# Patient Record
Sex: Female | Born: 1955
Health system: Southern US, Community
[De-identification: ages and names within clinical notes are randomized; demographics above are authoritative.]

## PROBLEM LIST (undated history)

## (undated) DIAGNOSIS — I10 Essential (primary) hypertension: Secondary | ICD-10-CM

## (undated) DIAGNOSIS — J45909 Unspecified asthma, uncomplicated: Secondary | ICD-10-CM

## (undated) DIAGNOSIS — G40909 Epilepsy, unspecified, not intractable, without status epilepticus: Secondary | ICD-10-CM

## (undated) DIAGNOSIS — E119 Type 2 diabetes mellitus without complications: Secondary | ICD-10-CM

## (undated) DIAGNOSIS — G4730 Sleep apnea, unspecified: Secondary | ICD-10-CM

## (undated) HISTORY — DX: Essential (primary) hypertension: I10

## (undated) HISTORY — DX: Type 2 diabetes mellitus without complications: E11.9

## (undated) HISTORY — DX: Sleep apnea, unspecified: G47.30

## (undated) HISTORY — DX: Epilepsy, unspecified, not intractable, without status epilepticus: G40.909

## (undated) HISTORY — DX: Unspecified asthma, uncomplicated: J45.909

## (undated) HISTORY — PX: CHOLECYSTECTOMY: SHX55

## (undated) HISTORY — PX: TONSILLECTOMY: SUR1361

## (undated) HISTORY — PX: APPENDECTOMY: SHX54

## (undated) MED FILL — Iron Sucrose Inj 20 MG/ML (Fe Equiv): INTRAVENOUS | Qty: 10 | Status: AC

---

## 2008-08-22 ENCOUNTER — Ambulatory Visit: Payer: Self-pay | Admitting: Vascular Surgery

## 2013-01-05 DIAGNOSIS — G40309 Generalized idiopathic epilepsy and epileptic syndromes, not intractable, without status epilepticus: Secondary | ICD-10-CM | POA: Insufficient documentation

## 2013-01-05 DIAGNOSIS — G40219 Localization-related (focal) (partial) symptomatic epilepsy and epileptic syndromes with complex partial seizures, intractable, without status epilepticus: Secondary | ICD-10-CM | POA: Insufficient documentation

## 2016-01-06 DIAGNOSIS — E119 Type 2 diabetes mellitus without complications: Secondary | ICD-10-CM | POA: Insufficient documentation

## 2016-01-06 DIAGNOSIS — E1165 Type 2 diabetes mellitus with hyperglycemia: Secondary | ICD-10-CM | POA: Insufficient documentation

## 2016-02-13 DIAGNOSIS — R001 Bradycardia, unspecified: Secondary | ICD-10-CM | POA: Insufficient documentation

## 2016-07-08 ENCOUNTER — Ambulatory Visit: Payer: Self-pay | Admitting: Allergy and Immunology

## 2016-07-10 ENCOUNTER — Encounter: Payer: Self-pay | Admitting: Allergy and Immunology

## 2016-07-10 ENCOUNTER — Ambulatory Visit (INDEPENDENT_AMBULATORY_CARE_PROVIDER_SITE_OTHER): Payer: Medicare HMO | Admitting: Allergy and Immunology

## 2016-07-10 VITALS — BP 150/98 | HR 76 | Temp 98.5°F | Resp 16 | Ht 65.7 in | Wt 244.0 lb

## 2016-07-10 DIAGNOSIS — K219 Gastro-esophageal reflux disease without esophagitis: Secondary | ICD-10-CM

## 2016-07-10 DIAGNOSIS — G4733 Obstructive sleep apnea (adult) (pediatric): Secondary | ICD-10-CM | POA: Diagnosis not present

## 2016-07-10 DIAGNOSIS — D721 Eosinophilia, unspecified: Secondary | ICD-10-CM

## 2016-07-10 DIAGNOSIS — J324 Chronic pansinusitis: Secondary | ICD-10-CM | POA: Diagnosis not present

## 2016-07-10 DIAGNOSIS — J3089 Other allergic rhinitis: Secondary | ICD-10-CM | POA: Diagnosis not present

## 2016-07-10 MED ORDER — MONTELUKAST SODIUM 10 MG PO TABS
ORAL_TABLET | ORAL | 5 refills | Status: DC
Start: 2016-07-10 — End: 2017-01-05

## 2016-07-10 MED ORDER — RANITIDINE HCL 300 MG PO TABS: ORAL_TABLET | ORAL | 5 refills | Status: DC

## 2016-07-10 NOTE — Progress Notes (Signed)
Dear Dr. Theda Sers,  Thank you for referring Alicia Kelly to the [facility] on 07/10/2016.   Below is a summation of this patient's evaluation and recommendations.  Thank you for your referral. I will keep you informed about this patient's response to treatment.   If you have any questions please do not hesitate to contact me.   Sincerely,  Jiles Prows, MD Allergy / Immunology [place]   ______________________________________________________________________    NEW PATIENT NOTE  Referring Provider: Janie Morning, DO Primary Provider: Janie Morning, DO Date of office visit: 07/10/2016    Subjective:   Chief Complaint:  Alicia Kelly (DOB: [birth year]) is a 61 y.o. female who presents to the clinic on 07/10/2016 with a chief complaint of Allergies .     HPI: Alicia Kelly presents to this clinic in evaluation of several different issues.  First, she has a history of "recurrent bronchitis" with unrelenting coughing spells occurring about 6 times per year requiring her to go to the urgent care center and receive antibiotics. There does not appear to be any wheezing or shortness of breath but rather these are just events associated with unrelenting coughing.  Second, she also has chronic postnasal drip and throat clearing and choking. Food appears to get hung up when she swallows. It is mostly a problem with transitioning food from her mouth into her esophagus as best as she can describe. She's been stuck in this pattern for several years. She did see Dr. Melina Copa who performed an upper endoscopy about 2-3 years ago and found that she had gastric polyps. She's been taking a proton pump inhibitor twice a day since that point in time but she still continues to have obvious regurgitation. She must run to the toilet twice a day and spit up. She drinks 2 caffeinated sodas per day and has chocolate  almost daily. She does not consume any alcohol.  Third, she recently had a rather significant episode of laryngitis over the course of the past several weeks with lots of throat clearing and phlegm and postnasal drip and strangling and choking. She went to the emergency room and was evaluated with a CT scan of her neck which identified sphenoid sinusitis. She has apparently been treated with antibiotics and is somewhat better regarding this issue at this point.  Fourth, she's been having a chronic headache in her forehead in her frontal region on almost a daily basis and this is been a long-standing issue. Maybe last year she only had a headache once or twice a week but now she has a daily headache. She complains about having pressure on her face and being stuffy with some nasal congestion. She can smell and taste with no problem and she does not have any ugly nasal discharge or ugly postnasal drip. She is scheduled to see an ENT doctor in a few weeks.  She has untreated sleep apnea. She did lose about 100 pounds of weight over the course of the past 4 years which has helped her somewhat. She scheduled to go to a sleep clinic sometime in the next few weeks.  Past Medical History:  Diagnosis Date  . Asthma   . Diabetes ([place])   . Epilepsy ([place])   . Hypertension   . Sleep apnea     Past Surgical History:  Procedure Laterality Date  . APPENDECTOMY    . TONSILLECTOMY      Allergies  as of 07/10/2016      Reactions   Levofloxacin Other (See Comments)   Throat swelling   Penicillins Anaphylaxis      Medication List      doxycycline 100 MG capsule Commonly known as:  MONODOX   fluticasone 50 MCG/ACT nasal spray Commonly known as:  FLONASE   levETIRAcetam 500 MG tablet Commonly known as:  KEPPRA   losartan-hydrochlorothiazide 100-25 MG tablet Commonly known as:  HYZAAR   metFORMIN 500 MG tablet Commonly known as:  GLUCOPHAGE   montelukast 10 MG tablet Commonly known as:   SINGULAIR Take one tablet once daily as directed   nystatin cream Commonly known as:  MYCOSTATIN   omeprazole 40 MG capsule Commonly known as:  PRILOSEC   potassium chloride 10 MEQ tablet Commonly known as:  K-DUR,KLOR-CON   ranitidine 300 MG tablet Commonly known as:  ZANTAC Take one tablet daily at bedtime   simvastatin 20 MG tablet Commonly known as:  ZOCOR       Review of systems negative except as noted in HPI / PMHx or noted below:  Review of Systems  Constitutional: Negative.   HENT: Negative.   Eyes: Negative.   Respiratory: Negative.   Cardiovascular: Negative.   Gastrointestinal: Negative.   Genitourinary: Negative.   Musculoskeletal: Negative.   Skin: Negative.        Rosacea treated with topical metronidazole.  Neurological: Negative.   Endo/Heme/Allergies: Negative.   Psychiatric/Behavioral: Negative.     Family History  Problem Relation Age of Onset  . Cancer Mother   . Hypertension Father   . Diabetes Father   . Allergic rhinitis Sister   . Hypertension Sister   . Cancer Maternal Uncle   . Cancer Maternal Grandmother   . Cancer Paternal Grandmother   . Diabetes Paternal Grandmother     Social History   Social History  . Marital status: Married    Spouse name: N/A  . Number of children: N/A  . Years of education: N/A   Occupational History  . Not on file.   Social History Main Topics  . Smoking status: Never Smoker  . Smokeless tobacco: Never Used  . Alcohol use Not on file  . Drug use: Unknown  . Sexual activity: Not on file   Other Topics Concern  . Not on file   Social History Narrative  . No narrative on file    Environmental and Social history  Lives in a apartment with a dry environment, a dog located inside the household, carpeting in the bedroom, no plastic on the bed or pillow, and no smoking ongoing with inside the household.  Objective:   Vitals:   07/10/16 0926  BP: (!) 150/98  Pulse: 76  Resp: 16    Temp: 98.5 F (36.9 C)   Height: 5' 5.7" (166.9 cm) Weight: 244 lb (110.7 kg)  Physical Exam  Constitutional: She is well-developed, well-nourished, and in no distress.  Throat clearing, slight cough, raspy voice  HENT:  Head: Normocephalic. Head is without right periorbital erythema and without left periorbital erythema.  Right Ear: Tympanic membrane, external ear and ear canal normal.  Left Ear: Tympanic membrane, external ear and ear canal normal.  Nose: Nose normal. No mucosal edema or rhinorrhea.  Mouth/Throat: Oropharynx is clear and moist and mucous membranes are normal. No oropharyngeal exudate.  Eyes: Conjunctivae and lids are normal. Pupils are equal, round, and reactive to light.  Neck: Trachea normal. No tracheal deviation present. No thyromegaly present.  Cardiovascular: Normal rate, regular rhythm, S1 normal, S2 normal and normal heart sounds.   No murmur heard. Pulmonary/Chest: Effort normal. No stridor. No tachypnea. No respiratory distress. She has no wheezes. She has no rales. She exhibits no tenderness.  Abdominal: Soft. She exhibits no distension and no mass. There is no hepatosplenomegaly. There is no tenderness. There is no rebound and no guarding.  Musculoskeletal: She exhibits no edema or tenderness.  Lymphadenopathy:       Head (right side): No tonsillar adenopathy present.       Head (left side): No tonsillar adenopathy present.    She has no cervical adenopathy.    She has no axillary adenopathy.  Neurological: She is alert. Gait normal.  Skin: Rash (facial erythema and telangiectasia) noted. She is not diaphoretic. No erythema. No pallor. Nails show no clubbing.  Psychiatric: Mood and affect normal.    Diagnostics: Allergy skin tests were performed. She did not demonstrate any significant hypersensitivity against a screening panel of aeroallergens or foods.  Spirometry was performed and demonstrated an FEV1 of 2.41 @ 86 % of predicted.  Results of a  CT scan of her neck obtained to February 2018 referred to sphenoid sinus air fluid levels.  Results of a chest x-ray dated to fibroid 2018 identified no significant abnormality.  Results of blood tests obtained to fibroid 2018 identified a white blood cell count of 11.9 which includes 500 eosinophils, hemoglobin 12.3, platelet 13.4, normal hepatic and renal function.   Assessment and Plan:    1. Other allergic rhinitis   2. Chronic pansinusitis   3. LPRD (laryngopharyngeal reflux disease)   4. Obstructive sleep apnea syndrome   5. Eosinophilia     1. Allergen avoidance measures  2. Treat and prevent inflammation:   A. Flonase one spray each nostril twice a day  B. montelukast 10 mg tablet 1 time per day  3. Treat and prevent reflux:   A. slowly taper off all caffeine and chocolate  B. continue omeprazole 40 mg twice a day  C. start ranitidine 300 mg in evening  4. If needed:   A. nasal saline  B. cetirizine 10 mg tablet 1 time per day  5. Obtain a modified barium swallow  6. Blood - IgA/G,M, IgE, anti-pneumococcal antibody, antitetanus antibody  7. Follow up with ENT evaluation in 2 weeks  8. Follow up with sleep study  9. Return to clinic in 2 weeks or earlier if problem  Alicia Kelly has several different issues that may or may not be interacting with each other. Certainly she appears to have chronic sinusitis and it is quite possible that some of her reflux disease is giving rise to this issue as she has uncontrolled reflux at this point even in the face of using a proton pump inhibitor twice a day. I've asked her to slowly taper off her caffeine and chocolate and start ranitidine in addition to her other medications. Of course, her chronic sinusitis and her history of recurrent "bronchitis" could be a manifestation of an inadequate immune system and will evaluate her for a B-cell defect as noted above. She certainly has a fair amount of either pharyngeal dyskinesia or  esophageal dysmotility giving rise to swallowing problems and will evaluate that further with a modified barium swallow to look at her swallowing mechanics. She also needs evaluation with an ENT doctor to look at the anatomy of her upper airway and she certainly needs to address the issue of untreated sleep apnea which will  hopefully be completed at some point in the near future. I'll see her back in this clinic in 2 weeks or earlier if there is a problem.  Jiles Prows, MD [facility]

## 2016-07-10 NOTE — Patient Instructions (Addendum)
  1. Allergen avoidance measures  2. Treat and prevent inflammation:   A. Flonase one spray each nostril twice a day  B. montelukast 10 mg tablet 1 time per day  3. Treat and prevent reflux:   A. slowly taper off all caffeine and chocolate  B. continue omeprazole 40 mg twice a day  C. start ranitidine 300 mg in evening  4. If needed:   A. nasal saline  B. cetirizine 10 mg tablet 1 time per day  5. Obtain a modified barium swallow  6. Blood - IgA/G,M, IgE, anti-pneumococcal antibody, antitetanus antibody  7. Follow up with ENT evaluation in 2 weeks  8. Follow up with sleep study  9. Return to clinic in 2 weeks or earlier if problem

## 2016-07-13 ENCOUNTER — Telehealth: Payer: Self-pay | Admitting: *Deleted

## 2016-07-13 NOTE — Telephone Encounter (Signed)
Request/Order for Barium Swallow was faxed to [facility] on 07/10/2016 and instructed them to contact patient to schedule.

## 2016-07-13 NOTE — Telephone Encounter (Signed)
-----   Message from Jiles Prows, MD sent at 07/13/2016  7:17 AM EST ----- Please contact patient today and ask if she is doing better. If so, continue plan. If not, start clindamycin 150 one tablet three times per day for the next 14 days.

## 2016-07-13 NOTE — Telephone Encounter (Signed)
Patient advised she was doing some better I advised per Dr Neldon Mc to continue with the plan he gave her

## 2016-07-13 NOTE — Telephone Encounter (Signed)
Tried to call patient again voicemail full unable to leave message

## 2016-07-13 NOTE — Telephone Encounter (Signed)
Tried to contact patient per Dr Neldon Mc but voicemail full unable to leave message

## 2016-07-15 ENCOUNTER — Encounter: Payer: Self-pay | Admitting: Allergy and Immunology

## 2016-07-27 ENCOUNTER — Telehealth: Payer: Self-pay | Admitting: *Deleted

## 2016-07-27 NOTE — Telephone Encounter (Signed)
Per Dr Neldon Mc patient advised [facility] labs and modified barium swallow okay. Will discuss at followup appt

## 2016-07-30 ENCOUNTER — Encounter: Payer: Self-pay | Admitting: Allergy and Immunology

## 2016-07-31 ENCOUNTER — Encounter: Payer: Self-pay | Admitting: Allergy and Immunology

## 2016-07-31 ENCOUNTER — Ambulatory Visit (INDEPENDENT_AMBULATORY_CARE_PROVIDER_SITE_OTHER): Payer: Medicare HMO | Admitting: Allergy and Immunology

## 2016-07-31 VITALS — BP 122/80 | HR 80 | Resp 18

## 2016-07-31 DIAGNOSIS — K219 Gastro-esophageal reflux disease without esophagitis: Secondary | ICD-10-CM | POA: Diagnosis not present

## 2016-07-31 DIAGNOSIS — J324 Chronic pansinusitis: Secondary | ICD-10-CM | POA: Diagnosis not present

## 2016-07-31 DIAGNOSIS — J3089 Other allergic rhinitis: Secondary | ICD-10-CM | POA: Diagnosis not present

## 2016-07-31 NOTE — Patient Instructions (Addendum)
  1. Allergen avoidance measures  2. Continue to Treat and prevent inflammation:   A. Flonase one spray each nostril twice a day  B. montelukast 10 mg tablet 1 time per day  3. Continue to Treat and prevent reflux:   A. remain off all caffeine and chocolate  B. omeprazole 40 mg twice a day  C. ranitidine 300 mg in evening  4. If needed:   A. nasal saline  B. cetirizine 10 mg tablet 1 time per day  5. Return in 9 weeks or earlier if problem

## 2016-07-31 NOTE — Progress Notes (Signed)
Follow-up Note  Referring Provider: Janie Morning, DO Primary Provider: Janie Morning, DO Date of Office Visit: 07/31/2016  Subjective:   Alicia Kelly (DOB: 01-09-1956) is a 61 y.o. female who [facility] 07[facility]2016 in re-evaluation of the following:  HPI: Alicia Kelly returns to this clinic in reevaluation of her respiratory tract symptoms believed secondary to a combination of reflux-induced respiratory disease and upper airway inflammation. I last saw her in this clinic during her initial evaluation of 10 July 2016.  She is much better. She's had a dramatic decrease in her cough and her throat clearing and her choking. She has resolved her recurrent laryngitis. She's been very good about consolidating all of her caffeine and chocolate consumption and she has continued to use a proton pump inhibitor and H2 receptor blocker as well as continuing on anti-inflammatory medications for her upper airways.  She did visit with an ENT doctor for evaluation of her upper airway complaints and she is scheduled to have a sinus CT scan performed at some point in the near future.  There was an issue with her developing a seizure apparently while using an antibiotic for a urinary tract infection. She does have a history of epilepsy but this has been very inactive for a prolonged period in time until she utilize that antibiotic.  Her headaches are a lot better at this point although she still continues to have some headaches. They may be somewhat better regarding both intensity and frequency.  Apparently she is holding off on having any form of evaluation for sleep apnea until she gets everything else completed regarding her respiratory tract issue. It should be noted that she did have documented untreated sleep apnea but did lose 100 pounds of weight over the course of the past 4 years which has made a significant improvement regarding her sleep cycle.  Allergies as of  07/31/2016      Reactions   Levofloxacin Other (See Comments)   Throat swelling   Penicillins Anaphylaxis      Medication List      doxycycline 100 MG capsule Commonly known as:  MONODOX   fluticasone 50 MCG/ACT nasal spray Commonly known as:  FLONASE   levETIRAcetam 500 MG tablet Commonly known as:  KEPPRA   losartan-hydrochlorothiazide 100-25 MG tablet Commonly known as:  HYZAAR   metFORMIN 500 MG tablet Commonly known as:  GLUCOPHAGE   montelukast 10 MG tablet Commonly known as:  SINGULAIR Take one tablet once daily as directed   nystatin cream Commonly known as:  MYCOSTATIN   omeprazole 40 MG capsule Commonly known as:  PRILOSEC   potassium chloride 10 MEQ tablet Commonly known as:  K-DUR,KLOR-CON   ranitidine 300 MG tablet Commonly known as:  ZANTAC Take one tablet daily at bedtime   simvastatin 20 MG tablet Commonly known as:  ZOCOR       Past Medical History:  Diagnosis Date  . Asthma   . Diabetes ([place])   . Epilepsy ([place])   . Hypertension   . Sleep apnea     Past Surgical History:  Procedure Laterality Date  . APPENDECTOMY    . TONSILLECTOMY      Review of systems negative except as noted in HPI / PMHx or noted below:  Review of Systems  Constitutional: Negative.   HENT: Negative.   Eyes: Negative.   Respiratory: Negative.   Cardiovascular: Negative.   Gastrointestinal: Negative.   Genitourinary: Negative.   Musculoskeletal: Negative.  Skin: Negative.   Neurological: Negative.   Endo/Heme/Allergies: Negative.   Psychiatric/Behavioral: Negative.      Objective:   Vitals:   07/31/16 1104  BP: 122/80  Pulse: 80  Resp: 18          Physical Exam  Constitutional: She is well-developed, well-nourished, and in no distress.  HENT:  Head: Normocephalic.  Right Ear: Tympanic membrane, external ear and ear canal normal.  Left Ear: Tympanic membrane, external ear and ear canal normal.  Nose: Nose normal. No mucosal edema or  rhinorrhea.  Mouth/Throat: Uvula is midline, oropharynx is clear and moist and mucous membranes are normal. No oropharyngeal exudate.  Eyes: Conjunctivae are normal.  Neck: Trachea normal. No tracheal tenderness present. No tracheal deviation present. No thyromegaly present.  Cardiovascular: Normal rate, regular rhythm, S1 normal, S2 normal and normal heart sounds.   No murmur heard. Pulmonary/Chest: Breath sounds normal. No stridor. No respiratory distress. She has no wheezes. She has no rales.  Musculoskeletal: She exhibits no edema.  Lymphadenopathy:       Head (right side): No tonsillar adenopathy present.       Head (left side): No tonsillar adenopathy present.    She has no cervical adenopathy.  Neurological: She is alert. Gait normal.  Skin: No rash noted. She is not diaphoretic. No erythema. Nails show no clubbing.  Psychiatric: Mood and affect normal.    Diagnostics: Results of blood tests obtained 10 July 2016 identified a IgG 882, IgA 131, IgM 103 MG/DL, adequate antibodies directed against tetanus, and several serotypes of pneumococcus with adequate antibodies levels.  A modified barium swallow obtained 07/22/2016 identified no significant abnormality.   Assessment and Plan:   1. Other allergic rhinitis   2. Chronic pansinusitis   3. LPRD (laryngopharyngeal reflux disease)     1. Allergen avoidance measures  2. Continue to Treat and prevent inflammation:   A. Flonase one spray each nostril twice a day  B. montelukast 10 mg tablet 1 time per day  3. Continue to Treat and prevent reflux:   A. Remain off all caffeine and chocolate  B. omeprazole 40 mg twice a day  C. ranitidine 300 mg in evening  4. If needed:   A. nasal saline  B. cetirizine 10 mg tablet 1 time per day  5. Return in 9 weeks or earlier if problem  Overall Alicia Kelly is better and she will continue to use anti-inflammatory medications for her respiratory tract and aggressive therapy directed  against reflux and LPR. She will follow up with ENT regarding further management of her history of chronic sinusitis. I will see her back in this clinic at the end of 12 weeks of therapy and thus she will return in 9 weeks. Further evaluation and treatment will be based upon her response.  Allena Katz, MD Allergy / Immunology [place]

## 2016-10-06 ENCOUNTER — Telehealth: Payer: Self-pay | Admitting: Allergy and Immunology

## 2016-10-06 NOTE — Telephone Encounter (Signed)
Called patient and informed her to relax and we will see her after her Post-op period.  She will call once her doctor releases her so she can make a follow up appoint with Dr. Neldon Mc.

## 2016-10-06 NOTE — Telephone Encounter (Signed)
[name] called in and wanted to inform Dr. Neldon Mc  That she had surgery last Thursday.  Alicia Kelly is still having blood clots coming from her nose and was due for an appointment with Dr. Neldon Mc.  [name] is wondering if Dr. Neldon Mc would still like to see her while this is happening or does he want to see her once the nosebleeds clear up?

## 2016-10-06 NOTE — Telephone Encounter (Signed)
Please inform patient that she can wait for an appointment until she clears up her post - op period.

## 2016-11-29 DIAGNOSIS — G4730 Sleep apnea, unspecified: Secondary | ICD-10-CM | POA: Insufficient documentation

## 2016-11-29 DIAGNOSIS — K219 Gastro-esophageal reflux disease without esophagitis: Secondary | ICD-10-CM | POA: Insufficient documentation

## 2017-01-05 ENCOUNTER — Other Ambulatory Visit: Payer: Self-pay | Admitting: Allergy and Immunology

## 2017-01-07 DIAGNOSIS — M5417 Radiculopathy, lumbosacral region: Secondary | ICD-10-CM | POA: Insufficient documentation

## 2017-01-16 DIAGNOSIS — R569 Unspecified convulsions: Secondary | ICD-10-CM | POA: Diagnosis not present

## 2017-01-16 DIAGNOSIS — E119 Type 2 diabetes mellitus without complications: Secondary | ICD-10-CM | POA: Diagnosis not present

## 2017-01-16 DIAGNOSIS — I10 Essential (primary) hypertension: Secondary | ICD-10-CM

## 2017-01-17 DIAGNOSIS — I10 Essential (primary) hypertension: Secondary | ICD-10-CM | POA: Diagnosis not present

## 2017-01-17 DIAGNOSIS — R569 Unspecified convulsions: Secondary | ICD-10-CM | POA: Diagnosis not present

## 2017-01-17 DIAGNOSIS — E119 Type 2 diabetes mellitus without complications: Secondary | ICD-10-CM | POA: Diagnosis not present

## 2017-01-18 DIAGNOSIS — E119 Type 2 diabetes mellitus without complications: Secondary | ICD-10-CM | POA: Diagnosis not present

## 2017-01-18 DIAGNOSIS — I10 Essential (primary) hypertension: Secondary | ICD-10-CM | POA: Diagnosis not present

## 2017-01-18 DIAGNOSIS — R569 Unspecified convulsions: Secondary | ICD-10-CM | POA: Diagnosis not present

## 2017-01-19 DIAGNOSIS — E119 Type 2 diabetes mellitus without complications: Secondary | ICD-10-CM | POA: Diagnosis not present

## 2017-01-19 DIAGNOSIS — R569 Unspecified convulsions: Secondary | ICD-10-CM | POA: Diagnosis not present

## 2017-01-19 DIAGNOSIS — I10 Essential (primary) hypertension: Secondary | ICD-10-CM | POA: Diagnosis not present

## 2017-01-28 ENCOUNTER — Ambulatory Visit: Payer: Medicare HMO | Admitting: Allergy and Immunology

## 2017-02-04 ENCOUNTER — Encounter: Payer: Self-pay | Admitting: Allergy and Immunology

## 2017-02-04 ENCOUNTER — Ambulatory Visit (INDEPENDENT_AMBULATORY_CARE_PROVIDER_SITE_OTHER): Payer: Medicare Other | Admitting: Allergy and Immunology

## 2017-02-04 VITALS — BP 114/74 | HR 76 | Resp 20

## 2017-02-04 DIAGNOSIS — J3089 Other allergic rhinitis: Secondary | ICD-10-CM | POA: Diagnosis not present

## 2017-02-04 DIAGNOSIS — J324 Chronic pansinusitis: Secondary | ICD-10-CM

## 2017-02-04 DIAGNOSIS — K219 Gastro-esophageal reflux disease without esophagitis: Secondary | ICD-10-CM

## 2017-02-04 NOTE — Patient Instructions (Addendum)
  1. Follow up with Dr. Gaylyn Cheers. May need repeat sinus CT scan if still with problems  2. Continue to Treat and prevent inflammation:   A. Flonase one spray each nostril twice a day  B. montelukast 10 mg tablet 1 time per day  3. Continue to Treat and prevent reflux:   A. remain off all caffeine and chocolate  B. omeprazole 40 mg twice a day  C. ranitidine 300 mg in evening  4. If needed:   A. nasal saline  B. cetirizine 10 mg tablet 1 time per day  5. Return in 6 month or earlier if problem  6. Obtain fall flu vaccine

## 2017-02-04 NOTE — Progress Notes (Signed)
Follow-up Note  Referring Provider: Janie Morning, DO Primary Provider: Algis Greenhouse, MD Date of Office Visit: 02/04/2017  Subjective:   Alicia Kelly (DOB: 01/31/1956) is a 61 y.o. female who returns to the [facility] on 02/04/2017 in re-evaluation of the following:  HPI: Alicia Kelly returns to this clinic in reevaluation of her reflux-induced respiratory disease and upper airway disease. Her last visit to this clinic was March 2018.  During the interval she had sinus surgery with Dr. Gaylyn Cheers. She still continues to have this sensation that there is crusty material up in her nose since that surgery. Recently she was given clarithromycin and apparently this triggered off her pre-existing seizure disorder.  Since starting medical therapy she has completely resolved her cough and the issue with her throat. She does not have any reflux symptoms.  Allergies as of 02/04/2017      Reactions   Levofloxacin Other (See Comments)   Throat swelling   Penicillins Anaphylaxis   Clarithromycin Other (See Comments)   seizures   Sulfamethoxazole-trimethoprim    Causes seizures      Medication List      amLODipine 10 MG tablet Commonly known as:  NORVASC   clotrimazole-betamethasone cream Commonly known as:  LOTRISONE   doxycycline 100 MG capsule Commonly known as:  MONODOX   fluticasone 50 MCG/ACT nasal spray Commonly known as:  FLONASE   irbesartan-hydrochlorothiazide 150-12.5 MG tablet Commonly known as:  AVALIDE   levETIRAcetam 500 MG tablet Commonly known as:  KEPPRA   metFORMIN 500 MG tablet Commonly known as:  GLUCOPHAGE   montelukast 10 MG tablet Commonly known as:  SINGULAIR TAKE 1 TABLET BY MOUTH ONCE DAILY AS DIRECTED   nystatin cream Commonly known as:  MYCOSTATIN   omeprazole 40 MG capsule Commonly known as:  PRILOSEC Take 40 mg by mouth 2 (two) times daily.   potassium chloride 10 MEQ tablet Commonly known as:  K-DUR,KLOR-CON   ranitidine  300 MG tablet Commonly known as:  ZANTAC TAKE 1 TABLET BY MOUTH NIGHTLY AT BEDTIME   simvastatin 20 MG tablet Commonly known as:  ZOCOR   VIMPAT 200 MG Tabs tablet Generic drug:  lacosamide       Past Medical History:  Diagnosis Date  . Asthma   . Diabetes ([place])   . Epilepsy ([place])   . Hypertension   . Sleep apnea     Past Surgical History:  Procedure Laterality Date  . APPENDECTOMY    . TONSILLECTOMY      Review of systems negative except as noted in HPI / PMHx or noted below:  Review of Systems  Constitutional: Negative.   HENT: Negative.   Eyes: Negative.   Respiratory: Negative.   Cardiovascular: Negative.   Gastrointestinal: Negative.   Genitourinary: Negative.   Musculoskeletal: Negative.   Skin: Negative.   Neurological: Negative.   Endo/Heme/Allergies: Negative.   Psychiatric/Behavioral: Negative.      Objective:   Vitals:   02/04/17 1135  BP: 114/74  Pulse: 76  Resp: 20          Physical Exam  Constitutional: She is well-developed, well-nourished, and in no distress.  HENT:  Head: Normocephalic.  Right Ear: Tympanic membrane, external ear and ear canal normal.  Left Ear: Tympanic membrane, external ear and ear canal normal.  Nose: Nose normal. No mucosal edema or rhinorrhea.  Mouth/Throat: Uvula is midline, oropharynx is clear and moist and mucous membranes are normal. No oropharyngeal exudate.  Eyes: Conjunctivae are  normal.  Neck: Trachea normal. No tracheal tenderness present. No tracheal deviation present. No thyromegaly present.  Cardiovascular: Normal rate, regular rhythm, S1 normal, S2 normal and normal heart sounds.   No murmur heard. Pulmonary/Chest: Breath sounds normal. No stridor. No respiratory distress. She has no wheezes. She has no rales.  Musculoskeletal: She exhibits no edema.  Lymphadenopathy:       Head (right side): No tonsillar adenopathy present.       Head (left side): No tonsillar adenopathy present.    She has  no cervical adenopathy.  Neurological: She is alert. Gait normal.  Skin: No rash noted. She is not diaphoretic. No erythema. Nails show no clubbing.  Psychiatric: Mood and affect normal.    Diagnostics: none  Assessment and Plan:   1. Other allergic rhinitis   2. Chronic pansinusitis   3. LPRD (laryngopharyngeal reflux disease)     1. Follow up with Dr. Gaylyn Cheers. May need repeat sinus CT scan if still with problems  2. Continue to Treat and prevent inflammation:   A. Flonase one spray each nostril twice a day  B. montelukast 10 mg tablet 1 time per day  3. Continue to Treat and prevent reflux:   A. remain off all caffeine and chocolate  B. omeprazole 40 mg twice a day  C. ranitidine 300 mg in evening  4. If needed:   A. nasal saline  B. cetirizine 10 mg tablet 1 time per day  5. Return in 6 month or earlier if problem  6. Obtain fall flu vaccine  Alicia Kelly is doing quite well regarding her cough which appeared to be predominantly an issue with reflux-induced respiratory disease and we will continue to have her use the treatment mentioned above and see her in this clinic in 6 months or earlier if there is a problem. She will follow-up with Dr. Gaylyn Cheers concerning further management of her upper airway disease.  Allena Katz, MD Allergy / Immunology [place]

## 2017-03-08 DIAGNOSIS — R1010 Upper abdominal pain, unspecified: Secondary | ICD-10-CM | POA: Insufficient documentation

## 2017-03-22 DIAGNOSIS — K8010 Calculus of gallbladder with chronic cholecystitis without obstruction: Secondary | ICD-10-CM

## 2017-03-22 HISTORY — DX: Calculus of gallbladder with chronic cholecystitis without obstruction: K80.10

## 2017-04-30 DIAGNOSIS — Z780 Asymptomatic menopausal state: Secondary | ICD-10-CM | POA: Insufficient documentation

## 2017-05-31 DIAGNOSIS — J309 Allergic rhinitis, unspecified: Secondary | ICD-10-CM | POA: Insufficient documentation

## 2017-06-09 ENCOUNTER — Other Ambulatory Visit: Payer: Self-pay | Admitting: Allergy and Immunology

## 2017-07-05 ENCOUNTER — Other Ambulatory Visit: Payer: Self-pay | Admitting: Allergy and Immunology

## 2017-07-05 NOTE — Telephone Encounter (Signed)
Patient due for office visit in March.

## 2017-08-26 DIAGNOSIS — E7800 Pure hypercholesterolemia, unspecified: Secondary | ICD-10-CM | POA: Diagnosis not present

## 2017-08-26 DIAGNOSIS — R569 Unspecified convulsions: Secondary | ICD-10-CM | POA: Diagnosis not present

## 2017-08-26 DIAGNOSIS — I10 Essential (primary) hypertension: Secondary | ICD-10-CM | POA: Diagnosis not present

## 2017-08-26 DIAGNOSIS — E119 Type 2 diabetes mellitus without complications: Secondary | ICD-10-CM | POA: Diagnosis not present

## 2017-09-08 DIAGNOSIS — R21 Rash and other nonspecific skin eruption: Secondary | ICD-10-CM | POA: Diagnosis not present

## 2017-09-16 DIAGNOSIS — R21 Rash and other nonspecific skin eruption: Secondary | ICD-10-CM | POA: Diagnosis not present

## 2017-09-16 DIAGNOSIS — B372 Candidiasis of skin and nail: Secondary | ICD-10-CM | POA: Diagnosis not present

## 2017-09-16 DIAGNOSIS — J0100 Acute maxillary sinusitis, unspecified: Secondary | ICD-10-CM | POA: Diagnosis not present

## 2017-09-22 DIAGNOSIS — E119 Type 2 diabetes mellitus without complications: Secondary | ICD-10-CM | POA: Diagnosis not present

## 2017-09-22 DIAGNOSIS — J349 Unspecified disorder of nose and nasal sinuses: Secondary | ICD-10-CM | POA: Insufficient documentation

## 2017-09-22 DIAGNOSIS — J341 Cyst and mucocele of nose and nasal sinus: Secondary | ICD-10-CM | POA: Diagnosis not present

## 2017-09-27 DIAGNOSIS — J019 Acute sinusitis, unspecified: Secondary | ICD-10-CM | POA: Diagnosis not present

## 2017-10-22 DIAGNOSIS — L2089 Other atopic dermatitis: Secondary | ICD-10-CM | POA: Diagnosis not present

## 2017-11-11 DIAGNOSIS — R109 Unspecified abdominal pain: Secondary | ICD-10-CM | POA: Diagnosis not present

## 2017-11-11 DIAGNOSIS — I10 Essential (primary) hypertension: Secondary | ICD-10-CM | POA: Diagnosis not present

## 2017-11-11 DIAGNOSIS — M7989 Other specified soft tissue disorders: Secondary | ICD-10-CM | POA: Diagnosis not present

## 2017-11-11 DIAGNOSIS — R609 Edema, unspecified: Secondary | ICD-10-CM | POA: Diagnosis not present

## 2017-11-11 DIAGNOSIS — R52 Pain, unspecified: Secondary | ICD-10-CM | POA: Diagnosis not present

## 2017-11-11 DIAGNOSIS — K429 Umbilical hernia without obstruction or gangrene: Secondary | ICD-10-CM | POA: Diagnosis not present

## 2017-11-11 DIAGNOSIS — R1031 Right lower quadrant pain: Secondary | ICD-10-CM | POA: Diagnosis not present

## 2017-11-16 DIAGNOSIS — M549 Dorsalgia, unspecified: Secondary | ICD-10-CM | POA: Diagnosis not present

## 2017-11-16 DIAGNOSIS — R109 Unspecified abdominal pain: Secondary | ICD-10-CM | POA: Diagnosis not present

## 2017-11-29 DIAGNOSIS — K219 Gastro-esophageal reflux disease without esophagitis: Secondary | ICD-10-CM | POA: Diagnosis not present

## 2017-11-29 DIAGNOSIS — E119 Type 2 diabetes mellitus without complications: Secondary | ICD-10-CM | POA: Diagnosis not present

## 2017-12-01 ENCOUNTER — Ambulatory Visit: Payer: Medicare Other | Admitting: Allergy and Immunology

## 2017-12-13 ENCOUNTER — Encounter: Payer: Self-pay | Admitting: Allergy and Immunology

## 2017-12-13 ENCOUNTER — Ambulatory Visit (INDEPENDENT_AMBULATORY_CARE_PROVIDER_SITE_OTHER): Payer: Medicare Other | Admitting: Allergy and Immunology

## 2017-12-13 VITALS — BP 118/80 | HR 74 | Resp 18

## 2017-12-13 DIAGNOSIS — J3089 Other allergic rhinitis: Secondary | ICD-10-CM | POA: Diagnosis not present

## 2017-12-13 DIAGNOSIS — K219 Gastro-esophageal reflux disease without esophagitis: Secondary | ICD-10-CM

## 2017-12-13 NOTE — Progress Notes (Signed)
Follow-up Note  Referring Provider: Algis Greenhouse, MD Primary Provider: Nona Kelly, Alicia Cornea, MD Date of Office Visit: 12/13/2017  Subjective:   Alicia Kelly (DOB: 1955-06-29) is a 62 y.o. female who returns to the [facility] on 12/13/2017 in re-evaluation of the following:  HPI: Alicia Kelly returns to this clinic in reevaluation of her reflux induced respiratory disease and upper airway problem.  Her last visit to this clinic was 04 February 2017.  Overall she feels she is doing relatively well at this point in time with her reflux induced cough.  She rarely has any cough.  She still continues to use therapy directed against reflux but apparently her treatment has been changed since her last visit in this clinic.  She does not really know the details of the change but overall feels as though she is doing well.  She did visit with a [place] [facility] doctor as apparently she was not satisfied with the response that she received with Dr. Gaylyn Kelly.  Dr. Gaylyn Kelly had performed sinus surgery over a year ago.  The [facility] ENT doctor gave her a nasal antibiotic wash which she thinks did help her.  At this point she is just using nasal saline as well as montelukast and a nasal steroid.  Overall she is satisfied with the response that she has received with this form of treatment.  Allergies as of 12/13/2017      Reactions   Levofloxacin Other (See Comments)   Throat swelling   Penicillins Anaphylaxis   Clarithromycin Other (See Comments)   seizures   Sulfamethoxazole-trimethoprim    Causes seizures      Medication List      amLODipine 10 MG tablet Commonly known as:  NORVASC   cetirizine 10 MG tablet Commonly known as:  ZYRTEC Take 10 mg by mouth daily as needed.   clotrimazole-betamethasone cream Commonly known as:  LOTRISONE   doxycycline 100 MG capsule Commonly known as:  MONODOX   fluticasone 50 MCG/ACT nasal spray Commonly known as:  FLONASE     irbesartan-hydrochlorothiazide 150-12.5 MG tablet Commonly known as:  AVALIDE   levETIRAcetam 500 MG tablet Commonly known as:  KEPPRA   metFORMIN 500 MG tablet Commonly known as:  GLUCOPHAGE   montelukast 10 MG tablet Commonly known as:  SINGULAIR TAKE ONE TABLET BY MOUTH DAILY AS DIRECTED   nystatin cream Commonly known as:  MYCOSTATIN   omeprazole 40 MG capsule Commonly known as:  PRILOSEC Take 40 mg by mouth 2 (two) times daily.   potassium chloride 10 MEQ tablet Commonly known as:  K-DUR,KLOR-CON   simvastatin 20 MG tablet Commonly known as:  ZOCOR   VIMPAT 200 MG Tabs tablet Generic drug:  lacosamide       Past Medical History:  Diagnosis Date  . Asthma   . Diabetes ([place])   . Epilepsy ([place])   . Hypertension   . Sleep apnea     Past Surgical History:  Procedure Laterality Date  . APPENDECTOMY    . TONSILLECTOMY      Review of systems negative except as noted in HPI / PMHx or noted below:  Review of Systems  Constitutional: Negative.   HENT: Negative.   Eyes: Negative.   Respiratory: Negative.   Cardiovascular: Negative.   Gastrointestinal: Negative.   Genitourinary: Negative.   Musculoskeletal: Negative.   Skin: Negative.   Neurological: Negative.   Endo/Heme/Allergies: Negative.   Psychiatric/Behavioral: Negative.      Objective:  Vitals:   12/13/17 1048  BP: 118/80  Pulse: 74  Resp: 18          Physical Exam  HENT:  Head: Normocephalic.  Right Ear: Tympanic membrane, external ear and ear canal normal.  Left Ear: Tympanic membrane, external ear and ear canal normal.  Nose: Nose normal. No mucosal edema or rhinorrhea.  Mouth/Throat: Uvula is midline, oropharynx is clear and moist and mucous membranes are normal. No oropharyngeal exudate.  Eyes: Conjunctivae are normal.  Neck: Trachea normal. No tracheal tenderness present. No tracheal deviation present. No thyromegaly present.  Cardiovascular: Normal rate, regular rhythm, S1  normal, S2 normal and normal heart sounds.  No murmur heard. Pulmonary/Chest: Breath sounds normal. No stridor. No respiratory distress. She has no wheezes. She has no rales.  Musculoskeletal: She exhibits no edema.  Lymphadenopathy:       Head (right side): No tonsillar adenopathy present.       Head (left side): No tonsillar adenopathy present.    She has no cervical adenopathy.  Neurological: She is alert.  Skin: No rash noted. She is not diaphoretic. No erythema. Nails show no clubbing.    Diagnostics: none  Assessment and Plan:   1. Other allergic rhinitis   2. LPRD (laryngopharyngeal reflux disease)    1. Continue to Treat and prevent inflammation:   A. Flonase one spray each nostril twice a day  B. montelukast 10 mg tablet 1 time per day  2. Continue to Treat and prevent reflux with primary care doctor and Dr. Melina Kelly  3. If needed:   A. nasal saline  B. cetirizine 10 mg tablet 1 time per day  4. Return in 6 month or earlier if problem  5. Obtain fall flu vaccine  Alicia Kelly appears to be doing quite well on her current plan. Assuming she continues to do well I will see her back in this clinic in 6 months or earlier if there is a problem.  Alicia Katz, MD Allergy / Immunology [place]

## 2017-12-13 NOTE — Patient Instructions (Addendum)
  1. Continue to Treat and prevent inflammation:   A. Flonase one spray each nostril twice a day  B. montelukast 10 mg tablet 1 time per day  2. Continue to Treat and prevent reflux with primary care doctor and Dr. Melina Copa  3. If needed:   A. nasal saline  B. cetirizine 10 mg tablet 1 time per day  4. Return in 6 month or earlier if problem  5. Obtain fall flu vaccine

## 2017-12-14 ENCOUNTER — Encounter: Payer: Self-pay | Admitting: Allergy and Immunology

## 2018-01-14 DIAGNOSIS — J329 Chronic sinusitis, unspecified: Secondary | ICD-10-CM | POA: Diagnosis not present

## 2018-02-18 DIAGNOSIS — J329 Chronic sinusitis, unspecified: Secondary | ICD-10-CM | POA: Diagnosis not present

## 2018-03-07 DIAGNOSIS — G4733 Obstructive sleep apnea (adult) (pediatric): Secondary | ICD-10-CM | POA: Diagnosis not present

## 2018-03-07 DIAGNOSIS — E119 Type 2 diabetes mellitus without complications: Secondary | ICD-10-CM | POA: Diagnosis not present

## 2018-03-07 DIAGNOSIS — Z23 Encounter for immunization: Secondary | ICD-10-CM | POA: Diagnosis not present

## 2018-03-15 DIAGNOSIS — L0232 Furuncle of buttock: Secondary | ICD-10-CM | POA: Diagnosis not present

## 2018-03-23 DIAGNOSIS — L89313 Pressure ulcer of right buttock, stage 3: Secondary | ICD-10-CM | POA: Diagnosis not present

## 2018-03-23 DIAGNOSIS — E7800 Pure hypercholesterolemia, unspecified: Secondary | ICD-10-CM | POA: Diagnosis not present

## 2018-03-23 DIAGNOSIS — Z9989 Dependence on other enabling machines and devices: Secondary | ICD-10-CM | POA: Diagnosis not present

## 2018-03-23 DIAGNOSIS — G40909 Epilepsy, unspecified, not intractable, without status epilepticus: Secondary | ICD-10-CM | POA: Diagnosis not present

## 2018-03-23 DIAGNOSIS — M1990 Unspecified osteoarthritis, unspecified site: Secondary | ICD-10-CM | POA: Diagnosis not present

## 2018-03-23 DIAGNOSIS — I10 Essential (primary) hypertension: Secondary | ICD-10-CM | POA: Diagnosis not present

## 2018-03-23 DIAGNOSIS — E119 Type 2 diabetes mellitus without complications: Secondary | ICD-10-CM | POA: Diagnosis not present

## 2018-03-23 DIAGNOSIS — E6609 Other obesity due to excess calories: Secondary | ICD-10-CM | POA: Diagnosis not present

## 2018-03-23 DIAGNOSIS — G4730 Sleep apnea, unspecified: Secondary | ICD-10-CM | POA: Diagnosis not present

## 2018-03-25 DIAGNOSIS — K219 Gastro-esophageal reflux disease without esophagitis: Secondary | ICD-10-CM | POA: Diagnosis not present

## 2018-03-25 DIAGNOSIS — G4733 Obstructive sleep apnea (adult) (pediatric): Secondary | ICD-10-CM | POA: Diagnosis not present

## 2018-03-25 DIAGNOSIS — G40909 Epilepsy, unspecified, not intractable, without status epilepticus: Secondary | ICD-10-CM | POA: Diagnosis not present

## 2018-03-25 DIAGNOSIS — Z6835 Body mass index (BMI) 35.0-35.9, adult: Secondary | ICD-10-CM | POA: Diagnosis not present

## 2018-03-25 DIAGNOSIS — M1991 Primary osteoarthritis, unspecified site: Secondary | ICD-10-CM | POA: Diagnosis not present

## 2018-03-25 DIAGNOSIS — E119 Type 2 diabetes mellitus without complications: Secondary | ICD-10-CM | POA: Diagnosis not present

## 2018-03-25 DIAGNOSIS — E6609 Other obesity due to excess calories: Secondary | ICD-10-CM | POA: Diagnosis not present

## 2018-03-25 DIAGNOSIS — I10 Essential (primary) hypertension: Secondary | ICD-10-CM | POA: Diagnosis not present

## 2018-03-25 DIAGNOSIS — L89313 Pressure ulcer of right buttock, stage 3: Secondary | ICD-10-CM | POA: Diagnosis not present

## 2018-03-28 DIAGNOSIS — Z6835 Body mass index (BMI) 35.0-35.9, adult: Secondary | ICD-10-CM | POA: Diagnosis not present

## 2018-03-28 DIAGNOSIS — I10 Essential (primary) hypertension: Secondary | ICD-10-CM | POA: Diagnosis not present

## 2018-03-28 DIAGNOSIS — K219 Gastro-esophageal reflux disease without esophagitis: Secondary | ICD-10-CM | POA: Diagnosis not present

## 2018-03-28 DIAGNOSIS — L89313 Pressure ulcer of right buttock, stage 3: Secondary | ICD-10-CM | POA: Diagnosis not present

## 2018-03-28 DIAGNOSIS — M1991 Primary osteoarthritis, unspecified site: Secondary | ICD-10-CM | POA: Diagnosis not present

## 2018-03-28 DIAGNOSIS — E119 Type 2 diabetes mellitus without complications: Secondary | ICD-10-CM | POA: Diagnosis not present

## 2018-03-28 DIAGNOSIS — G4733 Obstructive sleep apnea (adult) (pediatric): Secondary | ICD-10-CM | POA: Diagnosis not present

## 2018-03-28 DIAGNOSIS — E6609 Other obesity due to excess calories: Secondary | ICD-10-CM | POA: Diagnosis not present

## 2018-03-28 DIAGNOSIS — G40909 Epilepsy, unspecified, not intractable, without status epilepticus: Secondary | ICD-10-CM | POA: Diagnosis not present

## 2018-03-31 DIAGNOSIS — L89313 Pressure ulcer of right buttock, stage 3: Secondary | ICD-10-CM | POA: Diagnosis not present

## 2018-03-31 DIAGNOSIS — Z6837 Body mass index (BMI) 37.0-37.9, adult: Secondary | ICD-10-CM | POA: Diagnosis not present

## 2018-03-31 DIAGNOSIS — I10 Essential (primary) hypertension: Secondary | ICD-10-CM | POA: Diagnosis not present

## 2018-03-31 DIAGNOSIS — E119 Type 2 diabetes mellitus without complications: Secondary | ICD-10-CM | POA: Diagnosis not present

## 2018-03-31 DIAGNOSIS — E6609 Other obesity due to excess calories: Secondary | ICD-10-CM | POA: Diagnosis not present

## 2018-04-04 DIAGNOSIS — E119 Type 2 diabetes mellitus without complications: Secondary | ICD-10-CM | POA: Diagnosis not present

## 2018-04-04 DIAGNOSIS — I10 Essential (primary) hypertension: Secondary | ICD-10-CM | POA: Diagnosis not present

## 2018-04-04 DIAGNOSIS — K219 Gastro-esophageal reflux disease without esophagitis: Secondary | ICD-10-CM | POA: Diagnosis not present

## 2018-04-04 DIAGNOSIS — G4733 Obstructive sleep apnea (adult) (pediatric): Secondary | ICD-10-CM | POA: Diagnosis not present

## 2018-04-04 DIAGNOSIS — M1991 Primary osteoarthritis, unspecified site: Secondary | ICD-10-CM | POA: Diagnosis not present

## 2018-04-04 DIAGNOSIS — Z6835 Body mass index (BMI) 35.0-35.9, adult: Secondary | ICD-10-CM | POA: Diagnosis not present

## 2018-04-04 DIAGNOSIS — L89313 Pressure ulcer of right buttock, stage 3: Secondary | ICD-10-CM | POA: Diagnosis not present

## 2018-04-04 DIAGNOSIS — E6609 Other obesity due to excess calories: Secondary | ICD-10-CM | POA: Diagnosis not present

## 2018-04-04 DIAGNOSIS — G40909 Epilepsy, unspecified, not intractable, without status epilepticus: Secondary | ICD-10-CM | POA: Diagnosis not present

## 2018-04-05 DIAGNOSIS — Z6837 Body mass index (BMI) 37.0-37.9, adult: Secondary | ICD-10-CM | POA: Diagnosis not present

## 2018-04-05 DIAGNOSIS — L89313 Pressure ulcer of right buttock, stage 3: Secondary | ICD-10-CM | POA: Diagnosis not present

## 2018-04-05 DIAGNOSIS — I10 Essential (primary) hypertension: Secondary | ICD-10-CM | POA: Diagnosis not present

## 2018-04-05 DIAGNOSIS — E119 Type 2 diabetes mellitus without complications: Secondary | ICD-10-CM | POA: Diagnosis not present

## 2018-04-05 DIAGNOSIS — E6609 Other obesity due to excess calories: Secondary | ICD-10-CM | POA: Diagnosis not present

## 2018-04-08 DIAGNOSIS — M1991 Primary osteoarthritis, unspecified site: Secondary | ICD-10-CM | POA: Diagnosis not present

## 2018-04-08 DIAGNOSIS — E119 Type 2 diabetes mellitus without complications: Secondary | ICD-10-CM | POA: Diagnosis not present

## 2018-04-08 DIAGNOSIS — E6609 Other obesity due to excess calories: Secondary | ICD-10-CM | POA: Diagnosis not present

## 2018-04-08 DIAGNOSIS — G4733 Obstructive sleep apnea (adult) (pediatric): Secondary | ICD-10-CM | POA: Diagnosis not present

## 2018-04-08 DIAGNOSIS — Z6835 Body mass index (BMI) 35.0-35.9, adult: Secondary | ICD-10-CM | POA: Diagnosis not present

## 2018-04-08 DIAGNOSIS — G40909 Epilepsy, unspecified, not intractable, without status epilepticus: Secondary | ICD-10-CM | POA: Diagnosis not present

## 2018-04-08 DIAGNOSIS — K219 Gastro-esophageal reflux disease without esophagitis: Secondary | ICD-10-CM | POA: Diagnosis not present

## 2018-04-08 DIAGNOSIS — L89313 Pressure ulcer of right buttock, stage 3: Secondary | ICD-10-CM | POA: Diagnosis not present

## 2018-04-08 DIAGNOSIS — I10 Essential (primary) hypertension: Secondary | ICD-10-CM | POA: Diagnosis not present

## 2018-04-11 DIAGNOSIS — K219 Gastro-esophageal reflux disease without esophagitis: Secondary | ICD-10-CM | POA: Diagnosis not present

## 2018-04-11 DIAGNOSIS — I10 Essential (primary) hypertension: Secondary | ICD-10-CM | POA: Diagnosis not present

## 2018-04-11 DIAGNOSIS — E119 Type 2 diabetes mellitus without complications: Secondary | ICD-10-CM | POA: Diagnosis not present

## 2018-04-11 DIAGNOSIS — L89313 Pressure ulcer of right buttock, stage 3: Secondary | ICD-10-CM | POA: Diagnosis not present

## 2018-04-11 DIAGNOSIS — G40909 Epilepsy, unspecified, not intractable, without status epilepticus: Secondary | ICD-10-CM | POA: Diagnosis not present

## 2018-04-11 DIAGNOSIS — G4733 Obstructive sleep apnea (adult) (pediatric): Secondary | ICD-10-CM | POA: Diagnosis not present

## 2018-04-11 DIAGNOSIS — Z6835 Body mass index (BMI) 35.0-35.9, adult: Secondary | ICD-10-CM | POA: Diagnosis not present

## 2018-04-11 DIAGNOSIS — E6609 Other obesity due to excess calories: Secondary | ICD-10-CM | POA: Diagnosis not present

## 2018-04-11 DIAGNOSIS — M1991 Primary osteoarthritis, unspecified site: Secondary | ICD-10-CM | POA: Diagnosis not present

## 2018-04-15 DIAGNOSIS — G4733 Obstructive sleep apnea (adult) (pediatric): Secondary | ICD-10-CM | POA: Diagnosis not present

## 2018-04-15 DIAGNOSIS — G40909 Epilepsy, unspecified, not intractable, without status epilepticus: Secondary | ICD-10-CM | POA: Diagnosis not present

## 2018-04-15 DIAGNOSIS — Z6835 Body mass index (BMI) 35.0-35.9, adult: Secondary | ICD-10-CM | POA: Diagnosis not present

## 2018-04-15 DIAGNOSIS — E6609 Other obesity due to excess calories: Secondary | ICD-10-CM | POA: Diagnosis not present

## 2018-04-15 DIAGNOSIS — I10 Essential (primary) hypertension: Secondary | ICD-10-CM | POA: Diagnosis not present

## 2018-04-15 DIAGNOSIS — K219 Gastro-esophageal reflux disease without esophagitis: Secondary | ICD-10-CM | POA: Diagnosis not present

## 2018-04-15 DIAGNOSIS — E119 Type 2 diabetes mellitus without complications: Secondary | ICD-10-CM | POA: Diagnosis not present

## 2018-04-15 DIAGNOSIS — M1991 Primary osteoarthritis, unspecified site: Secondary | ICD-10-CM | POA: Diagnosis not present

## 2018-04-15 DIAGNOSIS — L89313 Pressure ulcer of right buttock, stage 3: Secondary | ICD-10-CM | POA: Diagnosis not present

## 2018-04-18 DIAGNOSIS — G4733 Obstructive sleep apnea (adult) (pediatric): Secondary | ICD-10-CM | POA: Diagnosis not present

## 2018-04-18 DIAGNOSIS — I10 Essential (primary) hypertension: Secondary | ICD-10-CM | POA: Diagnosis not present

## 2018-04-18 DIAGNOSIS — G40909 Epilepsy, unspecified, not intractable, without status epilepticus: Secondary | ICD-10-CM | POA: Diagnosis not present

## 2018-04-18 DIAGNOSIS — Z6835 Body mass index (BMI) 35.0-35.9, adult: Secondary | ICD-10-CM | POA: Diagnosis not present

## 2018-04-18 DIAGNOSIS — L89313 Pressure ulcer of right buttock, stage 3: Secondary | ICD-10-CM | POA: Diagnosis not present

## 2018-04-18 DIAGNOSIS — M1991 Primary osteoarthritis, unspecified site: Secondary | ICD-10-CM | POA: Diagnosis not present

## 2018-04-18 DIAGNOSIS — E6609 Other obesity due to excess calories: Secondary | ICD-10-CM | POA: Diagnosis not present

## 2018-04-18 DIAGNOSIS — K219 Gastro-esophageal reflux disease without esophagitis: Secondary | ICD-10-CM | POA: Diagnosis not present

## 2018-04-18 DIAGNOSIS — E119 Type 2 diabetes mellitus without complications: Secondary | ICD-10-CM | POA: Diagnosis not present

## 2018-04-21 DIAGNOSIS — L89313 Pressure ulcer of right buttock, stage 3: Secondary | ICD-10-CM | POA: Diagnosis not present

## 2018-04-21 DIAGNOSIS — Z6837 Body mass index (BMI) 37.0-37.9, adult: Secondary | ICD-10-CM | POA: Diagnosis not present

## 2018-04-21 DIAGNOSIS — E119 Type 2 diabetes mellitus without complications: Secondary | ICD-10-CM | POA: Diagnosis not present

## 2018-04-21 DIAGNOSIS — I10 Essential (primary) hypertension: Secondary | ICD-10-CM | POA: Diagnosis not present

## 2018-04-22 DIAGNOSIS — K219 Gastro-esophageal reflux disease without esophagitis: Secondary | ICD-10-CM | POA: Diagnosis not present

## 2018-04-22 DIAGNOSIS — L89313 Pressure ulcer of right buttock, stage 3: Secondary | ICD-10-CM | POA: Diagnosis not present

## 2018-04-22 DIAGNOSIS — J0100 Acute maxillary sinusitis, unspecified: Secondary | ICD-10-CM | POA: Diagnosis not present

## 2018-04-22 DIAGNOSIS — M1991 Primary osteoarthritis, unspecified site: Secondary | ICD-10-CM | POA: Diagnosis not present

## 2018-04-22 DIAGNOSIS — E6609 Other obesity due to excess calories: Secondary | ICD-10-CM | POA: Diagnosis not present

## 2018-04-22 DIAGNOSIS — E119 Type 2 diabetes mellitus without complications: Secondary | ICD-10-CM | POA: Diagnosis not present

## 2018-04-22 DIAGNOSIS — G4733 Obstructive sleep apnea (adult) (pediatric): Secondary | ICD-10-CM | POA: Diagnosis not present

## 2018-04-22 DIAGNOSIS — I10 Essential (primary) hypertension: Secondary | ICD-10-CM | POA: Diagnosis not present

## 2018-04-22 DIAGNOSIS — Z6835 Body mass index (BMI) 35.0-35.9, adult: Secondary | ICD-10-CM | POA: Diagnosis not present

## 2018-04-22 DIAGNOSIS — G40909 Epilepsy, unspecified, not intractable, without status epilepticus: Secondary | ICD-10-CM | POA: Diagnosis not present

## 2018-04-25 DIAGNOSIS — Z6835 Body mass index (BMI) 35.0-35.9, adult: Secondary | ICD-10-CM | POA: Diagnosis not present

## 2018-04-25 DIAGNOSIS — I10 Essential (primary) hypertension: Secondary | ICD-10-CM | POA: Diagnosis not present

## 2018-04-25 DIAGNOSIS — L0232 Furuncle of buttock: Secondary | ICD-10-CM | POA: Diagnosis not present

## 2018-04-25 DIAGNOSIS — G4733 Obstructive sleep apnea (adult) (pediatric): Secondary | ICD-10-CM | POA: Diagnosis not present

## 2018-04-25 DIAGNOSIS — M1991 Primary osteoarthritis, unspecified site: Secondary | ICD-10-CM | POA: Diagnosis not present

## 2018-04-25 DIAGNOSIS — L89313 Pressure ulcer of right buttock, stage 3: Secondary | ICD-10-CM | POA: Diagnosis not present

## 2018-04-25 DIAGNOSIS — J329 Chronic sinusitis, unspecified: Secondary | ICD-10-CM | POA: Diagnosis not present

## 2018-04-25 DIAGNOSIS — G40909 Epilepsy, unspecified, not intractable, without status epilepticus: Secondary | ICD-10-CM | POA: Diagnosis not present

## 2018-04-25 DIAGNOSIS — E119 Type 2 diabetes mellitus without complications: Secondary | ICD-10-CM | POA: Diagnosis not present

## 2018-04-25 DIAGNOSIS — K219 Gastro-esophageal reflux disease without esophagitis: Secondary | ICD-10-CM | POA: Diagnosis not present

## 2018-04-25 DIAGNOSIS — E6609 Other obesity due to excess calories: Secondary | ICD-10-CM | POA: Diagnosis not present

## 2018-04-28 DIAGNOSIS — E119 Type 2 diabetes mellitus without complications: Secondary | ICD-10-CM | POA: Diagnosis not present

## 2018-04-28 DIAGNOSIS — L89313 Pressure ulcer of right buttock, stage 3: Secondary | ICD-10-CM | POA: Diagnosis not present

## 2018-04-28 DIAGNOSIS — Z6837 Body mass index (BMI) 37.0-37.9, adult: Secondary | ICD-10-CM | POA: Diagnosis not present

## 2018-04-28 DIAGNOSIS — I10 Essential (primary) hypertension: Secondary | ICD-10-CM | POA: Diagnosis not present

## 2018-04-29 DIAGNOSIS — L89313 Pressure ulcer of right buttock, stage 3: Secondary | ICD-10-CM | POA: Diagnosis not present

## 2018-04-29 DIAGNOSIS — M1991 Primary osteoarthritis, unspecified site: Secondary | ICD-10-CM | POA: Diagnosis not present

## 2018-04-29 DIAGNOSIS — K219 Gastro-esophageal reflux disease without esophagitis: Secondary | ICD-10-CM | POA: Diagnosis not present

## 2018-04-29 DIAGNOSIS — I10 Essential (primary) hypertension: Secondary | ICD-10-CM | POA: Diagnosis not present

## 2018-04-29 DIAGNOSIS — Z6835 Body mass index (BMI) 35.0-35.9, adult: Secondary | ICD-10-CM | POA: Diagnosis not present

## 2018-04-29 DIAGNOSIS — E119 Type 2 diabetes mellitus without complications: Secondary | ICD-10-CM | POA: Diagnosis not present

## 2018-04-29 DIAGNOSIS — E6609 Other obesity due to excess calories: Secondary | ICD-10-CM | POA: Diagnosis not present

## 2018-04-29 DIAGNOSIS — G40909 Epilepsy, unspecified, not intractable, without status epilepticus: Secondary | ICD-10-CM | POA: Diagnosis not present

## 2018-04-29 DIAGNOSIS — G4733 Obstructive sleep apnea (adult) (pediatric): Secondary | ICD-10-CM | POA: Diagnosis not present

## 2018-05-02 DIAGNOSIS — Z6835 Body mass index (BMI) 35.0-35.9, adult: Secondary | ICD-10-CM | POA: Diagnosis not present

## 2018-05-02 DIAGNOSIS — K219 Gastro-esophageal reflux disease without esophagitis: Secondary | ICD-10-CM | POA: Diagnosis not present

## 2018-05-02 DIAGNOSIS — E119 Type 2 diabetes mellitus without complications: Secondary | ICD-10-CM | POA: Diagnosis not present

## 2018-05-02 DIAGNOSIS — G4733 Obstructive sleep apnea (adult) (pediatric): Secondary | ICD-10-CM | POA: Diagnosis not present

## 2018-05-02 DIAGNOSIS — M1991 Primary osteoarthritis, unspecified site: Secondary | ICD-10-CM | POA: Diagnosis not present

## 2018-05-02 DIAGNOSIS — G40909 Epilepsy, unspecified, not intractable, without status epilepticus: Secondary | ICD-10-CM | POA: Diagnosis not present

## 2018-05-02 DIAGNOSIS — I10 Essential (primary) hypertension: Secondary | ICD-10-CM | POA: Diagnosis not present

## 2018-05-02 DIAGNOSIS — L89313 Pressure ulcer of right buttock, stage 3: Secondary | ICD-10-CM | POA: Diagnosis not present

## 2018-05-02 DIAGNOSIS — E6609 Other obesity due to excess calories: Secondary | ICD-10-CM | POA: Diagnosis not present

## 2018-05-06 DIAGNOSIS — Z6835 Body mass index (BMI) 35.0-35.9, adult: Secondary | ICD-10-CM | POA: Diagnosis not present

## 2018-05-06 DIAGNOSIS — G40909 Epilepsy, unspecified, not intractable, without status epilepticus: Secondary | ICD-10-CM | POA: Diagnosis not present

## 2018-05-06 DIAGNOSIS — G4733 Obstructive sleep apnea (adult) (pediatric): Secondary | ICD-10-CM | POA: Diagnosis not present

## 2018-05-06 DIAGNOSIS — I10 Essential (primary) hypertension: Secondary | ICD-10-CM | POA: Diagnosis not present

## 2018-05-06 DIAGNOSIS — K219 Gastro-esophageal reflux disease without esophagitis: Secondary | ICD-10-CM | POA: Diagnosis not present

## 2018-05-06 DIAGNOSIS — E6609 Other obesity due to excess calories: Secondary | ICD-10-CM | POA: Diagnosis not present

## 2018-05-06 DIAGNOSIS — L89313 Pressure ulcer of right buttock, stage 3: Secondary | ICD-10-CM | POA: Diagnosis not present

## 2018-05-06 DIAGNOSIS — M1991 Primary osteoarthritis, unspecified site: Secondary | ICD-10-CM | POA: Diagnosis not present

## 2018-05-06 DIAGNOSIS — E119 Type 2 diabetes mellitus without complications: Secondary | ICD-10-CM | POA: Diagnosis not present

## 2018-05-09 DIAGNOSIS — M1991 Primary osteoarthritis, unspecified site: Secondary | ICD-10-CM | POA: Diagnosis not present

## 2018-05-09 DIAGNOSIS — Z6835 Body mass index (BMI) 35.0-35.9, adult: Secondary | ICD-10-CM | POA: Diagnosis not present

## 2018-05-09 DIAGNOSIS — E6609 Other obesity due to excess calories: Secondary | ICD-10-CM | POA: Diagnosis not present

## 2018-05-09 DIAGNOSIS — E119 Type 2 diabetes mellitus without complications: Secondary | ICD-10-CM | POA: Diagnosis not present

## 2018-05-09 DIAGNOSIS — I10 Essential (primary) hypertension: Secondary | ICD-10-CM | POA: Diagnosis not present

## 2018-05-09 DIAGNOSIS — L89313 Pressure ulcer of right buttock, stage 3: Secondary | ICD-10-CM | POA: Diagnosis not present

## 2018-05-09 DIAGNOSIS — G4733 Obstructive sleep apnea (adult) (pediatric): Secondary | ICD-10-CM | POA: Diagnosis not present

## 2018-05-09 DIAGNOSIS — K219 Gastro-esophageal reflux disease without esophagitis: Secondary | ICD-10-CM | POA: Diagnosis not present

## 2018-05-09 DIAGNOSIS — G40909 Epilepsy, unspecified, not intractable, without status epilepticus: Secondary | ICD-10-CM | POA: Diagnosis not present

## 2018-05-13 DIAGNOSIS — G4733 Obstructive sleep apnea (adult) (pediatric): Secondary | ICD-10-CM | POA: Diagnosis not present

## 2018-05-13 DIAGNOSIS — I10 Essential (primary) hypertension: Secondary | ICD-10-CM | POA: Diagnosis not present

## 2018-05-13 DIAGNOSIS — G40909 Epilepsy, unspecified, not intractable, without status epilepticus: Secondary | ICD-10-CM | POA: Diagnosis not present

## 2018-05-13 DIAGNOSIS — M1991 Primary osteoarthritis, unspecified site: Secondary | ICD-10-CM | POA: Diagnosis not present

## 2018-05-13 DIAGNOSIS — E6609 Other obesity due to excess calories: Secondary | ICD-10-CM | POA: Diagnosis not present

## 2018-05-13 DIAGNOSIS — Z6835 Body mass index (BMI) 35.0-35.9, adult: Secondary | ICD-10-CM | POA: Diagnosis not present

## 2018-05-13 DIAGNOSIS — K219 Gastro-esophageal reflux disease without esophagitis: Secondary | ICD-10-CM | POA: Diagnosis not present

## 2018-05-13 DIAGNOSIS — L89313 Pressure ulcer of right buttock, stage 3: Secondary | ICD-10-CM | POA: Diagnosis not present

## 2018-05-13 DIAGNOSIS — E119 Type 2 diabetes mellitus without complications: Secondary | ICD-10-CM | POA: Diagnosis not present

## 2018-05-16 DIAGNOSIS — L89313 Pressure ulcer of right buttock, stage 3: Secondary | ICD-10-CM | POA: Diagnosis not present

## 2018-05-16 DIAGNOSIS — Z6835 Body mass index (BMI) 35.0-35.9, adult: Secondary | ICD-10-CM | POA: Diagnosis not present

## 2018-05-16 DIAGNOSIS — I10 Essential (primary) hypertension: Secondary | ICD-10-CM | POA: Diagnosis not present

## 2018-05-16 DIAGNOSIS — G40909 Epilepsy, unspecified, not intractable, without status epilepticus: Secondary | ICD-10-CM | POA: Diagnosis not present

## 2018-05-16 DIAGNOSIS — K219 Gastro-esophageal reflux disease without esophagitis: Secondary | ICD-10-CM | POA: Diagnosis not present

## 2018-05-16 DIAGNOSIS — G4733 Obstructive sleep apnea (adult) (pediatric): Secondary | ICD-10-CM | POA: Diagnosis not present

## 2018-05-16 DIAGNOSIS — M1991 Primary osteoarthritis, unspecified site: Secondary | ICD-10-CM | POA: Diagnosis not present

## 2018-05-16 DIAGNOSIS — E119 Type 2 diabetes mellitus without complications: Secondary | ICD-10-CM | POA: Diagnosis not present

## 2018-05-16 DIAGNOSIS — E6609 Other obesity due to excess calories: Secondary | ICD-10-CM | POA: Diagnosis not present

## 2018-05-19 DIAGNOSIS — L89319 Pressure ulcer of right buttock, unspecified stage: Secondary | ICD-10-CM | POA: Diagnosis not present

## 2018-05-19 DIAGNOSIS — Z09 Encounter for follow-up examination after completed treatment for conditions other than malignant neoplasm: Secondary | ICD-10-CM | POA: Diagnosis not present

## 2018-05-19 DIAGNOSIS — Z872 Personal history of diseases of the skin and subcutaneous tissue: Secondary | ICD-10-CM | POA: Diagnosis not present

## 2018-05-20 DIAGNOSIS — M1991 Primary osteoarthritis, unspecified site: Secondary | ICD-10-CM | POA: Diagnosis not present

## 2018-05-20 DIAGNOSIS — Z6835 Body mass index (BMI) 35.0-35.9, adult: Secondary | ICD-10-CM | POA: Diagnosis not present

## 2018-05-20 DIAGNOSIS — G4733 Obstructive sleep apnea (adult) (pediatric): Secondary | ICD-10-CM | POA: Diagnosis not present

## 2018-05-20 DIAGNOSIS — E6609 Other obesity due to excess calories: Secondary | ICD-10-CM | POA: Diagnosis not present

## 2018-05-20 DIAGNOSIS — K219 Gastro-esophageal reflux disease without esophagitis: Secondary | ICD-10-CM | POA: Diagnosis not present

## 2018-05-20 DIAGNOSIS — I10 Essential (primary) hypertension: Secondary | ICD-10-CM | POA: Diagnosis not present

## 2018-05-20 DIAGNOSIS — L89313 Pressure ulcer of right buttock, stage 3: Secondary | ICD-10-CM | POA: Diagnosis not present

## 2018-05-20 DIAGNOSIS — E119 Type 2 diabetes mellitus without complications: Secondary | ICD-10-CM | POA: Diagnosis not present

## 2018-05-20 DIAGNOSIS — G40909 Epilepsy, unspecified, not intractable, without status epilepticus: Secondary | ICD-10-CM | POA: Diagnosis not present

## 2018-05-25 ENCOUNTER — Ambulatory Visit (INDEPENDENT_AMBULATORY_CARE_PROVIDER_SITE_OTHER): Payer: Medicare HMO | Admitting: Allergy and Immunology

## 2018-05-25 ENCOUNTER — Encounter: Payer: Self-pay | Admitting: Allergy and Immunology

## 2018-05-25 VITALS — BP 124/80 | HR 60 | Resp 18 | Ht 65.0 in | Wt 240.6 lb

## 2018-05-25 DIAGNOSIS — J329 Chronic sinusitis, unspecified: Secondary | ICD-10-CM | POA: Diagnosis not present

## 2018-05-25 DIAGNOSIS — B999 Unspecified infectious disease: Secondary | ICD-10-CM

## 2018-05-25 NOTE — Patient Instructions (Addendum)
  1. Continue to Treat and prevent inflammation:   A. Flonase one spray each nostril twice a day  B. montelukast 10 mg tablet 1 time per day  2. Continue to Treat and prevent reflux:   A. Omeprazole 40mg  one time per day  3. If needed:   A. nasal saline  B. cetirizine 10 mg tablet 1 time per day  4. Obtain sinus CT scan for chronic sisnusitis  5. Obtain blood - CBC w/diff, IgA/G/M, Anti-pneumo ab, anti-tetanus ab  6. Further treatment?

## 2018-05-25 NOTE — Progress Notes (Signed)
Follow-up Note  Referring Provider: Townsend Roger, MD Primary Provider: Nona Dell, Corene Cornea, MD Date of Office Visit: 05/25/2018  Subjective:   Alicia Kelly (DOB: 10/22/1955) is a 63 y.o. female who returns to the [facility] on 05/25/2018 in re-evaluation of the following:  HPI: Selia returns to this clinic in reevaluation of her reflux induced respiratory disease and history of chronic upper airway symptoms.  I last saw her in this clinic on 13 February 2018.  She tells me that she has received four antibiotics since her last visit for "sinus problems".  Sinus problems include blowing out ugly nasal discharge and ugly postnasal drip.  As well, she informs me that she has pretty significant congestion of her upper airway even in the face of utilizing her nasal steroid and montelukast on a regular basis.  Her reflux appears to be under very good control using omeprazole 40 mg daily at this point.  Allergies as of 05/25/2018      Reactions   Levofloxacin Other (See Comments)   Throat swelling   Penicillins Anaphylaxis   Clarithromycin Other (See Comments)   seizures   Sulfamethoxazole-trimethoprim    Causes seizures      Medication List      amLODipine 10 MG tablet Commonly known as:  NORVASC   cetirizine 10 MG tablet Commonly known as:  ZYRTEC Take 10 mg by mouth daily as needed.   clotrimazole-betamethasone cream Commonly known as:  LOTRISONE   doxycycline 100 MG capsule Commonly known as:  MONODOX   fluticasone 50 MCG/ACT nasal spray Commonly known as:  FLONASE   irbesartan-hydrochlorothiazide 150-12.5 MG tablet Commonly known as:  AVALIDE   levETIRAcetam 500 MG tablet Commonly known as:  KEPPRA   montelukast 10 MG tablet Commonly known as:  SINGULAIR TAKE ONE TABLET BY MOUTH DAILY AS DIRECTED   nystatin cream Commonly known as:  MYCOSTATIN   omeprazole 40 MG capsule Commonly known as:  PRILOSEC Take 40 mg by mouth 2 (two) times  daily.   potassium chloride 10 MEQ tablet Commonly known as:  K-DUR,KLOR-CON   ranitidine 300 MG tablet Commonly known as:  ZANTAC TAKE ONE TABLET BY MOUTH AT BEDTIME   simvastatin 20 MG tablet Commonly known as:  ZOCOR   VIMPAT 200 MG Tabs tablet Generic drug:  lacosamide       Past Medical History:  Diagnosis Date  . Asthma   . Diabetes ([place])   . Epilepsy ([place])   . Hypertension   . Sleep apnea     Past Surgical History:  Procedure Laterality Date  . APPENDECTOMY    . TONSILLECTOMY      Review of systems negative except as noted in HPI / PMHx or noted below:  Review of Systems  Constitutional: Negative.   HENT: Negative.   Eyes: Negative.   Respiratory: Negative.   Cardiovascular: Negative.   Gastrointestinal: Negative.   Genitourinary: Negative.   Musculoskeletal: Negative.   Skin: Negative.   Neurological: Negative.   Endo/Heme/Allergies: Negative.   Psychiatric/Behavioral: Negative.      Objective:   Vitals:   05/25/18 1118  BP: 124/80  Pulse: 60  Resp: 18   Height: 5\' 5"  (165.1 cm)  Weight: 240 lb 9.6 oz (109.1 kg)   Physical Exam Constitutional:      Appearance: She is not diaphoretic.  HENT:     Head: Normocephalic.     Right Ear: Tympanic membrane, ear canal and external ear normal.  Left Ear: Tympanic membrane, ear canal and external ear normal.     Nose: Nose normal. No mucosal edema or rhinorrhea.     Mouth/Throat:     Pharynx: Uvula midline. No oropharyngeal exudate.  Eyes:     Conjunctiva/sclera: Conjunctivae normal.  Neck:     Thyroid: No thyromegaly.     Trachea: Trachea normal. No tracheal tenderness or tracheal deviation.  Cardiovascular:     Rate and Rhythm: Normal rate and regular rhythm.     Heart sounds: Normal heart sounds, S1 normal and S2 normal. No murmur.  Pulmonary:     Effort: No respiratory distress.     Breath sounds: Normal breath sounds. No stridor. No wheezing or rales.  Lymphadenopathy:     Head:      Right side of head: No tonsillar adenopathy.     Left side of head: No tonsillar adenopathy.     Cervical: No cervical adenopathy.  Skin:    Findings: No erythema or rash.     Nails: There is no clubbing.   Neurological:     Mental Status: She is alert.     Diagnostics: none  Assessment and Plan:   1. Chronic sinusitis, unspecified location   2. Recurrent infections     1. Continue to Treat and prevent inflammation:   A. Flonase one spray each nostril twice a day  B. montelukast 10 mg tablet 1 time per day  2. Continue to Treat and prevent reflux:   A. Omeprazole 40mg  one time per day  3. If needed:   A. nasal saline  B. cetirizine 10 mg tablet 1 time per day  4. Obtain sinus CT scan for chronic sisnusitis  5. Obtain blood - CBC w/diff, IgA/G/M, Anti-pneumo ab, anti-tetanus ab  6. Further treatment?  Obviously something is not correct with [place] given the fact that she requires antibiotics on almost a monthly basis to treat "sinus".  I think we need to image her upper airway and as well make sure that she can mount a antigen specific antibody response against pathogens.  I will contact her with the results of her test once they are available for review.  Further evaluation treatment will be based upon these results.  Allena Katz, MD Allergy / Immunology [place]

## 2018-05-26 ENCOUNTER — Encounter: Payer: Self-pay | Admitting: Allergy and Immunology

## 2018-05-27 DIAGNOSIS — J329 Chronic sinusitis, unspecified: Secondary | ICD-10-CM | POA: Diagnosis not present

## 2018-05-27 DIAGNOSIS — B999 Unspecified infectious disease: Secondary | ICD-10-CM | POA: Diagnosis not present

## 2018-05-31 ENCOUNTER — Telehealth: Payer: Self-pay | Admitting: *Deleted

## 2018-05-31 NOTE — Telephone Encounter (Signed)
Sinus CT scan scheduled at [facility] Thursday 06/02/2018 arriving at 1:00 for a 1:30 appt. Patient informed and order faxed.

## 2018-06-02 DIAGNOSIS — J329 Chronic sinusitis, unspecified: Secondary | ICD-10-CM | POA: Diagnosis not present

## 2018-06-02 DIAGNOSIS — J320 Chronic maxillary sinusitis: Secondary | ICD-10-CM | POA: Diagnosis not present

## 2018-06-02 LAB — CBC WITH DIFFERENTIAL/PLATELET
BASOS: 1 %
Basophils Absolute: 0.1 10*3/uL (ref 0.0–0.2)
EOS (ABSOLUTE): 0.5 10*3/uL — AB (ref 0.0–0.4)
EOS: 4 %
HEMATOCRIT: 36 % (ref 34.0–46.6)
HEMOGLOBIN: 11.9 g/dL (ref 11.1–15.9)
IMMATURE GRANS (ABS): 0 10*3/uL (ref 0.0–0.1)
IMMATURE GRANULOCYTES: 0 %
LYMPHS: 15 %
Lymphocytes Absolute: 1.6 10*3/uL (ref 0.7–3.1)
MCH: 28.5 pg (ref 26.6–33.0)
MCHC: 33.1 g/dL (ref 31.5–35.7)
MCV: 86 fL (ref 79–97)
MONOCYTES: 7 %
Monocytes Absolute: 0.8 10*3/uL (ref 0.1–0.9)
NEUTROS ABS: 8.2 10*3/uL — AB (ref 1.4–7.0)
NEUTROS PCT: 73 %
PLATELETS: 484 10*3/uL — AB (ref 150–450)
RBC: 4.18 x10E6/uL (ref 3.77–5.28)
RDW: 12.8 % (ref 11.7–15.4)
WBC: 11.2 10*3/uL — ABNORMAL HIGH (ref 3.4–10.8)

## 2018-06-02 LAB — STREP PNEUMONIAE 23 SEROTYPES IGG
PNEUMO AB TYPE 12 (12F): 1.1 ug/mL — AB (ref >1.3)
PNEUMO AB TYPE 17 (17F): 0.7 ug/mL — AB (ref >1.3)
PNEUMO AB TYPE 19 (19F): 3 ug/mL (ref >1.3)
PNEUMO AB TYPE 1: 0.5 ug/mL — AB (ref >1.3)
PNEUMO AB TYPE 22 (22F): 11 ug/mL (ref >1.3)
PNEUMO AB TYPE 23 (23F): 0.4 ug/mL — AB (ref >1.3)
PNEUMO AB TYPE 2: 0.7 ug/mL — AB (ref >1.3)
PNEUMO AB TYPE 3: 7.9 ug/mL (ref >1.3)
PNEUMO AB TYPE 43 (11A): 0.4 ug/mL — AB (ref >1.3)
PNEUMO AB TYPE 56 (18C): 0.3 ug/mL — AB (ref >1.3)
PNEUMO AB TYPE 68 (9V): 6.9 ug/mL (ref >1.3)
PNEUMO AB TYPE 70 (33F): 0.6 ug/mL — AB (ref >1.3)
PNEUMO AB TYPE 9 (9N): 2 ug/mL (ref >1.3)
Pneumo Ab Type 14*: 0.2 ug/mL — ABNORMAL LOW (ref >1.3)
Pneumo Ab Type 20*: 0.8 ug/mL — ABNORMAL LOW (ref >1.3)
Pneumo Ab Type 26 (6B)*: 1.2 ug/mL — ABNORMAL LOW (ref >1.3)
Pneumo Ab Type 34 (10A)*: 0.3 ug/mL — ABNORMAL LOW (ref >1.3)
Pneumo Ab Type 4*: 0.7 ug/mL — ABNORMAL LOW (ref >1.3)
Pneumo Ab Type 5*: 0.4 ug/mL — ABNORMAL LOW (ref >1.3)
Pneumo Ab Type 51 (7F)*: 0.4 ug/mL — ABNORMAL LOW (ref >1.3)
Pneumo Ab Type 54 (15B)*: 0.5 ug/mL — ABNORMAL LOW (ref >1.3)
Pneumo Ab Type 57 (19A)*: 6.2 ug/mL (ref >1.3)
Pneumo Ab Type 8*: 0.7 ug/mL — ABNORMAL LOW (ref >1.3)

## 2018-06-02 LAB — IGG, IGA, IGM
IgA/Immunoglobulin A, Serum: 151 mg/dL (ref 87–352)
IgG (Immunoglobin G), Serum: 927 mg/dL (ref 700–1600)
IgM (Immunoglobulin M), Srm: 115 mg/dL (ref 26–217)

## 2018-06-02 LAB — TETANUS ANTIBODY, IGG: Tetanus Ab, IgG: 1.49 IU/mL (ref <0.10)

## 2018-06-06 ENCOUNTER — Other Ambulatory Visit: Payer: Self-pay | Admitting: *Deleted

## 2018-06-06 MED ORDER — CEFUROXIME AXETIL 250 MG PO TABS: ORAL_TABLET | ORAL | 0 refills | Status: DC

## 2018-06-06 MED ORDER — PREDNISONE 10 MG PO TABS: ORAL_TABLET | ORAL | 0 refills | Status: DC

## 2018-06-08 DIAGNOSIS — N390 Urinary tract infection, site not specified: Secondary | ICD-10-CM | POA: Diagnosis not present

## 2018-06-08 DIAGNOSIS — E1165 Type 2 diabetes mellitus with hyperglycemia: Secondary | ICD-10-CM | POA: Diagnosis not present

## 2018-06-08 DIAGNOSIS — I10 Essential (primary) hypertension: Secondary | ICD-10-CM | POA: Diagnosis not present

## 2018-06-21 ENCOUNTER — Telehealth: Payer: Self-pay | Admitting: Allergy and Immunology

## 2018-06-21 NOTE — Telephone Encounter (Signed)
Yes, please come to clinic after finishing antibiotics

## 2018-06-21 NOTE — Telephone Encounter (Signed)
Patient is finishing antibiotic X 20 days.  When do want patient to follow back up in clinic

## 2018-06-21 NOTE — Telephone Encounter (Signed)
Patient is finishing the antibiotic Wants to know what to do - should she be seen again or take more meds??

## 2018-06-22 DIAGNOSIS — G4733 Obstructive sleep apnea (adult) (pediatric): Secondary | ICD-10-CM | POA: Diagnosis not present

## 2018-06-22 DIAGNOSIS — R5383 Other fatigue: Secondary | ICD-10-CM | POA: Diagnosis not present

## 2018-06-22 DIAGNOSIS — J4520 Mild intermittent asthma, uncomplicated: Secondary | ICD-10-CM | POA: Diagnosis not present

## 2018-06-22 NOTE — Telephone Encounter (Signed)
Called patient and advised. appt made

## 2018-06-29 ENCOUNTER — Ambulatory Visit: Payer: Medicaid Other | Admitting: Allergy and Immunology

## 2018-07-04 DIAGNOSIS — G4733 Obstructive sleep apnea (adult) (pediatric): Secondary | ICD-10-CM | POA: Diagnosis not present

## 2018-07-05 DIAGNOSIS — R39198 Other difficulties with micturition: Secondary | ICD-10-CM | POA: Diagnosis not present

## 2018-07-06 DIAGNOSIS — E119 Type 2 diabetes mellitus without complications: Secondary | ICD-10-CM | POA: Diagnosis not present

## 2018-07-06 DIAGNOSIS — E785 Hyperlipidemia, unspecified: Secondary | ICD-10-CM | POA: Diagnosis not present

## 2018-07-06 DIAGNOSIS — I10 Essential (primary) hypertension: Secondary | ICD-10-CM | POA: Diagnosis not present

## 2018-07-06 DIAGNOSIS — I2510 Atherosclerotic heart disease of native coronary artery without angina pectoris: Secondary | ICD-10-CM | POA: Diagnosis not present

## 2018-07-06 DIAGNOSIS — G40909 Epilepsy, unspecified, not intractable, without status epilepticus: Secondary | ICD-10-CM | POA: Diagnosis not present

## 2018-07-06 DIAGNOSIS — R7989 Other specified abnormal findings of blood chemistry: Secondary | ICD-10-CM | POA: Diagnosis not present

## 2018-07-06 DIAGNOSIS — J45909 Unspecified asthma, uncomplicated: Secondary | ICD-10-CM | POA: Diagnosis not present

## 2018-07-06 DIAGNOSIS — R072 Precordial pain: Secondary | ICD-10-CM | POA: Diagnosis not present

## 2018-07-06 DIAGNOSIS — E669 Obesity, unspecified: Secondary | ICD-10-CM | POA: Diagnosis not present

## 2018-07-06 DIAGNOSIS — R079 Chest pain, unspecified: Secondary | ICD-10-CM | POA: Diagnosis not present

## 2018-07-06 DIAGNOSIS — R0602 Shortness of breath: Secondary | ICD-10-CM | POA: Diagnosis not present

## 2018-07-07 ENCOUNTER — Ambulatory Visit: Payer: Medicaid Other | Admitting: Allergy and Immunology

## 2018-07-07 DIAGNOSIS — E669 Obesity, unspecified: Secondary | ICD-10-CM

## 2018-07-07 DIAGNOSIS — I10 Essential (primary) hypertension: Secondary | ICD-10-CM | POA: Diagnosis not present

## 2018-07-07 DIAGNOSIS — I2510 Atherosclerotic heart disease of native coronary artery without angina pectoris: Secondary | ICD-10-CM | POA: Diagnosis not present

## 2018-07-07 DIAGNOSIS — R079 Chest pain, unspecified: Secondary | ICD-10-CM | POA: Diagnosis not present

## 2018-07-07 DIAGNOSIS — E1169 Type 2 diabetes mellitus with other specified complication: Secondary | ICD-10-CM | POA: Diagnosis not present

## 2018-07-13 ENCOUNTER — Ambulatory Visit (INDEPENDENT_AMBULATORY_CARE_PROVIDER_SITE_OTHER): Payer: Medicare HMO | Admitting: Allergy and Immunology

## 2018-07-13 ENCOUNTER — Encounter: Payer: Self-pay | Admitting: Allergy and Immunology

## 2018-07-13 VITALS — BP 124/70 | HR 72 | Resp 20

## 2018-07-13 DIAGNOSIS — K219 Gastro-esophageal reflux disease without esophagitis: Secondary | ICD-10-CM | POA: Diagnosis not present

## 2018-07-13 DIAGNOSIS — J3089 Other allergic rhinitis: Secondary | ICD-10-CM

## 2018-07-13 DIAGNOSIS — J324 Chronic pansinusitis: Secondary | ICD-10-CM | POA: Diagnosis not present

## 2018-07-13 MED ORDER — MUPIROCIN 2 % EX OINT: TOPICAL_OINTMENT | CUTANEOUS | 0 refills | Status: DC

## 2018-07-13 MED ORDER — CLINDAMYCIN HCL 150 MG PO CAPS: ORAL_CAPSULE | ORAL | 0 refills | Status: DC

## 2018-07-13 MED ORDER — PANTOPRAZOLE SODIUM 40 MG PO TBEC: 40.0000 mg | DELAYED_RELEASE_TABLET | Freq: Two times a day (BID) | ORAL | 5 refills | Status: DC

## 2018-07-13 MED ORDER — FLUTICASONE PROPIONATE 50 MCG/ACT NA SUSP: NASAL | 1 refills | Status: DC

## 2018-07-13 NOTE — Progress Notes (Signed)
Follow-up Note  Referring Provider: Townsend Roger, MD Primary Provider: Nona Dell, Corene Cornea, MD Date of Office Visit: 07/13/2018  Subjective:   Alicia Kelly (DOB: 07/24/1955) is a 63 y.o. female who returns to [facility] on 07/13/2018 in re-evaluation of the following:  HPI: [name] returns to this clinic in evaluation of chronic sinusitis and a history of reflux induced respiratory disease.  She was last seen in this clinic on 25 May 2018 with a history of recurrent infections throughout the winter.  During her last visit we obtained a sinus CT scan which identified chronic sinusitis and she was placed on 20 days of cephalosporin as well as 10 days of prednisone.  She actually feels a little bit better regarding her upper airway issue but she still has ugly nasal discharge and ugly postnasal drip and a slight cough to clear out her throat.  She was admitted to [facility] about 8 days ago with issues of shortness of breath and epigastric pain for which she apparently had a normal cardiac stress test and was discharged with instructions to see cardiology, Dr. Bettina Gavia, for further follow-up.  She has seen Dr. Bettina Gavia in the past for an issue with bradycardia.  Most of her shortness of breath and her epigastric discomfort has resolved.  She does not have a history of wheezing or significant coughing other than to clear out her throat and does not really complain about recurrent chest pain, chest tightness, or shortness of breath.  She did have a prolonged cough in the past but that appeared to be secondary to reflux induced respiratory disease which is under very good control with attention directed at her reflux disease.  She also apparently had problems with her seizure activity around the time of her hospitalization.  She tells me that she was "shivering and shaking" for a few days prior to that hospitalization although she did not think that she had a fever.  She is  being evaluated for sleep apnea.  She has had sleep apnea in the past and used a CPAP machine but she has lost 120 pounds of weight and feels as though she does not need to use this device at this point.  Currently she had a recent home study and she is scheduled to follow-up with someone regarding further evaluation of that issue.  Allergies as of 07/13/2018      Reactions   Levofloxacin Other (See Comments)   Throat swelling   Penicillins Anaphylaxis   Clarithromycin Other (See Comments)   seizures   Sulfamethoxazole-trimethoprim    Causes seizures      Medication List      amLODipine 10 MG tablet Commonly known as:  NORVASC   cetirizine 10 MG tablet Commonly known as:  ZYRTEC Take 10 mg by mouth daily as needed.   clotrimazole-betamethasone cream Commonly known as:  LOTRISONE   fluticasone 50 MCG/ACT nasal spray Commonly known as:  FLONASE   irbesartan-hydrochlorothiazide 150-12.5 MG tablet Commonly known as:  AVALIDE   levETIRAcetam 500 MG tablet Commonly known as:  KEPPRA   montelukast 10 MG tablet Commonly known as:  SINGULAIR TAKE ONE TABLET BY MOUTH DAILY AS DIRECTED   nystatin cream Commonly known as:  MYCOSTATIN   omeprazole 40 MG capsule Commonly known as:  PRILOSEC Take 40 mg by mouth 2 (two) times daily.   potassium chloride 10 MEQ tablet Commonly known as:  K-DUR,KLOR-CON   simvastatin 20 MG tablet Commonly known as:  ZOCOR   VIMPAT 200 MG Tabs tablet Generic drug:  lacosamide       Past Medical History:  Diagnosis Date  . Asthma   . Diabetes ([place])   . Epilepsy ([place])   . Hypertension   . Sleep apnea     Past Surgical History:  Procedure Laterality Date  . APPENDECTOMY    . TONSILLECTOMY      Review of systems negative except as noted in HPI / PMHx or noted below:  Review of Systems  Constitutional: Negative.   HENT: Negative.   Eyes: Negative.   Respiratory: Negative.   Cardiovascular: Negative.   Gastrointestinal:  Negative.   Genitourinary: Negative.   Musculoskeletal: Negative.   Skin: Negative.   Neurological: Negative.   Endo/Heme/Allergies: Negative.   Psychiatric/Behavioral: Negative.      Objective:   Vitals:   07/13/18 0906  BP: 124/70  Pulse: 72  Resp: 20          Physical Exam Constitutional:      Appearance: She is not diaphoretic.  HENT:     Head: Normocephalic.     Right Ear: Tympanic membrane, ear canal and external ear normal.     Left Ear: Tympanic membrane, ear canal and external ear normal.     Nose: Rhinorrhea (Mucoid-purulent coating nasal mucosa and turbinates) present. No mucosal edema.     Mouth/Throat:     Pharynx: Uvula midline. No oropharyngeal exudate.  Eyes:     Conjunctiva/sclera: Conjunctivae normal.  Neck:     Thyroid: No thyromegaly.     Trachea: Trachea normal. No tracheal tenderness or tracheal deviation.  Cardiovascular:     Rate and Rhythm: Normal rate and regular rhythm.     Heart sounds: Normal heart sounds, S1 normal and S2 normal. No murmur.  Pulmonary:     Effort: No respiratory distress.     Breath sounds: Normal breath sounds. No stridor. No wheezing or rales.  Lymphadenopathy:     Head:     Right side of head: No tonsillar adenopathy.     Left side of head: No tonsillar adenopathy.     Cervical: No cervical adenopathy.  Skin:    Findings: No erythema or rash.     Nails: There is no clubbing.   Neurological:     Mental Status: She is alert.      Diagnostics:    Spirometry was performed and demonstrated an FEV1 of 2.34 at 85 % of predicted.  Results of blood tests obtained 27 May 2018 identified IgG 927 mg/DL, IgM 115 mg/DL, IgA 151 mg/DL, WBC 11.2, absolute eosinophil 500, absolute lymphocyte 1600, hemoglobin 11.9, platelet 484.  She had low levels of antibodies directed against multiple serotypes of pneumococcus, tetanus IgG anti-toxoid antibody 1.49 IU/mL,  Results of a sinus CT scan obtained 02 June 2018  identified extensive mucosal edema in the maxillary sinuses bilaterally with evidence of a medial antrostomy bilaterally, partial ethmoidectomy with mild mucosal edema bilaterally.  She was started on Ceftin 250 mg twice a day aiming for a total of 20 days and prednisone 10 mg a day aiming for a total of 10 days on 06 June 2018  Results of a chest x-ray obtained 06 July 2018 identified no pleural effusion or focal consolidation and normal cardiomediastinal silhouette.  Results of a chest CT scan angiography obtained 06 July 2018 identified no clot, normal mediastinum, normal lungs with a small calcified granuloma in the right lower lobe and a 2 mm lung nodule  in the left lower lobe.  Assessment and Plan:   1. Chronic pansinusitis   2. Other allergic rhinitis   3. LPRD (laryngopharyngeal reflux disease)      1. Continue to Treat and prevent inflammation:   A. Flonase one spray each nostril twice a day  B. montelukast 10 mg tablet 1 time per day  2. Continue to Treat and prevent reflux:   A. Increase Pantoprazole 40mg  TWO times per day  3. Treat infection with the following:   A.  Nasal saline wash three times per day  B.  Bactroban in both nostrils 3 times per day for 30 days  C.  Clindamycin 150-1 tablet 3 times per day for 30 days  3. If needed:   A. cetirizine 10 mg tablet 1 time per day  4.  Return to clinic in 30 days or earlier if problem   I will have Britlee use broad-spectrum antibiotics and topical antimicrobial agents for what appears to be chronic sinusitis and have her continue to use anti-inflammatory agents for the inflammatory component of this condition and considering the fact that we could not find her to be atopic in the past will consider that she has a respiratory insult secondary to her reflux disease and have her use her proton pump inhibitor twice a day.  I will see her back in this clinic in 30 days or earlier if there is a problem.  Allena Katz, MD Allergy / Immunology [place]

## 2018-07-13 NOTE — Patient Instructions (Addendum)
  1. Continue to Treat and prevent inflammation:   A. Flonase one spray each nostril twice a day  B. montelukast 10 mg tablet 1 time per day  2. Continue to Treat and prevent reflux:   A. Increase Pantoprazole 40mg  TWO times per day  3. Treat infection with the following:   A.  Nasal saline wash three times per day  B.  Bactroban in both nostrils 3 times per day for 30 days  C.  Clindamycin 150-1 tablet 3 times per day for 30 days  3. If needed:   A. cetirizine 10 mg tablet 1 time per day  4.  Return to clinic in 30 days or earlier if problem

## 2018-07-14 ENCOUNTER — Encounter: Payer: Self-pay | Admitting: Allergy and Immunology

## 2018-07-15 ENCOUNTER — Encounter: Payer: Self-pay | Admitting: *Deleted

## 2018-07-20 DIAGNOSIS — G4733 Obstructive sleep apnea (adult) (pediatric): Secondary | ICD-10-CM | POA: Diagnosis not present

## 2018-07-20 DIAGNOSIS — R5383 Other fatigue: Secondary | ICD-10-CM | POA: Diagnosis not present

## 2018-07-20 DIAGNOSIS — J4520 Mild intermittent asthma, uncomplicated: Secondary | ICD-10-CM | POA: Diagnosis not present

## 2018-07-25 DIAGNOSIS — G4730 Sleep apnea, unspecified: Secondary | ICD-10-CM | POA: Diagnosis not present

## 2018-07-25 DIAGNOSIS — I10 Essential (primary) hypertension: Secondary | ICD-10-CM | POA: Diagnosis not present

## 2018-07-25 DIAGNOSIS — M1990 Unspecified osteoarthritis, unspecified site: Secondary | ICD-10-CM | POA: Diagnosis not present

## 2018-07-25 DIAGNOSIS — E1152 Type 2 diabetes mellitus with diabetic peripheral angiopathy with gangrene: Secondary | ICD-10-CM | POA: Diagnosis not present

## 2018-07-25 DIAGNOSIS — I96 Gangrene, not elsewhere classified: Secondary | ICD-10-CM | POA: Diagnosis not present

## 2018-07-25 DIAGNOSIS — G40909 Epilepsy, unspecified, not intractable, without status epilepticus: Secondary | ICD-10-CM | POA: Diagnosis not present

## 2018-07-25 DIAGNOSIS — L89312 Pressure ulcer of right buttock, stage 2: Secondary | ICD-10-CM | POA: Diagnosis not present

## 2018-07-25 DIAGNOSIS — E7800 Pure hypercholesterolemia, unspecified: Secondary | ICD-10-CM | POA: Diagnosis not present

## 2018-07-25 DIAGNOSIS — K219 Gastro-esophageal reflux disease without esophagitis: Secondary | ICD-10-CM | POA: Diagnosis not present

## 2018-07-30 NOTE — Progress Notes (Signed)
Cardiology Office Note:    Date:  08/01/2018   ID:  Alicia Kelly, DOB 07-Oct-1955, MRN [id number]  PCP:  Townsend Roger, MD  Cardiologist:  Shirlee More, MD    Referring MD: Townsend Roger, MD    ASSESSMENT:    1. Chest pain in adult   2. Essential hypertension   3. Mixed hyperlipidemia   4. Type 2 diabetes mellitus without complication, without long-term current use of insulin ([place])   5. Coronary artery calcification seen on CT scan    PLAN:    In order of problems listed above:  1. Chest pain presentation was quite atypical for ischemia very typical for recurrent or chronic costochondritis I offered her reassurance Tylenol as needed if symptoms worsen can referral to physical therapy modalities.  Understanding we will report both quite hesitant to send her to the hospital on today's coronavirus environment. 2. Stable hypertension continue current treatment including both ACE calcium channel blocker diuretic 3. Stable hyperlipidemia continue high intensity statin with coronary artery calcification multiple risk factors total cholesterol 164 HDL 63 LDL 60 24 August 2017 4. Stable managed by her PCP per last A1c 06/08/2018 7.5 and additional treatment meters agents like Victoza or SGLT2 inhibitors would be appropriate 5. She had scant coronary artery calcification on CTA would equal a low to intermediate score on cardiac CT scoring and at this time and continue her current treatment for diabetes hypertension hyperlipidemia and the absence of typical angina would not perform further evaluation if her symptoms progress become more typical cardiac CTA would be appropriate but I would not repeat nuclear medicine evaluation   Next appointment: As needed   Medication Adjustments/Labs and Tests Ordered: Current medicines are reviewed at length with the patient today.  Concerns regarding medicines are outlined above.  No orders of the defined types were placed in this encounter.  No orders  of the defined types were placed in this encounter.   Chief Complaint  Patient presents with  . Follow-up    after [facility] admission for chest pain    History of Present Illness:    Alicia Kelly is a 63 y.o. female with a hx of type 2 diabetes mellitus hypertension dyslipidemia and previous chest pain evaluation last seen 03/24/16 at [facility]. Compliance with diet, lifestyle and medications: Yes  She was seen at [facility] emergency room 07/06/2018 with chest pain radiated to the left shoulder left neck was felt to be anginal in nature and there is a notation she was improved with nitroglycerin her EKG did not show ischemic changes her troponin was normal d-dimer mildly elevated at 620 and she had a cardiac CTA that showed no evidence of pulmonary embolism and scant coronary artery calcification.  She was admitted to the hospital for further evaluation and she underwent a myocardial perfusion study.  Her exercise tolerance was diminished at 4 minutes blood pressure response was felt to be normal she had no ischemic ST segment abnormality in a myocardial perfusion study allowing for breast attenuation was normal no ischemia and normal left ventricular function.  She is seen by me in the office today in follow-up to that hospitalization.  Other studies performed during that admission to the hospital included a CBC showed an elevated white count of 13,100.  There is no further evaluation for it her lungs showed no evidence of infiltrate.  Her renal function was normal creatinine 0.7 potassium 3.7 liver function normal troponin undetectable proBNP was low at 60.  Her  history with me is quite different than the emergency room she describes her chest pain as mild to moderate nagging nonexertional located at the inferior sternal costochondral junction on the left and associated with chest wall tenderness she said there was no radiation no shortness of breath in her opinion she was little  surprised she was admitted to the hospital.  She does heavy lifting at home but she also has a seizure disorder and she has had recent seizure.  She has no known history of chest trauma and no history of congenital or rheumatic heart disease.  Her cardiovascular risk factors include her diabetes hyperlipidemia hypertension and coronary artery calcification.  In summary she is not having anginal discomfort.  She is a vigorous active woman and has had no exertional chest pain shortness of breath palpitations syncope or TIA.  She had no preceding fever or chills no chest pain is not pleuritic in nature.  Her CTA showed no evidence of pericardial disease or pulmonary embolism.  Unfortunately she is intolerant of nonsteroidal anti-inflammatory drugs I asked her to use Tylenol and if the symptoms worsen we can refer her to physical therapy modalities for costo chondritis.  Past Medical History:  Diagnosis Date  . Asthma   . Diabetes ([place])   . Epilepsy ([place])   . Hypertension   . Sleep apnea     Past Surgical History:  Procedure Laterality Date  . APPENDECTOMY    . TONSILLECTOMY      Current Medications: Current Meds  Medication Sig  . atorvastatin (LIPITOR) 40 MG tablet Take 40 mg by mouth daily.   . cetirizine (ZYRTEC) 10 MG tablet Take 10 mg by mouth daily as needed.  . clotrimazole-betamethasone (LOTRISONE) cream Apply 1 application topically daily as needed.   . fluticasone (FLONASE) 50 MCG/ACT nasal spray Use one spray in each nostril twice daily as directed.  Marland Kitchen glipiZIDE (GLUCOTROL) 5 MG tablet Take 5 mg by mouth daily before breakfast.   . levETIRAcetam (KEPPRA) 500 MG tablet Take 1,000 mg by mouth 3 (three) times daily.   Marland Kitchen losartan-hydrochlorothiazide (HYZAAR) 100-25 MG tablet Take 1 tablet by mouth daily.   . montelukast (SINGULAIR) 10 MG tablet TAKE ONE TABLET BY MOUTH DAILY AS DIRECTED  . potassium chloride (MICRO-K) 10 MEQ CR capsule Take 10 mEq by mouth daily.   Marland Kitchen VIMPAT 200 MG  TABS tablet Take 300 mg by mouth 2 (two) times daily.      Allergies:   Levofloxacin; Penicillins; Clarithromycin; and Sulfamethoxazole-trimethoprim   Social History   Socioeconomic History  . Marital status: Married    Spouse name: Not on file  . Number of children: Not on file  . Years of education: Not on file  . Highest education level: Not on file  Occupational History  . Not on file  Social Needs  . Financial resource strain: Not on file  . Food insecurity:    Worry: Not on file    Inability: Not on file  . Transportation needs:    Medical: Not on file    Non-medical: Not on file  Tobacco Use  . Smoking status: Never Smoker  . Smokeless tobacco: Never Used  Substance and Sexual Activity  . Alcohol use: Not Currently    Frequency: Never  . Drug use: Never  . Sexual activity: Not on file  Lifestyle  . Physical activity:    Days per week: Not on file    Minutes per session: Not on file  . Stress:  Not on file  Relationships  . Social connections:    Talks on phone: Not on file    Gets together: Not on file    Attends religious service: Not on file    Active member of club or organization: Not on file    Attends meetings of clubs or organizations: Not on file    Relationship status: Not on file  Other Topics Concern  . Not on file  Social History Narrative  . Not on file     Family History: The patient's family history includes Allergic rhinitis in her sister; Cancer in her maternal grandmother, maternal uncle, mother, and paternal grandmother; Diabetes in her father and paternal grandmother; Hypertension in her father and sister. ROS:   Please see the history of present illness.    All other systems reviewed and are negative.  Except for purposeful weight loss seasonal allergies with nasal congestion recent seizure and esophageal reflux with dyspepsia and heartburn.  Father pulmonary cardiac GI GU musculoskeletal neurologic and systemic are all normal   EKGs/Labs/Other Studies Reviewed:    The following studies were reviewed today:  EKG:  EKG independently reviewed [facility] sinus rhythm no ischemic changes  Recent Labs: 05/27/2018: Hemoglobin 11.9; Platelets 484  Recent Lipid Panel No results found for: CHOL, TRIG, HDL, CHOLHDL, VLDL, LDLCALC, LDLDIRECT  Physical Exam:    VS:  BP 110/78 (BP Location: Right Arm, Patient Position: Sitting, Cuff Size: Large)   Pulse 79   Ht 5\' 6"  (1.676 m)   Wt 238 lb (108 kg)   SpO2 97%   BMI 38.41 kg/m     Wt Readings from Last 3 Encounters:  08/01/18 238 lb (108 kg)  05/25/18 240 lb 9.6 oz (109.1 kg)  07/10/16 244 lb (110.7 kg)     GEN:  Well nourished, well developed in no acute distress HEENT: Normal NECK: No JVD; No carotid bruits LYMPHATICS: No lymphadenopathy CARDIAC: She has tenderness costochondral junction bilaterally left greater than right reproduces her clinical syndrome RRR, no murmurs, rubs, gallops RESPIRATORY:  Clear to auscultation without rales, wheezing or rhonchi  ABDOMEN: Soft, non-tender, non-distended MUSCULOSKELETAL:  No edema; No deformity  SKIN: Warm and dry NEUROLOGIC:  Alert and oriented x 3 PSYCHIATRIC:  Normal affect    Signed, Shirlee More, MD  08/01/2018 9:59 AM    [place]

## 2018-08-01 ENCOUNTER — Other Ambulatory Visit: Payer: Self-pay

## 2018-08-01 ENCOUNTER — Encounter: Payer: Self-pay | Admitting: Cardiology

## 2018-08-01 ENCOUNTER — Ambulatory Visit (INDEPENDENT_AMBULATORY_CARE_PROVIDER_SITE_OTHER): Payer: Medicare HMO | Admitting: Cardiology

## 2018-08-01 DIAGNOSIS — E782 Mixed hyperlipidemia: Secondary | ICD-10-CM | POA: Diagnosis not present

## 2018-08-01 DIAGNOSIS — I119 Hypertensive heart disease without heart failure: Secondary | ICD-10-CM | POA: Insufficient documentation

## 2018-08-01 DIAGNOSIS — R079 Chest pain, unspecified: Secondary | ICD-10-CM | POA: Insufficient documentation

## 2018-08-01 DIAGNOSIS — E119 Type 2 diabetes mellitus without complications: Secondary | ICD-10-CM | POA: Diagnosis not present

## 2018-08-01 DIAGNOSIS — I2510 Atherosclerotic heart disease of native coronary artery without angina pectoris: Secondary | ICD-10-CM | POA: Diagnosis not present

## 2018-08-01 DIAGNOSIS — I10 Essential (primary) hypertension: Secondary | ICD-10-CM

## 2018-08-01 DIAGNOSIS — E785 Hyperlipidemia, unspecified: Secondary | ICD-10-CM | POA: Insufficient documentation

## 2018-08-01 NOTE — Patient Instructions (Addendum)
Medication Instructions:  Your physician recommends that you continue on your current medications as directed. Please refer to the Current Medication list given to you today.  **Please call our office to update [place] on your medications!   If you need a refill on your cardiac medications before your next appointment, please call your pharmacy.   Lab work: None  If you have labs (blood work) drawn today and your tests are completely normal, you will receive your results only by: Marland Kitchen MyChart Message (if you have MyChart) OR . A paper copy in the mail If you have any lab test that is abnormal or we need to change your treatment, we will call you to review the results.  Testing/Procedures: None  Follow-Up: At [facility], you and your health needs are our priority.  As part of our continuing mission to provide you with exceptional heart care, we have created designated Provider Care Teams.  These Care Teams include your primary Cardiologist (physician) and Advanced Practice Providers (APPs -  Physician Assistants and Nurse Practitioners) who all work together to provide you with the care you need, when you need it. You will need a follow up appointment as needed if symptoms worsen or fail to improve.       Costochondritis Costochondritis is swelling and irritation (inflammation) of the tissue (cartilage) that connects your ribs to your breastbone (sternum). This causes pain in the front of your chest. Usually, the pain:  Starts gradually.  Is in more than one rib. This condition usually goes away on its own over time. Follow these instructions at home:  Do not do anything that makes your pain worse.  If directed, put ice on the painful area: ? Put ice in a plastic bag. ? Place a towel between your skin and the bag. ? Leave the ice on for 20 minutes, 2-3 times a day.  If directed, put heat on the affected area as often as told by your doctor. Use the heat source that your doctor  tells you to use, such as a moist heat pack or a heating pad. ? Place a towel between your skin and the heat source. ? Leave the heat on for 20-30 minutes. ? Take off the heat if your skin turns bright red. This is very important if you cannot feel pain, heat, or cold. You may have a greater risk of getting burned.  Take over-the-counter and prescription medicines only as told by your doctor.  Return to your normal activities as told by your doctor. Ask your doctor what activities are safe for you.  Keep all follow-up visits as told by your doctor. This is important. Contact a doctor if:  You have chills or a fever.  Your pain does not go away or it gets worse.  You have a cough that does not go away. Get help right away if:  You are short of breath. This information is not intended to replace advice given to you by your health care provider. Make sure you discuss any questions you have with your health care provider. Document Released: 10/21/2007 Document Revised: 11/22/2015 Document Reviewed: 08/28/2015 Elsevier Interactive Patient Education  [employer].  If you worsen we can refer to PT for iontophoreses of steroid

## 2018-08-01 NOTE — Addendum Note (Signed)
Addended by: Austin Miles on: 08/01/2018 11:48 AM   Modules accepted: Orders

## 2018-08-03 DIAGNOSIS — I96 Gangrene, not elsewhere classified: Secondary | ICD-10-CM | POA: Diagnosis not present

## 2018-08-03 DIAGNOSIS — L89312 Pressure ulcer of right buttock, stage 2: Secondary | ICD-10-CM | POA: Diagnosis not present

## 2018-08-03 DIAGNOSIS — I10 Essential (primary) hypertension: Secondary | ICD-10-CM | POA: Diagnosis not present

## 2018-08-03 DIAGNOSIS — E1152 Type 2 diabetes mellitus with diabetic peripheral angiopathy with gangrene: Secondary | ICD-10-CM | POA: Diagnosis not present

## 2018-08-08 DIAGNOSIS — L89312 Pressure ulcer of right buttock, stage 2: Secondary | ICD-10-CM | POA: Diagnosis not present

## 2018-08-08 DIAGNOSIS — E119 Type 2 diabetes mellitus without complications: Secondary | ICD-10-CM | POA: Diagnosis not present

## 2018-08-11 ENCOUNTER — Ambulatory Visit: Payer: Medicare HMO | Admitting: Allergy and Immunology

## 2018-08-11 DIAGNOSIS — G40909 Epilepsy, unspecified, not intractable, without status epilepticus: Secondary | ICD-10-CM | POA: Diagnosis not present

## 2018-08-11 DIAGNOSIS — I10 Essential (primary) hypertension: Secondary | ICD-10-CM | POA: Diagnosis not present

## 2018-08-11 DIAGNOSIS — E6609 Other obesity due to excess calories: Secondary | ICD-10-CM | POA: Diagnosis not present

## 2018-08-11 DIAGNOSIS — G4733 Obstructive sleep apnea (adult) (pediatric): Secondary | ICD-10-CM | POA: Diagnosis not present

## 2018-08-11 DIAGNOSIS — K219 Gastro-esophageal reflux disease without esophagitis: Secondary | ICD-10-CM | POA: Diagnosis not present

## 2018-08-11 DIAGNOSIS — L89312 Pressure ulcer of right buttock, stage 2: Secondary | ICD-10-CM | POA: Diagnosis not present

## 2018-08-11 DIAGNOSIS — J019 Acute sinusitis, unspecified: Secondary | ICD-10-CM | POA: Diagnosis not present

## 2018-08-11 DIAGNOSIS — M1991 Primary osteoarthritis, unspecified site: Secondary | ICD-10-CM | POA: Diagnosis not present

## 2018-08-11 DIAGNOSIS — E119 Type 2 diabetes mellitus without complications: Secondary | ICD-10-CM | POA: Diagnosis not present

## 2018-08-15 DIAGNOSIS — Z09 Encounter for follow-up examination after completed treatment for conditions other than malignant neoplasm: Secondary | ICD-10-CM | POA: Diagnosis not present

## 2018-08-15 DIAGNOSIS — L89312 Pressure ulcer of right buttock, stage 2: Secondary | ICD-10-CM | POA: Diagnosis not present

## 2018-08-15 DIAGNOSIS — Z872 Personal history of diseases of the skin and subcutaneous tissue: Secondary | ICD-10-CM | POA: Diagnosis not present

## 2018-08-18 DIAGNOSIS — G4733 Obstructive sleep apnea (adult) (pediatric): Secondary | ICD-10-CM | POA: Diagnosis not present

## 2018-08-18 DIAGNOSIS — E119 Type 2 diabetes mellitus without complications: Secondary | ICD-10-CM | POA: Diagnosis not present

## 2018-08-18 DIAGNOSIS — K219 Gastro-esophageal reflux disease without esophagitis: Secondary | ICD-10-CM | POA: Diagnosis not present

## 2018-08-18 DIAGNOSIS — G40909 Epilepsy, unspecified, not intractable, without status epilepticus: Secondary | ICD-10-CM | POA: Diagnosis not present

## 2018-08-18 DIAGNOSIS — E6609 Other obesity due to excess calories: Secondary | ICD-10-CM | POA: Diagnosis not present

## 2018-08-18 DIAGNOSIS — I10 Essential (primary) hypertension: Secondary | ICD-10-CM | POA: Diagnosis not present

## 2018-08-18 DIAGNOSIS — J019 Acute sinusitis, unspecified: Secondary | ICD-10-CM | POA: Diagnosis not present

## 2018-08-18 DIAGNOSIS — M1991 Primary osteoarthritis, unspecified site: Secondary | ICD-10-CM | POA: Diagnosis not present

## 2018-08-18 DIAGNOSIS — L89312 Pressure ulcer of right buttock, stage 2: Secondary | ICD-10-CM | POA: Diagnosis not present

## 2018-08-22 ENCOUNTER — Encounter: Payer: Self-pay | Admitting: Allergy and Immunology

## 2018-08-22 ENCOUNTER — Other Ambulatory Visit: Payer: Self-pay

## 2018-08-22 ENCOUNTER — Ambulatory Visit (INDEPENDENT_AMBULATORY_CARE_PROVIDER_SITE_OTHER): Payer: Medicare HMO | Admitting: Allergy and Immunology

## 2018-08-22 VITALS — BP 138/82 | HR 72 | Resp 20

## 2018-08-22 DIAGNOSIS — J3089 Other allergic rhinitis: Secondary | ICD-10-CM

## 2018-08-22 DIAGNOSIS — K219 Gastro-esophageal reflux disease without esophagitis: Secondary | ICD-10-CM

## 2018-08-22 DIAGNOSIS — J324 Chronic pansinusitis: Secondary | ICD-10-CM

## 2018-08-22 NOTE — Progress Notes (Signed)
[place]   Follow-up Note  Referring Provider: Townsend Roger, MD Primary Provider: Nona Dell, Corene Cornea, MD Date of Office Visit: 08/22/2018  Subjective:   Alicia Kelly (DOB: [birth year]) is a 63 y.o. female who returns to the Allergy and [place] on 08/22/2018 in re-evaluation of the following:  HPI: Alicia Kelly returns to this clinic in reevaluation of chronic sinusitis and a history of allergic rhinitis and reflux induced respiratory disease.  Her last visit to this clinic was 13 July 2018.  During her last visit we started her on a 30-day course of clindamycin and topical nasal mupirocin.  She is definitely better regarding her upper airway at this point in time.  She is blowing much less material out of her nose.  Her drainage is at least 50% improved.  She can now smell food on the table with no problem.  Her reflux appears to be under very good control at this point in time.  In February she was evaluated at [facility] for chest pain and subsequently saw her cardiologist, Dr. Bettina Gavia, who released her from any further care at this point in time.  There was a plan to have her undergo a sleep study as directed by her primary care doctor but to date that study has not been performed.  Apparently they would like to wait until her upper airway issue is completely treated.  Allergies as of 08/22/2018      Reactions   Levofloxacin Other (See Comments)   Throat swelling   Penicillins Anaphylaxis   Clarithromycin Other (See Comments)   seizures   Sulfamethoxazole-trimethoprim    Causes seizures      Medication List      amLODipine 10 MG tablet Commonly known as:  NORVASC   atorvastatin 40 MG tablet Commonly known as:  LIPITOR Take 40 mg by mouth daily.   cetirizine 10 MG tablet Commonly known as:  ZYRTEC Take 10 mg by mouth daily as needed for allergies.   fluticasone 50 MCG/ACT nasal spray Commonly known as:   FLONASE Place 1 spray into both nostrils daily.   glipiZIDE 5 MG tablet Commonly known as:  GLUCOTROL Take 5 mg by mouth daily before breakfast.   levETIRAcetam 500 MG tablet Commonly known as:  KEPPRA Take 1,000 mg by mouth 3 (three) times daily.   losartan-hydrochlorothiazide 100-25 MG tablet Commonly known as:  HYZAAR Take 1 tablet by mouth daily.   montelukast 10 MG tablet Commonly known as:  SINGULAIR TAKE ONE TABLET BY MOUTH DAILY AS DIRECTED   omeprazole 40 MG capsule Commonly known as:  PRILOSEC Take 40 mg by mouth 2 (two) times daily.   potassium chloride 10 MEQ CR capsule Commonly known as:  MICRO-K Take 10 mEq by mouth daily.   Vimpat 200 MG Tabs tablet Generic drug:  lacosamide Take 600 mg by mouth 2 (two) times daily.       Past Medical History:  Diagnosis Date  . Asthma   . Diabetes ([place])   . Epilepsy ([place])   . Hypertension   . Sleep apnea     Past Surgical History:  Procedure Laterality Date  . APPENDECTOMY    . TONSILLECTOMY      Review of systems negative except as noted in HPI / PMHx or noted below:  Review of Systems  Constitutional: Negative.   HENT: Negative.   Eyes: Negative.   Respiratory: Negative.   Cardiovascular: Negative.   Gastrointestinal:  Negative.   Genitourinary: Negative.   Musculoskeletal: Negative.   Skin: Negative.   Neurological: Negative.   Endo/Heme/Allergies: Negative.   Psychiatric/Behavioral: Negative.      Objective:   Vitals:   08/22/18 0949  BP: 138/82  Pulse: 72  Resp: 20  SpO2: 97%          Physical Exam Constitutional:      Appearance: She is not diaphoretic.  HENT:     Head: Normocephalic.     Right Ear: Tympanic membrane, ear canal and external ear normal.     Left Ear: Tympanic membrane, ear canal and external ear normal.     Nose: Nose normal. No mucosal edema or rhinorrhea.     Mouth/Throat:     Pharynx: Uvula midline. No oropharyngeal exudate.  Eyes:     Conjunctiva/sclera:  Conjunctivae normal.  Neck:     Thyroid: No thyromegaly.     Trachea: Trachea normal. No tracheal tenderness or tracheal deviation.  Cardiovascular:     Rate and Rhythm: Normal rate and regular rhythm.     Heart sounds: Normal heart sounds, S1 normal and S2 normal. No murmur.  Pulmonary:     Effort: No respiratory distress.     Breath sounds: Normal breath sounds. No stridor. No wheezing or rales.  Lymphadenopathy:     Head:     Right side of head: No tonsillar adenopathy.     Left side of head: No tonsillar adenopathy.     Cervical: No cervical adenopathy.  Skin:    Findings: No erythema or rash.     Nails: There is no clubbing.   Neurological:     Mental Status: She is alert.     Diagnostics:   Assessment and Plan:   1. Chronic pansinusitis   2. Other allergic rhinitis   3. LPRD (laryngopharyngeal reflux disease)     1. Continue to Treat and prevent inflammation:   A. Flonase one spray each nostril twice a day  B. montelukast 10 mg tablet 1 time per day  2. Continue to Treat and prevent reflux:   A. Omeprazole 40mg  TWO times per day  3. If needed:   A. cetirizine 10 mg tablet 1 time per day  B. Nasal saline spray / wash  4. Obtain Pneumovax vaccination and repeat Pneumo -23 blood test 6 weeks after vaccination  5.  Return to clinic in 12 weeks or earlier if problem  I think that Alicia Kelly is doing better after aggressive therapy directed against her chronic sinusitis and continuing to use medications directed against respiratory tract inflammation and reflux as noted above.  She did have relatively low titers of antibodies directed against multiple serotypes of pneumococcus in the past and I have asked her to obtain a Pneumovax and we will repeat these titers in 6 weeks after vaccination to see if she has a good immunological response against these immunogens.  I will see her back in this clinic in 12 weeks or earlier if there is a problem.  Allena Katz, MD  Allergy / Immunology [place]

## 2018-08-22 NOTE — Patient Instructions (Signed)
  1. Continue to Treat and prevent inflammation:   A. Flonase one spray each nostril twice a day  B. montelukast 10 mg tablet 1 time per day  2. Continue to Treat and prevent reflux:   A. Omeprazole 40mg  TWO times per day  3. If needed:   A. cetirizine 10 mg tablet 1 time per day  B. Nasal saline spray / wash  4. Obtain Pneumovax vaccination and repeat Pneumo -23 blood test 6 weeks after vaccination  5.  Return to clinic in 12 weeks or earlier if problem

## 2018-08-23 ENCOUNTER — Encounter: Payer: Self-pay | Admitting: Allergy and Immunology

## 2018-08-25 DIAGNOSIS — E119 Type 2 diabetes mellitus without complications: Secondary | ICD-10-CM | POA: Diagnosis not present

## 2018-08-25 DIAGNOSIS — G4733 Obstructive sleep apnea (adult) (pediatric): Secondary | ICD-10-CM | POA: Diagnosis not present

## 2018-08-25 DIAGNOSIS — K219 Gastro-esophageal reflux disease without esophagitis: Secondary | ICD-10-CM | POA: Diagnosis not present

## 2018-08-25 DIAGNOSIS — J019 Acute sinusitis, unspecified: Secondary | ICD-10-CM | POA: Diagnosis not present

## 2018-08-25 DIAGNOSIS — L89312 Pressure ulcer of right buttock, stage 2: Secondary | ICD-10-CM | POA: Diagnosis not present

## 2018-08-25 DIAGNOSIS — G40909 Epilepsy, unspecified, not intractable, without status epilepticus: Secondary | ICD-10-CM | POA: Diagnosis not present

## 2018-08-25 DIAGNOSIS — E6609 Other obesity due to excess calories: Secondary | ICD-10-CM | POA: Diagnosis not present

## 2018-08-25 DIAGNOSIS — I10 Essential (primary) hypertension: Secondary | ICD-10-CM | POA: Diagnosis not present

## 2018-08-25 DIAGNOSIS — M1991 Primary osteoarthritis, unspecified site: Secondary | ICD-10-CM | POA: Diagnosis not present

## 2018-08-26 DIAGNOSIS — I10 Essential (primary) hypertension: Secondary | ICD-10-CM | POA: Diagnosis not present

## 2018-08-26 DIAGNOSIS — K219 Gastro-esophageal reflux disease without esophagitis: Secondary | ICD-10-CM | POA: Diagnosis not present

## 2018-08-26 DIAGNOSIS — E6609 Other obesity due to excess calories: Secondary | ICD-10-CM | POA: Diagnosis not present

## 2018-08-26 DIAGNOSIS — G4733 Obstructive sleep apnea (adult) (pediatric): Secondary | ICD-10-CM | POA: Diagnosis not present

## 2018-08-26 DIAGNOSIS — J019 Acute sinusitis, unspecified: Secondary | ICD-10-CM | POA: Diagnosis not present

## 2018-08-26 DIAGNOSIS — G40909 Epilepsy, unspecified, not intractable, without status epilepticus: Secondary | ICD-10-CM | POA: Diagnosis not present

## 2018-08-26 DIAGNOSIS — E119 Type 2 diabetes mellitus without complications: Secondary | ICD-10-CM | POA: Diagnosis not present

## 2018-08-26 DIAGNOSIS — L89312 Pressure ulcer of right buttock, stage 2: Secondary | ICD-10-CM | POA: Diagnosis not present

## 2018-08-26 DIAGNOSIS — M1991 Primary osteoarthritis, unspecified site: Secondary | ICD-10-CM | POA: Diagnosis not present

## 2018-09-01 DIAGNOSIS — K219 Gastro-esophageal reflux disease without esophagitis: Secondary | ICD-10-CM | POA: Diagnosis not present

## 2018-09-01 DIAGNOSIS — L89312 Pressure ulcer of right buttock, stage 2: Secondary | ICD-10-CM | POA: Diagnosis not present

## 2018-09-01 DIAGNOSIS — J019 Acute sinusitis, unspecified: Secondary | ICD-10-CM | POA: Diagnosis not present

## 2018-09-01 DIAGNOSIS — I10 Essential (primary) hypertension: Secondary | ICD-10-CM | POA: Diagnosis not present

## 2018-09-01 DIAGNOSIS — E6609 Other obesity due to excess calories: Secondary | ICD-10-CM | POA: Diagnosis not present

## 2018-09-01 DIAGNOSIS — G40909 Epilepsy, unspecified, not intractable, without status epilepticus: Secondary | ICD-10-CM | POA: Diagnosis not present

## 2018-09-01 DIAGNOSIS — G4733 Obstructive sleep apnea (adult) (pediatric): Secondary | ICD-10-CM | POA: Diagnosis not present

## 2018-09-01 DIAGNOSIS — E119 Type 2 diabetes mellitus without complications: Secondary | ICD-10-CM | POA: Diagnosis not present

## 2018-09-01 DIAGNOSIS — M1991 Primary osteoarthritis, unspecified site: Secondary | ICD-10-CM | POA: Diagnosis not present

## 2018-09-05 ENCOUNTER — Telehealth: Payer: Self-pay | Admitting: *Deleted

## 2018-09-05 NOTE — Telephone Encounter (Signed)
Patient informed.  She has not received the Pneumovax yet.  She will remind PCP about it again.

## 2018-09-05 NOTE — Telephone Encounter (Signed)
Please inform patient that it would probably be best for her to be seen by ENT, Dr. Laurance Flatten, once again so he can directly assess the extent of her sinus involvement and make a decision about what needs to be done with her chronic sinusitis.  Did she receive her Pneumovax yet?

## 2018-09-05 NOTE — Telephone Encounter (Signed)
Yesli calls stating that she is having problems with her sinuses again. She has some congestion and runny nose.  She is "spitting up yellow fleam" and having some bloody nasal discharge again.  She wants to know if she needs to go to her PCP or come back here.  Please advise.

## 2018-09-08 DIAGNOSIS — G4733 Obstructive sleep apnea (adult) (pediatric): Secondary | ICD-10-CM | POA: Diagnosis not present

## 2018-09-08 DIAGNOSIS — L89312 Pressure ulcer of right buttock, stage 2: Secondary | ICD-10-CM | POA: Diagnosis not present

## 2018-09-08 DIAGNOSIS — E6609 Other obesity due to excess calories: Secondary | ICD-10-CM | POA: Diagnosis not present

## 2018-09-08 DIAGNOSIS — E119 Type 2 diabetes mellitus without complications: Secondary | ICD-10-CM | POA: Diagnosis not present

## 2018-09-08 DIAGNOSIS — K219 Gastro-esophageal reflux disease without esophagitis: Secondary | ICD-10-CM | POA: Diagnosis not present

## 2018-09-08 DIAGNOSIS — I10 Essential (primary) hypertension: Secondary | ICD-10-CM | POA: Diagnosis not present

## 2018-09-08 DIAGNOSIS — M1991 Primary osteoarthritis, unspecified site: Secondary | ICD-10-CM | POA: Diagnosis not present

## 2018-09-08 DIAGNOSIS — G40909 Epilepsy, unspecified, not intractable, without status epilepticus: Secondary | ICD-10-CM | POA: Diagnosis not present

## 2018-09-08 DIAGNOSIS — J019 Acute sinusitis, unspecified: Secondary | ICD-10-CM | POA: Diagnosis not present

## 2018-09-15 DIAGNOSIS — K219 Gastro-esophageal reflux disease without esophagitis: Secondary | ICD-10-CM | POA: Diagnosis not present

## 2018-09-15 DIAGNOSIS — E6609 Other obesity due to excess calories: Secondary | ICD-10-CM | POA: Diagnosis not present

## 2018-09-15 DIAGNOSIS — G40909 Epilepsy, unspecified, not intractable, without status epilepticus: Secondary | ICD-10-CM | POA: Diagnosis not present

## 2018-09-15 DIAGNOSIS — L89312 Pressure ulcer of right buttock, stage 2: Secondary | ICD-10-CM | POA: Diagnosis not present

## 2018-09-15 DIAGNOSIS — M1991 Primary osteoarthritis, unspecified site: Secondary | ICD-10-CM | POA: Diagnosis not present

## 2018-09-15 DIAGNOSIS — J019 Acute sinusitis, unspecified: Secondary | ICD-10-CM | POA: Diagnosis not present

## 2018-09-15 DIAGNOSIS — I10 Essential (primary) hypertension: Secondary | ICD-10-CM | POA: Diagnosis not present

## 2018-09-15 DIAGNOSIS — G4733 Obstructive sleep apnea (adult) (pediatric): Secondary | ICD-10-CM | POA: Diagnosis not present

## 2018-09-15 DIAGNOSIS — E119 Type 2 diabetes mellitus without complications: Secondary | ICD-10-CM | POA: Diagnosis not present

## 2018-09-21 DIAGNOSIS — J019 Acute sinusitis, unspecified: Secondary | ICD-10-CM | POA: Diagnosis not present

## 2018-09-21 DIAGNOSIS — M1991 Primary osteoarthritis, unspecified site: Secondary | ICD-10-CM | POA: Diagnosis not present

## 2018-09-21 DIAGNOSIS — K219 Gastro-esophageal reflux disease without esophagitis: Secondary | ICD-10-CM | POA: Diagnosis not present

## 2018-09-21 DIAGNOSIS — I10 Essential (primary) hypertension: Secondary | ICD-10-CM | POA: Diagnosis not present

## 2018-09-21 DIAGNOSIS — E6609 Other obesity due to excess calories: Secondary | ICD-10-CM | POA: Diagnosis not present

## 2018-09-21 DIAGNOSIS — G4733 Obstructive sleep apnea (adult) (pediatric): Secondary | ICD-10-CM | POA: Diagnosis not present

## 2018-09-21 DIAGNOSIS — E119 Type 2 diabetes mellitus without complications: Secondary | ICD-10-CM | POA: Diagnosis not present

## 2018-09-21 DIAGNOSIS — L89312 Pressure ulcer of right buttock, stage 2: Secondary | ICD-10-CM | POA: Diagnosis not present

## 2018-09-21 DIAGNOSIS — G40909 Epilepsy, unspecified, not intractable, without status epilepticus: Secondary | ICD-10-CM | POA: Diagnosis not present

## 2018-09-22 DIAGNOSIS — E6609 Other obesity due to excess calories: Secondary | ICD-10-CM | POA: Diagnosis not present

## 2018-09-22 DIAGNOSIS — G4733 Obstructive sleep apnea (adult) (pediatric): Secondary | ICD-10-CM | POA: Diagnosis not present

## 2018-09-22 DIAGNOSIS — G40909 Epilepsy, unspecified, not intractable, without status epilepticus: Secondary | ICD-10-CM | POA: Diagnosis not present

## 2018-09-22 DIAGNOSIS — L89312 Pressure ulcer of right buttock, stage 2: Secondary | ICD-10-CM | POA: Diagnosis not present

## 2018-09-22 DIAGNOSIS — M1991 Primary osteoarthritis, unspecified site: Secondary | ICD-10-CM | POA: Diagnosis not present

## 2018-09-22 DIAGNOSIS — K219 Gastro-esophageal reflux disease without esophagitis: Secondary | ICD-10-CM | POA: Diagnosis not present

## 2018-09-22 DIAGNOSIS — I10 Essential (primary) hypertension: Secondary | ICD-10-CM | POA: Diagnosis not present

## 2018-09-22 DIAGNOSIS — E119 Type 2 diabetes mellitus without complications: Secondary | ICD-10-CM | POA: Diagnosis not present

## 2018-09-22 DIAGNOSIS — J019 Acute sinusitis, unspecified: Secondary | ICD-10-CM | POA: Diagnosis not present

## 2018-09-29 DIAGNOSIS — K219 Gastro-esophageal reflux disease without esophagitis: Secondary | ICD-10-CM | POA: Diagnosis not present

## 2018-09-29 DIAGNOSIS — E119 Type 2 diabetes mellitus without complications: Secondary | ICD-10-CM | POA: Diagnosis not present

## 2018-09-29 DIAGNOSIS — G4733 Obstructive sleep apnea (adult) (pediatric): Secondary | ICD-10-CM | POA: Diagnosis not present

## 2018-09-29 DIAGNOSIS — G40909 Epilepsy, unspecified, not intractable, without status epilepticus: Secondary | ICD-10-CM | POA: Diagnosis not present

## 2018-09-29 DIAGNOSIS — L89312 Pressure ulcer of right buttock, stage 2: Secondary | ICD-10-CM | POA: Diagnosis not present

## 2018-09-29 DIAGNOSIS — M1991 Primary osteoarthritis, unspecified site: Secondary | ICD-10-CM | POA: Diagnosis not present

## 2018-09-29 DIAGNOSIS — I10 Essential (primary) hypertension: Secondary | ICD-10-CM | POA: Diagnosis not present

## 2018-09-29 DIAGNOSIS — J019 Acute sinusitis, unspecified: Secondary | ICD-10-CM | POA: Diagnosis not present

## 2018-09-29 DIAGNOSIS — E6609 Other obesity due to excess calories: Secondary | ICD-10-CM | POA: Diagnosis not present

## 2018-10-03 DIAGNOSIS — M79671 Pain in right foot: Secondary | ICD-10-CM | POA: Diagnosis not present

## 2018-10-03 DIAGNOSIS — E1165 Type 2 diabetes mellitus with hyperglycemia: Secondary | ICD-10-CM | POA: Diagnosis not present

## 2018-10-06 DIAGNOSIS — E119 Type 2 diabetes mellitus without complications: Secondary | ICD-10-CM | POA: Diagnosis not present

## 2018-10-06 DIAGNOSIS — L89312 Pressure ulcer of right buttock, stage 2: Secondary | ICD-10-CM | POA: Diagnosis not present

## 2018-10-06 DIAGNOSIS — K219 Gastro-esophageal reflux disease without esophagitis: Secondary | ICD-10-CM | POA: Diagnosis not present

## 2018-10-06 DIAGNOSIS — E6609 Other obesity due to excess calories: Secondary | ICD-10-CM | POA: Diagnosis not present

## 2018-10-06 DIAGNOSIS — G4733 Obstructive sleep apnea (adult) (pediatric): Secondary | ICD-10-CM | POA: Diagnosis not present

## 2018-10-06 DIAGNOSIS — J019 Acute sinusitis, unspecified: Secondary | ICD-10-CM | POA: Diagnosis not present

## 2018-10-06 DIAGNOSIS — I10 Essential (primary) hypertension: Secondary | ICD-10-CM | POA: Diagnosis not present

## 2018-10-06 DIAGNOSIS — M1991 Primary osteoarthritis, unspecified site: Secondary | ICD-10-CM | POA: Diagnosis not present

## 2018-10-06 DIAGNOSIS — G40909 Epilepsy, unspecified, not intractable, without status epilepticus: Secondary | ICD-10-CM | POA: Diagnosis not present

## 2018-10-13 DIAGNOSIS — E6609 Other obesity due to excess calories: Secondary | ICD-10-CM | POA: Diagnosis not present

## 2018-10-13 DIAGNOSIS — M1991 Primary osteoarthritis, unspecified site: Secondary | ICD-10-CM | POA: Diagnosis not present

## 2018-10-13 DIAGNOSIS — K219 Gastro-esophageal reflux disease without esophagitis: Secondary | ICD-10-CM | POA: Diagnosis not present

## 2018-10-13 DIAGNOSIS — E119 Type 2 diabetes mellitus without complications: Secondary | ICD-10-CM | POA: Diagnosis not present

## 2018-10-13 DIAGNOSIS — L89312 Pressure ulcer of right buttock, stage 2: Secondary | ICD-10-CM | POA: Diagnosis not present

## 2018-10-13 DIAGNOSIS — I10 Essential (primary) hypertension: Secondary | ICD-10-CM | POA: Diagnosis not present

## 2018-10-13 DIAGNOSIS — G4733 Obstructive sleep apnea (adult) (pediatric): Secondary | ICD-10-CM | POA: Diagnosis not present

## 2018-10-13 DIAGNOSIS — J019 Acute sinusitis, unspecified: Secondary | ICD-10-CM | POA: Diagnosis not present

## 2018-10-13 DIAGNOSIS — G40909 Epilepsy, unspecified, not intractable, without status epilepticus: Secondary | ICD-10-CM | POA: Diagnosis not present

## 2018-10-14 DIAGNOSIS — L89312 Pressure ulcer of right buttock, stage 2: Secondary | ICD-10-CM | POA: Diagnosis not present

## 2018-10-14 DIAGNOSIS — E1165 Type 2 diabetes mellitus with hyperglycemia: Secondary | ICD-10-CM | POA: Diagnosis not present

## 2018-10-14 DIAGNOSIS — G40909 Epilepsy, unspecified, not intractable, without status epilepticus: Secondary | ICD-10-CM | POA: Diagnosis not present

## 2018-10-14 DIAGNOSIS — E6609 Other obesity due to excess calories: Secondary | ICD-10-CM | POA: Diagnosis not present

## 2018-10-14 DIAGNOSIS — G4733 Obstructive sleep apnea (adult) (pediatric): Secondary | ICD-10-CM | POA: Diagnosis not present

## 2018-10-14 DIAGNOSIS — I10 Essential (primary) hypertension: Secondary | ICD-10-CM | POA: Diagnosis not present

## 2018-10-14 DIAGNOSIS — E119 Type 2 diabetes mellitus without complications: Secondary | ICD-10-CM | POA: Diagnosis not present

## 2018-10-14 DIAGNOSIS — J019 Acute sinusitis, unspecified: Secondary | ICD-10-CM | POA: Diagnosis not present

## 2018-10-14 DIAGNOSIS — K219 Gastro-esophageal reflux disease without esophagitis: Secondary | ICD-10-CM | POA: Diagnosis not present

## 2018-10-14 DIAGNOSIS — M1991 Primary osteoarthritis, unspecified site: Secondary | ICD-10-CM | POA: Diagnosis not present

## 2018-10-20 DIAGNOSIS — G4733 Obstructive sleep apnea (adult) (pediatric): Secondary | ICD-10-CM | POA: Diagnosis not present

## 2018-10-20 DIAGNOSIS — E6609 Other obesity due to excess calories: Secondary | ICD-10-CM | POA: Diagnosis not present

## 2018-10-20 DIAGNOSIS — J019 Acute sinusitis, unspecified: Secondary | ICD-10-CM | POA: Diagnosis not present

## 2018-10-20 DIAGNOSIS — M1991 Primary osteoarthritis, unspecified site: Secondary | ICD-10-CM | POA: Diagnosis not present

## 2018-10-20 DIAGNOSIS — K219 Gastro-esophageal reflux disease without esophagitis: Secondary | ICD-10-CM | POA: Diagnosis not present

## 2018-10-20 DIAGNOSIS — G40909 Epilepsy, unspecified, not intractable, without status epilepticus: Secondary | ICD-10-CM | POA: Diagnosis not present

## 2018-10-20 DIAGNOSIS — I10 Essential (primary) hypertension: Secondary | ICD-10-CM | POA: Diagnosis not present

## 2018-10-20 DIAGNOSIS — L89312 Pressure ulcer of right buttock, stage 2: Secondary | ICD-10-CM | POA: Diagnosis not present

## 2018-10-20 DIAGNOSIS — E119 Type 2 diabetes mellitus without complications: Secondary | ICD-10-CM | POA: Diagnosis not present

## 2018-10-21 DIAGNOSIS — E119 Type 2 diabetes mellitus without complications: Secondary | ICD-10-CM | POA: Diagnosis not present

## 2018-10-21 DIAGNOSIS — G4733 Obstructive sleep apnea (adult) (pediatric): Secondary | ICD-10-CM | POA: Diagnosis not present

## 2018-10-21 DIAGNOSIS — E6609 Other obesity due to excess calories: Secondary | ICD-10-CM | POA: Diagnosis not present

## 2018-10-21 DIAGNOSIS — I10 Essential (primary) hypertension: Secondary | ICD-10-CM | POA: Diagnosis not present

## 2018-10-21 DIAGNOSIS — M1991 Primary osteoarthritis, unspecified site: Secondary | ICD-10-CM | POA: Diagnosis not present

## 2018-10-21 DIAGNOSIS — G40909 Epilepsy, unspecified, not intractable, without status epilepticus: Secondary | ICD-10-CM | POA: Diagnosis not present

## 2018-10-21 DIAGNOSIS — J019 Acute sinusitis, unspecified: Secondary | ICD-10-CM | POA: Diagnosis not present

## 2018-10-21 DIAGNOSIS — K219 Gastro-esophageal reflux disease without esophagitis: Secondary | ICD-10-CM | POA: Diagnosis not present

## 2018-10-21 DIAGNOSIS — L89312 Pressure ulcer of right buttock, stage 2: Secondary | ICD-10-CM | POA: Diagnosis not present

## 2018-10-25 ENCOUNTER — Other Ambulatory Visit: Payer: Self-pay | Admitting: Allergy and Immunology

## 2018-10-26 DIAGNOSIS — G40909 Epilepsy, unspecified, not intractable, without status epilepticus: Secondary | ICD-10-CM | POA: Diagnosis not present

## 2018-10-26 DIAGNOSIS — L89312 Pressure ulcer of right buttock, stage 2: Secondary | ICD-10-CM | POA: Diagnosis not present

## 2018-10-26 DIAGNOSIS — I10 Essential (primary) hypertension: Secondary | ICD-10-CM | POA: Diagnosis not present

## 2018-10-26 DIAGNOSIS — K219 Gastro-esophageal reflux disease without esophagitis: Secondary | ICD-10-CM | POA: Diagnosis not present

## 2018-10-26 DIAGNOSIS — J019 Acute sinusitis, unspecified: Secondary | ICD-10-CM | POA: Diagnosis not present

## 2018-10-26 DIAGNOSIS — E6609 Other obesity due to excess calories: Secondary | ICD-10-CM | POA: Diagnosis not present

## 2018-10-26 DIAGNOSIS — M1991 Primary osteoarthritis, unspecified site: Secondary | ICD-10-CM | POA: Diagnosis not present

## 2018-10-26 DIAGNOSIS — G4733 Obstructive sleep apnea (adult) (pediatric): Secondary | ICD-10-CM | POA: Diagnosis not present

## 2018-10-26 DIAGNOSIS — E119 Type 2 diabetes mellitus without complications: Secondary | ICD-10-CM | POA: Diagnosis not present

## 2018-11-03 DIAGNOSIS — K219 Gastro-esophageal reflux disease without esophagitis: Secondary | ICD-10-CM | POA: Diagnosis not present

## 2018-11-03 DIAGNOSIS — I10 Essential (primary) hypertension: Secondary | ICD-10-CM | POA: Diagnosis not present

## 2018-11-03 DIAGNOSIS — E119 Type 2 diabetes mellitus without complications: Secondary | ICD-10-CM | POA: Diagnosis not present

## 2018-11-03 DIAGNOSIS — G4733 Obstructive sleep apnea (adult) (pediatric): Secondary | ICD-10-CM | POA: Diagnosis not present

## 2018-11-03 DIAGNOSIS — L89312 Pressure ulcer of right buttock, stage 2: Secondary | ICD-10-CM | POA: Diagnosis not present

## 2018-11-03 DIAGNOSIS — G40909 Epilepsy, unspecified, not intractable, without status epilepticus: Secondary | ICD-10-CM | POA: Diagnosis not present

## 2018-11-03 DIAGNOSIS — J019 Acute sinusitis, unspecified: Secondary | ICD-10-CM | POA: Diagnosis not present

## 2018-11-03 DIAGNOSIS — M1991 Primary osteoarthritis, unspecified site: Secondary | ICD-10-CM | POA: Diagnosis not present

## 2018-11-03 DIAGNOSIS — E6609 Other obesity due to excess calories: Secondary | ICD-10-CM | POA: Diagnosis not present

## 2018-11-10 DIAGNOSIS — G4733 Obstructive sleep apnea (adult) (pediatric): Secondary | ICD-10-CM | POA: Diagnosis not present

## 2018-11-10 DIAGNOSIS — I10 Essential (primary) hypertension: Secondary | ICD-10-CM | POA: Diagnosis not present

## 2018-11-10 DIAGNOSIS — G40909 Epilepsy, unspecified, not intractable, without status epilepticus: Secondary | ICD-10-CM | POA: Diagnosis not present

## 2018-11-10 DIAGNOSIS — K219 Gastro-esophageal reflux disease without esophagitis: Secondary | ICD-10-CM | POA: Diagnosis not present

## 2018-11-10 DIAGNOSIS — E6609 Other obesity due to excess calories: Secondary | ICD-10-CM | POA: Diagnosis not present

## 2018-11-10 DIAGNOSIS — M1991 Primary osteoarthritis, unspecified site: Secondary | ICD-10-CM | POA: Diagnosis not present

## 2018-11-10 DIAGNOSIS — L89312 Pressure ulcer of right buttock, stage 2: Secondary | ICD-10-CM | POA: Diagnosis not present

## 2018-11-10 DIAGNOSIS — J019 Acute sinusitis, unspecified: Secondary | ICD-10-CM | POA: Diagnosis not present

## 2018-11-10 DIAGNOSIS — E119 Type 2 diabetes mellitus without complications: Secondary | ICD-10-CM | POA: Diagnosis not present

## 2018-11-14 ENCOUNTER — Other Ambulatory Visit: Payer: Self-pay

## 2018-11-14 ENCOUNTER — Ambulatory Visit (INDEPENDENT_AMBULATORY_CARE_PROVIDER_SITE_OTHER): Payer: Medicare HMO | Admitting: Allergy and Immunology

## 2018-11-14 ENCOUNTER — Encounter: Payer: Self-pay | Admitting: Allergy and Immunology

## 2018-11-14 VITALS — BP 128/80 | HR 72 | Temp 98.1°F | Resp 16

## 2018-11-14 DIAGNOSIS — J324 Chronic pansinusitis: Secondary | ICD-10-CM

## 2018-11-14 DIAGNOSIS — J3089 Other allergic rhinitis: Secondary | ICD-10-CM | POA: Diagnosis not present

## 2018-11-14 DIAGNOSIS — K219 Gastro-esophageal reflux disease without esophagitis: Secondary | ICD-10-CM

## 2018-11-14 NOTE — Progress Notes (Signed)
[place]   Follow-up Note  Referring Provider: Townsend Roger, MD Primary Provider: Nona Dell, Corene Cornea, MD Date of Office Visit: 11/14/2018  Subjective:   Alicia Kelly (DOB: Mar 24, 1956) is a 63 y.o. female who returns to the Allergy and [place] on 11/14/2018 in re-evaluation of the following:  HPI: Rainah returns to this clinic in reevaluation of her allergic rhinitis and chronic sinusitis and reflux induced respiratory disease.  Her last visit to this clinic was 22 August 2018.  She has done very well since her last visit.  Other than some occasional hoarseness and slight sore throat that has developed over the course of the past week after visiting her sister in [place] last week she has had very little upper or throat symptoms.  She does not blow out any ugly nasal discharge from her nose.  She feels as though her nose is open.  She has very little drainage.  She can smell without any problem.  She has not been having any issues with reflux.  She has not received her Pneumovax yet.  Allergies as of 11/14/2018      Reactions   Levofloxacin Other (See Comments)   Throat swelling   Penicillins Anaphylaxis   Clarithromycin Other (See Comments)   seizures   Sulfamethoxazole-trimethoprim    Causes seizures      Medication List      amLODipine 10 MG tablet Commonly known as: NORVASC   atorvastatin 40 MG tablet Commonly known as: LIPITOR Take 40 mg by mouth daily.   cetirizine 10 MG tablet Commonly known as: ZYRTEC Take 10 mg by mouth daily as needed for allergies.   fluticasone 50 MCG/ACT nasal spray Commonly known as: FLONASE USE 1 SPRAY IN EACH NOSTRIL TWICE DAILY AS DIRECTED   glipiZIDE 5 MG tablet Commonly known as: GLUCOTROL Take 5 mg by mouth daily before breakfast.   levETIRAcetam 500 MG tablet Commonly known as: KEPPRA Take 1,000 mg by mouth 3 (three) times daily.   losartan-hydrochlorothiazide  100-25 MG tablet Commonly known as: HYZAAR Take 1 tablet by mouth daily.   montelukast 10 MG tablet Commonly known as: SINGULAIR TAKE ONE TABLET BY MOUTH DAILY AS DIRECTED   omeprazole 40 MG capsule Commonly known as: PRILOSEC Take 40 mg by mouth 2 (two) times daily.   potassium chloride 10 MEQ CR capsule Commonly known as: MICRO-K Take 10 mEq by mouth daily.   Vimpat 200 MG Tabs tablet Generic drug: lacosamide Take 600 mg by mouth 2 (two) times daily.       Past Medical History:  Diagnosis Date  . Asthma   . Diabetes ([place])   . Epilepsy ([place])   . Hypertension   . Sleep apnea     Past Surgical History:  Procedure Laterality Date  . APPENDECTOMY    . TONSILLECTOMY      Review of systems negative except as noted in HPI / PMHx or noted below:  Review of Systems  Constitutional: Negative.   HENT: Negative.   Eyes: Negative.   Respiratory: Negative.   Cardiovascular: Negative.   Gastrointestinal: Negative.   Genitourinary: Negative.   Musculoskeletal: Negative.   Skin: Negative.   Neurological: Negative.   Endo/Heme/Allergies: Negative.   Psychiatric/Behavioral: Negative.      Objective:   Vitals:   11/14/18 0934  BP: 128/80  Pulse: 72  Resp: 16  Temp: 98.1 F (36.7 C)  SpO2: 98%  Physical Exam Constitutional:      Appearance: She is not diaphoretic.  HENT:     Head: Normocephalic.     Right Ear: Tympanic membrane, ear canal and external ear normal.     Left Ear: Tympanic membrane, ear canal and external ear normal.     Nose: Nose normal. No mucosal edema or rhinorrhea.     Mouth/Throat:     Pharynx: Uvula midline. No oropharyngeal exudate.  Eyes:     Conjunctiva/sclera: Conjunctivae normal.  Neck:     Thyroid: No thyromegaly.     Trachea: Trachea normal. No tracheal tenderness or tracheal deviation.  Cardiovascular:     Rate and Rhythm: Normal rate and regular rhythm.     Heart sounds: Normal heart sounds, S1 normal and S2  normal. No murmur.  Pulmonary:     Effort: No respiratory distress.     Breath sounds: Normal breath sounds. No stridor. No wheezing or rales.  Lymphadenopathy:     Head:     Right side of head: No tonsillar adenopathy.     Left side of head: No tonsillar adenopathy.     Cervical: No cervical adenopathy.  Skin:    Findings: No erythema or rash.     Nails: There is no clubbing.   Neurological:     Mental Status: She is alert.     Diagnostics: none  Assessment and Plan:   1. Other allergic rhinitis   2. Chronic pansinusitis   3. LPRD (laryngopharyngeal reflux disease)     1. Continue to Treat and prevent inflammation:   A. Flonase one spray each nostril twice a day  B. montelukast 10 mg tablet 1 time per day  2. Continue to Treat and prevent reflux:   A. Omeprazole 40mg  TWO times per day  3. If needed:   A. cetirizine 10 mg tablet 1 time per day  B. Nasal saline spray / wash  4. Obtain Pneumovax vaccination and repeat Pneumo -23 blood test 6 weeks after vaccination  5.  Return to clinic in 6 months or earlier if problem  6. Obtain fall flu vaccine  Cristalle appears to be doing quite well on her current therapy and we will keep her on anti-inflammatory agents for her airway and therapy directed against reflux as noted above.  Given her low titers of antibodies directed against multiple serotypes of pneumococcus I did make the recommendation once again that she obtain the Pneumovax.  We will make sure that she has had an adequate response to Pneumovax administration by rechecking her serotype titers.  I will see her back in this clinic in 6 months or earlier if there is a problem.  Allena Katz, MD Allergy / Immunology [place]

## 2018-11-14 NOTE — Patient Instructions (Addendum)
  1. Continue to Treat and prevent inflammation:   A. Flonase one spray each nostril twice a day  B. montelukast 10 mg tablet 1 time per day  2. Continue to Treat and prevent reflux:   A. Omeprazole 40mg  TWO times per day  3. If needed:   A. cetirizine 10 mg tablet 1 time per day  B. Nasal saline spray / wash  4. Obtain Pneumovax vaccination and repeat Pneumo -23 blood test 6 weeks after vaccination  5.  Return to clinic in 6 months or earlier if problem  6. Obtain fall flu vaccine

## 2018-11-15 ENCOUNTER — Encounter: Payer: Self-pay | Admitting: Allergy and Immunology

## 2018-11-17 DIAGNOSIS — E6609 Other obesity due to excess calories: Secondary | ICD-10-CM | POA: Diagnosis not present

## 2018-11-17 DIAGNOSIS — G40909 Epilepsy, unspecified, not intractable, without status epilepticus: Secondary | ICD-10-CM | POA: Diagnosis not present

## 2018-11-17 DIAGNOSIS — H524 Presbyopia: Secondary | ICD-10-CM | POA: Diagnosis not present

## 2018-11-17 DIAGNOSIS — K219 Gastro-esophageal reflux disease without esophagitis: Secondary | ICD-10-CM | POA: Diagnosis not present

## 2018-11-17 DIAGNOSIS — E113293 Type 2 diabetes mellitus with mild nonproliferative diabetic retinopathy without macular edema, bilateral: Secondary | ICD-10-CM | POA: Diagnosis not present

## 2018-11-17 DIAGNOSIS — I10 Essential (primary) hypertension: Secondary | ICD-10-CM | POA: Diagnosis not present

## 2018-11-17 DIAGNOSIS — H5203 Hypermetropia, bilateral: Secondary | ICD-10-CM | POA: Diagnosis not present

## 2018-11-17 DIAGNOSIS — E119 Type 2 diabetes mellitus without complications: Secondary | ICD-10-CM | POA: Diagnosis not present

## 2018-11-17 DIAGNOSIS — L89312 Pressure ulcer of right buttock, stage 2: Secondary | ICD-10-CM | POA: Diagnosis not present

## 2018-11-17 DIAGNOSIS — H2513 Age-related nuclear cataract, bilateral: Secondary | ICD-10-CM | POA: Diagnosis not present

## 2018-11-17 DIAGNOSIS — G4733 Obstructive sleep apnea (adult) (pediatric): Secondary | ICD-10-CM | POA: Diagnosis not present

## 2018-11-17 DIAGNOSIS — M1991 Primary osteoarthritis, unspecified site: Secondary | ICD-10-CM | POA: Diagnosis not present

## 2018-11-17 DIAGNOSIS — J019 Acute sinusitis, unspecified: Secondary | ICD-10-CM | POA: Diagnosis not present

## 2018-11-24 DIAGNOSIS — G40909 Epilepsy, unspecified, not intractable, without status epilepticus: Secondary | ICD-10-CM | POA: Diagnosis not present

## 2018-11-24 DIAGNOSIS — E119 Type 2 diabetes mellitus without complications: Secondary | ICD-10-CM | POA: Diagnosis not present

## 2018-11-24 DIAGNOSIS — J019 Acute sinusitis, unspecified: Secondary | ICD-10-CM | POA: Diagnosis not present

## 2018-11-24 DIAGNOSIS — K219 Gastro-esophageal reflux disease without esophagitis: Secondary | ICD-10-CM | POA: Diagnosis not present

## 2018-11-24 DIAGNOSIS — L89312 Pressure ulcer of right buttock, stage 2: Secondary | ICD-10-CM | POA: Diagnosis not present

## 2018-11-24 DIAGNOSIS — E6609 Other obesity due to excess calories: Secondary | ICD-10-CM | POA: Diagnosis not present

## 2018-11-24 DIAGNOSIS — G4733 Obstructive sleep apnea (adult) (pediatric): Secondary | ICD-10-CM | POA: Diagnosis not present

## 2018-11-24 DIAGNOSIS — M1991 Primary osteoarthritis, unspecified site: Secondary | ICD-10-CM | POA: Diagnosis not present

## 2018-11-24 DIAGNOSIS — I10 Essential (primary) hypertension: Secondary | ICD-10-CM | POA: Diagnosis not present

## 2018-12-01 DIAGNOSIS — I10 Essential (primary) hypertension: Secondary | ICD-10-CM | POA: Diagnosis not present

## 2018-12-01 DIAGNOSIS — J019 Acute sinusitis, unspecified: Secondary | ICD-10-CM | POA: Diagnosis not present

## 2018-12-01 DIAGNOSIS — K219 Gastro-esophageal reflux disease without esophagitis: Secondary | ICD-10-CM | POA: Diagnosis not present

## 2018-12-01 DIAGNOSIS — G40909 Epilepsy, unspecified, not intractable, without status epilepticus: Secondary | ICD-10-CM | POA: Diagnosis not present

## 2018-12-01 DIAGNOSIS — M1991 Primary osteoarthritis, unspecified site: Secondary | ICD-10-CM | POA: Diagnosis not present

## 2018-12-01 DIAGNOSIS — G4733 Obstructive sleep apnea (adult) (pediatric): Secondary | ICD-10-CM | POA: Diagnosis not present

## 2018-12-01 DIAGNOSIS — L89312 Pressure ulcer of right buttock, stage 2: Secondary | ICD-10-CM | POA: Diagnosis not present

## 2018-12-01 DIAGNOSIS — E6609 Other obesity due to excess calories: Secondary | ICD-10-CM | POA: Diagnosis not present

## 2018-12-01 DIAGNOSIS — E119 Type 2 diabetes mellitus without complications: Secondary | ICD-10-CM | POA: Diagnosis not present

## 2018-12-14 DIAGNOSIS — G40309 Generalized idiopathic epilepsy and epileptic syndromes, not intractable, without status epilepticus: Secondary | ICD-10-CM | POA: Diagnosis not present

## 2018-12-14 DIAGNOSIS — R6889 Other general symptoms and signs: Secondary | ICD-10-CM | POA: Diagnosis not present

## 2018-12-14 DIAGNOSIS — G40209 Localization-related (focal) (partial) symptomatic epilepsy and epileptic syndromes with complex partial seizures, not intractable, without status epilepticus: Secondary | ICD-10-CM | POA: Diagnosis not present

## 2018-12-29 DIAGNOSIS — L89301 Pressure ulcer of unspecified buttock, stage 1: Secondary | ICD-10-CM | POA: Diagnosis not present

## 2019-01-02 DIAGNOSIS — E119 Type 2 diabetes mellitus without complications: Secondary | ICD-10-CM | POA: Diagnosis not present

## 2019-01-02 DIAGNOSIS — L89313 Pressure ulcer of right buttock, stage 3: Secondary | ICD-10-CM | POA: Diagnosis not present

## 2019-01-02 DIAGNOSIS — E7800 Pure hypercholesterolemia, unspecified: Secondary | ICD-10-CM | POA: Diagnosis not present

## 2019-01-02 DIAGNOSIS — G4730 Sleep apnea, unspecified: Secondary | ICD-10-CM | POA: Diagnosis not present

## 2019-01-02 DIAGNOSIS — Z9989 Dependence on other enabling machines and devices: Secondary | ICD-10-CM | POA: Diagnosis not present

## 2019-01-02 DIAGNOSIS — K219 Gastro-esophageal reflux disease without esophagitis: Secondary | ICD-10-CM | POA: Diagnosis not present

## 2019-01-02 DIAGNOSIS — I10 Essential (primary) hypertension: Secondary | ICD-10-CM | POA: Diagnosis not present

## 2019-01-02 DIAGNOSIS — G40909 Epilepsy, unspecified, not intractable, without status epilepticus: Secondary | ICD-10-CM | POA: Diagnosis not present

## 2019-01-02 DIAGNOSIS — M1990 Unspecified osteoarthritis, unspecified site: Secondary | ICD-10-CM | POA: Diagnosis not present

## 2019-01-02 DIAGNOSIS — L89322 Pressure ulcer of left buttock, stage 2: Secondary | ICD-10-CM | POA: Diagnosis not present

## 2019-01-09 DIAGNOSIS — N3090 Cystitis, unspecified without hematuria: Secondary | ICD-10-CM | POA: Diagnosis not present

## 2019-01-09 DIAGNOSIS — L89322 Pressure ulcer of left buttock, stage 2: Secondary | ICD-10-CM | POA: Diagnosis not present

## 2019-01-09 DIAGNOSIS — N3000 Acute cystitis without hematuria: Secondary | ICD-10-CM | POA: Diagnosis not present

## 2019-01-10 DIAGNOSIS — K148 Other diseases of tongue: Secondary | ICD-10-CM | POA: Diagnosis not present

## 2019-01-10 DIAGNOSIS — T50995A Adverse effect of other drugs, medicaments and biological substances, initial encounter: Secondary | ICD-10-CM | POA: Diagnosis not present

## 2019-01-11 DIAGNOSIS — E1165 Type 2 diabetes mellitus with hyperglycemia: Secondary | ICD-10-CM | POA: Diagnosis not present

## 2019-01-11 DIAGNOSIS — M7918 Myalgia, other site: Secondary | ICD-10-CM | POA: Diagnosis not present

## 2019-01-16 DIAGNOSIS — E119 Type 2 diabetes mellitus without complications: Secondary | ICD-10-CM | POA: Diagnosis not present

## 2019-01-16 DIAGNOSIS — L89322 Pressure ulcer of left buttock, stage 2: Secondary | ICD-10-CM | POA: Diagnosis not present

## 2019-01-16 DIAGNOSIS — I10 Essential (primary) hypertension: Secondary | ICD-10-CM | POA: Diagnosis not present

## 2019-01-27 DIAGNOSIS — J329 Chronic sinusitis, unspecified: Secondary | ICD-10-CM | POA: Diagnosis not present

## 2019-01-30 DIAGNOSIS — L89322 Pressure ulcer of left buttock, stage 2: Secondary | ICD-10-CM | POA: Diagnosis not present

## 2019-02-06 DIAGNOSIS — L89322 Pressure ulcer of left buttock, stage 2: Secondary | ICD-10-CM | POA: Diagnosis not present

## 2019-02-20 DIAGNOSIS — Z794 Long term (current) use of insulin: Secondary | ICD-10-CM | POA: Diagnosis not present

## 2019-02-20 DIAGNOSIS — R6889 Other general symptoms and signs: Secondary | ICD-10-CM | POA: Diagnosis not present

## 2019-02-20 DIAGNOSIS — E1165 Type 2 diabetes mellitus with hyperglycemia: Secondary | ICD-10-CM | POA: Diagnosis not present

## 2019-02-20 DIAGNOSIS — Z6838 Body mass index (BMI) 38.0-38.9, adult: Secondary | ICD-10-CM | POA: Diagnosis not present

## 2019-02-20 DIAGNOSIS — L89152 Pressure ulcer of sacral region, stage 2: Secondary | ICD-10-CM | POA: Diagnosis not present

## 2019-02-24 ENCOUNTER — Telehealth: Payer: Self-pay | Admitting: Allergy and Immunology

## 2019-02-24 NOTE — Telephone Encounter (Signed)
Alicia Kelly called in and states she needed a prescription for her Potassium Chloride capsules.  She states her PCP originally prescribed them to her but when she last saw Dr. Neldon Mc, he had asked her to increase it to 2 times a day.  She tried to refill them and the pharmacy suggested to her that she call and see if Dr. Neldon Mc will write a prescription for the Potassium Chloride capsules.  Please advise. She would like them sent to [facility].

## 2019-02-27 NOTE — Telephone Encounter (Signed)
Spoke with Alicia Kelly. [name] states she has already spoken to her PCP regarding the potassium supplement and she believes they are handling prescribing/refilling her prescription. She told us to disregard.

## 2019-02-27 NOTE — Telephone Encounter (Signed)
Please inform patient that we can refill her potassium prescription that is on file at the pharmacy for 1 month.  She must follow-up with her primary care doctor to get any further prescriptions regarding potassium supplementation.

## 2019-02-27 NOTE — Telephone Encounter (Signed)
Went through previous notes and no where does Dr. Neldon Mc state anything regarding potassium supplementation. Spoke with Dr. Neldon Mc and he stated to call the patient and find out if she was meaning her reflux medication that he stated to take twice daily. He states if she needs a refill on her potassium medication we can send in a 30 day refill for once daily of it but she would need to follow up with her primary for any further refills. Will give the patient a call.

## 2019-02-27 NOTE — Telephone Encounter (Signed)
Attempted to reach patient but was unsuccessful. Unable to leave a message due to vm box being full.

## 2019-03-06 DIAGNOSIS — Z794 Long term (current) use of insulin: Secondary | ICD-10-CM | POA: Diagnosis not present

## 2019-03-06 DIAGNOSIS — E1165 Type 2 diabetes mellitus with hyperglycemia: Secondary | ICD-10-CM | POA: Diagnosis not present

## 2019-03-06 DIAGNOSIS — E11622 Type 2 diabetes mellitus with other skin ulcer: Secondary | ICD-10-CM | POA: Diagnosis not present

## 2019-03-06 DIAGNOSIS — L89152 Pressure ulcer of sacral region, stage 2: Secondary | ICD-10-CM | POA: Diagnosis not present

## 2019-03-10 DIAGNOSIS — Z0000 Encounter for general adult medical examination without abnormal findings: Secondary | ICD-10-CM | POA: Diagnosis not present

## 2019-03-10 DIAGNOSIS — Z1389 Encounter for screening for other disorder: Secondary | ICD-10-CM | POA: Diagnosis not present

## 2019-03-10 DIAGNOSIS — E1165 Type 2 diabetes mellitus with hyperglycemia: Secondary | ICD-10-CM | POA: Diagnosis not present

## 2019-03-10 DIAGNOSIS — I10 Essential (primary) hypertension: Secondary | ICD-10-CM | POA: Diagnosis not present

## 2019-03-10 DIAGNOSIS — G40909 Epilepsy, unspecified, not intractable, without status epilepticus: Secondary | ICD-10-CM | POA: Diagnosis not present

## 2019-03-10 DIAGNOSIS — I2510 Atherosclerotic heart disease of native coronary artery without angina pectoris: Secondary | ICD-10-CM | POA: Diagnosis not present

## 2019-03-10 DIAGNOSIS — Z23 Encounter for immunization: Secondary | ICD-10-CM | POA: Diagnosis not present

## 2019-03-10 DIAGNOSIS — J4530 Mild persistent asthma, uncomplicated: Secondary | ICD-10-CM | POA: Diagnosis not present

## 2019-03-10 DIAGNOSIS — E7800 Pure hypercholesterolemia, unspecified: Secondary | ICD-10-CM | POA: Diagnosis not present

## 2019-03-10 DIAGNOSIS — J302 Other seasonal allergic rhinitis: Secondary | ICD-10-CM | POA: Diagnosis not present

## 2019-03-13 DIAGNOSIS — E1165 Type 2 diabetes mellitus with hyperglycemia: Secondary | ICD-10-CM | POA: Diagnosis not present

## 2019-03-13 DIAGNOSIS — E669 Obesity, unspecified: Secondary | ICD-10-CM | POA: Diagnosis not present

## 2019-03-13 DIAGNOSIS — Z794 Long term (current) use of insulin: Secondary | ICD-10-CM | POA: Diagnosis not present

## 2019-03-13 DIAGNOSIS — L89152 Pressure ulcer of sacral region, stage 2: Secondary | ICD-10-CM | POA: Diagnosis not present

## 2019-03-13 DIAGNOSIS — L89312 Pressure ulcer of right buttock, stage 2: Secondary | ICD-10-CM | POA: Diagnosis not present

## 2019-03-13 DIAGNOSIS — Z6838 Body mass index (BMI) 38.0-38.9, adult: Secondary | ICD-10-CM | POA: Diagnosis not present

## 2019-03-27 DIAGNOSIS — E1165 Type 2 diabetes mellitus with hyperglycemia: Secondary | ICD-10-CM | POA: Diagnosis not present

## 2019-03-27 DIAGNOSIS — Z6837 Body mass index (BMI) 37.0-37.9, adult: Secondary | ICD-10-CM | POA: Diagnosis not present

## 2019-03-27 DIAGNOSIS — L89152 Pressure ulcer of sacral region, stage 2: Secondary | ICD-10-CM | POA: Diagnosis not present

## 2019-03-27 DIAGNOSIS — Z6838 Body mass index (BMI) 38.0-38.9, adult: Secondary | ICD-10-CM | POA: Diagnosis not present

## 2019-03-27 DIAGNOSIS — Z794 Long term (current) use of insulin: Secondary | ICD-10-CM | POA: Diagnosis not present

## 2019-03-31 DIAGNOSIS — Z1231 Encounter for screening mammogram for malignant neoplasm of breast: Secondary | ICD-10-CM | POA: Diagnosis not present

## 2019-04-12 DIAGNOSIS — J0100 Acute maxillary sinusitis, unspecified: Secondary | ICD-10-CM | POA: Diagnosis not present

## 2019-05-04 DIAGNOSIS — R1013 Epigastric pain: Secondary | ICD-10-CM | POA: Diagnosis not present

## 2019-05-10 DIAGNOSIS — K760 Fatty (change of) liver, not elsewhere classified: Secondary | ICD-10-CM | POA: Diagnosis not present

## 2019-05-10 DIAGNOSIS — R1013 Epigastric pain: Secondary | ICD-10-CM | POA: Diagnosis not present

## 2019-05-10 DIAGNOSIS — J0101 Acute recurrent maxillary sinusitis: Secondary | ICD-10-CM | POA: Diagnosis not present

## 2019-05-15 ENCOUNTER — Ambulatory Visit: Payer: Medicare HMO | Admitting: Allergy and Immunology

## 2019-05-15 DIAGNOSIS — K253 Acute gastric ulcer without hemorrhage or perforation: Secondary | ICD-10-CM | POA: Diagnosis not present

## 2019-05-15 DIAGNOSIS — K254 Chronic or unspecified gastric ulcer with hemorrhage: Secondary | ICD-10-CM | POA: Diagnosis not present

## 2019-05-15 DIAGNOSIS — K2901 Acute gastritis with bleeding: Secondary | ICD-10-CM | POA: Diagnosis not present

## 2019-05-15 DIAGNOSIS — R1013 Epigastric pain: Secondary | ICD-10-CM | POA: Diagnosis not present

## 2019-05-24 ENCOUNTER — Encounter: Payer: Self-pay | Admitting: Allergy and Immunology

## 2019-05-24 ENCOUNTER — Ambulatory Visit (INDEPENDENT_AMBULATORY_CARE_PROVIDER_SITE_OTHER): Payer: Medicare HMO | Admitting: Allergy and Immunology

## 2019-05-24 ENCOUNTER — Other Ambulatory Visit: Payer: Self-pay

## 2019-05-24 VITALS — BP 126/86 | HR 69 | Temp 98.2°F | Resp 18

## 2019-05-24 DIAGNOSIS — J324 Chronic pansinusitis: Secondary | ICD-10-CM | POA: Diagnosis not present

## 2019-05-24 DIAGNOSIS — J3089 Other allergic rhinitis: Secondary | ICD-10-CM

## 2019-05-24 DIAGNOSIS — B999 Unspecified infectious disease: Secondary | ICD-10-CM

## 2019-05-24 DIAGNOSIS — K219 Gastro-esophageal reflux disease without esophagitis: Secondary | ICD-10-CM

## 2019-05-24 MED ORDER — CEFUROXIME AXETIL 250 MG PO TABS: ORAL_TABLET | ORAL | 0 refills | Status: DC

## 2019-05-24 NOTE — Progress Notes (Signed)
[name]   Follow-up Note  Referring Provider: Townsend Roger, MD Primary Provider: Nona Dell, Corene Cornea, MD Date of Office Visit: 05/24/2019  Subjective:   Alicia Kelly (DOB: [birth year]) is a 64 y.o. female who returns to the Allergy and [place] on 05/24/2019 in re-evaluation of the following:  HPI: Alicia Kelly returns to this clinic in evaluation of her allergic rhinitis and history of chronic sinusitis and reflux induced respiratory disease.  Her last visit to this clinic was 14 November 2018.  Overall she has done very well until the past 2 weeks.  At that point in time she developed a "sinus infection".  Her sinus infection is manifested as significant nasal congestion and pain across her nasal bridge and around her eyes and face and spitting yellow phlegm.  When she washes out her nose with a saline solution she sees yellow nasal discharge.  Prior to this point in time she was doing very well while consistently using her nasal steroid and using montelukast.  It does not sound as though she required a antibiotic or systemic steroid to treat an airway issue since her visit.  She believes that her reflux is under excellent control at this point in time on her current therapy.  She is not sure if she did receive the Pneumovax vaccination.  She did receive the flu vaccine.  Allergies as of 05/24/2019      Reactions   Levofloxacin Other (See Comments)   Throat swelling   Lisinopril Hives   Nitrofurantoin Swelling   Throat swelling   Penicillins Anaphylaxis   Clarithromycin Other (See Comments)   seizures   Sulfamethoxazole-trimethoprim    Causes seizures      Medication List      amLODipine 10 MG tablet Commonly known as: NORVASC   atorvastatin 40 MG tablet Commonly known as: LIPITOR Take 40 mg by mouth daily.   cetirizine 10 MG tablet Commonly known as: ZYRTEC Take 10 mg by mouth daily as needed for allergies.     fluticasone 50 MCG/ACT nasal spray Commonly known as: FLONASE USE 1 SPRAY IN EACH NOSTRIL TWICE DAILY AS DIRECTED   glipiZIDE 5 MG tablet Commonly known as: GLUCOTROL Take 5 mg by mouth daily before breakfast.   levETIRAcetam 500 MG tablet Commonly known as: KEPPRA Take 1,000 mg by mouth 3 (three) times daily.   losartan-hydrochlorothiazide 100-25 MG tablet Commonly known as: HYZAAR Take 1 tablet by mouth daily.   montelukast 10 MG tablet Commonly known as: SINGULAIR TAKE ONE TABLET BY MOUTH DAILY AS DIRECTED   omeprazole 40 MG capsule Commonly known as: PRILOSEC Take 40 mg by mouth 2 (two) times daily.   potassium chloride 10 MEQ CR capsule Commonly known as: MICRO-K Take 10 mEq by mouth daily.   Vimpat 200 MG Tabs tablet Generic drug: lacosamide Take 600 mg by mouth 2 (two) times daily.       Past Medical History:  Diagnosis Date  . Asthma   . Diabetes ([place])   . Epilepsy ([place])   . Hypertension   . Sleep apnea     Past Surgical History:  Procedure Laterality Date  . APPENDECTOMY    . TONSILLECTOMY      Review of systems negative except as noted in HPI / PMHx or noted below:  Review of Systems  Constitutional: Negative.   HENT: Negative.   Eyes: Negative.   Respiratory: Negative.   Cardiovascular: Negative.   Gastrointestinal: Negative.  Genitourinary: Negative.   Musculoskeletal: Negative.   Skin: Negative.   Neurological: Negative.   Endo/Heme/Allergies: Negative.   Psychiatric/Behavioral: Negative.      Objective:   Vitals:   05/24/19 0958  BP: 126/86  Pulse: 69  Resp: 18  Temp: 98.2 F (36.8 C)  SpO2: 96%          Physical Exam Constitutional:      Appearance: She is not diaphoretic.  HENT:     Head: Normocephalic.     Right Ear: Tympanic membrane, ear canal and external ear normal.     Left Ear: Tympanic membrane, ear canal and external ear normal.     Nose: Nose normal. No mucosal edema or rhinorrhea.     Mouth/Throat:      Pharynx: Uvula midline. No oropharyngeal exudate.  Eyes:     Conjunctiva/sclera: Conjunctivae normal.  Neck:     Thyroid: No thyromegaly.     Trachea: Trachea normal. No tracheal tenderness or tracheal deviation.  Cardiovascular:     Rate and Rhythm: Normal rate and regular rhythm.     Heart sounds: Normal heart sounds, S1 normal and S2 normal. No murmur.  Pulmonary:     Effort: No respiratory distress.     Breath sounds: Normal breath sounds. No stridor. No wheezing or rales.  Lymphadenopathy:     Head:     Right side of head: No tonsillar adenopathy.     Left side of head: No tonsillar adenopathy.     Cervical: No cervical adenopathy.  Skin:    Findings: No erythema or rash.     Nails: There is no clubbing.  Neurological:     Mental Status: She is alert.     Diagnostics: none  Assessment and Plan:   1. Other allergic rhinitis   2. Chronic pansinusitis   3. LPRD (laryngopharyngeal reflux disease)   4. Recurrent infections     1. Continue to Treat and prevent inflammation:   A. Flonase one spray each nostril twice a day  B. montelukast 10 mg tablet 1 time per day  2. Continue to Treat and prevent reflux:   A. Omeprazole 40mg  TWO times per day  3. Treat infection:   A. Ceftin 250 - 1 tablet 2 times per day for 10 days  4. If needed:   A. cetirizine 10 mg tablet 1 time per day  B. Nasal saline spray / wash  5.  Confirm that Pneumovax vaccination was completed and if so then repeat Pneumo 23 blood test 6 weeks after vaccination  6.  Return to clinic in 6 months or earlier if problem  7. Obtain Covid vaccine  We will start Aleidy on a broad-spectrum antibiotic to address what appears to be a acute sinus infection on the background of chronic sinusitis that has already been treated with FESS.  She will continue with anti-inflammatory agents for her airway and therapy directed against reflux.  She did have relatively low levels of antibodies directed against  pneumococcus during her previous evaluation and we have been trying to get her to obtain a Pneumovax so that we can check to see if she has a good response to this vaccination.  We will once again work through this issue.  I will see her back in this clinic in 6 months or earlier if there is a problem.  Allena Katz, MD Allergy / Immunology [place]

## 2019-05-24 NOTE — Patient Instructions (Addendum)
  1. Continue to Treat and prevent inflammation:   A. Flonase one spray each nostril twice a day  B. montelukast 10 mg tablet 1 time per day  2. Continue to Treat and prevent reflux:   A. Omeprazole 40mg  TWO times per day  3. Treat infection:   A. Ceftin 250 - 1 tablet 2 times per day for 10 days  4. If needed:   A. cetirizine 10 mg tablet 1 time per day  B. Nasal saline spray / wash  5.  Confirm that Pneumovax vaccination was completed and if so then repeat Pneumo 23 blood test 6 weeks after vaccination  6.  Return to clinic in 6 months or earlier if problem  7. Obtain Covid vaccine

## 2019-05-25 ENCOUNTER — Encounter: Payer: Self-pay | Admitting: Allergy and Immunology

## 2019-05-25 DIAGNOSIS — K259 Gastric ulcer, unspecified as acute or chronic, without hemorrhage or perforation: Secondary | ICD-10-CM | POA: Diagnosis not present

## 2019-06-27 DIAGNOSIS — E876 Hypokalemia: Secondary | ICD-10-CM | POA: Diagnosis not present

## 2019-06-27 DIAGNOSIS — R1013 Epigastric pain: Secondary | ICD-10-CM | POA: Diagnosis not present

## 2019-06-27 DIAGNOSIS — K2901 Acute gastritis with bleeding: Secondary | ICD-10-CM | POA: Diagnosis not present

## 2019-06-27 DIAGNOSIS — Z800 Family history of malignant neoplasm of digestive organs: Secondary | ICD-10-CM | POA: Diagnosis not present

## 2019-07-12 DIAGNOSIS — G40309 Generalized idiopathic epilepsy and epileptic syndromes, not intractable, without status epilepticus: Secondary | ICD-10-CM | POA: Diagnosis not present

## 2019-07-12 DIAGNOSIS — G40209 Localization-related (focal) (partial) symptomatic epilepsy and epileptic syndromes with complex partial seizures, not intractable, without status epilepticus: Secondary | ICD-10-CM | POA: Diagnosis not present

## 2019-07-20 DIAGNOSIS — E876 Hypokalemia: Secondary | ICD-10-CM | POA: Diagnosis not present

## 2019-07-26 DIAGNOSIS — R1013 Epigastric pain: Secondary | ICD-10-CM | POA: Diagnosis not present

## 2019-07-26 DIAGNOSIS — K635 Polyp of colon: Secondary | ICD-10-CM | POA: Diagnosis not present

## 2019-07-26 DIAGNOSIS — Z1211 Encounter for screening for malignant neoplasm of colon: Secondary | ICD-10-CM | POA: Diagnosis not present

## 2019-07-26 DIAGNOSIS — D124 Benign neoplasm of descending colon: Secondary | ICD-10-CM | POA: Diagnosis not present

## 2019-07-26 DIAGNOSIS — K2960 Other gastritis without bleeding: Secondary | ICD-10-CM | POA: Diagnosis not present

## 2019-07-26 DIAGNOSIS — Z800 Family history of malignant neoplasm of digestive organs: Secondary | ICD-10-CM | POA: Diagnosis not present

## 2019-07-26 LAB — HM COLONOSCOPY

## 2019-08-14 DIAGNOSIS — Z6841 Body Mass Index (BMI) 40.0 and over, adult: Secondary | ICD-10-CM | POA: Diagnosis not present

## 2019-08-14 DIAGNOSIS — J329 Chronic sinusitis, unspecified: Secondary | ICD-10-CM | POA: Diagnosis not present

## 2019-08-14 DIAGNOSIS — B372 Candidiasis of skin and nail: Secondary | ICD-10-CM | POA: Diagnosis not present

## 2019-08-14 DIAGNOSIS — Z1152 Encounter for screening for COVID-19: Secondary | ICD-10-CM | POA: Diagnosis not present

## 2019-08-14 DIAGNOSIS — J309 Allergic rhinitis, unspecified: Secondary | ICD-10-CM | POA: Diagnosis not present

## 2019-08-14 DIAGNOSIS — E119 Type 2 diabetes mellitus without complications: Secondary | ICD-10-CM | POA: Diagnosis not present

## 2019-08-16 ENCOUNTER — Telehealth: Payer: Self-pay | Admitting: Unknown Physician Specialty

## 2019-08-16 ENCOUNTER — Other Ambulatory Visit: Payer: Self-pay | Admitting: Unknown Physician Specialty

## 2019-08-16 DIAGNOSIS — E119 Type 2 diabetes mellitus without complications: Secondary | ICD-10-CM

## 2019-08-16 DIAGNOSIS — I10 Essential (primary) hypertension: Secondary | ICD-10-CM

## 2019-08-16 DIAGNOSIS — U071 COVID-19: Secondary | ICD-10-CM

## 2019-08-16 NOTE — Telephone Encounter (Signed)
  I connected by phone with Renard Matter on 08/16/2019 at 5:01 PM to discuss the potential use of an new treatment for mild to moderate COVID-19 viral infection in non-hospitalized patients.  This patient is a 64 y.o. female that meets the FDA criteria for Emergency Use Authorization of bamlanivimab or casirivimab\imdevimab.  Has a (+) direct SARS-CoV-2 viral test result  Has mild or moderate COVID-19   Is ? 63 years of age and weighs ? 40 kg  Is NOT hospitalized due to COVID-19  Is NOT requiring oxygen therapy or requiring an increase in baseline oxygen flow rate due to COVID-19  Is within 10 days of symptom onset  Has at least one of the high risk factor(s) for progression to severe COVID-19 and/or hospitalization as defined in EUA.  Specific high risk criteria : Diabetes and other co-morbid conditions   I have spoken and communicated the following to the patient or parent/caregiver:  1. FDA has authorized the emergency use of bamlanivimab and casirivimab\imdevimab for the treatment of mild to moderate COVID-19 in adults and pediatric patients with positive results of direct SARS-CoV-2 viral testing who are 66 years of age and older weighing at least 40 kg, and who are at high risk for progressing to severe COVID-19 and/or hospitalization.  2. The significant known and potential risks and benefits of bamlanivimab and casirivimab\imdevimab, and the extent to which such potential risks and benefits are unknown.  3. Information on available alternative treatments and the risks and benefits of those alternatives, including clinical trials.  4. Patients treated with bamlanivimab and casirivimab\imdevimab should continue to self-isolate and use infection control measures (e.g., wear mask, isolate, social distance, avoid sharing personal items, clean and disinfect "high touch" surfaces, and frequent handwashing) according to CDC guidelines.   5. The patient or parent/caregiver has the  option to accept or refuse bamlanivimab or casirivimab\imdevimab .  After reviewing this information with the patient, The patient agreed to proceed with receiving the bamlanimivab infusion and will be provided a copy of the Fact sheet prior to receiving the infusion.Kathrine Haddock 08/16/2019 5:01 PM

## 2019-08-17 ENCOUNTER — Ambulatory Visit (HOSPITAL_COMMUNITY): Payer: Medicare HMO

## 2019-08-17 ENCOUNTER — Encounter (HOSPITAL_COMMUNITY): Payer: Self-pay

## 2019-08-17 MED ORDER — SODIUM CHLORIDE 0.9 % IV SOLN
700.0000 mg | Freq: Once | INTRAVENOUS | Status: DC
  Filled 2019-08-17: qty 20

## 2019-08-18 ENCOUNTER — Ambulatory Visit (HOSPITAL_COMMUNITY)
Admission: RE | Admit: 2019-08-18 | Discharge: 2019-08-18 | Disposition: A | Discharge status: Home / Self Care | Payer: Medicare HMO | Source: Ambulatory Visit | Attending: Pulmonary Disease | Admitting: Pulmonary Disease

## 2019-08-18 DIAGNOSIS — E119 Type 2 diabetes mellitus without complications: Secondary | ICD-10-CM

## 2019-08-18 DIAGNOSIS — I10 Essential (primary) hypertension: Secondary | ICD-10-CM

## 2019-08-18 DIAGNOSIS — U071 COVID-19: Secondary | ICD-10-CM

## 2019-09-06 DIAGNOSIS — J019 Acute sinusitis, unspecified: Secondary | ICD-10-CM | POA: Diagnosis not present

## 2019-09-25 DIAGNOSIS — N3020 Other chronic cystitis without hematuria: Secondary | ICD-10-CM | POA: Diagnosis not present

## 2019-09-25 DIAGNOSIS — R300 Dysuria: Secondary | ICD-10-CM | POA: Diagnosis not present

## 2019-10-24 DIAGNOSIS — K2960 Other gastritis without bleeding: Secondary | ICD-10-CM | POA: Diagnosis not present

## 2019-10-24 DIAGNOSIS — K219 Gastro-esophageal reflux disease without esophagitis: Secondary | ICD-10-CM | POA: Diagnosis not present

## 2019-10-24 DIAGNOSIS — Z8711 Personal history of peptic ulcer disease: Secondary | ICD-10-CM | POA: Diagnosis not present

## 2019-11-02 DIAGNOSIS — E785 Hyperlipidemia, unspecified: Secondary | ICD-10-CM | POA: Diagnosis not present

## 2019-11-02 DIAGNOSIS — E1165 Type 2 diabetes mellitus with hyperglycemia: Secondary | ICD-10-CM | POA: Diagnosis not present

## 2019-11-02 DIAGNOSIS — J309 Allergic rhinitis, unspecified: Secondary | ICD-10-CM | POA: Diagnosis not present

## 2019-11-02 DIAGNOSIS — I10 Essential (primary) hypertension: Secondary | ICD-10-CM | POA: Diagnosis not present

## 2019-11-02 DIAGNOSIS — G4089 Other seizures: Secondary | ICD-10-CM | POA: Diagnosis not present

## 2019-11-06 DIAGNOSIS — T07XXXA Unspecified multiple injuries, initial encounter: Secondary | ICD-10-CM | POA: Diagnosis not present

## 2019-11-06 DIAGNOSIS — S76311A Strain of muscle, fascia and tendon of the posterior muscle group at thigh level, right thigh, initial encounter: Secondary | ICD-10-CM | POA: Diagnosis not present

## 2019-11-06 DIAGNOSIS — R52 Pain, unspecified: Secondary | ICD-10-CM | POA: Diagnosis not present

## 2019-11-06 DIAGNOSIS — M1611 Unilateral primary osteoarthritis, right hip: Secondary | ICD-10-CM | POA: Diagnosis not present

## 2019-11-06 DIAGNOSIS — M76891 Other specified enthesopathies of right lower limb, excluding foot: Secondary | ICD-10-CM | POA: Diagnosis not present

## 2019-11-06 DIAGNOSIS — M79604 Pain in right leg: Secondary | ICD-10-CM | POA: Diagnosis not present

## 2019-11-06 DIAGNOSIS — M25551 Pain in right hip: Secondary | ICD-10-CM | POA: Diagnosis not present

## 2019-11-16 DIAGNOSIS — M5136 Other intervertebral disc degeneration, lumbar region: Secondary | ICD-10-CM | POA: Diagnosis not present

## 2019-11-16 DIAGNOSIS — M1611 Unilateral primary osteoarthritis, right hip: Secondary | ICD-10-CM | POA: Diagnosis not present

## 2019-11-16 DIAGNOSIS — M7061 Trochanteric bursitis, right hip: Secondary | ICD-10-CM | POA: Diagnosis not present

## 2019-12-01 DIAGNOSIS — I10 Essential (primary) hypertension: Secondary | ICD-10-CM | POA: Diagnosis not present

## 2019-12-01 DIAGNOSIS — E119 Type 2 diabetes mellitus without complications: Secondary | ICD-10-CM | POA: Diagnosis not present

## 2019-12-01 DIAGNOSIS — E785 Hyperlipidemia, unspecified: Secondary | ICD-10-CM | POA: Diagnosis not present

## 2019-12-01 DIAGNOSIS — J309 Allergic rhinitis, unspecified: Secondary | ICD-10-CM | POA: Diagnosis not present

## 2019-12-01 DIAGNOSIS — G4089 Other seizures: Secondary | ICD-10-CM | POA: Diagnosis not present

## 2019-12-01 DIAGNOSIS — N390 Urinary tract infection, site not specified: Secondary | ICD-10-CM | POA: Diagnosis not present

## 2019-12-06 DIAGNOSIS — J4520 Mild intermittent asthma, uncomplicated: Secondary | ICD-10-CM | POA: Diagnosis not present

## 2019-12-06 DIAGNOSIS — R5383 Other fatigue: Secondary | ICD-10-CM | POA: Diagnosis not present

## 2019-12-06 DIAGNOSIS — G4733 Obstructive sleep apnea (adult) (pediatric): Secondary | ICD-10-CM | POA: Diagnosis not present

## 2019-12-07 DIAGNOSIS — L304 Erythema intertrigo: Secondary | ICD-10-CM | POA: Diagnosis not present

## 2019-12-16 DIAGNOSIS — B372 Candidiasis of skin and nail: Secondary | ICD-10-CM | POA: Diagnosis not present

## 2019-12-20 DIAGNOSIS — L719 Rosacea, unspecified: Secondary | ICD-10-CM | POA: Diagnosis not present

## 2019-12-28 DIAGNOSIS — H9312 Tinnitus, left ear: Secondary | ICD-10-CM | POA: Diagnosis not present

## 2019-12-28 DIAGNOSIS — R6884 Jaw pain: Secondary | ICD-10-CM | POA: Diagnosis not present

## 2019-12-28 DIAGNOSIS — B372 Candidiasis of skin and nail: Secondary | ICD-10-CM | POA: Diagnosis not present

## 2019-12-28 DIAGNOSIS — E119 Type 2 diabetes mellitus without complications: Secondary | ICD-10-CM | POA: Diagnosis not present

## 2020-01-01 DIAGNOSIS — B372 Candidiasis of skin and nail: Secondary | ICD-10-CM | POA: Diagnosis not present

## 2020-01-01 DIAGNOSIS — H9312 Tinnitus, left ear: Secondary | ICD-10-CM | POA: Diagnosis not present

## 2020-01-01 DIAGNOSIS — R6884 Jaw pain: Secondary | ICD-10-CM | POA: Diagnosis not present

## 2020-01-04 DIAGNOSIS — B373 Candidiasis of vulva and vagina: Secondary | ICD-10-CM | POA: Diagnosis not present

## 2020-01-16 DIAGNOSIS — Z1152 Encounter for screening for COVID-19: Secondary | ICD-10-CM | POA: Diagnosis not present

## 2020-01-16 DIAGNOSIS — R197 Diarrhea, unspecified: Secondary | ICD-10-CM | POA: Diagnosis not present

## 2020-04-09 ENCOUNTER — Ambulatory Visit: Payer: Medicare HMO | Admitting: Legal Medicine

## 2020-04-09 DIAGNOSIS — G40209 Localization-related (focal) (partial) symptomatic epilepsy and epileptic syndromes with complex partial seizures, not intractable, without status epilepticus: Secondary | ICD-10-CM | POA: Insufficient documentation

## 2020-07-03 ENCOUNTER — Ambulatory Visit (INDEPENDENT_AMBULATORY_CARE_PROVIDER_SITE_OTHER): Payer: Medicare HMO | Admitting: Legal Medicine

## 2020-07-03 ENCOUNTER — Encounter: Payer: Self-pay | Admitting: Legal Medicine

## 2020-07-03 ENCOUNTER — Other Ambulatory Visit: Payer: Self-pay

## 2020-07-03 VITALS — BP 150/90 | HR 73 | Temp 97.5°F | Resp 16 | Ht 65.0 in | Wt 244.8 lb

## 2020-07-03 DIAGNOSIS — E669 Obesity, unspecified: Secondary | ICD-10-CM

## 2020-07-03 DIAGNOSIS — G40209 Localization-related (focal) (partial) symptomatic epilepsy and epileptic syndromes with complex partial seizures, not intractable, without status epilepticus: Secondary | ICD-10-CM

## 2020-07-03 DIAGNOSIS — I10 Essential (primary) hypertension: Secondary | ICD-10-CM

## 2020-07-03 DIAGNOSIS — E782 Mixed hyperlipidemia: Secondary | ICD-10-CM | POA: Diagnosis not present

## 2020-07-03 DIAGNOSIS — E1159 Type 2 diabetes mellitus with other circulatory complications: Secondary | ICD-10-CM

## 2020-07-03 DIAGNOSIS — L89302 Pressure ulcer of unspecified buttock, stage 2: Secondary | ICD-10-CM

## 2020-07-03 DIAGNOSIS — E119 Type 2 diabetes mellitus without complications: Secondary | ICD-10-CM

## 2020-07-03 DIAGNOSIS — L89102 Pressure ulcer of unspecified part of back, stage 2: Secondary | ICD-10-CM

## 2020-07-03 DIAGNOSIS — E1169 Type 2 diabetes mellitus with other specified complication: Secondary | ICD-10-CM

## 2020-07-03 DIAGNOSIS — I152 Hypertension secondary to endocrine disorders: Secondary | ICD-10-CM

## 2020-07-03 DIAGNOSIS — G4733 Obstructive sleep apnea (adult) (pediatric): Secondary | ICD-10-CM

## 2020-07-03 LAB — POCT UA - MICROALBUMIN: Microalbumin Ur, POC: 10 mg/L

## 2020-07-03 NOTE — Progress Notes (Signed)
New Patient Office Visit  Subjective:  Patient ID: Alicia Kelly, female    DOB: 06-Jul-1955  Age: 65 y.o. MRN: [id number]  CC:  Chief Complaint  Patient presents with  . New Patient (Initial Visit)  . wound    Patient states she has a wound on L buttocks that she thinks the toilet seat caused. States it is open and she has been to the wound center in [facility] and states it worsened after she was seen.   . Diabetes    HPI Alicia Kelly presents for wound on buttock, she is diabetic. Infection started 2 to 3 weeks ago , it is painful, no drainage. She had been going to [facility] wound center and it has not improved over last 6 months  Diabetes for 6 to 8 years.  She is on tresiba 26 units at 2:30.gliizide. also.    Patient presents for follow up of hypertension.  Patient tolerating losartan/HCTZ well with side effects.  Patient was diagnosed with hypertension 2010 so has been treated for hypertension for 10 years.Patient is working on maintaining diet and exercise regimen and follows up as directed. Complication include none.  Patient presents with hyperlipidemia.  Compliance with treatment has been good; patient takes medicines as directed, maintains low cholesterol diet, follows up as directed, and maintains exercise regimen.  Patient is using atorvastatin without problems.    Past Medical History:  Diagnosis Date  . Asthma   . Calculus of gallbladder with chronic cholecystitis without obstruction 03/22/2017  . Diabetes ([place])   . Epilepsy ([place])   . Hypertension   . Sleep apnea     Past Surgical History:  Procedure Laterality Date  . APPENDECTOMY    . TONSILLECTOMY    . TONSILLECTOMY      Family History  Problem Relation Age of Onset  . Colon cancer Mother   . Hypertension Father   . Diabetes Father   . Allergic rhinitis Sister   . Hypertension Sister   . Cancer Maternal Uncle   . Cancer Maternal Grandmother   . Cancer Paternal Grandmother   . Diabetes Paternal  Grandmother     Social History   Socioeconomic History  . Marital status: Married    Spouse name: Not on file  . Number of children: Not on file  . Years of education: Not on file  . Highest education level: Not on file  Occupational History  . Not on file  Tobacco Use  . Smoking status: Never Smoker  . Smokeless tobacco: Never Used  Vaping Use  . Vaping Use: Never used  Substance and Sexual Activity  . Alcohol use: Not Currently  . Drug use: Never  . Sexual activity: Not on file  Other Topics Concern  . Not on file  Social History Narrative  . Not on file   Social Determinants of Health   Financial Resource Strain: Not on file  Food Insecurity: Not on file  Transportation Needs: Not on file  Physical Activity: Not on file  Stress: Not on file  Social Connections: Not on file  Intimate Partner Violence: Not on file    ROS Review of Systems  Constitutional: Negative for activity change and fatigue.  HENT: Negative for sinus pressure and sinus pain.   Eyes: Negative for visual disturbance.  Respiratory: Negative for apnea and shortness of breath.   Cardiovascular: Negative for chest pain, palpitations and leg swelling.  Gastrointestinal: Negative for abdominal distention and abdominal pain.  Endocrine: Negative for polyuria.  Genitourinary: Negative for difficulty urinating, dysuria and urgency.  Musculoskeletal: Negative for arthralgias and back pain.  Skin: Negative.   Neurological: Negative.   Psychiatric/Behavioral: Negative.     Objective:   Today's Vitals: BP (!) 150/90 (BP Location: Right Arm, Patient Position: Sitting, Cuff Size: Normal)   Pulse 73   Temp (!) 97.5 F (36.4 C) (Temporal)   Resp 16   Ht 5\' 5"  (1.651 m)   Wt 244 lb 12.8 oz (111 kg)   SpO2 98%   BMI 40.74 kg/m   Physical Exam Vitals reviewed.  Constitutional:      Appearance: Normal appearance. She is normal weight.  HENT:     Right Ear: Tympanic membrane normal.     Left  Ear: Tympanic membrane normal.  Eyes:     Extraocular Movements: Extraocular movements intact.     Conjunctiva/sclera: Conjunctivae normal.     Pupils: Pupils are equal, round, and reactive to light.  Cardiovascular:     Rate and Rhythm: Normal rate and regular rhythm.     Pulses: Normal pulses.     Heart sounds: Normal heart sounds. No murmur heard. No gallop.   Pulmonary:     Effort: Pulmonary effort is normal. No respiratory distress.     Breath sounds: Normal breath sounds. No rales.  Abdominal:     General: Abdomen is flat. Bowel sounds are normal. There is no distension.     Palpations: Abdomen is soft.     Tenderness: There is no abdominal tenderness.  Musculoskeletal:        General: Normal range of motion.     Cervical back: Normal range of motion and neck supple.  Skin:    Capillary Refill: Capillary refill takes less than 2 seconds.     Findings: Erythema present.          Comments: She has been going to wound center in [place]. She had been going to wound center for one year   #2 stage 2 ulcers one 2cm and clean and one 1cm.  There is diffuse redness in guteal fold with   Neurological:     General: No focal deficit present.     Mental Status: She is alert and oriented to person, place, and time. Mental status is at baseline.     Assessment & Plan:  Diagnoses and all orders for this visit: Type 2 diabetes mellitus without complication, without long-term current use of insulin (HCC) -     POCT UA - Microalbumin -     Hemoglobin A1c An individual care plan for diabetes was established and reinforced today.  The patient's status was assessed using clinical findings on exam, labs and diagnostic testing. Patient success at meeting goals based on disease specific evidence-based guidelines and found to be fair controlled. Medications were assessed and patient's understanding of the medical issues , including barriers were assessed. Recommend adherence to a diabetic diet,  a graduated exercise program, HgbA1c level is checked quarterly, and urine microalbumin performed yearly .  Annual mono-filament sensation testing performed. Lower blood pressure and control hyperlipidemia is important. Get annual eye exams and annual flu shots and smoking cessation discussed.  Self management goals were discussed.  Partial symptomatic epilepsy with complex partial  seizures, not intractable, without status epilepticus ([place]) Patient has chronic epilepsy and is under good control with medicines  Obesity, diabetes, and hypertension syndrome ([place]) -     CBC with Differential/Platelet -     Comprehensive metabolic panel An individual  care plan for diabetes was established and reinforced today.  The patient's status was assessed using clinical findings on exam, labs and diagnostic testing. Patient success at meeting goals based on disease specific evidence-based guidelines and found to be fair controlled. Medications were assessed and patient's understanding of the medical issues , including barriers were assessed. Recommend adherence to a diabetic diet, a graduated exercise program, HgbA1c level is checked quarterly, and urine microalbumin performed yearly .  Annual mono-filament sensation testing performed. Lower blood pressure and control hyperlipidemia is important. Get annual eye exams and annual flu shots and smoking cessation discussed.  Self management goals were discussed.  Mixed hyperlipidemia -     Lipid panel AN INDIVIDUAL CARE PLAN for hyperlipidemia/ cholesterol was established and reinforced today.  The patient's status was assessed using clinical findings on exam, lab and other diagnostic tests. The patient's disease status was assessed based on evidence-based guidelines and found to be fair controlled. MEDICATIONS were reviewed. SELF MANAGEMENT GOALS have been discussed and patient's success at attaining the goal of low cholesterol was assessed. RECOMMENDATION given  include regular exercise 3 days a week and low cholesterol/low fat diet. CLINICAL SUMMARY including written plan to identify barriers unique to the patient due to social or economic  reasons was discussed.  Pressure injury of buttock, stage 2, unspecified laterality ([place]) -     Ambulatory referral to [place] -     AMB Referral to [place] Patient has 2 pressure ulcer left buttock and confluent red rash in gluteal fold, the ulcers redressed, home health nurse called  Essential hypertension An individual hypertension care plan was established and reinforced today.  The patient's status was assessed using clinical findings on exam and labs or diagnostic tests. The patient's success at meeting treatment goals on disease specific evidence-based guidelines and found to be fair controlled. SELF MANAGEMENT: The patient and I together assessed ways to personally work towards obtaining the recommended goals. RECOMMENDATIONS: avoid decongestants found in common cold remedies, decrease consumption of alcohol, perform routine monitoring of BP with home BP cuff, exercise, reduction of dietary salt, take medicines as prescribed, try not to miss doses and quit smoking.  Regular exercise and maintaining a healthy weight is needed.  Stress reduction may help. A CLINICAL SUMMARY including written plan identify barriers to care unique to individual due to social or financial issues.  We attempt to mutually creat solutions for individual and family understanding. Obstructive sleep apnea syndrome Patient has positive sleep study but is unable to follow up for titration now due to her new ulcers on buttocks     Outpatient Encounter Medications as of 07/03/2020  Medication Sig  . Accu-Chek Softclix Lancets lancets   . atorvastatin (LIPITOR) 40 MG tablet Take 40 mg by mouth daily.   . Blood Glucose Calibration (ACCU-CHEK AVIVA) SOLN   . glipiZIDE (GLUCOTROL) 5 MG tablet Take 5 mg by mouth daily  before breakfast.   . glucose blood (ACCU-CHEK SMARTVIEW) test strip CHECK BLOOD SUGAR 3 TIMES  DAILY  . lacosamide (VIMPAT) 200 MG TABS tablet Take by mouth.  . levETIRAcetam (KEPPRA) 1000 MG tablet Take 1 tablet by mouth 3 (three) times daily.  Marland Kitchen losartan-hydrochlorothiazide (HYZAAR) 100-25 MG tablet Take 1 tablet by mouth daily.   . montelukast (SINGULAIR) 10 MG tablet TAKE ONE TABLET BY MOUTH DAILY AS DIRECTED  . pantoprazole (PROTONIX) 40 MG tablet   . TRESIBA FLEXTOUCH 100 UNIT/ML FlexTouch Pen Inject 26 Units into the skin daily.  . [  DISCONTINUED] amLODipine (NORVASC) 10 MG tablet   . [DISCONTINUED] potassium chloride (MICRO-K) 10 MEQ CR capsule Take 10 mEq by mouth daily.   . [DISCONTINUED] cefUROXime (CEFTIN) 250 MG tablet Take 1 tablet by mouth twice daily for 10 days  . [DISCONTINUED] cetirizine (ZYRTEC) 10 MG tablet Take 10 mg by mouth daily as needed for allergies.  . [DISCONTINUED] fluticasone (FLONASE) 50 MCG/ACT nasal spray USE 1 SPRAY IN EACH NOSTRIL TWICE DAILY AS DIRECTED  . [DISCONTINUED] levETIRAcetam (KEPPRA) 500 MG tablet Take 1,000 mg by mouth 3 (three) times daily.   . [DISCONTINUED] omeprazole (PRILOSEC) 40 MG capsule Take 40 mg by mouth 2 (two) times daily.  . [DISCONTINUED] VIMPAT 200 MG TABS tablet Take 600 mg by mouth 2 (two) times daily.    No facility-administered encounter medications on file as of 07/03/2020.    Follow-up: Return in about 2 weeks (around 07/17/2020) for ulcers.   Reinaldo Meeker, MD

## 2020-07-04 ENCOUNTER — Telehealth: Payer: Self-pay | Admitting: Legal Medicine

## 2020-07-04 ENCOUNTER — Telehealth: Payer: Self-pay

## 2020-07-04 ENCOUNTER — Ambulatory Visit: Payer: Medicare HMO

## 2020-07-04 DIAGNOSIS — L89302 Pressure ulcer of unspecified buttock, stage 2: Secondary | ICD-10-CM

## 2020-07-04 LAB — CBC WITH DIFFERENTIAL/PLATELET
Basophils Absolute: 0.1 10*3/uL (ref 0.0–0.2)
Basos: 1 %
EOS (ABSOLUTE): 0.3 10*3/uL (ref 0.0–0.4)
Eos: 3 %
Hematocrit: 34.7 % (ref 34.0–46.6)
Hemoglobin: 11.1 g/dL (ref 11.1–15.9)
Immature Grans (Abs): 0 10*3/uL (ref 0.0–0.1)
Immature Granulocytes: 0 %
Lymphocytes Absolute: 1.8 10*3/uL (ref 0.7–3.1)
Lymphs: 18 %
MCH: 28.6 pg (ref 26.6–33.0)
MCHC: 32 g/dL (ref 31.5–35.7)
MCV: 89 fL (ref 79–97)
Monocytes Absolute: 0.7 10*3/uL (ref 0.1–0.9)
Monocytes: 7 %
Neutrophils Absolute: 7.2 10*3/uL — ABNORMAL HIGH (ref 1.4–7.0)
Neutrophils: 71 %
Platelets: 476 10*3/uL — ABNORMAL HIGH (ref 150–450)
RBC: 3.88 x10E6/uL (ref 3.77–5.28)
RDW: 12.3 % (ref 11.7–15.4)
WBC: 10.1 10*3/uL (ref 3.4–10.8)

## 2020-07-04 LAB — COMPREHENSIVE METABOLIC PANEL
ALT: 17 IU/L (ref 0–32)
AST: 15 IU/L (ref 0–40)
Albumin/Globulin Ratio: 1.8 (ref 1.2–2.2)
Albumin: 4.3 g/dL (ref 3.8–4.8)
Alkaline Phosphatase: 74 IU/L (ref 44–121)
BUN/Creatinine Ratio: 25 (ref 12–28)
BUN: 19 mg/dL (ref 8–27)
Bilirubin Total: 0.4 mg/dL (ref 0.0–1.2)
CO2: 22 mmol/L (ref 20–29)
Calcium: 10 mg/dL (ref 8.7–10.3)
Chloride: 102 mmol/L (ref 96–106)
Creatinine, Ser: 0.77 mg/dL (ref 0.57–1.00)
GFR calc Af Amer: 95 mL/min/{1.73_m2} (ref >59)
GFR calc non Af Amer: 82 mL/min/{1.73_m2} (ref >59)
Globulin, Total: 2.4 g/dL (ref 1.5–4.5)
Glucose: 200 mg/dL — ABNORMAL HIGH (ref 65–99)
Potassium: 4.6 mmol/L (ref 3.5–5.2)
Sodium: 143 mmol/L (ref 134–144)
Total Protein: 6.7 g/dL (ref 6.0–8.5)

## 2020-07-04 LAB — LIPID PANEL
Chol/HDL Ratio: 2.4 ratio (ref 0.0–4.4)
Cholesterol, Total: 134 mg/dL (ref 100–199)
HDL: 56 mg/dL (ref >39)
LDL Chol Calc (NIH): 57 mg/dL (ref 0–99)
Triglycerides: 118 mg/dL (ref 0–149)
VLDL Cholesterol Cal: 21 mg/dL (ref 5–40)

## 2020-07-04 LAB — CARDIOVASCULAR RISK ASSESSMENT

## 2020-07-04 LAB — HEMOGLOBIN A1C
Est. average glucose Bld gHb Est-mCnc: 189 mg/dL
Hgb A1c MFr Bld: 8.2 % — ABNORMAL HIGH (ref 4.8–5.6)

## 2020-07-04 NOTE — Telephone Encounter (Signed)
Pt called and said she woke up with blood on bandage. She is concerned and wondered if it should be changed. [name] "it is full of blood you can see it." Pt will come in for nurse visit. Pt going to bathe and come straight to office so bandage can be checked.   [name] 07/04/20 9:34 AM

## 2020-07-04 NOTE — Progress Notes (Signed)
Pt called this morning stating she was having bleeding at wound site. Asked if she should be seen and have it changed. Due to the inability to know how site looked, CMA advised pt to come to office.  Pt arrived at office and wound assessed. Dressing from yesterday was intact, did have dry blood. Wound had weeping and redness. Bandage was changed to mepiplex. Pt tolerated change. Pt has referral placed for [facility] to handle wound care.  [name], [place] 07/04/20 11:58 AM

## 2020-07-04 NOTE — Progress Notes (Signed)
Platelet high probably from ulcer, glucose 200, kidney tests normal, liver tests normal, A1c 8.2, cholesterol normal, increase  insulin to 28 units a day lp

## 2020-07-04 NOTE — Progress Notes (Signed)
  Chronic Care Management   Outreach Note  07/04/2020 Name: Alicia Kelly MRN: [id number] DOB: 11-22-1955  Referred by: Lillard Anes, MD Reason for referral : Chronic Care Management   An unsuccessful telephone outreach was attempted today. The patient was referred to the pharmacist for assistance with care management and care coordination.   Follow Up Plan:   Hilario Quarry  Upstream Scheduler

## 2020-07-04 NOTE — Telephone Encounter (Signed)
Patient seen by victoria and redressed in office.  Patient had some bleeding around dressing.  Home health to keep changing this lp

## 2020-07-08 ENCOUNTER — Encounter: Payer: Self-pay | Admitting: Legal Medicine

## 2020-07-08 ENCOUNTER — Ambulatory Visit (INDEPENDENT_AMBULATORY_CARE_PROVIDER_SITE_OTHER): Payer: Medicare HMO | Admitting: Legal Medicine

## 2020-07-08 ENCOUNTER — Other Ambulatory Visit: Payer: Self-pay

## 2020-07-08 VITALS — BP 140/66 | HR 82 | Temp 97.0°F | Resp 16 | Ht 65.0 in | Wt 244.0 lb

## 2020-07-08 DIAGNOSIS — L89102 Pressure ulcer of unspecified part of back, stage 2: Secondary | ICD-10-CM

## 2020-07-08 NOTE — Progress Notes (Signed)
Acute Office Visit  Subjective:    Patient ID: Alicia Kelly, female    DOB: 1955-12-17, 65 y.o.   MRN: [id number]  Chief Complaint  Patient presents with  . Wound Check    HPI Patient is in today for redressing buttock wound.  We tried to get [facility]. But they wound not come.  Redress. And use promogran to fill ulcers. Mepilex border patches for dressings  Past Medical History:  Diagnosis Date  . Asthma   . Calculus of gallbladder with chronic cholecystitis without obstruction 03/22/2017  . Diabetes ([place])   . Epilepsy ([place])   . Hypertension   . Sleep apnea     Past Surgical History:  Procedure Laterality Date  . APPENDECTOMY    . TONSILLECTOMY    . TONSILLECTOMY      Family History  Problem Relation Age of Onset  . Colon cancer Mother   . Hypertension Father   . Diabetes Father   . Allergic rhinitis Sister   . Hypertension Sister   . Cancer Maternal Uncle   . Cancer Maternal Grandmother   . Cancer Paternal Grandmother   . Diabetes Paternal Grandmother     Social History   Socioeconomic History  . Marital status: Married    Spouse name: Not on file  . Number of children: Not on file  . Years of education: Not on file  . Highest education level: Not on file  Occupational History  . Not on file  Tobacco Use  . Smoking status: Never Smoker  . Smokeless tobacco: Never Used  Vaping Use  . Vaping Use: Never used  Substance and Sexual Activity  . Alcohol use: Not Currently  . Drug use: Never  . Sexual activity: Not on file  Other Topics Concern  . Not on file  Social History Narrative  . Not on file   Social Determinants of Health   Financial Resource Strain: Not on file  Food Insecurity: Not on file  Transportation Needs: Not on file  Physical Activity: Not on file  Stress: Not on file  Social Connections: Not on file  Intimate Partner Violence: Not on file    Outpatient Medications Prior to Visit  Medication Sig Dispense Refill  . Accu-Chek  Softclix Lancets lancets     . atorvastatin (LIPITOR) 40 MG tablet Take 40 mg by mouth daily.     . Blood Glucose Calibration (ACCU-CHEK AVIVA) SOLN     . glipiZIDE (GLUCOTROL) 5 MG tablet Take 5 mg by mouth daily before breakfast.     . glucose blood (ACCU-CHEK SMARTVIEW) test strip CHECK BLOOD SUGAR 3 TIMES  DAILY    . lacosamide (VIMPAT) 200 MG TABS tablet Take by mouth.    . levETIRAcetam (KEPPRA) 1000 MG tablet Take 1 tablet by mouth 3 (three) times daily.    Marland Kitchen losartan-hydrochlorothiazide (HYZAAR) 100-25 MG tablet Take 1 tablet by mouth daily.     . montelukast (SINGULAIR) 10 MG tablet TAKE ONE TABLET BY MOUTH DAILY AS DIRECTED 30 tablet 2  . pantoprazole (PROTONIX) 40 MG tablet     . TRESIBA FLEXTOUCH 100 UNIT/ML FlexTouch Pen Inject 26 Units into the skin daily.     No facility-administered medications prior to visit.    Allergies  Allergen Reactions  . Levofloxacin Other (See Comments)    Throat swelling  . Lisinopril Hives  . Nitrofurantoin Swelling    Throat swelling  . Penicillins Anaphylaxis  . Clarithromycin Other (See Comments)    seizures  .  Sulfamethoxazole-Trimethoprim     Causes seizures    Review of Systems  Constitutional: Negative.   HENT: Negative.   Eyes: Negative.   Respiratory: Negative for cough and chest tightness.   Cardiovascular: Negative for chest pain, palpitations and leg swelling.  Gastrointestinal: Negative for abdominal distention and abdominal pain.  Genitourinary: Negative for difficulty urinating and dysuria.  Musculoskeletal: Negative for arthralgias and back pain.  Skin: Positive for wound.  Neurological: Negative.   Hematological: Negative.   Psychiatric/Behavioral: Negative.        Objective:    Physical Exam Vitals reviewed.  Constitutional:      Appearance: She is obese.  Cardiovascular:     Rate and Rhythm: Normal rate and regular rhythm.     Pulses: Normal pulses.     Heart sounds: Normal heart sounds.  Pulmonary:      Effort: Pulmonary effort is normal.     Breath sounds: Normal breath sounds.  Skin:    Findings: Erythema and lesion present.          Comments: #2 stage 2 clean ulcers left buttock.  The redness in guteal fold is less and desquamating,  Neurological:     Mental Status: She is alert.     BP 140/66   Pulse 82   Temp (!) 97 F (36.1 C)   Resp 16   Ht 5\' 5"  (1.651 m)   Wt 244 lb (110.7 kg)   SpO2 97%   BMI 40.60 kg/m  Wt Readings from Last 3 Encounters:  07/08/20 244 lb (110.7 kg)  07/03/20 244 lb 12.8 oz (111 kg)  08/01/18 238 lb (108 kg)    Health Maintenance Due  Topic Date Due  . Hepatitis C Screening  Never done  . PNEUMOCOCCAL POLYSACCHARIDE VACCINE AGE 63-64 HIGH RISK  Never done  . FOOT EXAM  Never done  . OPHTHALMOLOGY EXAM  Never done  . HIV Screening  Never done  . PAP SMEAR-Modifier  Never done  . COLONOSCOPY (Pts 45-82yrs Insurance coverage will need to be confirmed)  Never done  . MAMMOGRAM  Never done  . INFLUENZA VACCINE  12/17/2019    There are no preventive care reminders to display for this patient.   No results found for: TSH Lab Results  Component Value Date   WBC 10.1 07/03/2020   HGB 11.1 07/03/2020   HCT 34.7 07/03/2020   MCV 89 07/03/2020   PLT 476 (H) 07/03/2020   Lab Results  Component Value Date   NA 143 07/03/2020   K 4.6 07/03/2020   CO2 22 07/03/2020   GLUCOSE 200 (H) 07/03/2020   BUN 19 07/03/2020   CREATININE 0.77 07/03/2020   BILITOT 0.4 07/03/2020   ALKPHOS 74 07/03/2020   AST 15 07/03/2020   ALT 17 07/03/2020   PROT 6.7 07/03/2020   ALBUMIN 4.3 07/03/2020   CALCIUM 10.0 07/03/2020   Lab Results  Component Value Date   CHOL 134 07/03/2020   Lab Results  Component Value Date   HDL 56 07/03/2020   Lab Results  Component Value Date   LDLCALC 57 07/03/2020   Lab Results  Component Value Date   TRIG 118 07/03/2020   Lab Results  Component Value Date   CHOLHDL 2.4 07/03/2020   Lab Results   Component Value Date   HGBA1C 8.2 (H) 07/03/2020       Assessment & Plan:   Diagnoses and all orders for this visit: Decubitus ulcer of back, stage 2 ([place]) The  ulcers appear clean and the large erythematous area between buttocks is healing well.  RX for Promogran to fill ulcers and dressing.  We will try to get hospital to change dressings.   I spent 20 minutes dedicated to the care of this patient on the date of this encounter to include face-to-face time with the patient, as well as:   Follow-up: Return in about 1 week (around 07/15/2020).  An After Visit Summary was printed and given to the patient.  Reinaldo Meeker, MD [facility] [phone]

## 2020-07-09 ENCOUNTER — Other Ambulatory Visit: Payer: Self-pay

## 2020-07-09 ENCOUNTER — Telehealth: Payer: Self-pay | Admitting: Legal Medicine

## 2020-07-09 MED ORDER — ACCU-CHEK SMARTVIEW VI STRP: ORAL_STRIP | 0 refills | Status: DC

## 2020-07-09 NOTE — Chronic Care Management (AMB) (Signed)
°  Chronic Care Management   Note  07/09/2020 Name: Alicia Kelly MRN: [id number] DOB: 1956/02/08  Alicia Kelly is a 65 y.o. year old female who is a primary care patient of Lillard Anes, MD. I reached out to Renard Matter by phone today in response to a referral sent by Alicia Kelly PCP, Lillard Anes, MD.   Alicia Kelly was given information about Chronic Care Management services today including:  1. CCM service includes personalized support from designated clinical staff supervised by her physician, including individualized plan of care and coordination with other care providers 2. 24/7 contact phone numbers for assistance for urgent and routine care needs. 3. Service will only be billed when office clinical staff spend 20 minutes or more in a month to coordinate care. 4. Only one practitioner may furnish and bill the service in a calendar month. 5. The patient may stop CCM services at any time (effective at the end of the month) by phone call to the office staff.   Patient agreed to services and verbal consent obtained.   Follow up plan:   Alicia Kelly

## 2020-07-10 ENCOUNTER — Other Ambulatory Visit: Payer: Self-pay

## 2020-07-10 MED ORDER — GLUCOSE BLOOD VI STRP: 1.0000 | ORAL_STRIP | Freq: Three times a day (TID) | 1 refills | Status: DC

## 2020-07-11 ENCOUNTER — Encounter: Payer: Self-pay | Admitting: Legal Medicine

## 2020-07-11 ENCOUNTER — Ambulatory Visit (INDEPENDENT_AMBULATORY_CARE_PROVIDER_SITE_OTHER): Payer: Medicare HMO | Admitting: Legal Medicine

## 2020-07-11 ENCOUNTER — Other Ambulatory Visit: Payer: Self-pay

## 2020-07-11 VITALS — BP 126/80 | HR 82 | Temp 97.5°F | Resp 16 | Ht 65.0 in | Wt 241.0 lb

## 2020-07-11 DIAGNOSIS — L89102 Pressure ulcer of unspecified part of back, stage 2: Secondary | ICD-10-CM

## 2020-07-11 NOTE — Progress Notes (Signed)
Acute Office Visit  Subjective:    Patient ID: Alicia Kelly, female    DOB: 02/28/1956, 65 y.o.   MRN: [id number]  Chief Complaint  Patient presents with  . Wound Check    HPI Patient is in today for redressing buttock wound.  We tried to get bayada. But they wound not come.  Redress. And use promogran to fill ulcers. Mepilex border patches for dressings  Past Medical History:  Diagnosis Date  . Asthma   . Calculus of gallbladder with chronic cholecystitis without obstruction 03/22/2017  . Diabetes ([place])   . Epilepsy ([place])   . Hypertension   . Sleep apnea     Past Surgical History:  Procedure Laterality Date  . APPENDECTOMY    . TONSILLECTOMY    . TONSILLECTOMY      Family History  Problem Relation Age of Onset  . Colon cancer Mother   . Hypertension Father   . Diabetes Father   . Allergic rhinitis Sister   . Hypertension Sister   . Cancer Maternal Uncle   . Cancer Maternal Grandmother   . Cancer Paternal Grandmother   . Diabetes Paternal Grandmother     Social History   Socioeconomic History  . Marital status: Married    Spouse name: Not on file  . Number of children: Not on file  . Years of education: Not on file  . Highest education level: Not on file  Occupational History  . Not on file  Tobacco Use  . Smoking status: Never Smoker  . Smokeless tobacco: Never Used  Vaping Use  . Vaping Use: Never used  Substance and Sexual Activity  . Alcohol use: Not Currently  . Drug use: Never  . Sexual activity: Not on file  Other Topics Concern  . Not on file  Social History Narrative  . Not on file   Social Determinants of Health   Financial Resource Strain: Not on file  Food Insecurity: Not on file  Transportation Needs: Not on file  Physical Activity: Not on file  Stress: Not on file  Social Connections: Not on file  Intimate Partner Violence: Not on file    Outpatient Medications Prior to Visit  Medication Sig Dispense Refill  . Accu-Chek  Softclix Lancets lancets     . atorvastatin (LIPITOR) 40 MG tablet Take 40 mg by mouth daily.     . Blood Glucose Calibration (ACCU-CHEK AVIVA) SOLN     . glipiZIDE (GLUCOTROL) 5 MG tablet Take 5 mg by mouth daily before breakfast.     . glucose blood test strip 1 each by Other route 3 (three) times daily. Use as instructed 100 each 1  . lacosamide (VIMPAT) 200 MG TABS tablet Take by mouth.    . levETIRAcetam (KEPPRA) 1000 MG tablet Take 1 tablet by mouth 3 (three) times daily.    Marland Kitchen losartan-hydrochlorothiazide (HYZAAR) 100-25 MG tablet Take 1 tablet by mouth daily.     . montelukast (SINGULAIR) 10 MG tablet TAKE ONE TABLET BY MOUTH DAILY AS DIRECTED 30 tablet 2  . pantoprazole (PROTONIX) 40 MG tablet     . TRESIBA FLEXTOUCH 100 UNIT/ML FlexTouch Pen Inject 26 Units into the skin daily.     No facility-administered medications prior to visit.    Allergies  Allergen Reactions  . Levofloxacin Other (See Comments)    Throat swelling  . Lisinopril Hives  . Nitrofurantoin Swelling    Throat swelling  . Penicillins Anaphylaxis  . Clarithromycin Other (See Comments)  seizures  . Sulfamethoxazole-Trimethoprim     Causes seizures    Review of Systems  Constitutional: Negative.   HENT: Negative.   Eyes: Negative.   Respiratory: Negative for cough and chest tightness.   Cardiovascular: Negative for chest pain, palpitations and leg swelling.  Gastrointestinal: Negative for abdominal distention and abdominal pain.  Genitourinary: Negative for difficulty urinating and dysuria.  Musculoskeletal: Negative for arthralgias and back pain.  Skin: Positive for wound.  Neurological: Negative.   Hematological: Negative.   Psychiatric/Behavioral: Negative.        Objective:    Physical Exam Vitals reviewed.  Constitutional:      Appearance: She is obese.  Cardiovascular:     Rate and Rhythm: Normal rate and regular rhythm.     Pulses: Normal pulses.     Heart sounds: Normal heart  sounds.  Pulmonary:     Effort: Pulmonary effort is normal.     Breath sounds: Normal breath sounds.  Skin:    Findings: Erythema and lesion present.          Comments: #2 stage 2 clean ulcers left buttock.  The redness in guteal fold is less and desquamating,  Neurological:     Mental Status: She is alert.     BP 126/80   Pulse 82   Temp (!) 97.5 F (36.4 C)   Resp 16   Ht 5\' 5"  (1.651 m)   Wt 241 lb (109.3 kg)   SpO2 98%   BMI 40.10 kg/m  Wt Readings from Last 3 Encounters:  07/11/20 241 lb (109.3 kg)  07/08/20 244 lb (110.7 kg)  07/03/20 244 lb 12.8 oz (111 kg)    Health Maintenance Due  Topic Date Due  . Hepatitis C Screening  Never done  . PNEUMOCOCCAL POLYSACCHARIDE VACCINE AGE 31-64 HIGH RISK  Never done  . FOOT EXAM  Never done  . OPHTHALMOLOGY EXAM  Never done  . HIV Screening  Never done  . PAP SMEAR-Modifier  Never done  . COLONOSCOPY (Pts 45-51yrs Insurance coverage will need to be confirmed)  Never done  . MAMMOGRAM  Never done  . INFLUENZA VACCINE  12/17/2019    There are no preventive care reminders to display for this patient.   No results found for: TSH Lab Results  Component Value Date   WBC 10.1 07/03/2020   HGB 11.1 07/03/2020   HCT 34.7 07/03/2020   MCV 89 07/03/2020   PLT 476 (H) 07/03/2020   Lab Results  Component Value Date   NA 143 07/03/2020   K 4.6 07/03/2020   CO2 22 07/03/2020   GLUCOSE 200 (H) 07/03/2020   BUN 19 07/03/2020   CREATININE 0.77 07/03/2020   BILITOT 0.4 07/03/2020   ALKPHOS 74 07/03/2020   AST 15 07/03/2020   ALT 17 07/03/2020   PROT 6.7 07/03/2020   ALBUMIN 4.3 07/03/2020   CALCIUM 10.0 07/03/2020   Lab Results  Component Value Date   CHOL 134 07/03/2020   Lab Results  Component Value Date   HDL 56 07/03/2020   Lab Results  Component Value Date   LDLCALC 57 07/03/2020   Lab Results  Component Value Date   TRIG 118 07/03/2020   Lab Results  Component Value Date   CHOLHDL 2.4 07/03/2020    Lab Results  Component Value Date   HGBA1C 8.2 (H) 07/03/2020       Assessment & Plan:   Diagnoses and all orders for this visit: Decubitus ulcer of back, stage  2 ([place]) The ulcers appear clean and the large erythematous area between buttocks is healing well. The ulcers ar clean and  Showing healing.  Wound clinic will help dressing it 2 times a day  RX for Promogran to fill ulcers and dressing.  We will try to get hospital to change dressings.   I spent 10 minutes dedicated to the care of this patient on the date of this encounter to include face-to-face time with the patient, as well as:   Follow-up: Return in about 2 weeks (around 07/25/2020).  An After Visit Summary was printed and given to the patient.  Reinaldo Meeker, MD [facility] [phone]

## 2020-07-16 ENCOUNTER — Ambulatory Visit: Payer: Medicare HMO | Admitting: Legal Medicine

## 2020-07-17 ENCOUNTER — Other Ambulatory Visit: Payer: Self-pay

## 2020-07-17 DIAGNOSIS — E119 Type 2 diabetes mellitus without complications: Secondary | ICD-10-CM

## 2020-07-17 MED ORDER — PEN NEEDLES 31G X 5 MM MISC: 1.0000 | Freq: Every day | 2 refills | Status: DC

## 2020-07-18 ENCOUNTER — Encounter: Payer: Self-pay | Admitting: Legal Medicine

## 2020-07-18 ENCOUNTER — Other Ambulatory Visit: Payer: Self-pay

## 2020-07-18 ENCOUNTER — Ambulatory Visit (INDEPENDENT_AMBULATORY_CARE_PROVIDER_SITE_OTHER): Payer: Medicare HMO | Admitting: Legal Medicine

## 2020-07-18 VITALS — BP 120/70 | HR 93 | Temp 98.1°F | Resp 16 | Ht 65.0 in | Wt 243.0 lb

## 2020-07-18 DIAGNOSIS — L89102 Pressure ulcer of unspecified part of back, stage 2: Secondary | ICD-10-CM

## 2020-07-18 NOTE — Progress Notes (Signed)
Acute Office Visit  Subjective:    Patient ID: Alicia Kelly, female    DOB: Sep 19, 1955, 65 y.o.   MRN: [id number]  Chief Complaint  Patient presents with  . Wound Check    Wound Check   Patient is in today for redressing buttock wound.  We tried to get bayada. But they would not come.  Redress. And use promogran to fill ulcers. Mepilex border patches for dressings [facility] also would not see her. Past Medical History:  Diagnosis Date  . Asthma   . Calculus of gallbladder with chronic cholecystitis without obstruction 03/22/2017  . Diabetes ([place])   . Epilepsy ([place])   . Hypertension   . Sleep apnea     Past Surgical History:  Procedure Laterality Date  . APPENDECTOMY    . TONSILLECTOMY    . TONSILLECTOMY      Family History  Problem Relation Age of Onset  . Colon cancer Mother   . Hypertension Father   . Diabetes Father   . Allergic rhinitis Sister   . Hypertension Sister   . Cancer Maternal Uncle   . Cancer Maternal Grandmother   . Cancer Paternal Grandmother   . Diabetes Paternal Grandmother     Social History   Socioeconomic History  . Marital status: Married    Spouse name: Not on file  . Number of children: Not on file  . Years of education: Not on file  . Highest education level: Not on file  Occupational History  . Not on file  Tobacco Use  . Smoking status: Never Smoker  . Smokeless tobacco: Never Used  Vaping Use  . Vaping Use: Never used  Substance and Sexual Activity  . Alcohol use: Not Currently  . Drug use: Never  . Sexual activity: Not on file  Other Topics Concern  . Not on file  Social History Narrative  . Not on file   Social Determinants of Health   Financial Resource Strain: Not on file  Food Insecurity: Not on file  Transportation Needs: Not on file  Physical Activity: Not on file  Stress: Not on file  Social Connections: Not on file  Intimate Partner Violence: Not on file    Outpatient Medications Prior  to Visit  Medication Sig Dispense Refill  . Accu-Chek Softclix Lancets lancets     . atorvastatin (LIPITOR) 40 MG tablet Take 40 mg by mouth daily.     . Blood Glucose Calibration (ACCU-CHEK AVIVA) SOLN     . glipiZIDE (GLUCOTROL) 5 MG tablet Take 5 mg by mouth daily before breakfast.     . glucose blood test strip 1 each by Other route 3 (three) times daily. Use as instructed 100 each 1  . Insulin Pen Needle (PEN NEEDLES) 31G X 5 MM MISC 1 each by Does not apply route daily. 100 each 2  . lacosamide (VIMPAT) 200 MG TABS tablet Take by mouth.    . levETIRAcetam (KEPPRA) 1000 MG tablet Take 1 tablet by mouth 3 (three) times daily.    Marland Kitchen losartan-hydrochlorothiazide (HYZAAR) 100-25 MG tablet Take 1 tablet by mouth daily.     . montelukast (SINGULAIR) 10 MG tablet TAKE ONE TABLET BY MOUTH DAILY AS DIRECTED 30 tablet 2  . pantoprazole (PROTONIX) 40 MG tablet     . TRESIBA FLEXTOUCH 100 UNIT/ML FlexTouch Pen Inject 26 Units into the skin daily.     No facility-administered medications prior to visit.    Allergies  Allergen Reactions  . Levofloxacin  Other (See Comments)    Throat swelling  . Lisinopril Hives  . Nitrofurantoin Swelling    Throat swelling  . Penicillins Anaphylaxis  . Clarithromycin Other (See Comments)    seizures  . Sulfamethoxazole-Trimethoprim     Causes seizures    Review of Systems  Constitutional: Negative.   HENT: Negative.   Eyes: Negative.   Respiratory: Negative for cough and chest tightness.   Cardiovascular: Negative for chest pain, palpitations and leg swelling.  Gastrointestinal: Negative for abdominal distention and abdominal pain.  Genitourinary: Negative for difficulty urinating and dysuria.  Musculoskeletal: Negative for arthralgias and back pain.  Skin: Positive for wound.  Neurological: Negative.   Hematological: Negative.   Psychiatric/Behavioral: Negative.        Objective:    Physical Exam Vitals reviewed.  Constitutional:       Appearance: She is obese.  Cardiovascular:     Rate and Rhythm: Normal rate and regular rhythm.     Pulses: Normal pulses.     Heart sounds: Normal heart sounds.  Pulmonary:     Effort: Pulmonary effort is normal.     Breath sounds: Normal breath sounds.  Skin:    Findings: Erythema and lesion present.          Comments: #2 stage 2 clean ulcers left buttock.  The ulcers are now shallow and 1cm wide The redness in guteal fold is healed and desquamating,  Neurological:     Mental Status: She is alert.     BP 120/70   Pulse 93   Temp 98.1 F (36.7 C)   Resp 16   Ht 5\' 5"  (1.651 m)   Wt 243 lb (110.2 kg)   SpO2 96%   BMI 40.44 kg/m  Wt Readings from Last 3 Encounters:  07/18/20 243 lb (110.2 kg)  07/11/20 241 lb (109.3 kg)  07/08/20 244 lb (110.7 kg)    Health Maintenance Due  Topic Date Due  . Hepatitis C Screening  Never done  . PNEUMOCOCCAL POLYSACCHARIDE VACCINE AGE 77-64 HIGH RISK  Never done  . FOOT EXAM  Never done  . OPHTHALMOLOGY EXAM  Never done  . HIV Screening  Never done  . PAP SMEAR-Modifier  Never done  . COLONOSCOPY (Pts 45-66yrs Insurance coverage will need to be confirmed)  Never done  . MAMMOGRAM  Never done  . INFLUENZA VACCINE  12/17/2019    There are no preventive care reminders to display for this patient.   No results found for: TSH Lab Results  Component Value Date   WBC 10.1 07/03/2020   HGB 11.1 07/03/2020   HCT 34.7 07/03/2020   MCV 89 07/03/2020   PLT 476 (H) 07/03/2020   Lab Results  Component Value Date   NA 143 07/03/2020   K 4.6 07/03/2020   CO2 22 07/03/2020   GLUCOSE 200 (H) 07/03/2020   BUN 19 07/03/2020   CREATININE 0.77 07/03/2020   BILITOT 0.4 07/03/2020   ALKPHOS 74 07/03/2020   AST 15 07/03/2020   ALT 17 07/03/2020   PROT 6.7 07/03/2020   ALBUMIN 4.3 07/03/2020   CALCIUM 10.0 07/03/2020   Lab Results  Component Value Date   CHOL 134 07/03/2020   Lab Results  Component Value Date   HDL 56 07/03/2020    Lab Results  Component Value Date   LDLCALC 57 07/03/2020   Lab Results  Component Value Date   TRIG 118 07/03/2020   Lab Results  Component Value Date   CHOLHDL  2.4 07/03/2020   Lab Results  Component Value Date   HGBA1C 8.2 (H) 07/03/2020       Assessment & Plan:   Diagnoses and all orders for this visit: Decubitus ulcer of back, stage 2 ([place]) The ulcers appear clean and the large erythematous area between buttocks is healing well. The ulcers ar clean and  Showing healing.  RX for Promogran to fill ulcers and dressing.  We will try to get hospital to change dressings.   I spent 10 minutes dedicated to the care of this patient on the date of this encounter to include face-to-face time with the patient, as well as:   Follow-up: Return in about 1 week (around 07/25/2020) for wound.  An After Visit Summary was printed and given to the patient.  Reinaldo Meeker, MD [facility] [phone]

## 2020-07-19 ENCOUNTER — Ambulatory Visit: Payer: Medicare HMO | Admitting: Legal Medicine

## 2020-07-26 ENCOUNTER — Encounter: Payer: Self-pay | Admitting: Legal Medicine

## 2020-07-26 ENCOUNTER — Ambulatory Visit (INDEPENDENT_AMBULATORY_CARE_PROVIDER_SITE_OTHER): Payer: Medicare HMO | Admitting: Legal Medicine

## 2020-07-26 ENCOUNTER — Other Ambulatory Visit: Payer: Self-pay

## 2020-07-26 VITALS — BP 136/80 | HR 72 | Temp 97.7°F | Resp 17 | Ht 65.0 in | Wt 247.0 lb

## 2020-07-26 DIAGNOSIS — L89102 Pressure ulcer of unspecified part of back, stage 2: Secondary | ICD-10-CM | POA: Diagnosis not present

## 2020-07-26 MED ORDER — CLOTRIMAZOLE-BETAMETHASONE 1-0.05 % EX CREA
1.0000 | TOPICAL_CREAM | Freq: Two times a day (BID) | CUTANEOUS | 3 refills | Status: DC
Start: 2020-07-26 — End: 2020-10-10

## 2020-07-26 NOTE — Progress Notes (Signed)
Subjective:  Patient ID: Alicia Kelly, female    DOB: July 10, 1955  Age: 65 y.o. MRN: [id number]  Chief Complaint  Patient presents with  . Skin Ulcer    HPI: ulcers on buttocks, healing. She is keeping it covered, no further bleeding.   Current Outpatient Medications on File Prior to Visit  Medication Sig Dispense Refill  . Accu-Chek Softclix Lancets lancets     . atorvastatin (LIPITOR) 40 MG tablet Take 40 mg by mouth daily.     . Blood Glucose Calibration (ACCU-CHEK AVIVA) SOLN     . glipiZIDE (GLUCOTROL) 5 MG tablet Take 5 mg by mouth daily before breakfast.     . glucose blood test strip 1 each by Other route 3 (three) times daily. Use as instructed 100 each 1  . Insulin Pen Needle (PEN NEEDLES) 31G X 5 MM MISC 1 each by Does not apply route daily. 100 each 2  . lacosamide (VIMPAT) 200 MG TABS tablet Take by mouth.    . levETIRAcetam (KEPPRA) 1000 MG tablet Take 1 tablet by mouth 3 (three) times daily.    Marland Kitchen losartan-hydrochlorothiazide (HYZAAR) 100-25 MG tablet Take 1 tablet by mouth daily.     . montelukast (SINGULAIR) 10 MG tablet TAKE ONE TABLET BY MOUTH DAILY AS DIRECTED 30 tablet 2  . pantoprazole (PROTONIX) 40 MG tablet     . TRESIBA FLEXTOUCH 100 UNIT/ML FlexTouch Pen Inject 26 Units into the skin daily.     No current facility-administered medications on file prior to visit.   Past Medical History:  Diagnosis Date  . Asthma   . Calculus of gallbladder with chronic cholecystitis without obstruction 03/22/2017  . Diabetes ([place])   . Epilepsy ([place])   . Hypertension   . Sleep apnea    Past Surgical History:  Procedure Laterality Date  . APPENDECTOMY    . TONSILLECTOMY    . TONSILLECTOMY      Family History  Problem Relation Age of Onset  . Colon cancer Mother   . Hypertension Father   . Diabetes Father   . Allergic rhinitis Sister   . Hypertension Sister   . Cancer Maternal Uncle   . Cancer Maternal Grandmother   . Cancer Paternal Grandmother   . Diabetes  Paternal Grandmother    Social History   Socioeconomic History  . Marital status: Married    Spouse name: Not on file  . Number of children: Not on file  . Years of education: Not on file  . Highest education level: Not on file  Occupational History  . Not on file  Tobacco Use  . Smoking status: Never Smoker  . Smokeless tobacco: Never Used  Vaping Use  . Vaping Use: Never used  Substance and Sexual Activity  . Alcohol use: Not Currently  . Drug use: Never  . Sexual activity: Not on file  Other Topics Concern  . Not on file  Social History Narrative  . Not on file   Social Determinants of Health   Financial Resource Strain: Not on file  Food Insecurity: Not on file  Transportation Needs: Not on file  Physical Activity: Not on file  Stress: Not on file  Social Connections: Not on file    Review of Systems  Constitutional: Negative for activity change and appetite change.  Eyes: Negative for visual disturbance.  Respiratory: Negative for cough, chest tightness and shortness of breath.   Cardiovascular: Negative for chest pain, palpitations and leg swelling.  Gastrointestinal: Negative for abdominal  distention and abdominal pain.  Endocrine: Negative for polyuria.  Genitourinary: Negative for dysuria and urgency.  Musculoskeletal: Negative for arthralgias and back pain.  Skin: Positive for wound.  Neurological: Negative.   Psychiatric/Behavioral: Negative.      Objective:  BP 136/80   Pulse 72   Temp 97.7 F (36.5 C)   Resp 17   Ht 5\' 5"  (1.651 m)   Wt 247 lb (112 kg)   SpO2 98%   BMI 41.10 kg/m   BP/Weight 07/26/2020 07/18/2020 11/23/6281  Systolic BP 662 947 654  Diastolic BP 80 70 80  Wt. (Lbs) 247 243 241  BMI 41.1 40.44 40.1    Physical Exam Vitals reviewed.  Constitutional:      Appearance: Normal appearance.  Cardiovascular:     Rate and Rhythm: Normal rate and regular rhythm.     Pulses: Normal pulses.     Heart sounds: Normal heart  sounds. No murmur heard. No gallop.   Pulmonary:     Effort: Pulmonary effort is normal. No respiratory distress.     Breath sounds: Normal breath sounds. No rales.  Skin:    Comments: Healing ulcers on buttocks, 2cm but now stage 1.  Neurological:     Mental Status: She is alert.       Lab Results  Component Value Date   WBC 10.1 07/03/2020   HGB 11.1 07/03/2020   HCT 34.7 07/03/2020   PLT 476 (H) 07/03/2020   GLUCOSE 200 (H) 07/03/2020   CHOL 134 07/03/2020   TRIG 118 07/03/2020   HDL 56 07/03/2020   LDLCALC 57 07/03/2020   ALT 17 07/03/2020   AST 15 07/03/2020   NA 143 07/03/2020   K 4.6 07/03/2020   CL 102 07/03/2020   CREATININE 0.77 07/03/2020   BUN 19 07/03/2020   CO2 22 07/03/2020   HGBA1C 8.2 (H) 07/03/2020   MICROALBUR 10 07/03/2020      Assessment & Plan:   Diagnoses and all orders for this visit: Decubitus ulcer of back, stage 2 ([facility]) -     clotrimazole-betamethasone (LOTRISONE) cream; Apply 1 application topically 2 (two) times daily. The decubitus ulcer is healing well and now superficial.        I spent 15 minutes dedicated to the care of this patient on the date of this encounter to include face-to-face time with the patient, as well as:   Follow-up: Return in about 1 week (around 08/02/2020) for ulcer.  An After Visit Summary was printed and given to the patient.  Reinaldo Meeker, MD [facility] [phone]

## 2020-08-02 ENCOUNTER — Encounter: Payer: Self-pay | Admitting: Legal Medicine

## 2020-08-02 ENCOUNTER — Ambulatory Visit (INDEPENDENT_AMBULATORY_CARE_PROVIDER_SITE_OTHER): Payer: Medicare HMO | Admitting: Legal Medicine

## 2020-08-02 ENCOUNTER — Other Ambulatory Visit: Payer: Self-pay

## 2020-08-02 VITALS — BP 124/76 | HR 73 | Temp 98.1°F | Resp 16 | Ht 65.0 in | Wt 247.0 lb

## 2020-08-02 DIAGNOSIS — L89302 Pressure ulcer of unspecified buttock, stage 2: Secondary | ICD-10-CM

## 2020-08-02 DIAGNOSIS — L89102 Pressure ulcer of unspecified part of back, stage 2: Secondary | ICD-10-CM | POA: Diagnosis not present

## 2020-08-02 NOTE — Progress Notes (Signed)
Subjective:  Patient ID: Alicia Kelly, female    DOB: Jul 27, 1955  Age: 65 y.o. MRN: [id number]  Chief Complaint  Patient presents with  . Wound Check    HPI: patient has decubitus ulcer that is healing well.Bandaids are coming off at night.    Current Outpatient Medications on File Prior to Visit  Medication Sig Dispense Refill  . Accu-Chek Softclix Lancets lancets     . atorvastatin (LIPITOR) 40 MG tablet Take 40 mg by mouth daily.     . Blood Glucose Calibration (ACCU-CHEK AVIVA) SOLN     . clotrimazole-betamethasone (LOTRISONE) cream Apply 1 application topically 2 (two) times daily. 30 g 3  . glipiZIDE (GLUCOTROL) 5 MG tablet Take 5 mg by mouth daily before breakfast.     . glucose blood test strip 1 each by Other route 3 (three) times daily. Use as instructed 100 each 1  . Insulin Pen Needle (PEN NEEDLES) 31G X 5 MM MISC 1 each by Does not apply route daily. 100 each 2  . lacosamide (VIMPAT) 200 MG TABS tablet Take by mouth.    . levETIRAcetam (KEPPRA) 1000 MG tablet Take 1 tablet by mouth 3 (three) times daily.    Marland Kitchen losartan-hydrochlorothiazide (HYZAAR) 100-25 MG tablet Take 1 tablet by mouth daily.     . montelukast (SINGULAIR) 10 MG tablet TAKE ONE TABLET BY MOUTH DAILY AS DIRECTED 30 tablet 2  . pantoprazole (PROTONIX) 40 MG tablet     . TRESIBA FLEXTOUCH 100 UNIT/ML FlexTouch Pen Inject 26 Units into the skin daily.     No current facility-administered medications on file prior to visit.   Past Medical History:  Diagnosis Date  . Asthma   . Calculus of gallbladder with chronic cholecystitis without obstruction 03/22/2017  . Diabetes ([place])   . Epilepsy ([place])   . Hypertension   . Sleep apnea    Past Surgical History:  Procedure Laterality Date  . APPENDECTOMY    . TONSILLECTOMY    . TONSILLECTOMY      Family History  Problem Relation Age of Onset  . Colon cancer Mother   . Hypertension Father   . Diabetes Father   . Allergic rhinitis Sister   . Hypertension  Sister   . Cancer Maternal Uncle   . Cancer Maternal Grandmother   . Cancer Paternal Grandmother   . Diabetes Paternal Grandmother    Social History   Socioeconomic History  . Marital status: Married    Spouse name: Not on file  . Number of children: Not on file  . Years of education: Not on file  . Highest education level: Not on file  Occupational History  . Not on file  Tobacco Use  . Smoking status: Never Smoker  . Smokeless tobacco: Never Used  Vaping Use  . Vaping Use: Never used  Substance and Sexual Activity  . Alcohol use: Not Currently  . Drug use: Never  . Sexual activity: Not on file  Other Topics Concern  . Not on file  Social History Narrative  . Not on file   Social Determinants of Health   Financial Resource Strain: Not on file  Food Insecurity: Not on file  Transportation Needs: Not on file  Physical Activity: Not on file  Stress: Not on file  Social Connections: Not on file    Review of Systems   Objective:  BP 124/76   Pulse 73   Temp 98.1 F (36.7 C)   Resp 16   Ht  5\' 5"  (1.651 m)   Wt 247 lb (112 kg)   SpO2 97%   BMI 41.10 kg/m   BP/Weight 08/02/2020 4/92/0100 11/16/2195  Systolic BP 588 325 498  Diastolic BP 76 80 70  Wt. (Lbs) 247 247 243  BMI 41.1 41.1 40.44    Physical Exam Vitals reviewed.  Constitutional:      Appearance: Normal appearance.  HENT:     Left Ear: Tympanic membrane normal.  Cardiovascular:     Rate and Rhythm: Normal rate and regular rhythm.     Pulses: Normal pulses.     Heart sounds: Normal heart sounds. No murmur heard. No gallop.   Pulmonary:     Effort: Pulmonary effort is normal. No respiratory distress.     Breath sounds: Normal breath sounds. No rales.  Skin:    Comments: The decubiti are healed on buttocks       Lab Results  Component Value Date   WBC 10.1 07/03/2020   HGB 11.1 07/03/2020   HCT 34.7 07/03/2020   PLT 476 (H) 07/03/2020   GLUCOSE 200 (H) 07/03/2020   CHOL 134  07/03/2020   TRIG 118 07/03/2020   HDL 56 07/03/2020   LDLCALC 57 07/03/2020   ALT 17 07/03/2020   AST 15 07/03/2020   NA 143 07/03/2020   K 4.6 07/03/2020   CL 102 07/03/2020   CREATININE 0.77 07/03/2020   BUN 19 07/03/2020   CO2 22 07/03/2020   HGBA1C 8.2 (H) 07/03/2020   MICROALBUR 10 07/03/2020      Assessment & Plan:   1. Decubitus ulcer of back, stage 2 ([place]) The decubitus ulcer is totally healed, all erythema reolved  2. Pressure injury of buttock, stage 2, unspecified laterality ([place]) The decubitus are healed          I spent 15 minutes dedicated to the care of this patient on the date of this encounter to include face-to-face time with the patient, as well as:   Follow-up: Return in about 2 months (around 10/02/2020) for fasting.  An After Visit Summary was printed and given to the patient.  Reinaldo Meeker, MD [facility] [phone]

## 2020-08-08 ENCOUNTER — Other Ambulatory Visit: Payer: Self-pay

## 2020-08-08 ENCOUNTER — Other Ambulatory Visit: Payer: Self-pay | Admitting: Legal Medicine

## 2020-08-08 MED ORDER — TRESIBA FLEXTOUCH 100 UNIT/ML ~~LOC~~ SOPN: 26.0000 [IU] | PEN_INJECTOR | Freq: Every day | SUBCUTANEOUS | 5 refills | Status: DC

## 2020-08-08 NOTE — Telephone Encounter (Signed)
What monitor does your insurance cover? lp

## 2020-08-08 NOTE — Telephone Encounter (Signed)
Patient is also requesting a new glucose monitor, the one she currently has will not read her glucose. The type she has is a Research scientist (life sciences).

## 2020-08-14 ENCOUNTER — Other Ambulatory Visit: Payer: Self-pay

## 2020-08-14 ENCOUNTER — Encounter: Payer: Self-pay | Admitting: Legal Medicine

## 2020-08-14 ENCOUNTER — Ambulatory Visit (INDEPENDENT_AMBULATORY_CARE_PROVIDER_SITE_OTHER): Payer: Medicare HMO | Admitting: Legal Medicine

## 2020-08-14 VITALS — BP 136/80 | HR 77 | Temp 97.6°F | Resp 16 | Ht 65.0 in | Wt 245.0 lb

## 2020-08-14 DIAGNOSIS — L89102 Pressure ulcer of unspecified part of back, stage 2: Secondary | ICD-10-CM | POA: Diagnosis not present

## 2020-08-14 DIAGNOSIS — L89302 Pressure ulcer of unspecified buttock, stage 2: Secondary | ICD-10-CM

## 2020-08-14 NOTE — Progress Notes (Signed)
Subjective:  Patient ID: Alicia Kelly, female    DOB: 20-Jan-1956  Age: 65 y.o. MRN: [id number]  Chief Complaint  Patient presents with  . Wound Check    HPI: follow up decubitus ulcer on buttocks.  It was healing well.    Current Outpatient Medications on File Prior to Visit  Medication Sig Dispense Refill  . Accu-Chek Softclix Lancets lancets     . atorvastatin (LIPITOR) 40 MG tablet Take 40 mg by mouth daily.     . Blood Glucose Calibration (ACCU-CHEK AVIVA) SOLN     . clotrimazole-betamethasone (LOTRISONE) cream Apply 1 application topically 2 (two) times daily. 30 g 3  . glipiZIDE (GLUCOTROL) 5 MG tablet Take 5 mg by mouth daily before breakfast.     . glucose blood test strip 1 each by Other route 3 (three) times daily. Use as instructed 100 each 1  . Insulin Pen Needle (PEN NEEDLES) 31G X 5 MM MISC 1 each by Does not apply route daily. 100 each 2  . lacosamide (VIMPAT) 200 MG TABS tablet Take by mouth.    . levETIRAcetam (KEPPRA) 1000 MG tablet Take 1 tablet by mouth 3 (three) times daily.    Marland Kitchen losartan-hydrochlorothiazide (HYZAAR) 100-25 MG tablet Take 1 tablet by mouth daily.     . montelukast (SINGULAIR) 10 MG tablet TAKE ONE TABLET BY MOUTH DAILY AS DIRECTED 30 tablet 2  . pantoprazole (PROTONIX) 40 MG tablet     . TRESIBA FLEXTOUCH 100 UNIT/ML FlexTouch Pen Inject 26 Units into the skin daily. 15 mL 5   No current facility-administered medications on file prior to visit.   Past Medical History:  Diagnosis Date  . Asthma   . Calculus of gallbladder with chronic cholecystitis without obstruction 03/22/2017  . Diabetes ([place])   . Epilepsy ([place])   . Hypertension   . Sleep apnea    Past Surgical History:  Procedure Laterality Date  . APPENDECTOMY    . TONSILLECTOMY    . TONSILLECTOMY      Family History  Problem Relation Age of Onset  . Colon cancer Mother   . Hypertension Father   . Diabetes Father   . Allergic rhinitis Sister   . Hypertension Sister   .  Cancer Maternal Uncle   . Cancer Maternal Grandmother   . Cancer Paternal Grandmother   . Diabetes Paternal Grandmother    Social History   Socioeconomic History  . Marital status: Married    Spouse name: Not on file  . Number of children: Not on file  . Years of education: Not on file  . Highest education level: Not on file  Occupational History  . Not on file  Tobacco Use  . Smoking status: Never Smoker  . Smokeless tobacco: Never Used  Vaping Use  . Vaping Use: Never used  Substance and Sexual Activity  . Alcohol use: Not Currently  . Drug use: Never  . Sexual activity: Not on file  Other Topics Concern  . Not on file  Social History Narrative  . Not on file   Social Determinants of Health   Financial Resource Strain: Not on file  Food Insecurity: Not on file  Transportation Needs: Not on file  Physical Activity: Not on file  Stress: Not on file  Social Connections: Not on file    Review of Systems  Constitutional: Negative for activity change and appetite change.  Eyes: Negative for visual disturbance.  Respiratory: Negative for chest tightness and shortness of breath.  Cardiovascular: Negative for chest pain and palpitations.  Gastrointestinal: Negative for abdominal distention and abdominal pain.  Endocrine: Negative for polyuria.  Genitourinary: Negative for difficulty urinating.  Musculoskeletal: Negative for arthralgias and back pain.  Skin: Positive for wound (healing).  Psychiatric/Behavioral: Negative.      Objective:  BP 136/80   Pulse 77   Temp 97.6 F (36.4 C)   Resp 16   Ht 5\' 5"  (1.651 m)   Wt 245 lb (111.1 kg)   SpO2 98%   BMI 40.77 kg/m   BP/Weight 08/14/2020 08/02/2020 5/64/3329  Systolic BP 518 841 660  Diastolic BP 80 76 80  Wt. (Lbs) 245 247 247  BMI 40.77 41.1 41.1    Physical Exam Vitals reviewed.  Constitutional:      Appearance: Normal appearance.  Eyes:     Extraocular Movements: Extraocular movements intact.      Conjunctiva/sclera: Conjunctivae normal.     Pupils: Pupils are equal, round, and reactive to light.  Cardiovascular:     Rate and Rhythm: Normal rate and regular rhythm.     Pulses: Normal pulses.     Heart sounds: No murmur heard. No gallop.   Pulmonary:     Effort: Pulmonary effort is normal. No respiratory distress.     Breath sounds: Normal breath sounds. No rales.  Skin:    General: Skin is warm.     Comments: #2 shallow ulcers on buttocks, redressed  Neurological:     Mental Status: She is alert.       Lab Results  Component Value Date   WBC 10.1 07/03/2020   HGB 11.1 07/03/2020   HCT 34.7 07/03/2020   PLT 476 (H) 07/03/2020   GLUCOSE 200 (H) 07/03/2020   CHOL 134 07/03/2020   TRIG 118 07/03/2020   HDL 56 07/03/2020   LDLCALC 57 07/03/2020   ALT 17 07/03/2020   AST 15 07/03/2020   NA 143 07/03/2020   K 4.6 07/03/2020   CL 102 07/03/2020   CREATININE 0.77 07/03/2020   BUN 19 07/03/2020   CO2 22 07/03/2020   HGBA1C 8.2 (H) 07/03/2020   MICROALBUR 10 07/03/2020      Assessment & Plan:   1. Decubitus ulcer of back, stage 2 ([place]) The decubitus is healing well with minimal breakdown, it is more erythematous so we will recover and see for 1 week. 2. Pressure injury of buttock, stage 2, unspecified laterality ([place])  We are looking for the bandages that patient used.  Found on [place]        I spent 15 minutes dedicated to the care of this patient on the date of this encounter to include face-to-face time with the patient, as well as:   Follow-up: Return in about 1 week (around 08/21/2020) for ulcer.  An After Visit Summary was printed and given to the patient.  Reinaldo Meeker, MD [facility] [phone]

## 2020-08-16 NOTE — Progress Notes (Signed)
Chronic Care Management Pharmacy Note  08/22/2020 Name:  Alicia Kelly MRN:  [id number] DOB:  06-01-1955   Plan Recommendations:   Recommend patient get Shingrix shot.   Discussed benefit of improved blood sugar control. Patient plans to begin walking [place]. Will revisit adjusting medication after next a1c result. Discussed option of once weekly or oral daily GLP-1 for improved blood sugar control and reduce insulin use.   Subjective: Alicia Kelly is an 65 y.o. year old female who is a primary patient of Henrene Pastor, Zeb Comfort, MD.  The CCM team was consulted for assistance with disease management and care coordination needs.    Engaged with patient by telephone for initial visit in response to provider referral for pharmacy case management and/or care coordination services.   Consent to Services:  The patient was given the following information about Chronic Care Management services today, agreed to services, and gave verbal consent: 1. CCM service includes personalized support from designated clinical staff supervised by the primary care provider, including individualized plan of care and coordination with other care providers 2. 24/7 contact phone numbers for assistance for urgent and routine care needs. 3. Service will only be billed when office clinical staff spend 20 minutes or more in a month to coordinate care. 4. Only one practitioner may furnish and bill the service in a calendar month. 5.The patient may stop CCM services at any time (effective at the end of the month) by phone call to the office staff. 6. The patient will be responsible for cost sharing (co-pay) of up to 20% of the service fee (after annual deductible is met). Patient agreed to services and consent obtained.  Patient Care Team: Lillard Anes, MD as PCP - General (Family Medicine) Burnice Logan, [facility] as Pharmacist (Pharmacist)  Recent office visits: 08/14/2020 - ulcer  healing well. Reocever and check in 1 week.  08/02/2020 - wound healed.  07/26/2020 - wound care. Lotrisone prescribed.  07/18/2020 - wound care performed.  07/11/2020 - wound care performed.  07/08/2020 - decubitus ulcer of back stage 2 - wound care performed.  07/03/2020 - Platelet high probably from ulcer, glucose 200, kidney tests normal, liver tests normal, A1c 8.2, cholesterol normal, increase  insulin to 28 units a day. Positive sleep study but unable to follow-up for titration.  Recent consult visits: 06/26/2020 - [place] - stage 3 pressure ulcer. D/C peroxide. Wash aera with soap and water. Follow-up in 2 weeks. Discussed pressure relief and offloading.  03/01/2020 - neurology - continue same meds. Patient declines additional seizure medication.  Hospital visits: None in previous 6 months  Objective:  Lab Results  Component Value Date   CREATININE 0.77 07/03/2020   BUN 19 07/03/2020   GFRNONAA 82 07/03/2020   GFRAA 95 07/03/2020   NA 143 07/03/2020   K 4.6 07/03/2020   CALCIUM 10.0 07/03/2020   CO2 22 07/03/2020   GLUCOSE 200 (H) 07/03/2020    Lab Results  Component Value Date/Time   HGBA1C 8.2 (H) 07/03/2020 11:07 AM   MICROALBUR 10 07/03/2020 10:25 AM    Last diabetic Eye exam: No results found for: HMDIABEYEEXA  Last diabetic Foot exam: No results found for: HMDIABFOOTEX   Lab Results  Component Value Date   CHOL 134 07/03/2020   HDL 56 07/03/2020   LDLCALC 57 07/03/2020   TRIG 118 07/03/2020   CHOLHDL 2.4 07/03/2020    Hepatic Function Latest Ref Rng & Units 07/03/2020  Total  Protein 6.0 - 8.5 g/dL 6.7  Albumin 3.8 - 4.8 g/dL 4.3  AST 0 - 40 IU/L 15  ALT 0 - 32 IU/L 17  Alk Phosphatase 44 - 121 IU/L 74  Total Bilirubin 0.0 - 1.2 mg/dL 0.4    No results found for: TSH, FREET4  CBC Latest Ref Rng & Units 07/03/2020 05/27/2018  WBC 3.4 - 10.8 x10E3/uL 10.1 11.2(H)  Hemoglobin 11.1 - 15.9 g/dL 11.1 11.9  Hematocrit 34.0 - 46.6 % 34.7 36.0   Platelets 150 - 450 x10E3/uL 476(H) 484(H)    No results found for: VD25OH  Clinical ASCVD: No  The 10-year ASCVD risk score Mikey Bussing DC Jr., et al., 2013) is: 9.9%   Values used to calculate the score:     Age: 3 years     Sex: Female     Is Non-Hispanic African American: No     Diabetic: Yes     Tobacco smoker: No     Systolic Blood Pressure: 502 mmHg     Is BP treated: Yes     HDL Cholesterol: 56 mg/dL     Total Cholesterol: 134 mg/dL    Depression screen [facility] 2/9 07/03/2020  Decreased Interest 0  Down, Depressed, Hopeless 0  PHQ - 2 Score 0      Social History   Tobacco Use  Smoking Status Never Smoker  Smokeless Tobacco Never Used   BP Readings from Last 3 Encounters:  08/14/20 136/80  08/02/20 124/76  07/26/20 136/80   Pulse Readings from Last 3 Encounters:  08/14/20 77  08/02/20 73  07/26/20 72   Wt Readings from Last 3 Encounters:  08/14/20 245 lb (111.1 kg)  08/02/20 247 lb (112 kg)  07/26/20 247 lb (112 kg)   BMI Readings from Last 3 Encounters:  08/14/20 40.77 kg/m  08/02/20 41.10 kg/m  07/26/20 41.10 kg/m    Assessment/Interventions: Review of patient past medical history, allergies, medications, health status, including review of consultants reports, laboratory and other test data, was performed as part of comprehensive evaluation and provision of chronic care management services.   SDOH:  (Social Determinants of Health) assessments and interventions performed: Yes  SDOH Screenings   Alcohol Screen: Not on file  Depression (PHQ2-9): Low Risk   . PHQ-2 Score: 0  Financial Resource Strain: Not on file  Food Insecurity: No Food Insecurity  . Worried About Charity fundraiser in the Last Year: Never true  . Ran Out of Food in the Last Year: Never true  Housing: Low Risk   . Last Housing Risk Score: 0  Physical Activity: Not on file  Social Connections: Not on file  Stress: Not on file  Tobacco Use: Low Risk   . Smoking Tobacco Use:  Never Smoker  . Smokeless Tobacco Use: Never Used  Transportation Needs: No Transportation Needs  . Lack of Transportation (Medical): No  . Lack of Transportation (Non-Medical): No    CCM Care Plan  Allergies  Allergen Reactions  . Levofloxacin Other (See Comments)    Throat swelling  . Lisinopril Hives  . Nitrofurantoin Swelling    Throat swelling  . Penicillins Anaphylaxis  . Clarithromycin Other (See Comments)    seizures  . Sulfamethoxazole-Trimethoprim     Causes seizures    Medications Reviewed Today    Reviewed by Burnice Logan, [facility] (Pharmacist) on 08/22/20 at 0907  Med List Status: <None>  Medication Order Taking? Sig Documenting Provider Last Dose Status Informant  Accu-Chek Softclix Lancets lancets  [id number] Yes  [provider] Taking Active   atorvastatin (LIPITOR) 40 MG tablet [id number] Yes Take 40 mg by mouth daily.  [provider] Taking Active   Blood Glucose Calibration ([place]) SOLN [id number] Yes  [provider] Taking Active   clotrimazole-betamethasone (LOTRISONE) cream [id number] Yes Apply 1 application topically 2 (two) times daily. Lillard Anes, MD Taking Active   glipiZIDE (GLUCOTROL) 5 MG tablet [id number] Yes Take 5 mg by mouth 2 (two) times daily. [provider] Taking Active   glucose blood test strip [id number] Yes 1 each by Other route 3 (three) times daily. Use as instructed Lillard Anes, MD Taking Active   Insulin Pen Needle (PEN NEEDLES) 31G X 5 MM MISC [id number] Yes 1 each by Does not apply route daily. Lillard Anes, MD Taking Active   lacosamide (VIMPAT) 200 MG TABS tablet [id number] Yes Take 300 mg by mouth 2 (two) times daily. [provider] Taking Active   levETIRAcetam (KEPPRA) 1000 MG tablet [id number] Yes Take 1,000 mg by mouth in the morning, at noon, and at bedtime. [provider] Taking Active   losartan-hydrochlorothiazide (HYZAAR) 100-25 MG  tablet [id number] Yes Take 1 tablet by mouth daily.  [provider] Taking Active   montelukast (SINGULAIR) 10 MG tablet [id number] Yes TAKE ONE TABLET BY MOUTH DAILY AS DIRECTED Kozlow, Donnamarie Poag, MD Taking Active   pantoprazole (PROTONIX) 40 MG tablet [id number] Yes Take 40 mg by mouth 2 (two) times daily. [provider] Taking Active   TRESIBA FLEXTOUCH 100 UNIT/ML FlexTouch Pen [id number] Yes Inject 26 Units into the skin daily.  Patient taking differently: Inject 28 Units into the skin daily.   Lillard Anes, MD Taking Active           Patient Active Problem List   Diagnosis Date Noted  . Obesity, diabetes, and hypertension syndrome ([place]) 07/03/2020  . Decubitus ulcer of back, stage 2 ([place]) 07/03/2020  . Complex partial seizures ([place]) 04/09/2020  . Essential hypertension 08/01/2018  . Hyperlipidemia 08/01/2018  . Coronary artery calcification seen on CT scan 08/01/2018  . Postmenopause 04/30/2017  . Lumbosacral radiculopathy at L4 01/07/2017  . GERD (gastroesophageal reflux disease) 11/29/2016  . Sleep apnea 11/29/2016  . Sinus bradycardia 02/13/2016  . Type 2 diabetes mellitus without complication, without long-term current use of insulin ([place]) 01/06/2016  . Generalized epilepsy ([place]) 01/05/2013  . Partial epilepsy with impairment of consciousness, with intractable epilepsy ([place]) 01/05/2013    Immunization History  Administered Date(s) Administered  . Moderna Sars-Covid-2 Vaccination 09/15/2019, 10/13/2019, 05/15/2020  . Tdap 04/04/2014    Conditions to be addressed/monitored:  Hypertension, Hyperlipidemia, Diabetes, Coronary Artery Disease, GERD and Epilepsy.   Care Plan : Pharmacy Care Plan  Updates made by Burnice Logan, RPH since 08/22/2020 12:00 AM    Problem: htn, hld, dm, gerd   Priority: High  Onset Date: 08/22/2020    Long-Range Goal: Disease Management   Start Date: 08/22/2020  Expected End Date: 08/22/2021  This Visit's Progress: On  track  Priority: High  Note:    Current Barriers:  . Unable to achieve control of blood sugar   Pharmacist Clinical Goal(s):  Marland Kitchen Patient will achieve control of diabetes as evidenced by a1c through collaboration with PharmD and provider.   Interventions: . 1:1 collaboration with Lillard Anes, MD regarding development and update of comprehensive plan of care as evidenced by provider attestation and co-signature . Inter-disciplinary care team collaboration (see longitudinal plan  of care) . Comprehensive medication review performed; medication list updated in electronic medical record  Hypertension (BP goal <130/80) -Controlled -Current treatment: . losartan-hydrochlorothizide 100-25 mg daily  -Medications previously tried: lisinopril, amlodipine, irbesartan-hctz -Current home readings: goes up with hot flash per patient. Checks it at home and is "good". ~120s/86 -Current dietary habits: states little appetite -Current exercise habits: walks and shops but no formal exercise -Denies hypotensive/hypertensive symptoms -Educated on BP goals and benefits of medications for prevention of heart attack, stroke and kidney damage; Daily salt intake goal < 2300 mg; Exercise goal of 150 minutes per week; Importance of home blood pressure monitoring; -Counseled to monitor BP at home weekly, document, and provide log at future appointments -Counseled on diet and exercise extensively Recommended to continue current medication  Hyperlipidemia: (LDL goal < 55) -Controlled -Current treatment: . atorvastatin 40 mg daily  -Medications previously tried: none reported  -Current dietary patterns: reports little appetite -Current exercise habits: no formal exercise but encouraged patient to begin walking her loop with her neighbor -Educated on Cholesterol goals;  Benefits of statin for ASCVD risk reduction; Importance of limiting foods high in cholesterol; Exercise goal of 150 minutes per  week; -Counseled on diet and exercise extensively Recommended to continue current medication  Diabetes (A1c goal <7%) -Uncontrolled -Current medications: . accu-chek softclix . accu-chek aviva  . Glipizide 5 mg twice daily  . Glucose blood test strips three times daily  . Pen needles daily . tresiba flextouch 28 units daily -Medications previously tried: metformin,  -Current home glucose readings . fasting glucose: 168, 202 . post prandial glucose: not reported -Denies hypoglycemic/hyperglycemic symptoms -Current meal patterns: Doesn't eat bread or sandwiches. Less appetite lately.  . breakfast: eggs, eats 1 banana a day . lunch:  sandwich and banana . dinner:  hamburger, cream potatoes and gravy . snacks:  limited . drinks:  water, tea,  -Current exercise:  walks, shops, enjoys dancing but hasn't been since COVID -Educated on A1c and blood sugar goals; Complications of diabetes including kidney damage, retinal damage, and cardiovascular disease; Exercise goal of 150 minutes per week; Benefits of routine self-monitoring of blood sugar; Carbohydrate counting and/or plate method -Counseled to check feet daily and get yearly eye exams -Counseled on diet and exercise extensively Recommended to continue current medication Recommended patient begin walking 30 minutes with her neighbors to control blood sugar.  Educated on the importance of a1c <7% and options to help get it down. Patietn would like to try walking until next bloodwork before considering a medication change.   Epilepsy (Goal: seizure control) -Controlled -Current treatment  . Vimpat 200 mg - 1.5 tablets twice daily  . Keppra 1000 mg tid -Medications previously tried: dilantin  -Recommended to continue current medication  GERD (Goal: manage symptoms and prevent recurrence of stomach bleed) -Controlled -Current treatment  . Pantoprazole 40 mg bid -Medications previously tried:  omeprazole, Zantac -Counseled on  diet and exercise extensively Recommended to continue current medication Recommended patient follow-up with GI if symptoms reoccur.   Asthma (Goal: manage symptoms and avoid falres) -Controlled -Current treatment  . montelukast 10 mg daily as directed  -Medications previously tried: none reported  -Recommended to continue current medication  Health Maintenance -Vaccine gaps: TDAP, Shingrix, Patient reports annual flu and both pneumonia shots in the past.  -Current therapy:  . clotrimazole-betamethasone bid  -Educated on benefit of Shingrix shot and recommended patient go to pharmacy to get.  -Patient is satisfied with current therapy and denies issues -Recommended to  continue current medication   Patient Goals/Self-Care Activities . Patient will:  - take medications as prescribed focus on medication adherence by continuing to use daily pill box check glucose twice daily, document, and provide at future appointments target a minimum of 150 minutes of moderate intensity exercise weekly engage in dietary modifications by limiting carbohydrates/sugars and balanced meals  Follow Up Plan: Telephone follow up appointment with care management team member scheduled for:  10/23/2020      Medication Assistance: None required.  Patient affirms current coverage meets needs.  Patient's preferred pharmacy is:  [facility], [place] [address] Phone: [phone] Fax: [facility] [place], [place] [address] Phone: [phone] Fax: [phone]  Uses pill box? No - uses small daily pill container but not weekly planner.  Pt endorses good compliance  We discussed: Benefits of medication synchronization, packaging and delivery as well as enhanced pharmacist oversight with [employer]. Patient decided to: Continue current medication management  strategy  Care Plan and Follow Up Patient Decision:  Patient agrees to Care Plan and Follow-up.  Plan: Telephone follow up appointment with care management team member scheduled for:  10/23/2020

## 2020-08-21 ENCOUNTER — Telehealth: Payer: Self-pay

## 2020-08-21 NOTE — Progress Notes (Signed)
Chronic Care Management Pharmacy Assistant   Name: Alicia Kelly  MRN: [id number] DOB: [birth year]   Reason for Encounter : Initial call Questions   Recent office visits:  08/14/2020: Dr. Zeb Comfort Perry/ (PCP) 08/02/2020: Dr. Zeb Comfort Perry/ (PCP 07/26/2020: Dr. Zeb Comfort Perry/ (PCP) 07/18/2020: Dr. Zeb Comfort Perry/ (PCP) 07/11/2020: Dr. Zeb Comfort Perry/ (PCP) 07/08/2020: Dr. Zeb Comfort Perry/ (PCP) 07/03/2020:  Dr. Zeb Comfort Perry/ (PCP)   Recent consult visits:  06/26/2020: Dr, Marcie Bal. Argenta/ (Wound Care)  03/01/2020: Dr. Haze Rushing Feraru/ (Neurology)  Hospital visits:  None in previous 6 months  Medications: Outpatient Encounter Medications as of 08/21/2020  Medication Sig  . Accu-Chek Softclix Lancets lancets   . atorvastatin (LIPITOR) 40 MG tablet Take 40 mg by mouth daily.   . Blood Glucose Calibration (ACCU-CHEK AVIVA) SOLN   . clotrimazole-betamethasone (LOTRISONE) cream Apply 1 application topically 2 (two) times daily.  Marland Kitchen glipiZIDE (GLUCOTROL) 5 MG tablet Take 5 mg by mouth daily before breakfast.   . glucose blood test strip 1 each by Other route 3 (three) times daily. Use as instructed  . Insulin Pen Needle (PEN NEEDLES) 31G X 5 MM MISC 1 each by Does not apply route daily.  Marland Kitchen lacosamide (VIMPAT) 200 MG TABS tablet Take by mouth.  . levETIRAcetam (KEPPRA) 1000 MG tablet Take 1 tablet by mouth 3 (three) times daily.  Marland Kitchen losartan-hydrochlorothiazide (HYZAAR) 100-25 MG tablet Take 1 tablet by mouth daily.   . montelukast (SINGULAIR) 10 MG tablet TAKE ONE TABLET BY MOUTH DAILY AS DIRECTED  . pantoprazole (PROTONIX) 40 MG tablet   . TRESIBA FLEXTOUCH 100 UNIT/ML FlexTouch Pen Inject 26 Units into the skin daily.   No facility-administered encounter medications on file as of 08/21/2020.   Have you seen any other providers since your last visit?  Patient stated that she has not seen any other providers since her last  visit.  Any changes in your medications or health?  Patient stated that she has had no medication or health changes.  Any side effects from any medications?  Patient stated that she has no side effects from her medications.  Do you have an symptoms or problems not managed by your medications?  Patient stated that she is not experiencing any symptoms or problems not managed by her medications.  Any concerns about your health right now? Patient stated that she has no health concerns right now.  Has your provider asked that you check blood pressure, blood sugar, or follow special diet?    Patient stated she has been checking her blood pressure at least one time a week. She reported that her blood pressure was 168/88 yesterday morning, and 163/88 last night. I encouraged her to check her blood pressure again tonight and in the morning and share her results with Sherre Poot, CPP tomorrow during her appointment. Patient reported that she checks her sugars daily. Her readings in the morning before breakfast average 167. She stated that the numbers go down as the day goes on. She checks them before lunch and they range around 132 or 133 on average. At night, before bedtime her numbers range between 123-136.   Do you get any type of exercise on a regular basis? N/A  Can you think of a goal you would like to reach for your health?  Patient stated that she can not think of any particular goals that she would like to reach at this time.  Do you have any problems  getting your medications?  Patient reported that she needs a new Aquacheck, because her device is broken. She stated that she has had difficulty getting  [employer] to approve her for a new device.  Is there anything that you would like to discuss during the appointment?  Patient stated that she has nothing specifically that she would like to discuss during her appointment tomorrow.   Please bring medications and supplements to  appointment  Gaston Islam, [place] Pharmacist Assistant [phone]

## 2020-08-22 ENCOUNTER — Other Ambulatory Visit: Payer: Self-pay

## 2020-08-22 ENCOUNTER — Encounter: Payer: Self-pay | Admitting: Legal Medicine

## 2020-08-22 ENCOUNTER — Ambulatory Visit (INDEPENDENT_AMBULATORY_CARE_PROVIDER_SITE_OTHER): Payer: Medicare HMO

## 2020-08-22 DIAGNOSIS — E782 Mixed hyperlipidemia: Secondary | ICD-10-CM

## 2020-08-22 DIAGNOSIS — I10 Essential (primary) hypertension: Secondary | ICD-10-CM

## 2020-08-22 DIAGNOSIS — E119 Type 2 diabetes mellitus without complications: Secondary | ICD-10-CM

## 2020-08-22 NOTE — Patient Instructions (Signed)
Visit Information  Thank you for your time discussing your medications. I look forward to working with you to achieve your health care goals. Below is a summary of what we talked about during our visit.   Goals Addressed            This Visit's Progress   . Learn More About My Health       Timeframe:  Long-Range Goal Priority:  High Start Date:                             Expected End Date:                        Follow Up Date 10/23/2020    - tell my story and reason for my visit - repeat what I heard to make sure I understand - bring a list of my medicines to the visit    Why is this important?    The best way to learn about your health and care is by talking to the doctor and nurse.   They will answer your questions and give you information in the way that you like best.    Notes:     . Lifestyle Change-Hypertension       Timeframe:  Long-Range Goal Priority:  High Start Date:                             Expected End Date:                       Follow Up Date 10/23/2020    - agree to work together to make changes - ask questions to understand - learn about high blood pressure    Why is this important?    The changes that you are asked to make may be hard to do.   This is especially true when the changes are life-long.   Knowing why it is important to you is the first step.   Working on the change with your family or support person helps you not feel alone.   Reward yourself and family or support person when goals are met. This can be an activity you choose like bowling, hiking, biking, swimming or shooting hoops.     Notes:     Marland Kitchen Monitor and Manage My Blood Sugar-Diabetes Type 2       Timeframe:  Long-Range Goal Priority:  High Start Date:                             Expected End Date:                       Follow Up Date 10/23/2020    - check blood sugar at prescribed times - take the blood sugar log to all doctor visits    Why is this  important?    Checking your blood sugar at home helps to keep it from getting very high or very low.   Writing the results in a diary or log helps the doctor know how to care for you.   Your blood sugar log should have the time, date and the results.   Also, write down the amount of insulin or other medicine that you take.   Other information,  like what you ate, exercise done and how you were feeling, will also be helpful.     Notes:        Patient Care Plan: Pharmacy Care Plan    Problem Identified: htn, hld, dm, gerd   Priority: High  Onset Date: 08/22/2020    Long-Range Goal: Disease Management   Start Date: 08/22/2020  Expected End Date: 08/22/2021  This Visit's Progress: On track  Priority: High  Note:    Current Barriers:  . Unable to achieve control of blood sugar   Pharmacist Clinical Goal(s):  Marland Kitchen Patient will achieve control of diabetes as evidenced by a1c through collaboration with PharmD and provider.   Interventions: . 1:1 collaboration with Lillard Anes, MD regarding development and update of comprehensive plan of care as evidenced by provider attestation and co-signature . Inter-disciplinary care team collaboration (see longitudinal plan of care) . Comprehensive medication review performed; medication list updated in electronic medical record  Hypertension (BP goal <130/80) -Controlled -Current treatment: . losartan-hydrochlorothizide 100-25 mg daily  -Medications previously tried: lisinopril, amlodipine, irbesartan-hctz -Current home readings: goes up with hot flash per patient. Checks it at home and is "good". ~120s/86 -Current dietary habits: states little appetite -Current exercise habits: walks and shops but no formal exercise -Denies hypotensive/hypertensive symptoms -Educated on BP goals and benefits of medications for prevention of heart attack, stroke and kidney damage; Daily salt intake goal < 2300 mg; Exercise goal of 150 minutes per  week; Importance of home blood pressure monitoring; -Counseled to monitor BP at home weekly, document, and provide log at future appointments -Counseled on diet and exercise extensively Recommended to continue current medication  Hyperlipidemia: (LDL goal < 55) -Controlled -Current treatment: . atorvastatin 40 mg daily  -Medications previously tried: none reported  -Current dietary patterns: reports little appetite -Current exercise habits: no formal exercise but encouraged patient to begin walking her loop with her neighbor -Educated on Cholesterol goals;  Benefits of statin for ASCVD risk reduction; Importance of limiting foods high in cholesterol; Exercise goal of 150 minutes per week; -Counseled on diet and exercise extensively Recommended to continue current medication  Diabetes (A1c goal <7%) -Uncontrolled -Current medications: . accu-chek softclix . accu-chek aviva  . Glipizide 5 mg twice daily  . Glucose blood test strips three times daily  . Pen needles daily . tresiba flextouch 28 units daily -Medications previously tried: metformin,  -Current home glucose readings . fasting glucose: 168, 202 . post prandial glucose: not reported -Denies hypoglycemic/hyperglycemic symptoms -Current meal patterns: Doesn't eat bread or sandwiches. Less appetite lately.  . breakfast: eggs, eats 1 banana a day . lunch:  sandwich and banana . dinner:  hamburger, cream potatoes and gravy . snacks:  limited . drinks:  water, tea,  -Current exercise:  walks, shops, enjoys dancing but hasn't been since COVID -Educated on A1c and blood sugar goals; Complications of diabetes including kidney damage, retinal damage, and cardiovascular disease; Exercise goal of 150 minutes per week; Benefits of routine self-monitoring of blood sugar; Carbohydrate counting and/or plate method -Counseled to check feet daily and get yearly eye exams -Counseled on diet and exercise extensively Recommended to  continue current medication Recommended patient begin walking 30 minutes with her neighbors to control blood sugar.  Educated on the importance of a1c <7% and options to help get it down. Patietn would like to try walking until next bloodwork before considering a medication change.   Epilepsy (Goal: seizure control) -Controlled -Current treatment  . Vimpat 200 mg -  1.5 tablets twice daily  . Keppra 1000 mg tid -Medications previously tried: dilantin  -Recommended to continue current medication  GERD (Goal: manage symptoms and prevent recurrence of stomach bleed) -Controlled -Current treatment  . Pantoprazole 40 mg bid -Medications previously tried:  omeprazole, Zantac -Counseled on diet and exercise extensively Recommended to continue current medication Recommended patient follow-up with GI if symptoms reoccur.   Asthma (Goal: manage symptoms and avoid falres) -Controlled -Current treatment  . montelukast 10 mg daily as directed  -Medications previously tried: none reported  -Recommended to continue current medication  Health Maintenance -Vaccine gaps: TDAP, Shingrix, Patient reports annual flu and both pneumonia shots in the past.  -Current therapy:  . clotrimazole-betamethasone bid  -Educated on benefit of Shingrix shot and recommended patient go to pharmacy to get.  -Patient is satisfied with current therapy and denies issues -Recommended to continue current medication   Patient Goals/Self-Care Activities . Patient will:  - take medications as prescribed focus on medication adherence by continuing to use daily pill box check glucose twice daily, document, and provide at future appointments target a minimum of 150 minutes of moderate intensity exercise weekly engage in dietary modifications by limiting carbohydrates/sugars and balanced meals  Follow Up Plan: Telephone follow up appointment with care management team member scheduled for:  10/23/2020      Ms. Scaduto  was given information about Chronic Care Management services today including:  1. CCM service includes personalized support from designated clinical staff supervised by her physician, including individualized plan of care and coordination with other care providers 2. 24/7 contact phone numbers for assistance for urgent and routine care needs. 3. Standard insurance, coinsurance, copays and deductibles apply for chronic care management only during months in which we provide at least 20 minutes of these services. Most insurances cover these services at 100%, however patients may be responsible for any copay, coinsurance and/or deductible if applicable. This service may help you avoid the need for more expensive face-to-face services. 4. Only one practitioner may furnish and bill the service in a calendar month. 5. The patient may stop CCM services at any time (effective at the end of the month) by phone call to the office staff.  Patient agreed to services and verbal consent obtained.   The patient verbalized understanding of instructions, educational materials, and care plan provided today and declined offer to receive copy of patient instructions, educational materials, and care plan.  Telephone follow up appointment with pharmacy team member scheduled for: 10/2020  Sherre Poot, PharmD Clinical Pharmacist [facility] [phone] (office) [phone] (mobile)  Diabetes Mellitus and Nutrition, Adult When you have diabetes, or diabetes mellitus, it is very important to have healthy eating habits because your blood sugar (glucose) levels are greatly affected by what you eat and drink. Eating healthy foods in the right amounts, at about the same times every day, can help you:  Control your blood glucose.  Lower your risk of heart disease.  Improve your blood pressure.  Reach or maintain a healthy weight. What can affect my meal plan? Every person with diabetes is different, and  each person has different needs for a meal plan. Your health care provider may recommend that you work with a dietitian to make a meal plan that is best for you. Your meal plan may vary depending on factors such as:  The calories you need.  The medicines you take.  Your weight.  Your blood glucose, blood pressure, and cholesterol levels.  Your  activity level.  Other health conditions you have, such as heart or kidney disease. How do carbohydrates affect me? Carbohydrates, also called carbs, affect your blood glucose level more than any other type of food. Eating carbs naturally raises the amount of glucose in your blood. Carb counting is a method for keeping track of how many carbs you eat. Counting carbs is important to keep your blood glucose at a healthy level, especially if you use insulin or take certain oral diabetes medicines. It is important to know how many carbs you can safely have in each meal. This is different for every person. Your dietitian can help you calculate how many carbs you should have at each meal and for each snack. How does alcohol affect me? Alcohol can cause a sudden decrease in blood glucose (hypoglycemia), especially if you use insulin or take certain oral diabetes medicines. Hypoglycemia can be a life-threatening condition. Symptoms of hypoglycemia, such as sleepiness, dizziness, and confusion, are similar to symptoms of having too much alcohol.  Do not drink alcohol if: ? Your health care provider tells you not to drink. ? You are pregnant, may be pregnant, or are planning to become pregnant.  If you drink alcohol: ? Do not drink on an empty stomach. ? Limit how much you use to:  0-1 drink a day for women.  0-2 drinks a day for men. ? Be aware of how much alcohol is in your drink. In the [place] one drink equals one 12 oz bottle of beer (355 mL), one 5 oz glass of wine (148 mL), or one 1 oz glass of hard liquor (44 mL). ? Keep yourself hydrated with  water, diet soda, or unsweetened iced tea.  Keep in mind that regular soda, juice, and other mixers may contain a lot of sugar and must be counted as carbs. What are tips for following this plan? Reading food labels  Start by checking the serving size on the "Nutrition Facts" label of packaged foods and drinks. The amount of calories, carbs, fats, and other nutrients listed on the label is based on one serving of the item. Many items contain more than one serving per package.  Check the total grams (g) of carbs in one serving. You can calculate the number of servings of carbs in one serving by dividing the total carbs by 15. For example, if a food has 30 g of total carbs per serving, it would be equal to 2 servings of carbs.  Check the number of grams (g) of saturated fats and trans fats in one serving. Choose foods that have a low amount or none of these fats.  Check the number of milligrams (mg) of salt (sodium) in one serving. Most people should limit total sodium intake to less than 2,300 mg per day.  Always check the nutrition information of foods labeled as "low-fat" or "nonfat." These foods may be higher in added sugar or refined carbs and should be avoided.  Talk to your dietitian to identify your daily goals for nutrients listed on the label. Shopping  Avoid buying canned, pre-made, or processed foods. These foods tend to be high in fat, sodium, and added sugar.  Shop around the outside edge of the grocery store. This is where you will most often find fresh fruits and vegetables, bulk grains, fresh meats, and fresh dairy. Cooking  Use low-heat cooking methods, such as baking, instead of high-heat cooking methods like deep frying.  Cook using healthy oils, such as olive, canola,  or sunflower oil.  Avoid cooking with butter, cream, or high-fat meats. Meal planning  Eat meals and snacks regularly, preferably at the same times every day. Avoid going long periods of time without  eating.  Eat foods that are high in fiber, such as fresh fruits, vegetables, beans, and whole grains. Talk with your dietitian about how many servings of carbs you can eat at each meal.  Eat 4-6 oz (112-168 g) of lean protein each day, such as lean meat, chicken, fish, eggs, or tofu. One ounce (oz) of lean protein is equal to: ? 1 oz (28 g) of meat, chicken, or fish. ? 1 egg. ?  cup (62 g) of tofu.  Eat some foods each day that contain healthy fats, such as avocado, nuts, seeds, and fish.   What foods should I eat? Fruits Berries. Apples. Oranges. Peaches. Apricots. Plums. Grapes. Mango. Papaya. Pomegranate. Kiwi. Cherries. Vegetables Lettuce. Spinach. Leafy greens, including kale, chard, collard greens, and mustard greens. Beets. Cauliflower. Cabbage. Broccoli. Carrots. Green beans. Tomatoes. Peppers. Onions. Cucumbers. Brussels sprouts. Grains Whole grains, such as whole-wheat or whole-grain bread, crackers, tortillas, cereal, and pasta. Unsweetened oatmeal. Quinoa. Roxanna Mcever or wild rice. Meats and other proteins Seafood. Poultry without skin. Lean cuts of poultry and beef. Tofu. Nuts. Seeds. Dairy Low-fat or fat-free dairy products such as milk, yogurt, and cheese. The items listed above may not be a complete list of foods and beverages you can eat. Contact a dietitian for more information. What foods should I avoid? Fruits Fruits canned with syrup. Vegetables Canned vegetables. Frozen vegetables with butter or cream sauce. Grains Refined white flour and flour products such as bread, pasta, snack foods, and cereals. Avoid all processed foods. Meats and other proteins Fatty cuts of meat. Poultry with skin. Breaded or fried meats. Processed meat. Avoid saturated fats. Dairy Full-fat yogurt, cheese, or milk. Beverages Sweetened drinks, such as soda or iced tea. The items listed above may not be a complete list of foods and beverages you should avoid. Contact a dietitian for more  information. Questions to ask a health care provider  Do I need to meet with a diabetes educator?  Do I need to meet with a dietitian?  What number can I call if I have questions?  When are the best times to check my blood glucose? Where to find more information:  American Diabetes Association: diabetes.org  Academy of Nutrition and Dietetics: www.eatright.[employer] of Diabetes and Digestive and Kidney Diseases: [employer]  Association of Diabetes Care and Education Specialists: www.diabeteseducator.org Summary  It is important to have healthy eating habits because your blood sugar (glucose) levels are greatly affected by what you eat and drink.  A healthy meal plan will help you control your blood glucose and maintain a healthy lifestyle.  Your health care provider may recommend that you work with a dietitian to make a meal plan that is best for you.  Keep in mind that carbohydrates (carbs) and alcohol have immediate effects on your blood glucose levels. It is important to count carbs and to use alcohol carefully. This information is not intended to replace advice given to you by your health care provider. Make sure you discuss any questions you have with your health care provider. Document Revised: 04/11/2019 Document Reviewed: 04/11/2019 Elsevier Patient Education  2021 [employer].

## 2020-09-05 ENCOUNTER — Ambulatory Visit (INDEPENDENT_AMBULATORY_CARE_PROVIDER_SITE_OTHER): Payer: Medicare HMO | Admitting: Legal Medicine

## 2020-09-05 ENCOUNTER — Encounter: Payer: Self-pay | Admitting: Legal Medicine

## 2020-09-05 ENCOUNTER — Telehealth: Payer: Self-pay

## 2020-09-05 ENCOUNTER — Other Ambulatory Visit: Payer: Self-pay

## 2020-09-05 VITALS — BP 140/80 | HR 80 | Temp 98.1°F | Resp 16 | Ht 65.0 in | Wt 245.0 lb

## 2020-09-05 DIAGNOSIS — M549 Dorsalgia, unspecified: Secondary | ICD-10-CM | POA: Insufficient documentation

## 2020-09-05 DIAGNOSIS — R300 Dysuria: Secondary | ICD-10-CM | POA: Diagnosis not present

## 2020-09-05 DIAGNOSIS — L89102 Pressure ulcer of unspecified part of back, stage 2: Secondary | ICD-10-CM

## 2020-09-05 DIAGNOSIS — M545 Low back pain, unspecified: Secondary | ICD-10-CM | POA: Diagnosis not present

## 2020-09-05 DIAGNOSIS — B3749 Other urogenital candidiasis: Secondary | ICD-10-CM

## 2020-09-05 DIAGNOSIS — G40309 Generalized idiopathic epilepsy and epileptic syndromes, not intractable, without status epilepticus: Secondary | ICD-10-CM | POA: Diagnosis not present

## 2020-09-05 LAB — POCT URINALYSIS DIP (CLINITEK)
Bilirubin, UA: NEGATIVE
Blood, UA: NEGATIVE
Glucose, UA: NEGATIVE mg/dL
Ketones, POC UA: NEGATIVE mg/dL
Nitrite, UA: NEGATIVE
POC PROTEIN,UA: NEGATIVE
Spec Grav, UA: 1.01 (ref 1.010–1.025)
Urobilinogen, UA: 0.2 E.U./dL
pH, UA: 6 (ref 5.0–8.0)

## 2020-09-05 MED ORDER — FLUCONAZOLE 100 MG PO TABS: 100.0000 mg | ORAL_TABLET | Freq: Every day | ORAL | 0 refills | Status: DC

## 2020-09-05 MED ORDER — CYCLOBENZAPRINE HCL 10 MG PO TABS: 10.0000 mg | ORAL_TABLET | Freq: Three times a day (TID) | ORAL | 0 refills | Status: DC | PRN

## 2020-09-05 NOTE — Progress Notes (Signed)
Subjective:  Patient ID: Alicia Kelly, female    DOB: Sep 16, 1955  Age: 65 y.o. MRN: [id number]  Chief Complaint  Patient presents with  . Seizures    Patient had a seizure on the bathroom on 08/30/2020. She hit a plastic drawers.  . Fall  . Urinary Tract Infection    HPI: patient had a seizure 4/15.2022 fel in bathroom and went to urgent care.  No fractures.  She is not taking pain medicine.  Back still sore and difficulty standing.  The perianal are is now red and scaling.  It had healed in past.  She needs dermatology consult.   Current Outpatient Medications on File Prior to Visit  Medication Sig Dispense Refill  . Accu-Chek Softclix Lancets lancets     . atorvastatin (LIPITOR) 40 MG tablet Take 40 mg by mouth daily.     . Blood Glucose Calibration (ACCU-CHEK AVIVA) SOLN     . clotrimazole-betamethasone (LOTRISONE) cream Apply 1 application topically 2 (two) times daily. 30 g 3  . glipiZIDE (GLUCOTROL) 5 MG tablet Take 5 mg by mouth 2 (two) times daily.    Marland Kitchen glucose blood test strip 1 each by Other route 3 (three) times daily. Use as instructed 100 each 1  . Insulin Pen Needle (PEN NEEDLES) 31G X 5 MM MISC 1 each by Does not apply route daily. 100 each 2  . lacosamide (VIMPAT) 200 MG TABS tablet Take 300 mg by mouth 2 (two) times daily.    Marland Kitchen levETIRAcetam (KEPPRA) 1000 MG tablet Take 1,000 mg by mouth in the morning, at noon, and at bedtime.    Marland Kitchen losartan-hydrochlorothiazide (HYZAAR) 100-25 MG tablet Take 1 tablet by mouth daily.     . montelukast (SINGULAIR) 10 MG tablet TAKE ONE TABLET BY MOUTH DAILY AS DIRECTED 30 tablet 2  . pantoprazole (PROTONIX) 40 MG tablet Take 40 mg by mouth 2 (two) times daily.    [name] FLEXTOUCH 100 UNIT/ML FlexTouch Pen Inject 26 Units into the skin daily. (Patient taking differently: Inject 28 Units into the skin daily.) 15 mL 5   No current facility-administered medications on file prior to visit.   Past Medical History:  Diagnosis Date  .  Asthma   . Calculus of gallbladder with chronic cholecystitis without obstruction 03/22/2017  . Diabetes ([place])   . Epilepsy ([place])   . Hypertension   . Sleep apnea    Past Surgical History:  Procedure Laterality Date  . APPENDECTOMY    . TONSILLECTOMY    . TONSILLECTOMY      Family History  Problem Relation Age of Onset  . Colon cancer Mother   . Hypertension Father   . Diabetes Father   . Allergic rhinitis Sister   . Hypertension Sister   . Cancer Maternal Uncle   . Cancer Maternal Grandmother   . Cancer Paternal Grandmother   . Diabetes Paternal Grandmother    Social History   Socioeconomic History  . Marital status: Married    Spouse name: Not on file  . Number of children: Not on file  . Years of education: Not on file  . Highest education level: Not on file  Occupational History  . Not on file  Tobacco Use  . Smoking status: Never Smoker  . Smokeless tobacco: Never Used  Vaping Use  . Vaping Use: Never used  Substance and Sexual Activity  . Alcohol use: Not Currently  . Drug use: Never  . Sexual activity: Not on file  Other  Topics Concern  . Not on file  Social History Narrative  . Not on file   Social Determinants of Health   Financial Resource Strain: Not on file  Food Insecurity: No Food Insecurity  . Worried About Charity fundraiser in the Last Year: Never true  . Ran Out of Food in the Last Year: Never true  Transportation Needs: No Transportation Needs  . Lack of Transportation (Medical): No  . Lack of Transportation (Non-Medical): No  Physical Activity: Not on file  Stress: Not on file  Social Connections: Not on file    Review of Systems  Constitutional: Negative for activity change and appetite change.  HENT: Negative for congestion and rhinorrhea.   Eyes: Negative for visual disturbance.  Respiratory: Negative for chest tightness and shortness of breath.   Cardiovascular: Negative for chest pain, palpitations and leg swelling.   Gastrointestinal: Negative for abdominal distention and abdominal pain.  Endocrine: Negative for polyuria.  Genitourinary: Positive for difficulty urinating.  Musculoskeletal: Positive for back pain.  Skin: Positive for rash (perianal).  Neurological: Negative.   Psychiatric/Behavioral: Negative.      Objective:  BP 140/80   Pulse 80   Temp 98.1 F (36.7 C)   Resp 16   Ht 5\' 5"  (1.651 m)   Wt 245 lb (111.1 kg)   SpO2 99%   BMI 40.77 kg/m   BP/Weight 09/05/2020 08/14/2020 08/24/1446  Systolic BP 185 631 497  Diastolic BP 80 80 76  Wt. (Lbs) 245 245 247  BMI 40.77 40.77 41.1    Physical Exam Vitals reviewed.  Constitutional:      Appearance: Normal appearance.  HENT:     Right Ear: Tympanic membrane normal.     Left Ear: Tympanic membrane normal.  Cardiovascular:     Rate and Rhythm: Normal rate and regular rhythm.     Pulses: Normal pulses.     Heart sounds: No murmur heard. No gallop.   Pulmonary:     Effort: Pulmonary effort is normal. No respiratory distress.     Breath sounds: Normal breath sounds. No rales.  Musculoskeletal:     Lumbar back: Tenderness present. Negative right straight leg raise test and negative left straight leg raise test.       Back:     Comments: Ecchymosis in buttocks  Skin:    General: Skin is warm and dry.     Comments: Large confluent area on redness and now scales in perirectal area.  It had healed in past.  Neurological:     General: No focal deficit present.     Mental Status: She is alert and oriented to person, place, and time.       Lab Results  Component Value Date   WBC 10.1 07/03/2020   HGB 11.1 07/03/2020   HCT 34.7 07/03/2020   PLT 476 (H) 07/03/2020   GLUCOSE 200 (H) 07/03/2020   CHOL 134 07/03/2020   TRIG 118 07/03/2020   HDL 56 07/03/2020   LDLCALC 57 07/03/2020   ALT 17 07/03/2020   AST 15 07/03/2020   NA 143 07/03/2020   K 4.6 07/03/2020   CL 102 07/03/2020   CREATININE 0.77 07/03/2020   BUN 19  07/03/2020   CO2 22 07/03/2020   HGBA1C 8.2 (H) 07/03/2020   MICROALBUR 10 07/03/2020      Assessment & Plan:   Meds ordered this encounter  Medications  . cyclobenzaprine (FLEXERIL) 10 MG tablet    Sig: Take 1 tablet (10  mg total) by mouth 3 (three) times daily as needed for muscle spasms.    Dispense:  30 tablet    Refill:  0  . fluconazole (DIFLUCAN) 100 MG tablet    Sig: Take 1 tablet (100 mg total) by mouth daily.    Dispense:  7 tablet    Refill:  0    Orders Placed This Encounter  Procedures  . Urine Culture  . POCT URINALYSIS DIP (CLINITEK)   Diagnoses and all orders for this visit: Dysuria -     POCT URINALYSIS DIP (CLINITEK) -     Urine Culture Trace leukocytes in urine  Generalized epilepsy ([place]) This is the first seizure in one year she remains on Keppra and plans to see her neurologist.  Acute bilateral low back pain without sciatica -     cyclobenzaprine (FLEXERIL) 10 MG tablet; Take 1 tablet (10 mg total) by mouth 3 (three) times daily as needed for muscle spasms. Patient is having low back and buttock pain from fall into cabinet in [place].  Decubitus ulcer of back, stage 2 ([place]) Her decubiti are still healing   Candida infection of genital region -     fluconazole (DIFLUCAN) 100 MG tablet; Take 1 tablet (100 mg total) by mouth daily. She has new confluent rash perirectal are that is now scaling.  It has healed in past. Concern for malignancy.   Follow-up: Return in about 2 weeks (around 09/19/2020).  An After Visit Summary was printed and given to the patient.  Reinaldo Meeker, MD [facility] [phone]

## 2020-09-05 NOTE — Telephone Encounter (Signed)
Patient has an appointment with [facility] on 09/10/2020 at 10:30 am. Patient was informed.

## 2020-09-07 LAB — URINE CULTURE

## 2020-09-08 NOTE — Progress Notes (Signed)
Urine culture is negative lp

## 2020-09-11 ENCOUNTER — Ambulatory Visit (INDEPENDENT_AMBULATORY_CARE_PROVIDER_SITE_OTHER): Payer: Medicare HMO | Admitting: Legal Medicine

## 2020-09-11 ENCOUNTER — Telehealth: Payer: Self-pay | Admitting: Legal Medicine

## 2020-09-11 ENCOUNTER — Encounter: Payer: Self-pay | Admitting: Legal Medicine

## 2020-09-11 VITALS — BP 120/90 | HR 77 | Temp 97.1°F | Resp 16 | Ht 65.0 in | Wt 243.0 lb

## 2020-09-11 DIAGNOSIS — R55 Syncope and collapse: Secondary | ICD-10-CM | POA: Diagnosis not present

## 2020-09-11 DIAGNOSIS — R350 Frequency of micturition: Secondary | ICD-10-CM

## 2020-09-11 DIAGNOSIS — R531 Weakness: Secondary | ICD-10-CM

## 2020-09-11 DIAGNOSIS — L89102 Pressure ulcer of unspecified part of back, stage 2: Secondary | ICD-10-CM

## 2020-09-11 DIAGNOSIS — R296 Repeated falls: Secondary | ICD-10-CM | POA: Diagnosis not present

## 2020-09-11 DIAGNOSIS — I10 Essential (primary) hypertension: Secondary | ICD-10-CM | POA: Diagnosis not present

## 2020-09-11 LAB — CBC WITH DIFFERENTIAL/PLATELET
Basophils Absolute: 0.1 10*3/uL (ref 0.0–0.2)
Basos: 0 %
EOS (ABSOLUTE): 0.3 10*3/uL (ref 0.0–0.4)
Eos: 3 %
Hematocrit: 35.2 % (ref 34.0–46.6)
Hemoglobin: 11.4 g/dL (ref 11.1–15.9)
Immature Grans (Abs): 0 10*3/uL (ref 0.0–0.1)
Immature Granulocytes: 0 %
Lymphocytes Absolute: 1.9 10*3/uL (ref 0.7–3.1)
Lymphs: 16 %
MCH: 28.6 pg (ref 26.6–33.0)
MCHC: 32.4 g/dL (ref 31.5–35.7)
MCV: 88 fL (ref 79–97)
Monocytes Absolute: 0.9 10*3/uL (ref 0.1–0.9)
Monocytes: 8 %
Neutrophils Absolute: 8.4 10*3/uL — ABNORMAL HIGH (ref 1.4–7.0)
Neutrophils: 73 %
Platelets: 482 10*3/uL — ABNORMAL HIGH (ref 150–450)
RBC: 3.99 x10E6/uL (ref 3.77–5.28)
RDW: 12.5 % (ref 11.7–15.4)
WBC: 11.5 10*3/uL — ABNORMAL HIGH (ref 3.4–10.8)

## 2020-09-11 LAB — COMPREHENSIVE METABOLIC PANEL
ALT: 14 IU/L (ref 0–32)
AST: 15 IU/L (ref 0–40)
Albumin/Globulin Ratio: 1.6 (ref 1.2–2.2)
Albumin: 4.5 g/dL (ref 3.8–4.8)
Alkaline Phosphatase: 98 IU/L (ref 44–121)
BUN/Creatinine Ratio: 26 (ref 12–28)
BUN: 22 mg/dL (ref 8–27)
Bilirubin Total: 0.2 mg/dL (ref 0.0–1.2)
CO2: 22 mmol/L (ref 20–29)
Calcium: 10.2 mg/dL (ref 8.7–10.3)
Chloride: 101 mmol/L (ref 96–106)
Creatinine, Ser: 0.85 mg/dL (ref 0.57–1.00)
Globulin, Total: 2.8 g/dL (ref 1.5–4.5)
Glucose: 190 mg/dL — ABNORMAL HIGH (ref 65–99)
Potassium: 4.8 mmol/L (ref 3.5–5.2)
Sodium: 140 mmol/L (ref 134–144)
Total Protein: 7.3 g/dL (ref 6.0–8.5)
eGFR: 77 mL/min/{1.73_m2} (ref >59)

## 2020-09-11 LAB — POCT URINALYSIS DIP (CLINITEK)
Bilirubin, UA: NEGATIVE
Blood, UA: NEGATIVE
Glucose, UA: NEGATIVE mg/dL
Ketones, POC UA: NEGATIVE mg/dL
Nitrite, UA: NEGATIVE
POC PROTEIN,UA: NEGATIVE
Spec Grav, UA: 1.02 (ref 1.010–1.025)
Urobilinogen, UA: 0.2 E.U./dL
pH, UA: 6 (ref 5.0–8.0)

## 2020-09-11 NOTE — Progress Notes (Signed)
Subjective:  Patient ID: Alicia Kelly, female    DOB: [birth year]  Age: 65 y.o. MRN: [id number]  Chief Complaint  Patient presents with  . Balance problems    HPI: fell in bathtub 09/10/2020, bruise on hip.  She has not had good balance since her last fall that was a seizure.  No LOC.  Now trouble with gait.- antalgic.  She has to use wall to hold onto.   Current Outpatient Medications on File Prior to Visit  Medication Sig Dispense Refill  . Accu-Chek Softclix Lancets lancets     . atorvastatin (LIPITOR) 40 MG tablet Take 40 mg by mouth daily.     . Blood Glucose Calibration (ACCU-CHEK AVIVA) SOLN     . clotrimazole-betamethasone (LOTRISONE) cream Apply 1 application topically 2 (two) times daily. 30 g 3  . cyclobenzaprine (FLEXERIL) 10 MG tablet Take 1 tablet (10 mg total) by mouth 3 (three) times daily as needed for muscle spasms. 30 tablet 0  . fluconazole (DIFLUCAN) 100 MG tablet Take 1 tablet (100 mg total) by mouth daily. 7 tablet 0  . glipiZIDE (GLUCOTROL) 5 MG tablet Take 5 mg by mouth 2 (two) times daily.    Marland Kitchen glucose blood test strip 1 each by Other route 3 (three) times daily. Use as instructed 100 each 1  . Insulin Pen Needle (PEN NEEDLES) 31G X 5 MM MISC 1 each by Does not apply route daily. 100 each 2  . lacosamide (VIMPAT) 200 MG TABS tablet Take 300 mg by mouth 2 (two) times daily.    Marland Kitchen levETIRAcetam (KEPPRA) 1000 MG tablet Take 1,000 mg by mouth in the morning, at noon, and at bedtime.    Marland Kitchen losartan-hydrochlorothiazide (HYZAAR) 100-25 MG tablet Take 1 tablet by mouth daily.     . montelukast (SINGULAIR) 10 MG tablet TAKE ONE TABLET BY MOUTH DAILY AS DIRECTED 30 tablet 2  . pantoprazole (PROTONIX) 40 MG tablet Take 40 mg by mouth 2 (two) times daily.    [name] FLEXTOUCH 100 UNIT/ML FlexTouch Pen Inject 26 Units into the skin daily. (Patient taking differently: Inject 28 Units into the skin daily.) 15 mL 5   No current facility-administered medications on file prior  to visit.   Past Medical History:  Diagnosis Date  . Asthma   . Calculus of gallbladder with chronic cholecystitis without obstruction 03/22/2017  . Diabetes ([place])   . Epilepsy ([place])   . Hypertension   . Sleep apnea    Past Surgical History:  Procedure Laterality Date  . APPENDECTOMY    . TONSILLECTOMY    . TONSILLECTOMY      Family History  Problem Relation Age of Onset  . Colon cancer Mother   . Hypertension Father   . Diabetes Father   . Allergic rhinitis Sister   . Hypertension Sister   . Cancer Maternal Uncle   . Cancer Maternal Grandmother   . Cancer Paternal Grandmother   . Diabetes Paternal Grandmother    Social History   Socioeconomic History  . Marital status: Married    Spouse name: Not on file  . Number of children: Not on file  . Years of education: Not on file  . Highest education level: Not on file  Occupational History  . Not on file  Tobacco Use  . Smoking status: Never Smoker  . Smokeless tobacco: Never Used  Vaping Use  . Vaping Use: Never used  Substance and Sexual Activity  . Alcohol use: Not Currently  .  Drug use: Never  . Sexual activity: Not on file  Other Topics Concern  . Not on file  Social History Narrative  . Not on file   Social Determinants of Health   Financial Resource Strain: Not on file  Food Insecurity: No Food Insecurity  . Worried About Charity fundraiser in the Last Year: Never true  . Ran Out of Food in the Last Year: Never true  Transportation Needs: No Transportation Needs  . Lack of Transportation (Medical): No  . Lack of Transportation (Non-Medical): No  Physical Activity: Not on file  Stress: Not on file  Social Connections: Not on file    Review of Systems  Constitutional: Negative for activity change and appetite change.  HENT: Negative for congestion and rhinorrhea.   Eyes: Negative for visual disturbance.  Respiratory: Negative for chest tightness and shortness of breath.   Cardiovascular:  Negative for chest pain, palpitations and leg swelling.  Gastrointestinal: Negative for abdominal distention and abdominal pain.  Genitourinary: Negative for difficulty urinating, dysuria and urgency.  Musculoskeletal: Negative for arthralgias and back pain.  Neurological: Positive for dizziness.  Psychiatric/Behavioral: Negative.      Objective:  BP 120/90   Pulse 77   Temp (!) 97.1 F (36.2 C)   Resp 16   Ht 5\' 5"  (1.651 m)   Wt 243 lb (110.2 kg)   SpO2 98%   BMI 40.44 kg/m   BP/Weight 09/11/2020 09/05/2020 7/42/5956  Systolic BP 387 564 332  Diastolic BP 90 80 80  Wt. (Lbs) 243 245 245  BMI 40.44 40.77 40.77    Physical Exam Vitals reviewed.  Constitutional:      Appearance: Normal appearance. She is obese.  HENT:     Head: Normocephalic.     Right Ear: Tympanic membrane normal.     Left Ear: Tympanic membrane normal.     Nose: Nose normal.     Mouth/Throat:     Mouth: Mucous membranes are moist.     Pharynx: Oropharynx is clear.  Eyes:     Extraocular Movements: Extraocular movements intact.     Conjunctiva/sclera: Conjunctivae normal.     Pupils: Pupils are equal, round, and reactive to light.     Comments: No nystagmus  Cardiovascular:     Rate and Rhythm: Normal rate and regular rhythm.     Pulses: Normal pulses.     Heart sounds: No murmur heard. No gallop.   Pulmonary:     Effort: Pulmonary effort is normal. No respiratory distress.     Breath sounds: Normal breath sounds. No rales.  Abdominal:     General: Abdomen is flat. Bowel sounds are normal. There is no distension.     Palpations: Abdomen is soft.     Tenderness: There is no abdominal tenderness.  Musculoskeletal:        General: Tenderness (let hip) present.     Cervical back: Normal range of motion.  Skin:    General: Skin is warm.     Capillary Refill: Capillary refill takes less than 2 seconds.     Findings: Rash present.     Comments: Confluent rash with satellites in gluteal fold   Neurological:     General: No focal deficit present.     Mental Status: She is alert and oriented to person, place, and time. Mental status is at baseline.     Motor: Weakness present.     Comments: Positive rhomberg to left  Psychiatric:  Mood and Affect: Mood normal.        Thought Content: Thought content normal.       Lab Results  Component Value Date   WBC 10.1 07/03/2020   HGB 11.1 07/03/2020   HCT 34.7 07/03/2020   PLT 476 (H) 07/03/2020   GLUCOSE 200 (H) 07/03/2020   CHOL 134 07/03/2020   TRIG 118 07/03/2020   HDL 56 07/03/2020   LDLCALC 57 07/03/2020   ALT 17 07/03/2020   AST 15 07/03/2020   NA 143 07/03/2020   K 4.6 07/03/2020   CL 102 07/03/2020   CREATININE 0.77 07/03/2020   BUN 19 07/03/2020   CO2 22 07/03/2020   HGBA1C 8.2 (H) 07/03/2020   MICROALBUR 10 07/03/2020      Assessment & Plan:   Diagnoses and all orders for this visit: Syncope, unspecified syncope type -     CT Head Wo Contrast -     DME Walker platform Patient is having recurrent falls to the left with left sided weakness since last fall ? Seizure vs cVA- get Ct scan and labs Patient requires a rollator to ambulate safely and to prevent further decline in ambulatory status  Essential hypertension -     CBC with Differential/Platelet -     Comprehensive metabolic panel An individual hypertension care plan was established and reinforced today.  The patient's status was assessed using clinical findings on exam and labs or diagnostic tests. The patient's success at meeting treatment goals on disease specific evidence-based guidelines and found to be well controlled. SELF MANAGEMENT: The patient and I together assessed ways to personally work towards obtaining the recommended goals. RECOMMENDATIONS: avoid decongestants found in common cold remedies, decrease consumption of alcohol, perform routine monitoring of BP with home BP cuff, exercise, reduction of dietary salt, take medicines as  prescribed, try not to miss doses and quit smoking.  Regular exercise and maintaining a healthy weight is needed.  Stress reduction may help. A CLINICAL SUMMARY including written plan identify barriers to care unique to individual due to social or financial issues.  We attempt to mutually creat solutions for individual and family understanding.  Left-sided weakness -     DME Walker platform Left sided weakness since first fall that she thought was a seizure, the left leg remains weak 3/5  Recurrent falls Patient is now having recurrent falls that is new, she needs CT and rollator for ambulation  Decubitus ulcer of back, stage 2 ([place]) The ulcers have healed but she has a confluent rash in gluteal cleft that is not responding to diflucan and dressings- she is to see dermatologist   No orders of the defined types were placed in this encounter.   Orders Placed This Encounter  Procedures  . DME Walker platform  . CT Head Wo Contrast  . CBC with Differential/Platelet  . Comprehensive metabolic panel     Follow-up: Return in about 1 week (around 09/18/2020) for for balance.  An After Visit Summary was printed and given to the patient.  Reinaldo Meeker, MD [facility] [phone]

## 2020-09-11 NOTE — Addendum Note (Signed)
Addended by: Thompson Caul I on: 09/11/2020 10:10 AM   Modules accepted: Orders

## 2020-09-11 NOTE — Telephone Encounter (Signed)
   Alicia Kelly has been scheduled for the following appointment:  WHAT: Head CT WHERE: [place]: 09/13/20 TIME: 1:30 pm  No solid food 2hrs prior Patient has been made aware.

## 2020-09-12 ENCOUNTER — Other Ambulatory Visit: Payer: Self-pay

## 2020-09-12 MED ORDER — DOXYCYCLINE HYCLATE 100 MG PO TABS: 100.0000 mg | ORAL_TABLET | Freq: Two times a day (BID) | ORAL | 0 refills | Status: DC

## 2020-09-12 MED ORDER — NITROFURANTOIN MONOHYD MACRO 100 MG PO CAPS: 100.0000 mg | ORAL_CAPSULE | Freq: Two times a day (BID) | ORAL | 0 refills | Status: DC

## 2020-09-12 NOTE — Addendum Note (Signed)
Addended by: Reinaldo Meeker on: 09/12/2020 11:03 AM   Modules accepted: Orders

## 2020-09-12 NOTE — Progress Notes (Signed)
I sent in macrobid for UTI lp

## 2020-09-12 NOTE — Progress Notes (Signed)
Wbc 11.5 suggests infection, glucose 190, kidney tests normal, liver tests normal lp

## 2020-09-13 ENCOUNTER — Ambulatory Visit: Payer: Medicare HMO | Admitting: Legal Medicine

## 2020-09-16 DIAGNOSIS — R531 Weakness: Secondary | ICD-10-CM | POA: Diagnosis not present

## 2020-09-16 DIAGNOSIS — R296 Repeated falls: Secondary | ICD-10-CM | POA: Diagnosis not present

## 2020-09-19 ENCOUNTER — Ambulatory Visit: Payer: Medicare HMO | Admitting: Legal Medicine

## 2020-09-19 DIAGNOSIS — L209 Atopic dermatitis, unspecified: Secondary | ICD-10-CM | POA: Diagnosis not present

## 2020-09-20 ENCOUNTER — Telehealth: Payer: Self-pay

## 2020-09-20 NOTE — Progress Notes (Signed)
Chronic Care Management Pharmacy Assistant   Name: Alicia Kelly  MRN: [id number] DOB: 1955-12-23    Made 3 unsuccessful attempts to reach patient   Recent office visits:  09/11/2020: Lillard Anes, MD (PCP)  for Syncope / labs ordered, urine culture and dip, added Nitrofurantoin 100 mg. 1 tablet po bid.   09/05/2020:  Lillard Anes, MD (PCP) for Dysuria/ ordered urine culture and / added Fluconazole 100 mg. 1 po bid, Cyclobenzaprine 10mg  po tid prn.    Recent consult visits:  No consult visits noted since last visit with [facility] visits:  No hospital visits noted since last visit with [facility]  Medications: Outpatient Encounter Medications as of 09/20/2020  Medication Sig  . doxycycline (VIBRA-TABS) 100 MG tablet Take 1 tablet (100 mg total) by mouth 2 (two) times daily.  . Accu-Chek Softclix Lancets lancets   . atorvastatin (LIPITOR) 40 MG tablet Take 40 mg by mouth daily.   . Blood Glucose Calibration (ACCU-CHEK AVIVA) SOLN   . clotrimazole-betamethasone (LOTRISONE) cream Apply 1 application topically 2 (two) times daily.  . cyclobenzaprine (FLEXERIL) 10 MG tablet Take 1 tablet (10 mg total) by mouth 3 (three) times daily as needed for muscle spasms.  . fluconazole (DIFLUCAN) 100 MG tablet Take 1 tablet (100 mg total) by mouth daily.  Marland Kitchen glipiZIDE (GLUCOTROL) 5 MG tablet Take 5 mg by mouth 2 (two) times daily.  Marland Kitchen glucose blood test strip 1 each by Other route 3 (three) times daily. Use as instructed  . Insulin Pen Needle (PEN NEEDLES) 31G X 5 MM MISC 1 each by Does not apply route daily.  Marland Kitchen lacosamide (VIMPAT) 200 MG TABS tablet Take 300 mg by mouth 2 (two) times daily.  Marland Kitchen levETIRAcetam (KEPPRA) 1000 MG tablet Take 1,000 mg by mouth in the morning, at noon, and at bedtime.  Marland Kitchen losartan-hydrochlorothiazide (HYZAAR) 100-25 MG tablet Take 1 tablet by mouth daily.   . montelukast (SINGULAIR) 10 MG tablet TAKE ONE TABLET BY MOUTH DAILY AS DIRECTED  . pantoprazole  (PROTONIX) 40 MG tablet Take 40 mg by mouth 2 (two) times daily.  [name] FLEXTOUCH 100 UNIT/ML FlexTouch Pen Inject 26 Units into the skin daily. (Patient taking differently: Inject 28 Units into the skin daily.)   No facility-administered encounter medications on file as of 09/20/2020.     Recent Office Vitals: BP Readings from Last 3 Encounters:  09/11/20 120/90  09/05/20 140/80  08/14/20 136/80   Pulse Readings from Last 3 Encounters:  09/11/20 77  09/05/20 80  08/14/20 77    Wt Readings from Last 3 Encounters:  09/11/20 243 lb (110.2 kg)  09/05/20 245 lb (111.1 kg)  08/14/20 245 lb (111.1 kg)     Kidney Function Lab Results  Component Value Date/Time   CREATININE 0.85 09/11/2020 10:00 AM   CREATININE 0.77 07/03/2020 11:07 AM   GFRNONAA 82 07/03/2020 11:07 AM   GFRAA 95 07/03/2020 11:07 AM    BMP Latest Ref Rng & Units 09/11/2020 07/03/2020  Glucose 65 - 99 mg/dL 190(H) 200(H)  BUN 8 - 27 mg/dL 22 19  Creatinine 0.57 - 1.00 mg/dL 0.85 0.77  BUN/Creat Ratio 12 - 28 26 25   Sodium 134 - 144 mmol/L 140 143  Potassium 3.5 - 5.2 mmol/L 4.8 4.6  Chloride 96 - 106 mmol/L 101 102  CO2 20 - 29 mmol/L 22 22  Calcium 8.7 - 10.3 mg/dL 10.2 10.0     . Current antihypertensive regimen:  Losartan-HCTZ 100-25 mg. Tablet: Take 1 po qd  Adherence Review:  Is the patient currently on ACE/ARB medication? Lisinopril-HCTZ  Does the patient have >5 day gap between last estimated fill dates? YES, patient has >5 day gap between estimated fill dates.  CPP to review   Star Rating Drugs:  Medication:  Last Fill: Day Supply Atorvastatin  02/15/2020 90DS   Losartan-HCTZ 01/29/2020 90DS   Made 3 unsuccessful attempts to reach patient  Marcine Matar, [place] Clinical Pharmacist Assistant

## 2020-10-01 DIAGNOSIS — G8929 Other chronic pain: Secondary | ICD-10-CM | POA: Diagnosis not present

## 2020-10-01 DIAGNOSIS — M545 Low back pain, unspecified: Secondary | ICD-10-CM | POA: Diagnosis not present

## 2020-10-02 ENCOUNTER — Ambulatory Visit (INDEPENDENT_AMBULATORY_CARE_PROVIDER_SITE_OTHER): Payer: Medicare HMO | Admitting: Legal Medicine

## 2020-10-02 ENCOUNTER — Encounter: Payer: Self-pay | Admitting: Legal Medicine

## 2020-10-02 ENCOUNTER — Other Ambulatory Visit: Payer: Self-pay

## 2020-10-02 VITALS — BP 130/82 | HR 92 | Temp 97.2°F | Ht 66.0 in | Wt 242.8 lb

## 2020-10-02 DIAGNOSIS — E1159 Type 2 diabetes mellitus with other circulatory complications: Secondary | ICD-10-CM | POA: Diagnosis not present

## 2020-10-02 DIAGNOSIS — K2100 Gastro-esophageal reflux disease with esophagitis, without bleeding: Secondary | ICD-10-CM

## 2020-10-02 DIAGNOSIS — I152 Hypertension secondary to endocrine disorders: Secondary | ICD-10-CM | POA: Diagnosis not present

## 2020-10-02 DIAGNOSIS — E782 Mixed hyperlipidemia: Secondary | ICD-10-CM | POA: Diagnosis not present

## 2020-10-02 DIAGNOSIS — Z780 Asymptomatic menopausal state: Secondary | ICD-10-CM | POA: Diagnosis not present

## 2020-10-02 DIAGNOSIS — I10 Essential (primary) hypertension: Secondary | ICD-10-CM

## 2020-10-02 DIAGNOSIS — G4733 Obstructive sleep apnea (adult) (pediatric): Secondary | ICD-10-CM

## 2020-10-02 DIAGNOSIS — E669 Obesity, unspecified: Secondary | ICD-10-CM

## 2020-10-02 DIAGNOSIS — L89102 Pressure ulcer of unspecified part of back, stage 2: Secondary | ICD-10-CM

## 2020-10-02 DIAGNOSIS — E1169 Type 2 diabetes mellitus with other specified complication: Secondary | ICD-10-CM

## 2020-10-02 DIAGNOSIS — G40209 Localization-related (focal) (partial) symptomatic epilepsy and epileptic syndromes with complex partial seizures, not intractable, without status epilepticus: Secondary | ICD-10-CM

## 2020-10-02 DIAGNOSIS — E119 Type 2 diabetes mellitus without complications: Secondary | ICD-10-CM

## 2020-10-02 DIAGNOSIS — Z23 Encounter for immunization: Secondary | ICD-10-CM | POA: Diagnosis not present

## 2020-10-02 NOTE — Addendum Note (Signed)
Addended by: Thompson Caul I on: 10/02/2020 04:23 PM   Modules accepted: Orders

## 2020-10-02 NOTE — Progress Notes (Signed)
Subjective:  Patient ID: Alicia Kelly, female    DOB: Mar 14, 1956  Age: 65 y.o. MRN: [id number]  Chief Complaint  Patient presents with  . Diabetes    HPI: chronic care  Patient present with type 2 diabetes.  Specifically, this is type 2, insulin requiring diabetes, complicated by hypertension and hyperlipidemia.  Compliance with treatment has been good; patient take medicines as directed, maintains diet and exercise regimen, follows up as directed, and is keeping glucose diary.  Date of  diagnosis 2010.  Depression screen has been performed.Tobacco screen none. Current medicines for diabetes tresiba 28 units and glipizide, we discussed discontinuing.  Patient is on losartab for renal protection and atorvastatin for cholesterol control.  Patient performs foot exams daily and last ophthalmologic exam was no.recent epidural  Patient presents for follow up of hypertension.  Patient tolerating losartan/hctz well with no side effects.  Patient was diagnosed with hypertension 10 so has been treated for hypertension for 10 years.Patient is working on maintaining diet and exercise regimen and follows up as directed. Complication include none.  Patient presents with hyperlipidemia.  Compliance with treatment has been good; patient takes medicines as directed, maintains low cholesterol diet, follows up as directed, and maintains exercise regimen.  Patient is using atorvastatin without problems.   Current Outpatient Medications on File Prior to Visit  Medication Sig Dispense Refill  . Accu-Chek Softclix Lancets lancets     . atorvastatin (LIPITOR) 40 MG tablet Take 40 mg by mouth daily.     . Blood Glucose Calibration (ACCU-CHEK AVIVA) SOLN     . clotrimazole-betamethasone (LOTRISONE) cream Apply 1 application topically 2 (two) times daily. 30 g 3  . cyclobenzaprine (FLEXERIL) 10 MG tablet Take 1 tablet (10 mg total) by mouth 3 (three) times daily as needed for muscle spasms. 30 tablet 0  .  doxycycline (VIBRA-TABS) 100 MG tablet Take 1 tablet (100 mg total) by mouth 2 (two) times daily. 20 tablet 0  . fluconazole (DIFLUCAN) 100 MG tablet Take 1 tablet (100 mg total) by mouth daily. 7 tablet 0  . glipiZIDE (GLUCOTROL) 5 MG tablet Take 5 mg by mouth 2 (two) times daily.    Marland Kitchen glucose blood test strip 1 each by Other route 3 (three) times daily. Use as instructed 100 each 1  . Insulin Pen Needle (PEN NEEDLES) 31G X 5 MM MISC 1 each by Does not apply route daily. 100 each 2  . lacosamide (VIMPAT) 200 MG TABS tablet Take 300 mg by mouth 2 (two) times daily.    Marland Kitchen levETIRAcetam (KEPPRA) 1000 MG tablet Take 1,000 mg by mouth in the morning, at noon, and at bedtime.    Marland Kitchen losartan-hydrochlorothiazide (HYZAAR) 100-25 MG tablet Take 1 tablet by mouth daily.     . montelukast (SINGULAIR) 10 MG tablet TAKE ONE TABLET BY MOUTH DAILY AS DIRECTED 30 tablet 2  . pantoprazole (PROTONIX) 40 MG tablet Take 40 mg by mouth 2 (two) times daily.    [name] FLEXTOUCH 100 UNIT/ML FlexTouch Pen Inject 26 Units into the skin daily. (Patient taking differently: Inject 28 Units into the skin daily.) 15 mL 5   No current facility-administered medications on file prior to visit.   Past Medical History:  Diagnosis Date  . Asthma   . Calculus of gallbladder with chronic cholecystitis without obstruction 03/22/2017  . Diabetes ([place])   . Epilepsy ([place])   . Hypertension   . Sleep apnea    Past Surgical History:  Procedure Laterality Date  .  APPENDECTOMY    . TONSILLECTOMY    . TONSILLECTOMY      Family History  Problem Relation Age of Onset  . Colon cancer Mother   . Hypertension Father   . Diabetes Father   . Allergic rhinitis Sister   . Hypertension Sister   . Cancer Maternal Uncle   . Cancer Maternal Grandmother   . Cancer Paternal Grandmother   . Diabetes Paternal Grandmother    Social History   Socioeconomic History  . Marital status: Married    Spouse name: Not on file  . Number of  children: Not on file  . Years of education: Not on file  . Highest education level: Not on file  Occupational History  . Not on file  Tobacco Use  . Smoking status: Never Smoker  . Smokeless tobacco: Never Used  Vaping Use  . Vaping Use: Never used  Substance and Sexual Activity  . Alcohol use: Not Currently  . Drug use: Never  . Sexual activity: Not on file  Other Topics Concern  . Not on file  Social History Narrative  . Not on file   Social Determinants of Health   Financial Resource Strain: Not on file  Food Insecurity: No Food Insecurity  . Worried About Charity fundraiser in the Last Year: Never true  . Ran Out of Food in the Last Year: Never true  Transportation Needs: No Transportation Needs  . Lack of Transportation (Medical): No  . Lack of Transportation (Non-Medical): No  Physical Activity: Not on file  Stress: Not on file  Social Connections: Not on file    Review of Systems  Constitutional: Negative for activity change and appetite change.  HENT: Negative for congestion and sinus pain.   Eyes: Negative for visual disturbance.  Respiratory: Negative for chest tightness and shortness of breath.   Cardiovascular: Negative for chest pain, palpitations and leg swelling.  Gastrointestinal: Negative for abdominal distention and abdominal pain.  Genitourinary: Negative for difficulty urinating, dyspareunia and urgency.  Musculoskeletal: Negative for back pain.  Skin: Negative.   Neurological: Positive for seizures.  Psychiatric/Behavioral: Negative.      Objective:  BP 130/82 (BP Location: Left Arm, Patient Position: Sitting, Cuff Size: Normal)   Pulse 92   Temp (!) 97.2 F (36.2 C) (Temporal)   Ht 5\' 6"  (1.676 m)   Wt 242 lb 12.8 oz (110.1 kg)   SpO2 96%   BMI 39.19 kg/m   BP/Weight 10/02/2020 09/11/2020 8/41/3244  Systolic BP 010 272 536  Diastolic BP 82 90 80  Wt. (Lbs) 242.8 243 245  BMI 39.19 40.44 40.77    Physical Exam Vitals reviewed.   Constitutional:      Appearance: Normal appearance. She is obese.  Neurological:     Mental Status: She is alert.     Diabetic Foot Exam - Simple   Simple Foot Form Visual Inspection See comments: Yes Sensation Testing Intact to touch and monofilament testing bilaterally: Yes Pulse Check Posterior Tibialis and Dorsalis pulse intact bilaterally: Yes Comments bunion bilat      Lab Results  Component Value Date   WBC 11.5 (H) 09/11/2020   HGB 11.4 09/11/2020   HCT 35.2 09/11/2020   PLT 482 (H) 09/11/2020   GLUCOSE 190 (H) 09/11/2020   CHOL 134 07/03/2020   TRIG 118 07/03/2020   HDL 56 07/03/2020   LDLCALC 57 07/03/2020   ALT 14 09/11/2020   AST 15 09/11/2020   NA 140 09/11/2020  K 4.8 09/11/2020   CL 101 09/11/2020   CREATININE 0.85 09/11/2020   BUN 22 09/11/2020   CO2 22 09/11/2020   HGBA1C 8.2 (H) 07/03/2020   MICROALBUR 10 07/03/2020      Assessment & Plan:   1. Essential hypertension An individual hypertension care plan was established and reinforced today.  The patient's status was assessed using clinical findings on exam and labs or diagnostic tests. The patient's success at meeting treatment goals on disease specific evidence-based guidelines and found to be fair controlled. SELF MANAGEMENT: The patient and I together assessed ways to personally work towards obtaining the recommended goals. RECOMMENDATIONS: avoid decongestants found in common cold remedies, decrease consumption of alcohol, perform routine monitoring of BP with home BP cuff, exercise, reduction of dietary salt, take medicines as prescribed, try not to miss doses and quit smoking.  Regular exercise and maintaining a healthy weight is needed.  Stress reduction may help. A CLINICAL SUMMARY including written plan identify barriers to care unique to individual due to social or financial issues.  We attempt to mutually creat solutions for individual and family understanding.  2. Obesity, diabetes,  and hypertension syndrome ([place]) An individual care plan for diabetes was established and reinforced today.  The patient's status was assessed using clinical findings on exam, labs and diagnostic testing. Patient success at meeting goals based on disease specific evidence-based guidelines and found to be fair controlled. Medications were assessed and patient's understanding of the medical issues , including barriers were assessed. Recommend adherence to a diabetic diet, a graduated exercise program, HgbA1c level is checked quarterly, and urine microalbumin performed yearly .  Annual mono-filament sensation testing performed. Lower blood pressure and control hyperlipidemia is important. Get annual eye exams and annual flu shots and smoking cessation discussed.  Self management goals were discussed.  3. Obstructive sleep apnea syndrome Using CPAP consistently every night and medically benefiting from its use.  4. Gastroesophageal reflux disease with esophagitis without hemorrhage Plan of care was formulated today.  She is doing well.  A plan of care was formulated using patient exam, tests and other sources to optimize care using evidence based information.  Recommend no smoking, no eating after supper, avoid fatty foods, elevate Head of bed, avoid tight fitting clothing.  Continue on omeprazole.  5. Partial symptomatic epilepsy with complex partial seizures, not intractable, without status epilepticus ([place]) AN INDIVIDUAL CARE PLAN for seizures was established and reinforced today.  The patient's status was assessed using clinical findings on exam, labs, and other diagnostic testing. Patient's success at meeting treatment goals based on disease specific evidence-bassed guidelines and found to be in good control. RECOMMENDATIONS include maintaining present medicines and treatment. Seizures are infrequent.   7. Mixed hyperlipidemia AN INDIVIDUAL CARE PLAN for hyperlipidemia/ cholesterol was established  and reinforced today.  The patient's status was assessed using clinical findings on exam, lab and other diagnostic tests. The patient's disease status was assessed based on evidence-based guidelines and found to be fair controlled. MEDICATIONS were reviewed. SELF MANAGEMENT GOALS have been discussed and patient's success at attaining the goal of low cholesterol was assessed. RECOMMENDATION given include regular exercise 3 days a week and low cholesterol/low fat diet. CLINICAL SUMMARY including written plan to identify barriers unique to the patient due to social or economic  reasons was discussed.  8. Decubitus ulcer of back, stage 2 ([place]) This is healing home health nurse is caring for this  9. Need for prophylactic vaccination against Streptococcus pneumoniae (pneumococcus) -  Pneumococcal polysaccharide vaccine 23-valent greater than or equal to 2yo subcutaneous/IM  10. Menopause - MM Digital Screening    30 minute visit  Orders Placed This Encounter  Procedures  . MM Digital Screening  . Pneumococcal polysaccharide vaccine 23-valent greater than or equal to 2yo subcutaneous/IM  . CBC with Differential/Platelet  . Comprehensive metabolic panel  . Hemoglobin A1c  . Lipid panel     Follow-up: Return in about 1 month (around 11/02/2020) for well woman exam.  An After Visit Summary was printed and given to the patient.  Reinaldo Meeker, MD [facility] [phone]

## 2020-10-03 ENCOUNTER — Ambulatory Visit (INDEPENDENT_AMBULATORY_CARE_PROVIDER_SITE_OTHER): Payer: Medicare HMO

## 2020-10-03 ENCOUNTER — Other Ambulatory Visit: Payer: Self-pay

## 2020-10-03 ENCOUNTER — Encounter: Payer: Self-pay | Admitting: Legal Medicine

## 2020-10-03 DIAGNOSIS — D485 Neoplasm of uncertain behavior of skin: Secondary | ICD-10-CM | POA: Diagnosis not present

## 2020-10-03 DIAGNOSIS — L309 Dermatitis, unspecified: Secondary | ICD-10-CM | POA: Diagnosis not present

## 2020-10-03 DIAGNOSIS — D72828 Other elevated white blood cell count: Secondary | ICD-10-CM

## 2020-10-03 LAB — LIPID PANEL
Chol/HDL Ratio: 2.6 ratio (ref 0.0–4.4)
Cholesterol, Total: 156 mg/dL (ref 100–199)
HDL: 61 mg/dL (ref >39)
LDL Chol Calc (NIH): 75 mg/dL (ref 0–99)
Triglycerides: 110 mg/dL (ref 0–149)
VLDL Cholesterol Cal: 20 mg/dL (ref 5–40)

## 2020-10-03 LAB — POCT URINALYSIS DIP (CLINITEK)
Bilirubin, UA: NEGATIVE
Blood, UA: NEGATIVE
Glucose, UA: NEGATIVE mg/dL
Ketones, POC UA: NEGATIVE mg/dL
Leukocytes, UA: NEGATIVE
Nitrite, UA: NEGATIVE
POC PROTEIN,UA: NEGATIVE
Spec Grav, UA: 1.015 (ref 1.010–1.025)
Urobilinogen, UA: 0.2 E.U./dL
pH, UA: 6 (ref 5.0–8.0)

## 2020-10-03 LAB — CBC WITH DIFFERENTIAL/PLATELET
Basophils Absolute: 0 10*3/uL (ref 0.0–0.2)
Basos: 0 %
EOS (ABSOLUTE): 0 10*3/uL (ref 0.0–0.4)
Eos: 0 %
Hematocrit: 35.4 % (ref 34.0–46.6)
Hemoglobin: 11.1 g/dL (ref 11.1–15.9)
Immature Grans (Abs): 0.1 10*3/uL (ref 0.0–0.1)
Immature Granulocytes: 1 %
Lymphocytes Absolute: 1.3 10*3/uL (ref 0.7–3.1)
Lymphs: 9 %
MCH: 28.4 pg (ref 26.6–33.0)
MCHC: 31.4 g/dL — ABNORMAL LOW (ref 31.5–35.7)
MCV: 91 fL (ref 79–97)
Monocytes Absolute: 0.8 10*3/uL (ref 0.1–0.9)
Monocytes: 6 %
Neutrophils Absolute: 12 10*3/uL — ABNORMAL HIGH (ref 1.4–7.0)
Neutrophils: 84 %
Platelets: 490 10*3/uL — ABNORMAL HIGH (ref 150–450)
RBC: 3.91 x10E6/uL (ref 3.77–5.28)
RDW: 13.2 % (ref 11.7–15.4)
WBC: 14.3 10*3/uL — ABNORMAL HIGH (ref 3.4–10.8)

## 2020-10-03 LAB — COMPREHENSIVE METABOLIC PANEL
ALT: 15 IU/L (ref 0–32)
AST: 9 IU/L (ref 0–40)
Albumin/Globulin Ratio: 1.7 (ref 1.2–2.2)
Albumin: 4.5 g/dL (ref 3.8–4.8)
Alkaline Phosphatase: 85 IU/L (ref 44–121)
BUN/Creatinine Ratio: 27 (ref 12–28)
BUN: 22 mg/dL (ref 8–27)
Bilirubin Total: 0.3 mg/dL (ref 0.0–1.2)
CO2: 23 mmol/L (ref 20–29)
Calcium: 9.9 mg/dL (ref 8.7–10.3)
Chloride: 101 mmol/L (ref 96–106)
Creatinine, Ser: 0.83 mg/dL (ref 0.57–1.00)
Globulin, Total: 2.6 g/dL (ref 1.5–4.5)
Glucose: 221 mg/dL — ABNORMAL HIGH (ref 65–99)
Potassium: 4.8 mmol/L (ref 3.5–5.2)
Sodium: 138 mmol/L (ref 134–144)
Total Protein: 7.1 g/dL (ref 6.0–8.5)
eGFR: 79 mL/min/{1.73_m2} (ref >59)

## 2020-10-03 LAB — HEMOGLOBIN A1C
Est. average glucose Bld gHb Est-mCnc: 180 mg/dL
Hgb A1c MFr Bld: 7.9 % — ABNORMAL HIGH (ref 4.8–5.6)

## 2020-10-03 LAB — CARDIOVASCULAR RISK ASSESSMENT

## 2020-10-03 NOTE — Progress Notes (Signed)
High wbc 14,300- any infection?, no anemia, glucose 221, kidney and liver tests normal, A1c 7.9, Cholesterol normal lp

## 2020-10-10 ENCOUNTER — Other Ambulatory Visit: Payer: Self-pay | Admitting: Legal Medicine

## 2020-10-10 ENCOUNTER — Other Ambulatory Visit: Payer: Self-pay

## 2020-10-10 ENCOUNTER — Other Ambulatory Visit (INDEPENDENT_AMBULATORY_CARE_PROVIDER_SITE_OTHER): Payer: Medicare HMO

## 2020-10-10 DIAGNOSIS — D72828 Other elevated white blood cell count: Secondary | ICD-10-CM

## 2020-10-10 DIAGNOSIS — L89102 Pressure ulcer of unspecified part of back, stage 2: Secondary | ICD-10-CM

## 2020-10-10 LAB — POCT URINALYSIS DIP (CLINITEK)
Bilirubin, UA: NEGATIVE
Blood, UA: NEGATIVE
Glucose, UA: 500 mg/dL — AB
Ketones, POC UA: NEGATIVE mg/dL
Leukocytes, UA: NEGATIVE
Nitrite, UA: NEGATIVE
Spec Grav, UA: 1.015 (ref 1.010–1.025)
Urobilinogen, UA: 0.2 E.U./dL
pH, UA: 6 (ref 5.0–8.0)

## 2020-10-10 LAB — CBC WITH DIFFERENTIAL/PLATELET
Basophils Absolute: 0 10*3/uL (ref 0.0–0.2)
Basos: 0 %
EOS (ABSOLUTE): 0.1 10*3/uL (ref 0.0–0.4)
Eos: 1 %
Hematocrit: 34.5 % (ref 34.0–46.6)
Hemoglobin: 11.3 g/dL (ref 11.1–15.9)
Immature Grans (Abs): 0 10*3/uL (ref 0.0–0.1)
Immature Granulocytes: 0 %
Lymphocytes Absolute: 2.1 10*3/uL (ref 0.7–3.1)
Lymphs: 15 %
MCH: 28.7 pg (ref 26.6–33.0)
MCHC: 32.8 g/dL (ref 31.5–35.7)
MCV: 88 fL (ref 79–97)
Monocytes Absolute: 1.2 10*3/uL — ABNORMAL HIGH (ref 0.1–0.9)
Monocytes: 9 %
Neutrophils Absolute: 10.4 10*3/uL — ABNORMAL HIGH (ref 1.4–7.0)
Neutrophils: 75 %
Platelets: 483 10*3/uL — ABNORMAL HIGH (ref 150–450)
RBC: 3.94 x10E6/uL (ref 3.77–5.28)
RDW: 13.3 % (ref 11.7–15.4)
WBC: 13.9 10*3/uL — ABNORMAL HIGH (ref 3.4–10.8)

## 2020-10-10 NOTE — Progress Notes (Signed)
No infection lp

## 2020-10-11 ENCOUNTER — Other Ambulatory Visit: Payer: Self-pay

## 2020-10-11 DIAGNOSIS — D72828 Other elevated white blood cell count: Secondary | ICD-10-CM

## 2020-10-11 NOTE — Progress Notes (Signed)
Wbc chronically up, platelets high this is chronic, has she seen a hematologist ? lp

## 2020-10-12 ENCOUNTER — Other Ambulatory Visit: Payer: Self-pay | Admitting: Legal Medicine

## 2020-10-15 ENCOUNTER — Telehealth: Payer: Self-pay | Admitting: Oncology

## 2020-10-15 NOTE — Telephone Encounter (Signed)
Patient referred by Dr Reinaldo Meeker for Elevated WBC.  Appt made for 10/25/20 Labs 1:45 pm Consult 2:15 pm

## 2020-10-22 NOTE — Progress Notes (Deleted)
Chronic Care Management Pharmacy Note  10/22/2020 Name:  Alicia Kelly MRN:  [id number] DOB:  08-Feb-1956   Plan Recommendations:   Recommend patient get Shingrix shot.   Discussed benefit of improved blood sugar control. Patient plans [place]. Will revisit adjusting medication after next a1c result. Discussed option of once weekly or oral daily GLP-1 for improved blood sugar control and reduce insulin use.   Subjective: Alicia Kelly is an 65 y.o. year old female who is a primary patient of Henrene Pastor, Zeb Comfort, MD.  The CCM team was consulted for assistance with disease management and care coordination needs.    Engaged with patient by telephone for initial visit in response to provider referral for pharmacy case management and/or care coordination services.   Consent to Services:  The patient was given the following information about Chronic Care Management services today, agreed to services, and gave verbal consent: 1. CCM service includes personalized support from designated clinical staff supervised by the primary care provider, including individualized plan of care and coordination with other care providers 2. 24/7 contact phone numbers for assistance for urgent and routine care needs. 3. Service will only be billed when office clinical staff spend 20 minutes or more in a month to coordinate care. 4. Only one practitioner may furnish and bill the service in a calendar month. 5.The patient may stop CCM services at any time (effective at the end of the month) by phone call to the office staff. 6. The patient will be responsible for cost sharing (co-pay) of up to 20% of the service fee (after annual deductible is met). Patient agreed to services and consent obtained.  Patient Care Team: Lillard Anes, MD as PCP - General (Family Medicine) Burnice Logan, [facility] as Pharmacist (Pharmacist)  Recent office visits: 08/14/2020 - ulcer  healing well. Reocever and check in 1 week.  08/02/2020 - wound healed.  07/26/2020 - wound care. Lotrisone prescribed.  07/18/2020 - wound care performed.  07/11/2020 - wound care performed.  07/08/2020 - decubitus ulcer of back stage 2 - wound care performed.  07/03/2020 - Platelet high probably from ulcer, glucose 200, kidney tests normal, liver tests normal, A1c 8.2, cholesterol normal, increase  insulin to 28 units a day. Positive sleep study but unable to follow-up for titration.  Recent consult visits: 06/26/2020 - [place] - stage 3 pressure ulcer. D/C peroxide. Wash aera with soap and water. Follow-up in 2 weeks. Discussed pressure relief and offloading.  03/01/2020 - neurology - continue same meds. Patient declines additional seizure medication.  Hospital visits: None in previous 6 months  Objective:  Lab Results  Component Value Date   CREATININE 0.83 10/02/2020   BUN 22 10/02/2020   GFRNONAA 82 07/03/2020   GFRAA 95 07/03/2020   NA 138 10/02/2020   K 4.8 10/02/2020   CALCIUM 9.9 10/02/2020   CO2 23 10/02/2020   GLUCOSE 221 (H) 10/02/2020    Lab Results  Component Value Date/Time   HGBA1C 7.9 (H) 10/02/2020 08:57 AM   HGBA1C 8.2 (H) 07/03/2020 11:07 AM   MICROALBUR 10 07/03/2020 10:25 AM    Last diabetic Eye exam: No results found for: HMDIABEYEEXA  Last diabetic Foot exam: No results found for: HMDIABFOOTEX   Lab Results  Component Value Date   CHOL 156 10/02/2020   HDL 61 10/02/2020   LDLCALC 75 10/02/2020   TRIG 110 10/02/2020   CHOLHDL 2.6 10/02/2020    Hepatic Function  Latest Ref Rng & Units 10/02/2020 09/11/2020 07/03/2020  Total Protein 6.0 - 8.5 g/dL 7.1 7.3 6.7  Albumin 3.8 - 4.8 g/dL 4.5 4.5 4.3  AST 0 - 40 IU/L '9 15 15  ' ALT 0 - 32 IU/L '15 14 17  ' Alk Phosphatase 44 - 121 IU/L 85 98 74  Total Bilirubin 0.0 - 1.2 mg/dL 0.3 0.2 0.4    No results found for: TSH, FREET4  CBC Latest Ref Rng & Units 10/10/2020 10/02/2020 09/11/2020  WBC 3.4 - 10.8  x10E3/uL 13.9(H) 14.3(H) 11.5(H)  Hemoglobin 11.1 - 15.9 g/dL 11.3 11.1 11.4  Hematocrit 34.0 - 46.6 % 34.5 35.4 35.2  Platelets 150 - 450 x10E3/uL 483(H) 490(H) 482(H)    No results found for: VD25OH  Clinical ASCVD: No  The 10-year ASCVD risk score Mikey Bussing DC Jr., et al., 2013) is: 9.6%   Values used to calculate the score:     Age: 65 years     Sex: Female     Is Non-Hispanic African American: No     Diabetic: Yes     Tobacco smoker: No     Systolic Blood Pressure: 518 mmHg     Is BP treated: Yes     HDL Cholesterol: 61 mg/dL     Total Cholesterol: 156 mg/dL    Depression screen [facility] 2/9 07/03/2020  Decreased Interest 0  Down, Depressed, Hopeless 0  PHQ - 2 Score 0      Social History   Tobacco Use  Smoking Status Never Smoker  Smokeless Tobacco Never Used   BP Readings from Last 3 Encounters:  10/02/20 130/82  09/11/20 120/90  09/05/20 140/80   Pulse Readings from Last 3 Encounters:  10/02/20 92  09/11/20 77  09/05/20 80   Wt Readings from Last 3 Encounters:  10/02/20 242 lb 12.8 oz (110.1 kg)  09/11/20 243 lb (110.2 kg)  09/05/20 245 lb (111.1 kg)   BMI Readings from Last 3 Encounters:  10/02/20 39.19 kg/m  09/11/20 40.44 kg/m  09/05/20 40.77 kg/m    Assessment/Interventions: Review of patient past medical history, allergies, medications, health status, including review of consultants reports, laboratory and other test data, was performed as part of comprehensive evaluation and provision of chronic care management services.   SDOH:  (Social Determinants of Health) assessments and interventions performed: Yes  SDOH Screenings   Alcohol Screen: Not on file  Depression (PHQ2-9): Low Risk   . PHQ-2 Score: 0  Financial Resource Strain: Not on file  Food Insecurity: No Food Insecurity  . Worried About Charity fundraiser in the Last Year: Never true  . Ran Out of Food in the Last Year: Never true  Housing: Low Risk   . Last Housing Risk Score: 0   Physical Activity: Not on file  Social Connections: Not on file  Stress: Not on file  Tobacco Use: Low Risk   . Smoking Tobacco Use: Never Smoker  . Smokeless Tobacco Use: Never Used  Transportation Needs: No Transportation Needs  . Lack of Transportation (Medical): No  . Lack of Transportation (Non-Medical): No    CCM Care Plan  Allergies  Allergen Reactions  . Levofloxacin Other (See Comments)    Throat swelling  . Lisinopril Hives  . Nitrofurantoin Swelling    Throat swelling  . Penicillins Anaphylaxis  . Clarithromycin Other (See Comments)    seizures  . Sulfamethoxazole-Trimethoprim     Causes seizures    Medications Reviewed Today    Reviewed by Henrene Pastor,  Zeb Comfort, MD (Physician) on 10/02/20 at [phone]  Med List Status: <None>  Medication Order Taking? Sig Documenting Provider Last Dose Status Informant  Accu-Chek Softclix Lancets lancets [id number] Yes  [provider] Taking Active   atorvastatin (LIPITOR) 40 MG tablet [id number] Yes Take 40 mg by mouth daily.  [provider] Taking Active   Blood Glucose Calibration ([place]) SOLN [id number] Yes  [provider] Taking Active   clotrimazole-betamethasone (LOTRISONE) cream [id number] Yes Apply 1 application topically 2 (two) times daily. Lillard Anes, MD Taking Active   cyclobenzaprine (FLEXERIL) 10 MG tablet [id number] Yes Take 1 tablet (10 mg total) by mouth 3 (three) times daily as needed for muscle spasms. Lillard Anes, MD Taking Active   doxycycline (VIBRA-TABS) 100 MG tablet [id number] Yes Take 1 tablet (100 mg total) by mouth 2 (two) times daily. Lillard Anes, MD Taking Active   fluconazole (DIFLUCAN) 100 MG tablet [id number] Yes Take 1 tablet (100 mg total) by mouth daily. Lillard Anes, MD Taking Active   glipiZIDE (GLUCOTROL) 5 MG tablet [id number] Yes Take 5 mg by mouth 2 (two) times daily. [provider] Taking Active   glucose  blood test strip [id number] Yes 1 each by Other route 3 (three) times daily. Use as instructed Lillard Anes, MD Taking Active   Insulin Pen Needle (PEN NEEDLES) 31G X 5 MM MISC [id number] Yes 1 each by Does not apply route daily. Lillard Anes, MD Taking Active   lacosamide (VIMPAT) 200 MG TABS tablet [id number] Yes Take 300 mg by mouth 2 (two) times daily. [provider] Taking Active   levETIRAcetam (KEPPRA) 1000 MG tablet [id number] Yes Take 1,000 mg by mouth in the morning, at noon, and at bedtime. [provider] Taking Active   losartan-hydrochlorothiazide (HYZAAR) 100-25 MG tablet [id number] Yes Take 1 tablet by mouth daily.  [provider] Taking Active   montelukast (SINGULAIR) 10 MG tablet [id number] Yes TAKE ONE TABLET BY MOUTH DAILY AS DIRECTED Kozlow, Donnamarie Poag, MD Taking Active   pantoprazole (PROTONIX) 40 MG tablet [id number] Yes Take 40 mg by mouth 2 (two) times daily. [provider] Taking Active   TRESIBA FLEXTOUCH 100 UNIT/ML FlexTouch Pen [id number] Yes Inject 26 Units into the skin daily.  Patient taking differently: Inject 28 Units into the skin daily.   Lillard Anes, MD Taking Active           Patient Active Problem List   Diagnosis Date Noted  . Left-sided weakness 09/11/2020  . Recurrent falls 09/11/2020  . Back pain 09/05/2020  . Obesity, diabetes, and hypertension syndrome ([place]) 07/03/2020  . Decubitus ulcer of back, stage 2 ([place]) 07/03/2020  . Complex partial seizures ([place]) 04/09/2020  . Essential hypertension 08/01/2018  . Hyperlipidemia 08/01/2018  . Coronary artery calcification seen on CT scan 08/01/2018  . Postmenopause 04/30/2017  . Lumbosacral radiculopathy at L4 01/07/2017  . GERD (gastroesophageal reflux disease) 11/29/2016  . Sleep apnea 11/29/2016  . Sinus bradycardia 02/13/2016  . Generalized epilepsy ([place]) 01/05/2013  . Partial epilepsy with impairment of consciousness, with intractable  epilepsy ([place]) 01/05/2013    Immunization History  Administered Date(s) Administered  . Moderna Sars-Covid-2 Vaccination 09/15/2019, 10/13/2019, 05/15/2020  . Pneumococcal Polysaccharide-23 10/02/2020  . Tdap 04/04/2014    Conditions to be addressed/monitored:  Hypertension, Hyperlipidemia, Diabetes, Coronary Artery Disease, GERD and Epilepsy.   There are no care plans that you recently modified to display for this patient.    Medication  Assistance: None required.  Patient affirms current coverage meets needs.  Patient's preferred pharmacy is:  [facility], [place] [place] [address] Phone: [phone] Fax: [facility] [place], [place] [address] Phone: [phone] Fax: [phone]  Uses pill box? No - uses small daily pill container but not weekly planner.  Pt endorses good compliance  We discussed: Benefits of medication synchronization, packaging and delivery as well as enhanced pharmacist oversight with [employer]. Patient decided to: Continue current medication management strategy  Care Plan and Follow Up Patient Decision:  Patient agrees to Care Plan and Follow-up.  Plan: Telephone follow up appointment with care management team member scheduled for:  10/23/2020

## 2020-10-23 ENCOUNTER — Telehealth: Payer: Medicare HMO

## 2020-10-24 ENCOUNTER — Other Ambulatory Visit: Payer: Self-pay | Admitting: Oncology

## 2020-10-24 DIAGNOSIS — D72829 Elevated white blood cell count, unspecified: Secondary | ICD-10-CM

## 2020-10-24 NOTE — Progress Notes (Signed)
[facility]  [address] [place],  [place]  [address] [phone]  Clinic Day:  10/25/2020  Referring physician: Lillard Anes,*   HISTORY OF PRESENT ILLNESS:  The patient is a 65 y.o. female  who I was asked to consult upon for leukocytosis.  Labs in May 2022 showed an elevated white count of 13.9.  Of note, her platelets were also elevated at 483.  Labs dating back 2 years have consistently shown both blood cells to be elevated.  The patient claims to have had a bladder infection 2 months ago.  She also claims to have had a wound on her buttocks, which required salve and antibiotics.  She also recalls having a Kenalog shot given 3-4 weeks ago.  She has never undergone a splenectomy, which can cause chronic leukocytosis.  She also denies having any B symptoms or lymphadenopathy which concerns her for her leukocytosis being due to an underlying hematologic malignancy.  She also denies chronically taking any medications that are known to cause an elevated white count.  To her knowledge, there is no family history of leukocytosis or other hematologic disorders.    PAST MEDICAL HISTORY:   Past Medical History:  Diagnosis Date   Asthma    Calculus of gallbladder with chronic cholecystitis without obstruction 03/22/2017   Diabetes ([place])    Epilepsy ([place])    Hypertension    Sleep apnea     PAST SURGICAL HISTORY:   Past Surgical History:  Procedure Laterality Date   APPENDECTOMY     TONSILLECTOMY     TONSILLECTOMY      CURRENT MEDICATIONS:   Current Outpatient Medications  Medication Sig Dispense Refill   Accu-Chek Softclix Lancets lancets 1 each by Other route 3 (three) times daily. 100 each 2   atorvastatin (LIPITOR) 40 MG tablet Take 40 mg by mouth daily.      Blood Glucose Calibration (ACCU-CHEK AVIVA) SOLN      Blood Glucose Monitoring Suppl (ACCU-CHEK AVIVA PLUS) w/Device KIT 1 each by Does not apply route 3 (three) times daily. 1  kit 0   clotrimazole-betamethasone (LOTRISONE) cream APPLY 1 APPLICATION TOPICALLY 2 TIMES DAILY 30 g 3   cyclobenzaprine (FLEXERIL) 10 MG tablet Take 1 tablet (10 mg total) by mouth 3 (three) times daily as needed for muscle spasms. 30 tablet 0   doxycycline (VIBRA-TABS) 100 MG tablet Take 1 tablet (100 mg total) by mouth 2 (two) times daily. 20 tablet 0   fluconazole (DIFLUCAN) 100 MG tablet Take 1 tablet (100 mg total) by mouth daily. 7 tablet 0   glipiZIDE (GLUCOTROL) 5 MG tablet Take 5 mg by mouth 2 (two) times daily.     glucose blood (ACCU-CHEK AVIVA PLUS) test strip 1 each by Other route 3 (three) times daily. Use as instructed 300 strip 2   Insulin Pen Needle (PEN NEEDLES) 31G X 5 MM MISC 1 each by Does not apply route daily. 100 each 2   lacosamide (VIMPAT) 200 MG TABS tablet Take 300 mg by mouth 2 (two) times daily.     levETIRAcetam (KEPPRA) 1000 MG tablet Take 1,000 mg by mouth in the morning, at noon, and at bedtime.     losartan-hydrochlorothiazide (HYZAAR) 100-25 MG tablet Take 1 tablet by mouth daily.      montelukast (SINGULAIR) 10 MG tablet TAKE ONE TABLET BY MOUTH DAILY AS DIRECTED 30 tablet 2   pantoprazole (PROTONIX) 40 MG tablet Take 1 tablet (40 mg total) by mouth  2 (two) times daily. 90 tablet 1   TRESIBA FLEXTOUCH 100 UNIT/ML FlexTouch Pen Inject 26 Units into the skin daily. (Patient taking differently: Inject 28 Units into the skin daily.) 15 mL 5   No current facility-administered medications for this visit.    ALLERGIES:   Allergies  Allergen Reactions   Levofloxacin Other (See Comments)    Throat swelling   Lisinopril Hives   Nitrofurantoin Swelling    Throat swelling   Penicillins Anaphylaxis   Clarithromycin Other (See Comments)    seizures   Sulfamethoxazole-Trimethoprim     Causes seizures    FAMILY HISTORY:   Family History  Problem Relation Age of Onset   Colon cancer Mother    Hypertension Father    Diabetes Father    Allergic rhinitis  Sister    Hypertension Sister    Cancer Maternal Uncle    Cancer Maternal Grandmother    Cancer Paternal Grandmother    Diabetes Paternal Grandmother   Her maternal grandmother had colon cancer.  SOCIAL HISTORY:  The patient was born and raised in [place].  She lives in [place].  She is widowed, with no children.  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  She also worked at a [employer]. She is on disability from having epilepsy.  There is no history of alcohol or tobacco abuse.    REVIEW OF SYSTEMS:  Review of Systems  Constitutional:  Negative for fatigue and fever.  HENT:   Negative for hearing loss and sore throat.   Eyes:  Negative for eye problems.  Respiratory:  Positive for cough. Negative for chest tightness and hemoptysis.   Cardiovascular:  Negative for chest pain and palpitations.  Gastrointestinal:  Negative for abdominal distention, abdominal pain, blood in stool, constipation, diarrhea, nausea and vomiting.  Endocrine: Negative for hot flashes.  Genitourinary:  Negative for difficulty urinating, dysuria, frequency, hematuria and nocturia.   Musculoskeletal:  Positive for arthralgias. Negative for back pain, gait problem and myalgias.  Skin: Negative.  Negative for itching and rash.  Neurological:  Positive for headaches and seizures. Negative for dizziness, extremity weakness, gait problem, light-headedness and numbness.  Hematological: Negative.   Psychiatric/Behavioral: Negative.  Negative for depression and suicidal ideas. The patient is not nervous/anxious.     PHYSICAL EXAM:  Blood pressure (!) 149/78, pulse 63, temperature 98.6 F (37 C), resp. rate 16, height '5\' 6"'  (1.676 m), weight 242 lb 14.4 oz (110.2 kg), SpO2 98 %. Wt Readings from Last 3 Encounters:  11/04/20 245 lb (111.1 kg)  10/25/20 242 lb 14.4 oz (110.2 kg)  10/02/20 242 lb 12.8 oz (110.1 kg)   Body mass index is 39.21 kg/m. Performance status (ECOG): 2 - Symptomatic, <50% confined to  bed Physical Exam Constitutional:      Appearance: Normal appearance. She is not ill-appearing.  HENT:     Mouth/Throat:     Mouth: Mucous membranes are moist.     Pharynx: Oropharynx is clear. No oropharyngeal exudate or posterior oropharyngeal erythema.  Cardiovascular:     Rate and Rhythm: Normal rate and regular rhythm.     Heart sounds: No murmur heard.   No friction rub. No gallop.  Pulmonary:     Effort: Pulmonary effort is normal. No respiratory distress.     Breath sounds: Normal breath sounds. No wheezing, rhonchi or rales.  Chest:  Breasts:    Right: No axillary adenopathy or supraclavicular adenopathy.     Left: No axillary adenopathy or supraclavicular adenopathy.  Abdominal:     General: Bowel sounds are normal. There is no distension.     Palpations: Abdomen is soft. There is no mass.     Tenderness: There is no abdominal tenderness.  Musculoskeletal:        General: No swelling.     Right lower leg: No edema.     Left lower leg: No edema.  Lymphadenopathy:     Cervical: No cervical adenopathy.     Upper Body:     Right upper body: No supraclavicular or axillary adenopathy.     Left upper body: No supraclavicular or axillary adenopathy.     Lower Body: No right inguinal adenopathy. No left inguinal adenopathy.  Skin:    General: Skin is warm.     Coloration: Skin is not jaundiced.     Findings: No lesion or rash.  Neurological:     General: No focal deficit present.     Mental Status: She is alert and oriented to person, place, and time. Mental status is at baseline.     Cranial Nerves: Cranial nerves are intact.  Psychiatric:        Mood and Affect: Mood normal.        Behavior: Behavior normal.        Thought Content: Thought content normal.    LABS:        ASSESSMENT & PLAN:  A 65 y.o. female who I was asked to consult upon for leukocytosis.  However, her labs today show that her white count is normal.  Furthermore, her white count differential  is normal.  Between her recent Kenalog injection and her sporadic infections, these are reasons why her white count may have been elevated at the time.  There is nothing per her history or physical exam which concerns me for an ominous etiology being present.  Furthermore, all tests used to screen for myeloproliferative disorders came back negative.  I do feel comfortable turning her care back to her primary care office with the recommendation that her CBC be checked on an as needed basis. The patient understands all the plans discussed today and is in agreement with them.  I do appreciate Lillard Anes,* for his new consult.   Rylie Limburg Macarthur Critchley, MD

## 2020-10-25 ENCOUNTER — Encounter: Payer: Self-pay | Admitting: Oncology

## 2020-10-25 ENCOUNTER — Telehealth: Payer: Self-pay | Admitting: Oncology

## 2020-10-25 ENCOUNTER — Other Ambulatory Visit: Payer: Self-pay

## 2020-10-25 ENCOUNTER — Telehealth: Payer: Self-pay

## 2020-10-25 ENCOUNTER — Inpatient Hospital Stay (INDEPENDENT_AMBULATORY_CARE_PROVIDER_SITE_OTHER): Payer: Medicare HMO | Admitting: Oncology

## 2020-10-25 ENCOUNTER — Inpatient Hospital Stay: Payer: Medicare HMO | Attending: Oncology

## 2020-10-25 DIAGNOSIS — Z809 Family history of malignant neoplasm, unspecified: Secondary | ICD-10-CM

## 2020-10-25 DIAGNOSIS — D72829 Elevated white blood cell count, unspecified: Secondary | ICD-10-CM | POA: Diagnosis not present

## 2020-10-25 DIAGNOSIS — K219 Gastro-esophageal reflux disease without esophagitis: Secondary | ICD-10-CM | POA: Insufficient documentation

## 2020-10-25 DIAGNOSIS — Z8249 Family history of ischemic heart disease and other diseases of the circulatory system: Secondary | ICD-10-CM | POA: Insufficient documentation

## 2020-10-25 DIAGNOSIS — Z79899 Other long term (current) drug therapy: Secondary | ICD-10-CM | POA: Insufficient documentation

## 2020-10-25 DIAGNOSIS — Z800 Family history of malignant neoplasm of digestive organs: Secondary | ICD-10-CM

## 2020-10-25 DIAGNOSIS — R519 Headache, unspecified: Secondary | ICD-10-CM | POA: Diagnosis not present

## 2020-10-25 DIAGNOSIS — R569 Unspecified convulsions: Secondary | ICD-10-CM | POA: Insufficient documentation

## 2020-10-25 DIAGNOSIS — Z833 Family history of diabetes mellitus: Secondary | ICD-10-CM | POA: Insufficient documentation

## 2020-10-25 DIAGNOSIS — M2550 Pain in unspecified joint: Secondary | ICD-10-CM | POA: Diagnosis not present

## 2020-10-25 DIAGNOSIS — R059 Cough, unspecified: Secondary | ICD-10-CM | POA: Diagnosis not present

## 2020-10-25 DIAGNOSIS — D72828 Other elevated white blood cell count: Secondary | ICD-10-CM | POA: Diagnosis not present

## 2020-10-25 DIAGNOSIS — I10 Essential (primary) hypertension: Secondary | ICD-10-CM | POA: Insufficient documentation

## 2020-10-25 DIAGNOSIS — D649 Anemia, unspecified: Secondary | ICD-10-CM | POA: Diagnosis not present

## 2020-10-25 LAB — CBC AND DIFFERENTIAL
HCT: 35 — AB (ref 36–46)
Hemoglobin: 11.7 — AB (ref 12.0–16.0)
Neutrophils Absolute: 6.84
Platelets: 389 (ref 150–399)
WBC: 9.5

## 2020-10-25 LAB — CBC: RBC: 3.97 (ref 3.87–5.11)

## 2020-10-25 NOTE — Telephone Encounter (Signed)
No LOS Entered 

## 2020-10-25 NOTE — Progress Notes (Signed)
Chronic Care Management Pharmacy Assistant   Name: Alicia Kelly  MRN: [id number] DOB: 1955/09/20  Alicia Kelly is an 65 y.o. year old female who presents for his follow-up CCM visit with the clinical pharmacist.  Reason for Encounter: Disease State call for DM    Recent office visits:  10/02/2020: Dr. Henrene Pastor (PCP) / Labs ordered/ referral to Ophthalmology / screening mammogram ordered / no medication changes noted    Recent consult visits:  None noted since last CCM visit   Hospital visits:  None noted since last CCM visit   Medications: Outpatient Encounter Medications as of 10/25/2020  Medication Sig   ACCU-CHEK AVIVA PLUS test strip TEST BLOOD SUGAR THREE TIMES DAILY   Accu-Chek Softclix Lancets lancets    atorvastatin (LIPITOR) 40 MG tablet Take 40 mg by mouth daily.    Blood Glucose Calibration (ACCU-CHEK AVIVA) SOLN    clotrimazole-betamethasone (LOTRISONE) cream APPLY 1 APPLICATION TOPICALLY 2 TIMES DAILY   cyclobenzaprine (FLEXERIL) 10 MG tablet Take 1 tablet (10 mg total) by mouth 3 (three) times daily as needed for muscle spasms.   doxycycline (VIBRA-TABS) 100 MG tablet Take 1 tablet (100 mg total) by mouth 2 (two) times daily.   fluconazole (DIFLUCAN) 100 MG tablet Take 1 tablet (100 mg total) by mouth daily.   glipiZIDE (GLUCOTROL) 5 MG tablet Take 5 mg by mouth 2 (two) times daily.   Insulin Pen Needle (PEN NEEDLES) 31G X 5 MM MISC 1 each by Does not apply route daily.   lacosamide (VIMPAT) 200 MG TABS tablet Take 300 mg by mouth 2 (two) times daily.   levETIRAcetam (KEPPRA) 1000 MG tablet Take 1,000 mg by mouth in the morning, at noon, and at bedtime.   losartan-hydrochlorothiazide (HYZAAR) 100-25 MG tablet Take 1 tablet by mouth daily.    montelukast (SINGULAIR) 10 MG tablet TAKE ONE TABLET BY MOUTH DAILY AS DIRECTED   pantoprazole (PROTONIX) 40 MG tablet Take 40 mg by mouth 2 (two) times daily.   TRESIBA FLEXTOUCH 100 UNIT/ML FlexTouch Pen Inject 26 Units  into the skin daily. (Patient taking differently: Inject 28 Units into the skin daily.)   No facility-administered encounter medications on file as of 10/25/2020.    Recent Relevant Labs: Lab Results  Component Value Date/Time   HGBA1C 7.9 (H) 10/02/2020 08:57 AM   HGBA1C 8.2 (H) 07/03/2020 11:07 AM   MICROALBUR 10 07/03/2020 10:25 AM    Kidney Function Lab Results  Component Value Date/Time   CREATININE 0.83 10/02/2020 08:57 AM   CREATININE 0.85 09/11/2020 10:00 AM   GFRNONAA 82 07/03/2020 11:07 AM   GFRAA 95 07/03/2020 11:07 AM     Current antihyperglycemic regimen:   [name] inject 28 units daily  Glipizide 5mg . Twice daily    Patient verbally confirms she is taking the above medications as directed.   Patient stated that she is doing well overall. She had a steroid injection for her back a couple of weeks ago after a fall,  and reported that her sugars were elevated until the past couple of days. She reported that she had an epileptic seizure on 09/02/2020 and it was the worst one that she has had in a year and a half or two years. She stated that she takes her medication for seizures like she is supposed to and that is why it has been so long. She reported having the fall is probably what precipitated the seizure.   How often are you checking your blood sugar? Patient stated  that she typically checks it  3x daily.   What are your blood sugars ranging? 141-153   On insulin? Yes, patient is on 28 units of Tresiba daily   During the week, how often does your blood glucose drop below 70? Never   Are you checking your feet daily/regularly? Yes    Adherence Review: Is the patient currently on a STATIN medication? Yes  Is the patient currently on ACE/ARB medication? Yes  Does the patient have >5 day gap between last estimated fill dates? No,  CPP to review   Care Gaps: Last Annual Wellness Visit? Confirmed appointment scheduled with Dr. Henrene Pastor on Monday 11/04/2020@  9:15 AM.    Confirmed mammogram appointment for Monday, 11/11/2020 @ 9am.    Patient reported that she has Oncology appointment today for elevated WBC.   Star Rating Drugs:  Medication:    Last Fill:  Day Supply Atorvastatin 40mg /                07/28/2020  90DS      Losartan-HCTZ 100-25 mg.  07/28/2020  90DS  Patient stated that she just got a call this morning from [place] for refills.  She reported that she has a difficult time keeping up with her medications and she expressed interest in trying packaging her medications to see if it helps.   Marcine Matar, [place] Clinical Pharmacist Assistant

## 2020-10-28 DIAGNOSIS — G40309 Generalized idiopathic epilepsy and epileptic syndromes, not intractable, without status epilepticus: Secondary | ICD-10-CM | POA: Diagnosis not present

## 2020-10-30 DIAGNOSIS — G8929 Other chronic pain: Secondary | ICD-10-CM | POA: Diagnosis not present

## 2020-10-30 DIAGNOSIS — I10 Essential (primary) hypertension: Secondary | ICD-10-CM | POA: Diagnosis not present

## 2020-10-30 DIAGNOSIS — R519 Headache, unspecified: Secondary | ICD-10-CM | POA: Diagnosis not present

## 2020-10-30 DIAGNOSIS — E669 Obesity, unspecified: Secondary | ICD-10-CM | POA: Diagnosis not present

## 2020-10-30 DIAGNOSIS — E7800 Pure hypercholesterolemia, unspecified: Secondary | ICD-10-CM | POA: Diagnosis not present

## 2020-10-30 DIAGNOSIS — E1169 Type 2 diabetes mellitus with other specified complication: Secondary | ICD-10-CM | POA: Diagnosis not present

## 2020-10-30 DIAGNOSIS — G4489 Other headache syndrome: Secondary | ICD-10-CM | POA: Diagnosis not present

## 2020-10-30 DIAGNOSIS — G4733 Obstructive sleep apnea (adult) (pediatric): Secondary | ICD-10-CM | POA: Diagnosis not present

## 2020-10-30 DIAGNOSIS — R079 Chest pain, unspecified: Secondary | ICD-10-CM | POA: Diagnosis not present

## 2020-10-30 DIAGNOSIS — K219 Gastro-esophageal reflux disease without esophagitis: Secondary | ICD-10-CM | POA: Diagnosis not present

## 2020-10-30 DIAGNOSIS — G40909 Epilepsy, unspecified, not intractable, without status epilepticus: Secondary | ICD-10-CM | POA: Diagnosis not present

## 2020-10-31 DIAGNOSIS — R079 Chest pain, unspecified: Secondary | ICD-10-CM | POA: Diagnosis not present

## 2020-10-31 DIAGNOSIS — I10 Essential (primary) hypertension: Secondary | ICD-10-CM | POA: Diagnosis not present

## 2020-10-31 DIAGNOSIS — E1169 Type 2 diabetes mellitus with other specified complication: Secondary | ICD-10-CM | POA: Diagnosis not present

## 2020-11-01 DIAGNOSIS — E1169 Type 2 diabetes mellitus with other specified complication: Secondary | ICD-10-CM | POA: Diagnosis not present

## 2020-11-01 DIAGNOSIS — R079 Chest pain, unspecified: Secondary | ICD-10-CM | POA: Diagnosis not present

## 2020-11-01 DIAGNOSIS — I10 Essential (primary) hypertension: Secondary | ICD-10-CM | POA: Diagnosis not present

## 2020-11-04 ENCOUNTER — Other Ambulatory Visit: Payer: Self-pay

## 2020-11-04 ENCOUNTER — Ambulatory Visit (INDEPENDENT_AMBULATORY_CARE_PROVIDER_SITE_OTHER): Payer: Medicare HMO | Admitting: Legal Medicine

## 2020-11-04 ENCOUNTER — Encounter: Payer: Self-pay | Admitting: Legal Medicine

## 2020-11-04 VITALS — BP 140/78 | HR 79 | Temp 97.5°F | Ht 66.0 in | Wt 245.0 lb

## 2020-11-04 DIAGNOSIS — I152 Hypertension secondary to endocrine disorders: Secondary | ICD-10-CM | POA: Diagnosis not present

## 2020-11-04 DIAGNOSIS — E782 Mixed hyperlipidemia: Secondary | ICD-10-CM | POA: Diagnosis not present

## 2020-11-04 DIAGNOSIS — E1169 Type 2 diabetes mellitus with other specified complication: Secondary | ICD-10-CM

## 2020-11-04 DIAGNOSIS — R0789 Other chest pain: Secondary | ICD-10-CM

## 2020-11-04 DIAGNOSIS — E669 Obesity, unspecified: Secondary | ICD-10-CM | POA: Diagnosis not present

## 2020-11-04 DIAGNOSIS — K2100 Gastro-esophageal reflux disease with esophagitis, without bleeding: Secondary | ICD-10-CM | POA: Diagnosis not present

## 2020-11-04 DIAGNOSIS — I10 Essential (primary) hypertension: Secondary | ICD-10-CM | POA: Diagnosis not present

## 2020-11-04 DIAGNOSIS — E1159 Type 2 diabetes mellitus with other circulatory complications: Secondary | ICD-10-CM | POA: Diagnosis not present

## 2020-11-04 LAB — COMPREHENSIVE METABOLIC PANEL
ALT: 19 IU/L (ref 0–32)
AST: 13 IU/L (ref 0–40)
Albumin/Globulin Ratio: 1.5 (ref 1.2–2.2)
Albumin: 3.7 g/dL — ABNORMAL LOW (ref 3.8–4.8)
Alkaline Phosphatase: 58 IU/L (ref 44–121)
BUN/Creatinine Ratio: 21 (ref 12–28)
BUN: 17 mg/dL (ref 8–27)
Bilirubin Total: 0.2 mg/dL (ref 0.0–1.2)
CO2: 21 mmol/L (ref 20–29)
Calcium: 9.1 mg/dL (ref 8.7–10.3)
Chloride: 99 mmol/L (ref 96–106)
Creatinine, Ser: 0.81 mg/dL (ref 0.57–1.00)
Globulin, Total: 2.5 g/dL (ref 1.5–4.5)
Glucose: 187 mg/dL — ABNORMAL HIGH (ref 65–99)
Potassium: 4.4 mmol/L (ref 3.5–5.2)
Sodium: 137 mmol/L (ref 134–144)
Total Protein: 6.2 g/dL (ref 6.0–8.5)
eGFR: 82 mL/min/{1.73_m2} (ref >59)

## 2020-11-04 MED ORDER — ACCU-CHEK AVIVA PLUS W/DEVICE KIT: 1.0000 | PACK | Freq: Three times a day (TID) | 0 refills | Status: AC

## 2020-11-04 MED ORDER — PANTOPRAZOLE SODIUM 40 MG PO TBEC: 40.0000 mg | DELAYED_RELEASE_TABLET | Freq: Two times a day (BID) | ORAL | 1 refills | Status: DC

## 2020-11-04 NOTE — Progress Notes (Signed)
Established Patient Office Visit  Subjective:  Patient ID: Alicia Kelly, female    DOB: Apr 26, 1956  Age: 65 y.o. MRN: [id number]  CC:  Chief Complaint  Patient presents with   Hospitalization Follow-up   Transitions Of Care    Patient was admitted in [facility] on 10/31/2020    HPI Alicia Kelly presents for transition of care and reconciliation of medicines.  Patient admitted 10/31/2020 to [facility] for chest pain.  I was ruled and lexiscan was normal.  She was seen by Dr. Bettina Gavia.  She was discharged 11/01/2020. A1c was 8.1 in hospital  Patient present with type 2 diabetes.  Specifically, this is type 2, insulin requiring diabetes, complicated by pertension and hyperlipidemia.  Compliance with treatment has been good; patient take medicines as directed, maintains diet and exercise regimen, follows up as directed, and is keeping glucose diary.  Date of  diagnosis 2010.  Depression screen has been performed.Tobacco screen nonsmoker. Current medicines for diabetes tresiba 26 units.  Patient is on  losartanfor renal protection and atorvastatin for cholesterol control.  Patient performs foot exams daily and last ophthalmologic exam was no.   Past Medical History:  Diagnosis Date   Asthma    Calculus of gallbladder with chronic cholecystitis without obstruction 03/22/2017   Diabetes ([place])    Epilepsy ([place])    Hypertension    Sleep apnea     Past Surgical History:  Procedure Laterality Date   APPENDECTOMY     TONSILLECTOMY     TONSILLECTOMY      Family History  Problem Relation Age of Onset   Colon cancer Mother    Hypertension Father    Diabetes Father    Allergic rhinitis Sister    Hypertension Sister    Cancer Maternal Uncle    Cancer Maternal Grandmother    Cancer Paternal Grandmother    Diabetes Paternal Grandmother     Social History   Socioeconomic History   Marital status: Married    Spouse name: Not on file   Number of children: Not on file    Years of education: Not on file   Highest education level: Not on file  Occupational History   Not on file  Tobacco Use   Smoking status: Never   Smokeless tobacco: Never  Vaping Use   Vaping Use: Never used  Substance and Sexual Activity   Alcohol use: Not Currently   Drug use: Never   Sexual activity: Not on file  Other Topics Concern   Not on file  Social History Narrative   Not on file   Social Determinants of Health   Financial Resource Strain: Low Risk    Difficulty of Paying Living Expenses: Not hard at all  Food Insecurity: No Food Insecurity   Worried About Charity fundraiser in the Last Year: Never true   [place] in the Last Year: Never true  Transportation Needs: No Transportation Needs   Lack of Transportation (Medical): No   Lack of Transportation (Non-Medical): No  Physical Activity: Insufficiently Active   Days of Exercise per Week: 2 days   Minutes of Exercise per Session: 20 min  Stress: No Stress Concern Present   Feeling of Stress : Not at all  Social Connections: Moderately Isolated   Frequency of Communication with Friends and Family: More than three times a week   Frequency of Social Gatherings with Friends and Family: Never   Attends Religious Services: Never   Active Member of  Clubs or Organizations: No   Attends Archivist Meetings: Never   Marital Status: Married  Human resources officer Violence: Not At Risk   Fear of Current or Ex-Partner: No   Emotionally Abused: No   Physically Abused: No   Sexually Abused: No    Outpatient Medications Prior to Visit  Medication Sig Dispense Refill   ACCU-CHEK AVIVA PLUS test strip TEST BLOOD SUGAR THREE TIMES DAILY 300 strip 2   Accu-Chek Softclix Lancets lancets      atorvastatin (LIPITOR) 40 MG tablet Take 40 mg by mouth daily.      Blood Glucose Calibration (ACCU-CHEK AVIVA) SOLN      clotrimazole-betamethasone (LOTRISONE) cream APPLY 1 APPLICATION TOPICALLY 2 TIMES DAILY 30 g 3    cyclobenzaprine (FLEXERIL) 10 MG tablet Take 1 tablet (10 mg total) by mouth 3 (three) times daily as needed for muscle spasms. 30 tablet 0   doxycycline (VIBRA-TABS) 100 MG tablet Take 1 tablet (100 mg total) by mouth 2 (two) times daily. 20 tablet 0   fluconazole (DIFLUCAN) 100 MG tablet Take 1 tablet (100 mg total) by mouth daily. 7 tablet 0   glipiZIDE (GLUCOTROL) 5 MG tablet Take 5 mg by mouth 2 (two) times daily.     Insulin Pen Needle (PEN NEEDLES) 31G X 5 MM MISC 1 each by Does not apply route daily. 100 each 2   lacosamide (VIMPAT) 200 MG TABS tablet Take 300 mg by mouth 2 (two) times daily.     levETIRAcetam (KEPPRA) 1000 MG tablet Take 1,000 mg by mouth in the morning, at noon, and at bedtime.     losartan-hydrochlorothiazide (HYZAAR) 100-25 MG tablet Take 1 tablet by mouth daily.      montelukast (SINGULAIR) 10 MG tablet TAKE ONE TABLET BY MOUTH DAILY AS DIRECTED 30 tablet 2   TRESIBA FLEXTOUCH 100 UNIT/ML FlexTouch Pen Inject 26 Units into the skin daily. (Patient taking differently: Inject 28 Units into the skin daily.) 15 mL 5   levETIRAcetam (KEPPRA) 1000 MG tablet Take 1 tablet by mouth 3 (three) times daily.     omeprazole (PRILOSEC) 40 MG capsule Take 40 mg by mouth daily.     pantoprazole (PROTONIX) 40 MG tablet Take 40 mg by mouth 2 (two) times daily.     No facility-administered medications prior to visit.    Allergies  Allergen Reactions   Levofloxacin Other (See Comments)    Throat swelling   Lisinopril Hives   Nitrofurantoin Swelling    Throat swelling   Penicillins Anaphylaxis   Clarithromycin Other (See Comments)    seizures   Sulfamethoxazole-Trimethoprim     Causes seizures    ROS Review of Systems  Constitutional:  Negative for activity change and appetite change.  HENT:  Negative for congestion.   Eyes:  Negative for visual disturbance.  Respiratory:  Negative for chest tightness and shortness of breath.   Cardiovascular:  Negative for chest pain  and leg swelling.  Gastrointestinal:  Negative for abdominal distention and abdominal pain.  Endocrine: Negative for polyuria.  Genitourinary:  Negative for difficulty urinating.  Musculoskeletal:  Negative for arthralgias and back pain.  Neurological: Negative.   Psychiatric/Behavioral:  Negative for agitation.      Objective:    Physical Exam Vitals reviewed.  Constitutional:      Appearance: Normal appearance.  HENT:     Head: Normocephalic and atraumatic.     Right Ear: Tympanic membrane, ear canal and external ear normal.     Left  Ear: Tympanic membrane, ear canal and external ear normal.     Nose: Nose normal.     Mouth/Throat:     Mouth: Mucous membranes are moist.     Pharynx: Oropharynx is clear.  Eyes:     Extraocular Movements: Extraocular movements intact.     Conjunctiva/sclera: Conjunctivae normal.     Pupils: Pupils are equal, round, and reactive to light.  Cardiovascular:     Rate and Rhythm: Normal rate and regular rhythm.     Pulses: Normal pulses.     Heart sounds: Normal heart sounds. No murmur heard.   No gallop.  Pulmonary:     Effort: Pulmonary effort is normal. No respiratory distress.     Breath sounds: Normal breath sounds. No rales.  Abdominal:     General: Abdomen is flat. Bowel sounds are normal. There is no distension.     Palpations: Abdomen is soft.     Tenderness: There is no abdominal tenderness.  Musculoskeletal:        General: Normal range of motion.     Cervical back: Normal range of motion and neck supple.  Skin:    General: Skin is warm.     Capillary Refill: Capillary refill takes less than 2 seconds.  Neurological:     General: No focal deficit present.     Mental Status: She is alert and oriented to person, place, and time. Mental status is at baseline.  Psychiatric:        Mood and Affect: Mood normal.    BP 140/78   Pulse 79   Temp (!) 97.5 F (36.4 C)   Ht '5\' 6"'  (1.676 m)   Wt 245 lb (111.1 kg)   SpO2 98%   BMI  39.54 kg/m  Wt Readings from Last 3 Encounters:  11/04/20 245 lb (111.1 kg)  10/25/20 242 lb 14.4 oz (110.2 kg)  10/02/20 242 lb 12.8 oz (110.1 kg)     Health Maintenance Due  Topic Date Due   OPHTHALMOLOGY EXAM  Never done   HIV Screening  Never done   Hepatitis C Screening  Never done   PAP SMEAR-Modifier  Never done   MAMMOGRAM  Never done   Zoster Vaccines- Shingrix (1 of 2) Never done   COVID-19 Vaccine (4 - Booster for Moderna series) 09/13/2020    There are no preventive care reminders to display for this patient.  No results found for: TSH Lab Results  Component Value Date   WBC 9.5 10/25/2020   HGB 11.7 (A) 10/25/2020   HCT 35 (A) 10/25/2020   MCV 88 10/10/2020   PLT 389 10/25/2020   Lab Results  Component Value Date   NA 138 10/02/2020   K 4.8 10/02/2020   CO2 23 10/02/2020   GLUCOSE 221 (H) 10/02/2020   BUN 22 10/02/2020   CREATININE 0.83 10/02/2020   BILITOT 0.3 10/02/2020   ALKPHOS 85 10/02/2020   AST 9 10/02/2020   ALT 15 10/02/2020   PROT 7.1 10/02/2020   ALBUMIN 4.5 10/02/2020   CALCIUM 9.9 10/02/2020   EGFR 79 10/02/2020   Lab Results  Component Value Date   CHOL 156 10/02/2020   Lab Results  Component Value Date   HDL 61 10/02/2020   Lab Results  Component Value Date   LDLCALC 75 10/02/2020   Lab Results  Component Value Date   TRIG 110 10/02/2020   Lab Results  Component Value Date   CHOLHDL 2.6 10/02/2020   Lab Results  Component Value Date   HGBA1C 7.9 (H) 10/02/2020      Assessment & Plan:   Diagnoses and all orders for this visit: Other chest pain  Patient admitted for chest pain with negative workup, suspect GERD  Obesity, diabetes, and hypertension syndrome ([place]) An individual care plan for diabetes was established and reinforced today.  The patient's status was assessed using clinical findings on exam, labs and diagnostic testing. Patient success at meeting goals based on disease specific evidence-based  guidelines and found to be fair controlled. Medications were assessed and patient's understanding of the medical issues , including barriers were assessed. Recommend adherence to a diabetic diet, a graduated exercise program, HgbA1c level is checked quarterly, and urine microalbumin performed yearly .  Annual mono-filament sensation testing performed. Lower blood pressure and control hyperlipidemia is important. Get annual eye exams and annual flu shots and smoking cessation discussed.  Self management goals were discussed.   Essential hypertension -     Comprehensive metabolic panel An individual hypertension care plan was established and reinforced today.  The patient's status was assessed using clinical findings on exam and labs or diagnostic tests. The patient's success at meeting treatment goals on disease specific evidence-based guidelines and found to be well controlled. SELF MANAGEMENT: The patient and I together assessed ways to personally work towards obtaining the recommended goals. RECOMMENDATIONS: avoid decongestants found in common cold remedies, decrease consumption of alcohol, perform routine monitoring of BP with home BP cuff, exercise, reduction of dietary salt, take medicines as prescribed, try not to miss doses and quit smoking.  Regular exercise and maintaining a healthy weight is needed.  Stress reduction may help. A CLINICAL SUMMARY including written plan identify barriers to care unique to individual due to social or financial issues.  We attempt to mutually creat solutions for individual and family understanding.   Mixed hyperlipidemia AN INDIVIDUAL CARE PLAN for hyperlipidemia/ cholesterol was established and reinforced today.  The patient's status was assessed using clinical findings on exam, lab and other diagnostic tests. The patient's disease status was assessed based on evidence-based guidelines and found to be fair controlled. MEDICATIONS were reviewed. SELF MANAGEMENT  GOALS have been discussed and patient's success at attaining the goal of low cholesterol was assessed. RECOMMENDATION given include regular exercise 3 days a week and low cholesterol/low fat diet. CLINICAL SUMMARY including written plan to identify barriers unique to the patient due to social or economic  reasons was discussed.   Gastroesophageal reflux disease with esophagitis without hemorrhage -     pantoprazole (PROTONIX) 40 MG tablet; Take 1 tablet (40 mg total) by mouth 2 (two) times daily. Plan of care was formulated today.  She is doing well.  A plan of care was formulated using patient exam, tests and other sources to optimize care using evidence based information.  Recommend no smoking, no eating after supper, avoid fatty foods, elevate Head of bed, avoid tight fitting clothing.  Continue on pantoprazole.   Follow-up: Return in about 2 years (around 11/05/2022) for fasting for chronic.    Reinaldo Meeker, MD

## 2020-11-05 LAB — CALR + JAK2 E12-15 + MPL (REFLEXED)

## 2020-11-05 LAB — JAK2 V617F, W REFLEX TO CALR/E12/MPL

## 2020-11-05 NOTE — Progress Notes (Signed)
Glucose 187, kidney tests normal, low albumin, liver tests normal, potassium and sodium normal lp lp

## 2020-11-07 ENCOUNTER — Other Ambulatory Visit: Payer: Self-pay

## 2020-11-07 DIAGNOSIS — I152 Hypertension secondary to endocrine disorders: Secondary | ICD-10-CM

## 2020-11-07 DIAGNOSIS — E1169 Type 2 diabetes mellitus with other specified complication: Secondary | ICD-10-CM

## 2020-11-07 MED ORDER — ACCU-CHEK SOFTCLIX LANCETS MISC: 1.0000 | Freq: Three times a day (TID) | 2 refills | Status: DC

## 2020-11-07 MED ORDER — ACCU-CHEK AVIVA PLUS VI STRP: 1.0000 | ORAL_STRIP | Freq: Three times a day (TID) | 2 refills | Status: DC

## 2020-11-10 DIAGNOSIS — D72829 Elevated white blood cell count, unspecified: Secondary | ICD-10-CM | POA: Insufficient documentation

## 2020-11-11 ENCOUNTER — Telehealth: Payer: Self-pay | Admitting: Genetic Counselor

## 2020-11-11 ENCOUNTER — Ambulatory Visit
Admission: RE | Admit: 2020-11-11 | Discharge: 2020-11-11 | Disposition: A | Discharge status: Home / Self Care | Payer: Medicare HMO | Source: Ambulatory Visit | Attending: Legal Medicine | Admitting: Legal Medicine

## 2020-11-11 ENCOUNTER — Encounter: Payer: Self-pay | Admitting: Genetic Counselor

## 2020-11-11 ENCOUNTER — Other Ambulatory Visit: Payer: Self-pay

## 2020-11-11 DIAGNOSIS — Z1231 Encounter for screening mammogram for malignant neoplasm of breast: Secondary | ICD-10-CM | POA: Diagnosis not present

## 2020-11-11 NOTE — Telephone Encounter (Signed)
Scheduled genetic counseling appt at [facility] (virtual) due to [place] colon cancer.  Appt scheduled 8/16 at 9am.

## 2020-11-20 ENCOUNTER — Other Ambulatory Visit: Payer: Self-pay

## 2020-11-20 DIAGNOSIS — K2100 Gastro-esophageal reflux disease with esophagitis, without bleeding: Secondary | ICD-10-CM

## 2020-11-20 MED ORDER — GLIPIZIDE 5 MG PO TABS: 5.0000 mg | ORAL_TABLET | Freq: Two times a day (BID) | ORAL | 2 refills | Status: DC

## 2020-11-20 MED ORDER — PANTOPRAZOLE SODIUM 40 MG PO TBEC: 40.0000 mg | DELAYED_RELEASE_TABLET | Freq: Two times a day (BID) | ORAL | 1 refills | Status: DC

## 2020-12-02 ENCOUNTER — Other Ambulatory Visit: Payer: Self-pay

## 2020-12-02 MED ORDER — MONTELUKAST SODIUM 10 MG PO TABS: 10.0000 mg | ORAL_TABLET | Freq: Every day | ORAL | 2 refills | Status: DC

## 2020-12-03 DIAGNOSIS — K219 Gastro-esophageal reflux disease without esophagitis: Secondary | ICD-10-CM | POA: Diagnosis not present

## 2020-12-03 DIAGNOSIS — L309 Dermatitis, unspecified: Secondary | ICD-10-CM | POA: Diagnosis not present

## 2020-12-12 ENCOUNTER — Other Ambulatory Visit: Payer: Self-pay | Admitting: Legal Medicine

## 2020-12-12 DIAGNOSIS — L89102 Pressure ulcer of unspecified part of back, stage 2: Secondary | ICD-10-CM

## 2020-12-16 ENCOUNTER — Other Ambulatory Visit: Payer: Self-pay | Admitting: Legal Medicine

## 2020-12-16 ENCOUNTER — Telehealth: Payer: Self-pay

## 2020-12-16 MED ORDER — NYSTATIN-TRIAMCINOLONE 100000-0.1 UNIT/GM-% EX OINT: 1.0000 "application " | TOPICAL_OINTMENT | Freq: Two times a day (BID) | CUTANEOUS | 3 refills | Status: DC

## 2020-12-16 NOTE — Progress Notes (Unsigned)
?   Statin.

## 2020-12-16 NOTE — Telephone Encounter (Signed)
Pt calling requesting refill on nystatin. Medication not on current medication list. States she is using this under stomach due to redness.   [name], [place] 12/16/20 1:43 PM

## 2020-12-24 ENCOUNTER — Other Ambulatory Visit: Payer: Self-pay

## 2020-12-24 MED ORDER — NYSTATIN 100000 UNIT/GM EX CREA: TOPICAL_CREAM | Freq: Two times a day (BID) | CUTANEOUS | 2 refills | Status: DC

## 2020-12-31 ENCOUNTER — Inpatient Hospital Stay: Payer: Medicare HMO | Attending: Oncology | Admitting: Genetic Counselor

## 2021-01-01 ENCOUNTER — Encounter: Payer: Self-pay | Admitting: Legal Medicine

## 2021-01-01 ENCOUNTER — Other Ambulatory Visit: Payer: Self-pay

## 2021-01-01 ENCOUNTER — Ambulatory Visit (INDEPENDENT_AMBULATORY_CARE_PROVIDER_SITE_OTHER): Payer: Medicare HMO | Admitting: Legal Medicine

## 2021-01-01 VITALS — BP 150/90 | HR 75 | Temp 97.6°F | Resp 16 | Ht 66.0 in | Wt 247.0 lb

## 2021-01-01 DIAGNOSIS — R059 Cough, unspecified: Secondary | ICD-10-CM

## 2021-01-01 DIAGNOSIS — J0100 Acute maxillary sinusitis, unspecified: Secondary | ICD-10-CM

## 2021-01-01 LAB — POC COVID19 BINAXNOW: SARS Coronavirus 2 Ag: NEGATIVE

## 2021-01-01 MED ORDER — DOXYCYCLINE HYCLATE 100 MG PO TABS: 100.0000 mg | ORAL_TABLET | Freq: Two times a day (BID) | ORAL | 0 refills | Status: DC

## 2021-01-01 NOTE — Progress Notes (Signed)
Established Patient Office Visit  Subjective:  Patient ID: Alicia Kelly, female    DOB: 10-01-1955  Age: 65 y.o. MRN: [id number]  CC:  Chief Complaint  Patient presents with   Sinusitis   Nevus    HPI Alicia Kelly presents for sinusitis. One week of sinus drainage and pain.  No fever or chills.  Nonproductive cough.    Past Medical History:  Diagnosis Date   Asthma    Calculus of gallbladder with chronic cholecystitis without obstruction 03/22/2017   Diabetes ([place])    Epilepsy ([place])    Hypertension    Sleep apnea     Past Surgical History:  Procedure Laterality Date   APPENDECTOMY     TONSILLECTOMY     TONSILLECTOMY      Family History  Problem Relation Age of Onset   Colon cancer Mother        d. 86   Hypertension Father    Diabetes Father    Allergic rhinitis Sister    Hypertension Sister    Colon cancer Maternal Grandmother        dx 49s   Colon cancer Paternal Grandmother        dx 37s   Diabetes Paternal Grandmother    Cancer Maternal Uncle    Breast cancer Neg Hx     Social History   Socioeconomic History   Marital status: Married    Spouse name: Not on file   Number of children: Not on file   Years of education: Not on file   Highest education level: Not on file  Occupational History   Not on file  Tobacco Use   Smoking status: Never   Smokeless tobacco: Never  Vaping Use   Vaping Use: Never used  Substance and Sexual Activity   Alcohol use: Not Currently   Drug use: Never   Sexual activity: Not on file  Other Topics Concern   Not on file  Social History Narrative   Not on file   Social Determinants of Health   Financial Resource Strain: Low Risk    Difficulty of Paying Living Expenses: Not hard at all  Food Insecurity: No Food Insecurity   Worried About Charity fundraiser in the Last Year: Never true   [place] in the Last Year: Never true  Transportation Needs: No Transportation Needs   Lack of Transportation  (Medical): No   Lack of Transportation (Non-Medical): No  Physical Activity: Insufficiently Active   Days of Exercise per Week: 2 days   Minutes of Exercise per Session: 20 min  Stress: No Stress Concern Present   Feeling of Stress : Not at all  Social Connections: Moderately Isolated   Frequency of Communication with Friends and Family: More than three times a week   Frequency of Social Gatherings with Friends and Family: Never   Attends Religious Services: Never   Marine scientist or Organizations: No   Attends Music therapist: Never   Marital Status: Married  Human resources officer Violence: Not At Risk   Fear of Current or Ex-Partner: No   Emotionally Abused: No   Physically Abused: No   Sexually Abused: No    Outpatient Medications Prior to Visit  Medication Sig Dispense Refill   Accu-Chek Softclix Lancets lancets 1 each by Other route 3 (three) times daily. 100 each 2   atorvastatin (LIPITOR) 40 MG tablet Take 40 mg by mouth daily.      Blood Glucose Calibration (  ACCU-CHEK AVIVA) SOLN      Blood Glucose Monitoring Suppl (ACCU-CHEK AVIVA PLUS) w/Device KIT 1 each by Does not apply route 3 (three) times daily. 1 kit 0   clotrimazole-betamethasone (LOTRISONE) cream APPLY 1 APPLICATION TOPICALLY 2 TIMES DAILY 30 g 3   cyclobenzaprine (FLEXERIL) 10 MG tablet Take 1 tablet (10 mg total) by mouth 3 (three) times daily as needed for muscle spasms. 30 tablet 0   fluconazole (DIFLUCAN) 100 MG tablet Take 1 tablet (100 mg total) by mouth daily. 7 tablet 0   glipiZIDE (GLUCOTROL) 5 MG tablet Take 1 tablet (5 mg total) by mouth 2 (two) times daily. 180 tablet 2   glucose blood (ACCU-CHEK AVIVA PLUS) test strip 1 each by Other route 3 (three) times daily. Use as instructed 300 strip 2   Insulin Pen Needle (PEN NEEDLES) 31G X 5 MM MISC 1 each by Does not apply route daily. 100 each 2   lacosamide (VIMPAT) 200 MG TABS tablet Take 300 mg by mouth 2 (two) times daily.      levETIRAcetam (KEPPRA) 1000 MG tablet Take 1,000 mg by mouth in the morning, at noon, and at bedtime.     losartan-hydrochlorothiazide (HYZAAR) 100-25 MG tablet Take 1 tablet by mouth daily.      MODERNA COVID-19 VACCINE 100 MCG/0.5ML injection      montelukast (SINGULAIR) 10 MG tablet Take 1 tablet (10 mg total) by mouth daily. as directed 90 tablet 2   nystatin cream (MYCOSTATIN) Apply topically 2 (two) times daily. 30 g 2   pantoprazole (PROTONIX) 40 MG tablet Take 1 tablet (40 mg total) by mouth 2 (two) times daily. 90 tablet 1   TRESIBA FLEXTOUCH 100 UNIT/ML FlexTouch Pen Inject 26 Units into the skin daily. (Patient taking differently: Inject 28 Units into the skin daily.) 15 mL 5   doxycycline (VIBRA-TABS) 100 MG tablet Take 1 tablet (100 mg total) by mouth 2 (two) times daily. 20 tablet 0   No facility-administered medications prior to visit.    Allergies  Allergen Reactions   Levofloxacin Other (See Comments)    Throat swelling   Lisinopril Hives   Nitrofurantoin Swelling    Throat swelling   Penicillins Anaphylaxis   Clarithromycin Other (See Comments)    seizures   Sulfamethoxazole-Trimethoprim     Causes seizures    ROS Review of Systems  Constitutional:  Negative for activity change and appetite change.  HENT:  Positive for congestion, facial swelling and sinus pain.   Eyes:  Negative for visual disturbance.  Respiratory:  Positive for cough. Negative for chest tightness.   Cardiovascular:  Negative for chest pain and palpitations.  Gastrointestinal:  Negative for abdominal distention and abdominal pain.  Genitourinary: Negative.   Musculoskeletal:  Negative for arthralgias and back pain.     Objective:    Physical Exam Vitals reviewed.  Constitutional:      Appearance: Normal appearance. She is obese.  HENT:     Right Ear: Tympanic membrane, ear canal and external ear normal.     Left Ear: Tympanic membrane, ear canal and external ear normal.     Nose:  Congestion present.     Comments: Maxillary sinus pain    Mouth/Throat:     Mouth: Mucous membranes are moist.     Pharynx: No oropharyngeal exudate.  Eyes:     Extraocular Movements: Extraocular movements intact.     Conjunctiva/sclera: Conjunctivae normal.     Pupils: Pupils are equal, round, and reactive to  light.  Cardiovascular:     Rate and Rhythm: Normal rate and regular rhythm.     Pulses: Normal pulses.     Heart sounds: Normal heart sounds. No murmur heard.   No gallop.  Pulmonary:     Effort: Pulmonary effort is normal. No respiratory distress.     Breath sounds: Normal breath sounds. No wheezing.  Abdominal:     General: Abdomen is flat. Bowel sounds are normal. There is no distension.     Tenderness: There is no abdominal tenderness.  Skin:    Capillary Refill: Capillary refill takes less than 2 seconds.  Neurological:     General: No focal deficit present.     Mental Status: She is alert and oriented to person, place, and time.    BP (!) 150/90   Pulse 75   Temp 97.6 F (36.4 C)   Resp 16   Ht $R'5\' 6"'fR$  (1.676 m)   Wt 247 lb (112 kg)   SpO2 98%   BMI 39.87 kg/m  Wt Readings from Last 3 Encounters:  01/01/21 247 lb (112 kg)  11/04/20 245 lb (111.1 kg)  10/25/20 242 lb 14.4 oz (110.2 kg)     Health Maintenance Due  Topic Date Due   OPHTHALMOLOGY EXAM  Never done   HIV Screening  Never done   Hepatitis C Screening  Never done   PAP SMEAR-Modifier  Never done   Zoster Vaccines- Shingrix (1 of 2) Never done   INFLUENZA VACCINE  12/16/2020    There are no preventive care reminders to display for this patient.  No results found for: TSH Lab Results  Component Value Date   WBC 9.5 10/25/2020   HGB 11.7 (A) 10/25/2020   HCT 35 (A) 10/25/2020   MCV 88 10/10/2020   PLT 389 10/25/2020   Lab Results  Component Value Date   NA 137 11/04/2020   K 4.4 11/04/2020   CO2 21 11/04/2020   GLUCOSE 187 (H) 11/04/2020   BUN 17 11/04/2020   CREATININE 0.81  11/04/2020   BILITOT 0.2 11/04/2020   ALKPHOS 58 11/04/2020   AST 13 11/04/2020   ALT 19 11/04/2020   PROT 6.2 11/04/2020   ALBUMIN 3.7 (L) 11/04/2020   CALCIUM 9.1 11/04/2020   EGFR 82 11/04/2020   Lab Results  Component Value Date   CHOL 156 10/02/2020   Lab Results  Component Value Date   HDL 61 10/02/2020   Lab Results  Component Value Date   LDLCALC 75 10/02/2020   Lab Results  Component Value Date   TRIG 110 10/02/2020   Lab Results  Component Value Date   CHOLHDL 2.6 10/02/2020   Lab Results  Component Value Date   HGBA1C 7.9 (H) 10/02/2020      Assessment & Plan:   Problem List Items Addressed This Visit   None Visit Diagnoses     Cough    -  Primary   Relevant Orders   POC COVID-19 (Completed) Patient has Covid exposure but negative test    Acute non-recurrent maxillary sinusitis       Relevant Medications   doxycycline (VIBRA-TABS) 100 MG tablet Treat acute sinusitis with doxycycline for 10 days, she is not allergic to this       Meds ordered this encounter  Medications   doxycycline (VIBRA-TABS) 100 MG tablet    Sig: Take 1 tablet (100 mg total) by mouth 2 (two) times daily.    Dispense:  20 tablet  Refill:  0    Follow-up: Return if symptoms worsen or fail to improve.    Reinaldo Meeker, MD

## 2021-01-07 ENCOUNTER — Ambulatory Visit: Payer: Medicare HMO | Admitting: Legal Medicine

## 2021-01-10 DIAGNOSIS — H524 Presbyopia: Secondary | ICD-10-CM | POA: Diagnosis not present

## 2021-01-15 ENCOUNTER — Ambulatory Visit (INDEPENDENT_AMBULATORY_CARE_PROVIDER_SITE_OTHER): Payer: Medicare HMO | Admitting: Legal Medicine

## 2021-01-15 ENCOUNTER — Encounter: Payer: Self-pay | Admitting: Legal Medicine

## 2021-01-15 ENCOUNTER — Other Ambulatory Visit: Payer: Self-pay

## 2021-01-15 VITALS — BP 118/80 | HR 66 | Temp 96.2°F | Resp 15 | Ht 66.0 in | Wt 250.0 lb

## 2021-01-15 DIAGNOSIS — E1159 Type 2 diabetes mellitus with other circulatory complications: Secondary | ICD-10-CM

## 2021-01-15 DIAGNOSIS — Z6841 Body Mass Index (BMI) 40.0 and over, adult: Secondary | ICD-10-CM | POA: Diagnosis not present

## 2021-01-15 DIAGNOSIS — E1169 Type 2 diabetes mellitus with other specified complication: Secondary | ICD-10-CM

## 2021-01-15 DIAGNOSIS — E669 Obesity, unspecified: Secondary | ICD-10-CM

## 2021-01-15 DIAGNOSIS — E782 Mixed hyperlipidemia: Secondary | ICD-10-CM

## 2021-01-15 DIAGNOSIS — G4733 Obstructive sleep apnea (adult) (pediatric): Secondary | ICD-10-CM | POA: Diagnosis not present

## 2021-01-15 DIAGNOSIS — I152 Hypertension secondary to endocrine disorders: Secondary | ICD-10-CM

## 2021-01-15 DIAGNOSIS — I10 Essential (primary) hypertension: Secondary | ICD-10-CM | POA: Diagnosis not present

## 2021-01-15 DIAGNOSIS — G40209 Localization-related (focal) (partial) symptomatic epilepsy and epileptic syndromes with complex partial seizures, not intractable, without status epilepticus: Secondary | ICD-10-CM | POA: Diagnosis not present

## 2021-01-15 DIAGNOSIS — K2100 Gastro-esophageal reflux disease with esophagitis, without bleeding: Secondary | ICD-10-CM | POA: Diagnosis not present

## 2021-01-15 NOTE — Progress Notes (Signed)
Acute Office Visit  Subjective:    Patient ID: Alicia Kelly, female    DOB: December 28, 1955, 65 y.o.   MRN: [id number]  Chief Complaint  Patient presents with   Diabetes   Hypertension   Hyperlipidemia    HPI Patient is in today for chronic visit  Patient present with type 2 diabetes.  Specifically, this is type 2, nsulin requiring diabetes, complicated by hypertension and obesity.  Compliance with treatment has been good; patient take medicines as directed, maintains diet and exercise regimen, follows up as directed, and is keeping glucose diary.  Date of  diagnosis 2010.  Depression screen has been performed.Tobacco screen nonsmoker. Current medicines for diabetes tresiba 26, glipizide  Patient is on losrtan for renal protection and atorvastatin for cholesterol control.  Patient performs foot exams daily and last ophthalmologic exam was yes.   Patient presents for follow up of hypertension.  Patient tolerating losrtan/HCTZ well with side effects.  Patient was diagnosed with hypertension 2010 so has been treated for hypertension for 10 years.Patient is working on maintaining diet and exercise regimen and follows up as directed. Complication include none  Patient presents with hyperlipidemia.  Compliance with treatment has been good; patient takes medicines as directed, maintains low cholesterol diet, follows up as directed, and maintains exercise regimen.  Patient is using atorvastatin without problems. Marland Kitchen   Ulcers have healed  Past Medical History:  Diagnosis Date   Asthma    Calculus of gallbladder with chronic cholecystitis without obstruction 03/22/2017   Diabetes ([place])    Epilepsy ([place])    Hypertension    Sleep apnea     Past Surgical History:  Procedure Laterality Date   APPENDECTOMY     TONSILLECTOMY     TONSILLECTOMY      Family History  Problem Relation Age of Onset   Colon cancer Mother        d. 77   Hypertension Father    Diabetes Father    Allergic rhinitis  Sister    Hypertension Sister    Colon cancer Maternal Grandmother        dx 70s   Colon cancer Paternal Grandmother        dx 69s   Diabetes Paternal Grandmother    Cancer Maternal Uncle    Breast cancer Neg Hx     Social History   Socioeconomic History   Marital status: Married    Spouse name: Not on file   Number of children: Not on file   Years of education: Not on file   Highest education level: Not on file  Occupational History   Not on file  Tobacco Use   Smoking status: Never   Smokeless tobacco: Never  Vaping Use   Vaping Use: Never used  Substance and Sexual Activity   Alcohol use: Not Currently   Drug use: Never   Sexual activity: Not on file  Other Topics Concern   Not on file  Social History Narrative   Not on file   Social Determinants of Health   Financial Resource Strain: Low Risk    Difficulty of Paying Living Expenses: Not hard at all  Food Insecurity: No Food Insecurity   Worried About Charity fundraiser in the Last Year: Never true   [place] in the Last Year: Never true  Transportation Needs: No Transportation Needs   Lack of Transportation (Medical): No   Lack of Transportation (Non-Medical): No  Physical Activity: Insufficiently Active   Days  of Exercise per Week: 2 days   Minutes of Exercise per Session: 20 min  Stress: No Stress Concern Present   Feeling of Stress : Not at all  Social Connections: Moderately Isolated   Frequency of Communication with Friends and Family: More than three times a week   Frequency of Social Gatherings with Friends and Family: Never   Attends Religious Services: Never   Marine scientist or Organizations: No   Attends Music therapist: Never   Marital Status: Married  Human resources officer Violence: Not At Risk   Fear of Current or Ex-Partner: No   Emotionally Abused: No   Physically Abused: No   Sexually Abused: No    Outpatient Medications Prior to Visit  Medication Sig  Dispense Refill   Accu-Chek Softclix Lancets lancets 1 each by Other route 3 (three) times daily. 100 each 2   atorvastatin (LIPITOR) 40 MG tablet Take 40 mg by mouth daily.      Blood Glucose Calibration (ACCU-CHEK AVIVA) SOLN      Blood Glucose Monitoring Suppl (ACCU-CHEK AVIVA PLUS) w/Device KIT 1 each by Does not apply route 3 (three) times daily. 1 kit 0   clotrimazole-betamethasone (LOTRISONE) cream APPLY 1 APPLICATION TOPICALLY 2 TIMES DAILY 30 g 3   cyclobenzaprine (FLEXERIL) 10 MG tablet Take 1 tablet (10 mg total) by mouth 3 (three) times daily as needed for muscle spasms. 30 tablet 0   doxycycline (VIBRA-TABS) 100 MG tablet Take 1 tablet (100 mg total) by mouth 2 (two) times daily. 20 tablet 0   fluconazole (DIFLUCAN) 100 MG tablet Take 1 tablet (100 mg total) by mouth daily. 7 tablet 0   glipiZIDE (GLUCOTROL) 5 MG tablet Take 1 tablet (5 mg total) by mouth 2 (two) times daily. 180 tablet 2   glucose blood (ACCU-CHEK AVIVA PLUS) test strip 1 each by Other route 3 (three) times daily. Use as instructed 300 strip 2   Insulin Pen Needle (PEN NEEDLES) 31G X 5 MM MISC 1 each by Does not apply route daily. 100 each 2   lacosamide (VIMPAT) 200 MG TABS tablet Take 300 mg by mouth 2 (two) times daily.     levETIRAcetam (KEPPRA) 1000 MG tablet Take 1,000 mg by mouth in the morning, at noon, and at bedtime.     losartan-hydrochlorothiazide (HYZAAR) 100-25 MG tablet Take 1 tablet by mouth daily.      MODERNA COVID-19 VACCINE 100 MCG/0.5ML injection      montelukast (SINGULAIR) 10 MG tablet Take 1 tablet (10 mg total) by mouth daily. as directed 90 tablet 2   nystatin cream (MYCOSTATIN) Apply topically 2 (two) times daily. 30 g 2   pantoprazole (PROTONIX) 40 MG tablet Take 1 tablet (40 mg total) by mouth 2 (two) times daily. 90 tablet 1   TRESIBA FLEXTOUCH 100 UNIT/ML FlexTouch Pen Inject 26 Units into the skin daily. (Patient taking differently: Inject 28 Units into the skin daily.) 15 mL 5   No  facility-administered medications prior to visit.    Allergies  Allergen Reactions   Levofloxacin Other (See Comments)    Throat swelling   Lisinopril Hives   Nitrofurantoin Swelling    Throat swelling   Penicillins Anaphylaxis   Clarithromycin Other (See Comments)    seizures   Sulfamethoxazole-Trimethoprim     Causes seizures    Review of Systems  Constitutional:  Negative for activity change and appetite change.  HENT:  Negative for congestion.   Eyes:  Negative for visual  disturbance.  Respiratory:  Negative for chest tightness and shortness of breath.   Cardiovascular:  Negative for chest pain, palpitations and leg swelling.  Gastrointestinal:  Negative for abdominal distention and abdominal pain.  Endocrine: Positive for polyuria.  Genitourinary:  Negative for difficulty urinating and dysuria.  Musculoskeletal:  Negative for arthralgias and back pain.  Skin: Negative.   Neurological: Negative.   Psychiatric/Behavioral: Negative.        Objective:    Physical Exam Vitals reviewed.  Constitutional:      General: She is not in acute distress.    Appearance: Normal appearance. She is obese.  HENT:     Head: Normocephalic and atraumatic.     Right Ear: Tympanic membrane normal.     Left Ear: Tympanic membrane normal.     Nose: Nose normal.     Mouth/Throat:     Mouth: Mucous membranes are moist.     Pharynx: Oropharynx is clear.  Eyes:     Extraocular Movements: Extraocular movements intact.     Conjunctiva/sclera: Conjunctivae normal.     Pupils: Pupils are equal, round, and reactive to light.  Cardiovascular:     Rate and Rhythm: Normal rate and regular rhythm.     Pulses: Normal pulses.     Heart sounds: Normal heart sounds. No murmur heard.   No gallop.  Pulmonary:     Effort: Pulmonary effort is normal. No respiratory distress.     Breath sounds: No wheezing.  Abdominal:     General: Abdomen is flat. Bowel sounds are normal. There is no distension.      Palpations: Abdomen is soft.     Tenderness: There is no abdominal tenderness.  Musculoskeletal:        General: Normal range of motion.     Cervical back: Normal range of motion.  Skin:    General: Skin is warm.     Capillary Refill: Capillary refill takes less than 2 seconds.  Neurological:     General: No focal deficit present.     Mental Status: She is alert and oriented to person, place, and time. Mental status is at baseline.     Sensory: Sensory deficit (feet) present.     Gait: Gait normal.     Deep Tendon Reflexes: Reflexes normal.  Psychiatric:        Mood and Affect: Mood normal.        Thought Content: Thought content normal.    BP 118/80   Pulse 66   Temp (!) 96.2 F (35.7 C)   Resp 15   Ht '5\' 6"'  (1.676 m)   Wt 250 lb (113.4 kg)   LMP  (LMP Unknown)   SpO2 99%   BMI 40.35 kg/m  Wt Readings from Last 3 Encounters:  01/15/21 250 lb (113.4 kg)  01/01/21 247 lb (112 kg)  11/04/20 245 lb (111.1 kg)   Diabetic Foot Exam - Simple   Simple Foot Form Diabetic Foot exam was performed with the following findings: Yes 01/15/2021 10:32 AM  Visual Inspection See comments: Yes Sensation Testing See comments: Yes Pulse Check Posterior Tibialis and Dorsalis pulse intact bilaterally: Yes Comments Bunions, bilat, no sensation feet      Health Maintenance Due  Topic Date Due   OPHTHALMOLOGY EXAM  Never done   HIV Screening  Never done   Hepatitis C Screening  Never done   PAP SMEAR-Modifier  Never done   Zoster Vaccines- Shingrix (1 of 2) Never done   INFLUENZA  VACCINE  12/16/2020    There are no preventive care reminders to display for this patient.   No results found for: TSH Lab Results  Component Value Date   WBC 9.5 10/25/2020   HGB 11.7 (A) 10/25/2020   HCT 35 (A) 10/25/2020   MCV 88 10/10/2020   PLT 389 10/25/2020   Lab Results  Component Value Date   NA 137 11/04/2020   K 4.4 11/04/2020   CO2 21 11/04/2020   GLUCOSE 187 (H) 11/04/2020    BUN 17 11/04/2020   CREATININE 0.81 11/04/2020   BILITOT 0.2 11/04/2020   ALKPHOS 58 11/04/2020   AST 13 11/04/2020   ALT 19 11/04/2020   PROT 6.2 11/04/2020   ALBUMIN 3.7 (L) 11/04/2020   CALCIUM 9.1 11/04/2020   EGFR 82 11/04/2020   Lab Results  Component Value Date   CHOL 156 10/02/2020   Lab Results  Component Value Date   HDL 61 10/02/2020   Lab Results  Component Value Date   LDLCALC 75 10/02/2020   Lab Results  Component Value Date   TRIG 110 10/02/2020   Lab Results  Component Value Date   CHOLHDL 2.6 10/02/2020   Lab Results  Component Value Date   HGBA1C 7.9 (H) 10/02/2020       Assessment & Plan:  1. Obesity, diabetes, and hypertension syndrome ([place]) - Hemoglobin A1c An individual care plan for diabetes was established and reinforced today.  The patient's status was assessed using clinical findings on exam, labs and diagnostic testing. Patient success at meeting goals based on disease specific evidence-based guidelines and found to be good controlled. Medications were assessed and patient's understanding of the medical issues , including barriers were assessed. Recommend adherence to a diabetic diet, a graduated exercise program, HgbA1c level is checked quarterly, and urine microalbumin performed yearly .  Annual mono-filament sensation testing performed. Lower blood pressure and control hyperlipidemia is important. Get annual eye exams and annual flu shots and smoking cessation discussed.  Self management goals were discussed.   2. Essential hypertension - Comprehensive metabolic panel - CBC with Differential/Platelet An individual hypertension care plan was established and reinforced today.  The patient's status was assessed using clinical findings on exam and labs or diagnostic tests. The patient's success at meeting treatment goals on disease specific evidence-based guidelines and found to be well controlled. SELF MANAGEMENT: The patient and I together  assessed ways to personally work towards obtaining the recommended goals. RECOMMENDATIONS: avoid decongestants found in common cold remedies, decrease consumption of alcohol, perform routine monitoring of BP with home BP cuff, exercise, reduction of dietary salt, take medicines as prescribed, try not to miss doses and quit smoking.  Regular exercise and maintaining a healthy weight is needed.  Stress reduction may help. A CLINICAL SUMMARY including written plan identify barriers to care unique to individual due to social or financial issues.  We attempt to mutually creat solutions for individual and family understanding.   3. Mixed hyperlipidemia - Lipid panel AN INDIVIDUAL CARE PLAN for hyperlipidemia/ cholesterol was established and reinforced today.  The patient's status was assessed using clinical findings on exam, lab and other diagnostic tests. The patient's disease status was assessed based on evidence-based guidelines and found to be fair controlled. MEDICATIONS were reviewed. SELF MANAGEMENT GOALS have been discussed and patient's success at attaining the goal of low cholesterol was assessed. RECOMMENDATION given include regular exercise 3 days a week and low cholesterol/low fat diet. CLINICAL SUMMARY including written plan to  identify barriers unique to the patient due to social or economic  reasons was discussed.   4. Gastroesophageal reflux disease with esophagitis without hemorrhage Plan of care was formulated today.  he is doing well.  A plan of care was formulated using patient exam, tests and other sources to optimize care using evidence based information.  Recommend no smoking, no eating after supper, avoid fatty foods, elevate Head of bed, avoid tight fitting clothing.  Continue on pantoprazole.   5. Partial symptomatic epilepsy with complex partial seizures, not intractable, without status epilepticus ([place]) Patient has not had any seizures, he remains on medicines recently   6.  Obstructive sleep apnea syndrome Using CPAP consistently every night and medically benefiting from its use.   7. BMI 40.0-44.9, adult [facility]) An individualize plan was formulated for obesity using patient history and physical exam to encourage weight loss.  An evidence based program was formulated.  Patient is to cut portion size with meals and to plan physical exercise 3 days a week at least 20 minutes.  Weight watchers and other programs are helpful.  Planned amount of weight loss 10 lbs.   8. Morbid obesity ([place]) An individualize plan was formulated for obesity using patient history and physical exam to encourage weight loss.  An evidence based program was formulated.  Patient is to cut portion size with meals and to plan physical exercise 3 days a week at least 20 minutes.  Weight watchers and other programs are helpful.  Planned amount of weight loss 10 lbs.        Orders Placed This Encounter  Procedures   Comprehensive metabolic panel   Hemoglobin A1c   Lipid panel   CBC with Differential/Platelet     I spent 40mnutes dedicated to the care of this patient on the date of this encounter to include face-to-face time with the patient, as well as: review records  Follow-up: Return in about 4 months (around 05/17/2021) for fasting.  An After Visit Summary was printed and given to the patient.  LReinaldo Meeker MD [facility] ([phone]

## 2021-01-16 LAB — CBC WITH DIFFERENTIAL/PLATELET
Basophils Absolute: 0 10*3/uL (ref 0.0–0.2)
Basos: 0 %
EOS (ABSOLUTE): 0.3 10*3/uL (ref 0.0–0.4)
Eos: 3 %
Hematocrit: 35.2 % (ref 34.0–46.6)
Hemoglobin: 11.2 g/dL (ref 11.1–15.9)
Immature Grans (Abs): 0 10*3/uL (ref 0.0–0.1)
Immature Granulocytes: 0 %
Lymphocytes Absolute: 1.7 10*3/uL (ref 0.7–3.1)
Lymphs: 16 %
MCH: 27.9 pg (ref 26.6–33.0)
MCHC: 31.8 g/dL (ref 31.5–35.7)
MCV: 88 fL (ref 79–97)
Monocytes Absolute: 0.8 10*3/uL (ref 0.1–0.9)
Monocytes: 8 %
Neutrophils Absolute: 7.5 10*3/uL — ABNORMAL HIGH (ref 1.4–7.0)
Neutrophils: 73 %
Platelets: 438 10*3/uL (ref 150–450)
RBC: 4.01 x10E6/uL (ref 3.77–5.28)
RDW: 12.6 % (ref 11.7–15.4)
WBC: 10.3 10*3/uL (ref 3.4–10.8)

## 2021-01-16 LAB — COMPREHENSIVE METABOLIC PANEL
ALT: 21 IU/L (ref 0–32)
AST: 18 IU/L (ref 0–40)
Albumin/Globulin Ratio: 1.8 (ref 1.2–2.2)
Albumin: 4.2 g/dL (ref 3.8–4.8)
Alkaline Phosphatase: 83 IU/L (ref 44–121)
BUN/Creatinine Ratio: 21 (ref 12–28)
BUN: 17 mg/dL (ref 8–27)
Bilirubin Total: 0.3 mg/dL (ref 0.0–1.2)
CO2: 24 mmol/L (ref 20–29)
Calcium: 9.6 mg/dL (ref 8.7–10.3)
Chloride: 98 mmol/L (ref 96–106)
Creatinine, Ser: 0.81 mg/dL (ref 0.57–1.00)
Globulin, Total: 2.3 g/dL (ref 1.5–4.5)
Glucose: 257 mg/dL — ABNORMAL HIGH (ref 65–99)
Potassium: 4.7 mmol/L (ref 3.5–5.2)
Sodium: 136 mmol/L (ref 134–144)
Total Protein: 6.5 g/dL (ref 6.0–8.5)
eGFR: 81 mL/min/{1.73_m2} (ref >59)

## 2021-01-16 LAB — LIPID PANEL
Chol/HDL Ratio: 2.9 ratio (ref 0.0–4.4)
Cholesterol, Total: 153 mg/dL (ref 100–199)
HDL: 52 mg/dL (ref >39)
LDL Chol Calc (NIH): 76 mg/dL (ref 0–99)
Triglycerides: 143 mg/dL (ref 0–149)
VLDL Cholesterol Cal: 25 mg/dL (ref 5–40)

## 2021-01-16 LAB — HEMOGLOBIN A1C
Est. average glucose Bld gHb Est-mCnc: 209 mg/dL
Hgb A1c MFr Bld: 8.9 % — ABNORMAL HIGH (ref 4.8–5.6)

## 2021-01-16 LAB — CARDIOVASCULAR RISK ASSESSMENT

## 2021-01-16 NOTE — Progress Notes (Signed)
Glucose 257, kidney tests normal, liver tests normal, A1c 8.9 very high, cholesterol normal, cbc normal, I recommend starting ozempic to lower diabetes sugars.can start with nurse visit lp

## 2021-01-22 ENCOUNTER — Ambulatory Visit (INDEPENDENT_AMBULATORY_CARE_PROVIDER_SITE_OTHER): Payer: Medicare HMO

## 2021-01-22 ENCOUNTER — Other Ambulatory Visit: Payer: Self-pay | Admitting: Legal Medicine

## 2021-01-22 ENCOUNTER — Other Ambulatory Visit: Payer: Self-pay

## 2021-01-22 DIAGNOSIS — E1169 Type 2 diabetes mellitus with other specified complication: Secondary | ICD-10-CM

## 2021-01-22 DIAGNOSIS — E669 Obesity, unspecified: Secondary | ICD-10-CM

## 2021-01-22 DIAGNOSIS — E1159 Type 2 diabetes mellitus with other circulatory complications: Secondary | ICD-10-CM

## 2021-01-22 DIAGNOSIS — K2100 Gastro-esophageal reflux disease with esophagitis, without bleeding: Secondary | ICD-10-CM

## 2021-01-22 DIAGNOSIS — I152 Hypertension secondary to endocrine disorders: Secondary | ICD-10-CM | POA: Diagnosis not present

## 2021-01-22 MED ORDER — OZEMPIC (0.25 OR 0.5 MG/DOSE) 2 MG/1.5ML ~~LOC~~ SOPN: 0.5000 mg | PEN_INJECTOR | SUBCUTANEOUS | 3 refills | Status: DC

## 2021-01-22 NOTE — Progress Notes (Signed)
Patient will start a new prescription of Ozempic due to her Diabetes being uncontrolled. Dr. Henrene Pastor wants [place] to teach her how to use ozempic. I explained her how to put the dose in the pen, how to put on the needle and the procedure to inject the medicine. I also mentioned the places on the body that she can inject the medication. I told her this medication is given weekly. Patient and significant other expressed verbal understanding. I recommended to call us if they have any questions. Dr. Henrene Pastor sent prescription to the pharmacy.

## 2021-01-27 ENCOUNTER — Other Ambulatory Visit: Payer: Self-pay

## 2021-01-27 MED ORDER — BETAMETHASONE DIPROPIONATE 0.05 % EX CREA: TOPICAL_CREAM | Freq: Two times a day (BID) | CUTANEOUS | 2 refills | Status: DC

## 2021-01-29 DIAGNOSIS — K219 Gastro-esophageal reflux disease without esophagitis: Secondary | ICD-10-CM | POA: Diagnosis not present

## 2021-02-05 ENCOUNTER — Other Ambulatory Visit: Payer: Self-pay | Admitting: Legal Medicine

## 2021-02-05 DIAGNOSIS — E669 Obesity, unspecified: Secondary | ICD-10-CM

## 2021-02-05 DIAGNOSIS — E1159 Type 2 diabetes mellitus with other circulatory complications: Secondary | ICD-10-CM

## 2021-02-05 NOTE — Telephone Encounter (Signed)
Refill sent to pharmacy.   

## 2021-02-08 DIAGNOSIS — R21 Rash and other nonspecific skin eruption: Secondary | ICD-10-CM | POA: Diagnosis not present

## 2021-02-08 DIAGNOSIS — G4089 Other seizures: Secondary | ICD-10-CM | POA: Diagnosis not present

## 2021-02-08 DIAGNOSIS — L249 Irritant contact dermatitis, unspecified cause: Secondary | ICD-10-CM | POA: Diagnosis not present

## 2021-02-08 DIAGNOSIS — L259 Unspecified contact dermatitis, unspecified cause: Secondary | ICD-10-CM | POA: Diagnosis not present

## 2021-02-24 ENCOUNTER — Other Ambulatory Visit: Payer: Self-pay

## 2021-02-24 MED ORDER — LACOSAMIDE 200 MG PO TABS: 300.0000 mg | ORAL_TABLET | Freq: Two times a day (BID) | ORAL | 1 refills | Status: DC

## 2021-02-24 MED ORDER — ATORVASTATIN CALCIUM 40 MG PO TABS: 40.0000 mg | ORAL_TABLET | Freq: Every day | ORAL | 1 refills | Status: DC

## 2021-02-24 MED ORDER — LOSARTAN POTASSIUM-HCTZ 100-25 MG PO TABS: 1.0000 | ORAL_TABLET | Freq: Every day | ORAL | 1 refills | Status: DC

## 2021-03-10 ENCOUNTER — Telehealth: Payer: Self-pay

## 2021-03-10 NOTE — Chronic Care Management (AMB) (Signed)
Chronic Care Management Pharmacy Assistant   Name: Alicia Kelly  MRN: [id number] DOB: 23-Dec-1955   Reason for Encounter: Disease State call for lipids     Recent office visits:  01/22/21 Orders Only. Started Ozempic 47m/1.5ml inject 0.5 into skin once weekly.   01/15/21 PReinaldo MeekerMD. Seen of obesity/diabetes. No med changes.  01/01/21 PReinaldo MeekerMD. Seen for cough. No med changes.   12/16/20 Orders Only. D/C Mycolog 1 application 2 times daily due to change in therapy.  11/04/20 PReinaldo MeekerMD. Seen for hospitalization follow up. D/C Omeprazole 40 mg daily due to change in therapy.  Recent consult visits:  10/28/20 (Neurology) FMaurie BoettcherMD. Seen for Generalized Epilepsy. Ordered Vimpat 200 mg take 1.5  2 times daily. Ordered Keppra 1000 mg 1 tablet three times daily.  10/25/20 (Oncology) LLavera GuiseMD. Seen for Leukocytosis. No med changes.  Hospital visits:  615/22-10/1720 ED visit. No notes available   Medications: Outpatient Encounter Medications as of 03/10/2021  Medication Sig   Accu-Chek Softclix Lancets lancets TEST BLOOD SUGAR THREE TIMES DAILY   atorvastatin (LIPITOR) 40 MG tablet Take 1 tablet (40 mg total) by mouth daily.   betamethasone dipropionate 0.05 % cream Apply topically 2 (two) times daily.   Blood Glucose Calibration (ACCU-CHEK AVIVA) SOLN    Blood Glucose Monitoring Suppl (ACCU-CHEK AVIVA PLUS) w/Device KIT 1 each by Does not apply route 3 (three) times daily.   clotrimazole-betamethasone (LOTRISONE) cream APPLY 1 APPLICATION TOPICALLY 2 TIMES DAILY   cyclobenzaprine (FLEXERIL) 10 MG tablet Take 1 tablet (10 mg total) by mouth 3 (three) times daily as needed for muscle spasms.   doxycycline (VIBRA-TABS) 100 MG tablet Take 1 tablet (100 mg total) by mouth 2 (two) times daily.   fluconazole (DIFLUCAN) 100 MG tablet Take 1 tablet (100 mg total) by mouth daily.   glipiZIDE (GLUCOTROL) 5 MG tablet Take 1 tablet (5 mg total) by mouth 2  (two) times daily.   glucose blood (ACCU-CHEK AVIVA PLUS) test strip 1 each by Other route 3 (three) times daily. Use as instructed   Insulin Pen Needle (PEN NEEDLES) 31G X 5 MM MISC 1 each by Does not apply route daily.   lacosamide (VIMPAT) 200 MG TABS tablet Take 1.5 tablets (300 mg total) by mouth 2 (two) times daily.   levETIRAcetam (KEPPRA) 1000 MG tablet Take 1,000 mg by mouth in the morning, at noon, and at bedtime.   losartan-hydrochlorothiazide (HYZAAR) 100-25 MG tablet Take 1 tablet by mouth daily.   MODERNA COVID-19 VACCINE 100 MCG/0.5ML injection    montelukast (SINGULAIR) 10 MG tablet Take 1 tablet (10 mg total) by mouth daily. as directed   nystatin cream (MYCOSTATIN) Apply topically 2 (two) times daily.   pantoprazole (PROTONIX) 40 MG tablet TAKE 1 TABLET TWICE DAILY   Semaglutide,0.25 or 0.5MG/DOS, (OZEMPIC, 0.25 OR 0.5 MG/DOSE,) 2 MG/1.5ML SOPN Inject 0.5 mg into the skin once a week.   TRESIBA FLEXTOUCH 100 UNIT/ML FlexTouch Pen Inject 26 Units into the skin daily. (Patient taking differently: Inject 28 Units into the skin daily.)   No facility-administered encounter medications on file as of 03/10/2021.   Lipid Panel    Component Value Date/Time   CHOL 153 01/15/2021 1037   TRIG 143 01/15/2021 1037   HDL 52 01/15/2021 1037   LDLCALC 76 01/15/2021 1037    10-year ASCVD risk score: The 10-year ASCVD risk score (Arnett DK, et al., 2019) is: 9.5%   Values used to calculate the  score:     Age: 65 years     Sex: Female     Is Non-Hispanic African American: No     Diabetic: Yes     Tobacco smoker: No     Systolic Blood Pressure: 047 mmHg     Is BP treated: Yes     HDL Cholesterol: 52 mg/dL     Total Cholesterol: 153 mg/dL  Current antihyperlipidemic regimen:  Atorvastatin 40 mg daily  ASCVD risk enhancing conditions: HTN and DM   What recent interventions/DTPs have been made by any provider to improve Cholesterol control since last CPP Visit: Pt stated no changes    Any recent hospitalizations or ED visits since last visit with CPP? Yes, back in June 2022  What diet changes have been made to improve Cholesterol?  Pt stated no changes   What exercise is being done to improve Cholesterol?  Pt stated she is getting a lot of exercising lately. She is moving a lot of furniture around and house work   Adherence Review: Does the patient have >5 day gap between last estimated fill dates? No  Star Rating Drugs: Pt gets through mail order and stated has enough supply Medication Name Last Fill Days supply Atorvastatin   09/23/20  90ds   Care Gaps: Last annual wellness visit? 07/26/15   Elray Mcgregor, [place] Pharmacist Assistant  [phone]

## 2021-03-12 NOTE — Telephone Encounter (Signed)
Coordinated with staff to arrange an AWV

## 2021-03-19 ENCOUNTER — Other Ambulatory Visit: Payer: Self-pay | Admitting: Legal Medicine

## 2021-04-13 DIAGNOSIS — R300 Dysuria: Secondary | ICD-10-CM | POA: Diagnosis not present

## 2021-04-22 DIAGNOSIS — I10 Essential (primary) hypertension: Secondary | ICD-10-CM | POA: Diagnosis not present

## 2021-04-22 DIAGNOSIS — R739 Hyperglycemia, unspecified: Secondary | ICD-10-CM | POA: Diagnosis not present

## 2021-04-22 DIAGNOSIS — R42 Dizziness and giddiness: Secondary | ICD-10-CM | POA: Diagnosis not present

## 2021-04-22 DIAGNOSIS — H538 Other visual disturbances: Secondary | ICD-10-CM | POA: Diagnosis not present

## 2021-04-22 DIAGNOSIS — Z20822 Contact with and (suspected) exposure to covid-19: Secondary | ICD-10-CM | POA: Diagnosis not present

## 2021-04-22 DIAGNOSIS — R531 Weakness: Secondary | ICD-10-CM | POA: Diagnosis not present

## 2021-04-23 ENCOUNTER — Encounter: Payer: Self-pay | Admitting: Legal Medicine

## 2021-04-23 ENCOUNTER — Other Ambulatory Visit: Payer: Self-pay

## 2021-04-23 ENCOUNTER — Ambulatory Visit (INDEPENDENT_AMBULATORY_CARE_PROVIDER_SITE_OTHER): Payer: Medicare HMO | Admitting: Legal Medicine

## 2021-04-23 ENCOUNTER — Ambulatory Visit: Payer: Medicare HMO

## 2021-04-23 VITALS — BP 140/88 | HR 83 | Temp 97.5°F | Resp 16 | Ht 66.0 in | Wt 253.0 lb

## 2021-04-23 DIAGNOSIS — E1169 Type 2 diabetes mellitus with other specified complication: Secondary | ICD-10-CM

## 2021-04-23 DIAGNOSIS — I152 Hypertension secondary to endocrine disorders: Secondary | ICD-10-CM | POA: Diagnosis not present

## 2021-04-23 DIAGNOSIS — E782 Mixed hyperlipidemia: Secondary | ICD-10-CM

## 2021-04-23 DIAGNOSIS — L89102 Pressure ulcer of unspecified part of back, stage 2: Secondary | ICD-10-CM | POA: Diagnosis not present

## 2021-04-23 DIAGNOSIS — N3000 Acute cystitis without hematuria: Secondary | ICD-10-CM | POA: Diagnosis not present

## 2021-04-23 DIAGNOSIS — E1159 Type 2 diabetes mellitus with other circulatory complications: Secondary | ICD-10-CM | POA: Diagnosis not present

## 2021-04-23 DIAGNOSIS — E669 Obesity, unspecified: Secondary | ICD-10-CM

## 2021-04-23 DIAGNOSIS — I10 Essential (primary) hypertension: Secondary | ICD-10-CM

## 2021-04-23 DIAGNOSIS — K2100 Gastro-esophageal reflux disease with esophagitis, without bleeding: Secondary | ICD-10-CM

## 2021-04-23 DIAGNOSIS — G839 Paralytic syndrome, unspecified: Secondary | ICD-10-CM | POA: Insufficient documentation

## 2021-04-23 LAB — POCT URINALYSIS DIP (CLINITEK)
Bilirubin, UA: NEGATIVE
Blood, UA: NEGATIVE
Glucose, UA: >=1000 mg/dL — AB
Ketones, POC UA: NEGATIVE mg/dL
Nitrite, UA: NEGATIVE
POC PROTEIN,UA: NEGATIVE
Spec Grav, UA: 1.01 (ref 1.010–1.025)
Urobilinogen, UA: 0.2 E.U./dL
pH, UA: 6 (ref 5.0–8.0)

## 2021-04-23 MED ORDER — CLOTRIMAZOLE-BETAMETHASONE 1-0.05 % EX CREA: TOPICAL_CREAM | Freq: Two times a day (BID) | CUTANEOUS | 3 refills | Status: DC

## 2021-04-23 MED ORDER — NOVOFINE PEN NEEDLE 32G X 6 MM MISC: 1.0000 | 3 refills | Status: DC

## 2021-04-23 NOTE — Patient Instructions (Signed)
Visit Information   Goals Addressed   None    Patient Care Plan: Pharmacy Care Plan     Problem Identified: htn, hld, dm, gerd   Priority: High  Onset Date: 08/22/2020     Long-Range Goal: Disease Management   Start Date: 08/22/2020  Expected End Date: 08/22/2021  Recent Progress: On track  Priority: High  Note:    Current Barriers:  Unable to achieve control of blood sugar   Pharmacist Clinical Goal(s):  Patient will achieve control of diabetes as evidenced by a1c through collaboration with PharmD and provider.   Interventions: 1:1 collaboration with Lillard Anes, MD regarding development and update of comprehensive plan of care as evidenced by provider attestation and co-signature Inter-disciplinary care team collaboration (see longitudinal plan of care) Comprehensive medication review performed; medication list updated in electronic medical record  Hypertension (BP goal <130/80) BP Readings from Last 3 Encounters:  04/23/21 140/88  01/15/21 118/80  01/01/21 (!) 150/90  -Not Ideally Controlled -Current treatment: losartan-hydrochlorothizide 100-25 mg daily  -Medications previously tried: lisinopril, amlodipine, irbesartan-hctz -Current home readings: goes up with hot flash per patient. Checks it at home and is "good". ~120s/86 -Current dietary habits: states little appetite -Current exercise habits: walks and shops but no formal exercise -Denies hypotensive/hypertensive symptoms -Educated on BP goals and benefits of medications for prevention of heart attack, stroke and kidney damage; Daily salt intake goal < 2300 mg; Exercise goal of 150 minutes per week; Importance of home blood pressure monitoring; -Counseled to monitor BP at home weekly, document, and provide log at future appointments -Counseled on diet and exercise extensively December 2022: BP typically controlled but patient was still upset from possible MS Dx and ER trip yesterday. Will re-assess in 3  weeks at PCP f/u  Hyperlipidemia: (LDL goal < 70) The 10-year ASCVD risk score (Arnett DK, et al., 2019) is: 13.2%   Values used to calculate the score:     Age: 65 years     Sex: Female     Is Non-Hispanic African American: No     Diabetic: Yes     Tobacco smoker: No     Systolic Blood Pressure: 818 mmHg     Is BP treated: Yes     HDL Cholesterol: 52 mg/dL     Total Cholesterol: 153 mg/dL Lab Results  Component Value Date   CHOL 153 01/15/2021   CHOL 156 10/02/2020   CHOL 134 07/03/2020   Lab Results  Component Value Date   HDL 52 01/15/2021   HDL 61 10/02/2020   HDL 56 07/03/2020   Lab Results  Component Value Date   LDLCALC 76 01/15/2021   LDLCALC 75 10/02/2020   LDLCALC 57 07/03/2020   Lab Results  Component Value Date   TRIG 143 01/15/2021   TRIG 110 10/02/2020   TRIG 118 07/03/2020   Lab Results  Component Value Date   CHOLHDL 2.9 01/15/2021   CHOLHDL 2.6 10/02/2020   CHOLHDL 2.4 07/03/2020  No results found for: LDLDIRECT -Controlled -Current treatment: atorvastatin 40 mg daily  -Medications previously tried: none reported  -Current dietary patterns: reports little appetite -Current exercise habits: no formal exercise but encouraged patient to begin walking her loop with her neighbor -Educated on Cholesterol goals;  Benefits of statin for ASCVD risk reduction; Importance of limiting foods high in cholesterol; Exercise goal of 150 minutes per week; -Counseled on diet and exercise extensively Recommended to continue current medication  Diabetes (A1c goal <7%) Lab Results  Component  Value Date   HGBA1C 8.9 (H) 01/15/2021   HGBA1C 7.9 (H) 10/02/2020   HGBA1C 8.2 (H) 07/03/2020   Lab Results  Component Value Date   MICROALBUR 10 07/03/2020   LDLCALC 76 01/15/2021   CREATININE 0.81 01/15/2021   Lab Results  Component Value Date   NA 136 01/15/2021   K 4.7 01/15/2021   CREATININE 0.81 01/15/2021   EGFR 81 01/15/2021   GFRNONAA 82 07/03/2020    GLUCOSE 257 (H) 01/15/2021   Lab Results  Component Value Date   WBC 10.3 01/15/2021   HGB 11.2 01/15/2021   HCT 35.2 01/15/2021   MCV 88 01/15/2021   PLT 438 01/15/2021  -Uncontrolled -Current medications: accu-chek softclix accu-chek aviva  Glipizide 5 mg twice daily  Glucose blood test strips three times daily  Pen needles daily tresiba flextouch 28 units daily Ozempic (Not able to get from Pharmacy) -Medications previously tried: metformin,  -Current home glucose readings fasting glucose:  April 2022: 168, 202 post prandial glucose: not reported -Denies hypoglycemic/hyperglycemic symptoms -Current meal patterns: Doesn't eat bread or sandwiches. Less appetite lately.  breakfast: eggs, eats 1 banana a day lunch:  sandwich and banana dinner:  hamburger, cream potatoes and gravy snacks:  limited drinks:  water, tea,  -Current exercise:  walks, shops, enjoys dancing but hasn't been since COVID -Educated on A1c and blood sugar goals; Complications of diabetes including kidney damage, retinal damage, and cardiovascular disease; Exercise goal of 150 minutes per week; Benefits of routine self-monitoring of blood sugar; Carbohydrate counting and/or plate method -Counseled to check feet daily and get yearly eye exams -Counseled on diet and exercise extensively December 2022: Patient is very upset about recent trip to ER and possible MS Dx. Because of this, didn't talk too much about sugars. She did state she has not picked up Ozempic because Pharmacy is unable to get/fill it. After speaking with patient, she expressed frustrations about having to take all the meds, organize them, keep track of refills, and have to make phone calls just to take a medicine she doesn't want. Explained [facility] process and she stated she'd like to start using in Jan once her new insurance starts. She wants to wait on the Ozempic until then Verbal consent obtained for [facility] enhanced pharmacy  services (medication synchronization, adherence packaging, delivery coordination). A medication sync plan was created to allow patient to get all medications delivered once every 30 to 90 days per patient preference. Patient understands they have freedom to choose pharmacy and clinical pharmacist will coordinate care between all prescribers and [facility].   Epilepsy (Goal: seizure control) -Controlled -Current treatment  Vimpat 200 mg - 1.5 tablets twice daily  Keppra 1000 mg tid -Medications previously tried: dilantin  -Recommended to continue current medication  GERD (Goal: manage symptoms and prevent recurrence of stomach bleed) -Controlled -Current treatment  Pantoprazole 40 mg bid -Medications previously tried:  omeprazole, Zantac -Counseled on diet and exercise extensively Recommended to continue current medication Recommended patient follow-up with GI if symptoms reoccur.   Asthma (Goal: manage symptoms and avoid falres) -Controlled -Current treatment  montelukast 10 mg daily as directed  -Medications previously tried: none reported  -Recommended to continue current medication   Patient Goals/Self-Care Activities Patient will:  - take medications as prescribed focus on medication adherence by continuing to use daily pill box check glucose twice daily, document, and provide at future appointments target a minimum of 150 minutes of moderate intensity exercise weekly engage in dietary modifications by  limiting carbohydrates/sugars and balanced meals  Follow Up Plan: Telephone follow up appointment with care management team member scheduled for:  Jan 2023  [place] Constable, [place].D. - [phone]       The patient verbalized understanding of instructions, educational materials, and care plan provided today and declined offer to receive copy of patient instructions, educational materials, and care plan.  The pharmacy team will reach out to the patient again over  the next 90 days.   Lane Hacker, [facility]

## 2021-04-23 NOTE — Progress Notes (Deleted)
Subjective:  Patient ID: Alicia Kelly, female    DOB: [birth year]  Age: 65 y.o. MRN: [id number]  Chief Complaint  Patient presents with   Wound Infection   Numbness   Dizziness   Diabetes   Follow-up    HPI   Current Outpatient Medications on File Prior to Visit  Medication Sig Dispense Refill   Accu-Chek Softclix Lancets lancets TEST BLOOD SUGAR THREE TIMES DAILY 300 each 1   atorvastatin (LIPITOR) 40 MG tablet Take 1 tablet (40 mg total) by mouth daily. 90 tablet 1   Blood Glucose Calibration (ACCU-CHEK AVIVA) SOLN      Blood Glucose Monitoring Suppl (ACCU-CHEK AVIVA PLUS) w/Device KIT 1 each by Does not apply route 3 (three) times daily. 1 kit 0   clotrimazole-betamethasone (LOTRISONE) cream APPLY 1 APPLICATION TOPICALLY 2 TIMES DAILY 30 g 3   cyclobenzaprine (FLEXERIL) 10 MG tablet Take 1 tablet (10 mg total) by mouth 3 (three) times daily as needed for muscle spasms. 30 tablet 0   glipiZIDE (GLUCOTROL) 5 MG tablet Take 1 tablet (5 mg total) by mouth 2 (two) times daily. 180 tablet 2   glucose blood (ACCU-CHEK AVIVA PLUS) test strip 1 each by Other route 3 (three) times daily. Use as instructed 300 strip 2   Insulin Pen Needle (PEN NEEDLES) 31G X 5 MM MISC 1 each by Does not apply route daily. 100 each 2   lacosamide (VIMPAT) 200 MG TABS tablet Take 1.5 tablets (300 mg total) by mouth 2 (two) times daily. 180 tablet 1   levETIRAcetam (KEPPRA) 1000 MG tablet Take 1,000 mg by mouth in the morning, at noon, and at bedtime.     losartan-hydrochlorothiazide (HYZAAR) 100-25 MG tablet Take 1 tablet by mouth daily. 90 tablet 1   montelukast (SINGULAIR) 10 MG tablet Take 1 tablet (10 mg total) by mouth daily. as directed 90 tablet 2   pantoprazole (PROTONIX) 40 MG tablet TAKE 1 TABLET TWICE DAILY 180 tablet 1   TRESIBA FLEXTOUCH 100 UNIT/ML FlexTouch Pen Inject 26 Units into the skin daily. 15 mL 5   Semaglutide,0.25 or 0.5MG/DOS, (OZEMPIC, 0.25 OR 0.5 MG/DOSE,) 2 MG/1.5ML SOPN Inject  0.5 mg into the skin once a week. (Patient not taking: Reported on 04/23/2021) 6 mL 3   No current facility-administered medications on file prior to visit.   Past Medical History:  Diagnosis Date   Asthma    Calculus of gallbladder with chronic cholecystitis without obstruction 03/22/2017   Diabetes ([place])    Epilepsy ([place])    Hypertension    Sleep apnea    Past Surgical History:  Procedure Laterality Date   APPENDECTOMY     TONSILLECTOMY     TONSILLECTOMY      Family History  Problem Relation Age of Onset   Colon cancer Mother        d. 62   Hypertension Father    Diabetes Father    Allergic rhinitis Sister    Hypertension Sister    Colon cancer Maternal Grandmother        dx 58s   Colon cancer Paternal Grandmother        dx 20s   Diabetes Paternal Grandmother    Cancer Maternal Uncle    Breast cancer Neg Hx    Social History   Socioeconomic History   Marital status: Married    Spouse name: Not on file   Number of children: Not on file   Years of education: Not on file  Highest education level: Not on file  Occupational History   Not on file  Tobacco Use   Smoking status: Never   Smokeless tobacco: Never  Vaping Use   Vaping Use: Never used  Substance and Sexual Activity   Alcohol use: Not Currently   Drug use: Never   Sexual activity: Not on file  Other Topics Concern   Not on file  Social History Narrative   Not on file   Social Determinants of Health   Financial Resource Strain: Low Risk    Difficulty of Paying Living Expenses: Not hard at all  Food Insecurity: No Food Insecurity   Worried About Estate manager/land agent of Food in the Last Year: Never true   [place] in the Last Year: Never true  Transportation Needs: No Transportation Needs   Lack of Transportation (Medical): No   Lack of Transportation (Non-Medical): No  Physical Activity: Insufficiently Active   Days of Exercise per Week: 2 days   Minutes of Exercise per Session: 20 min  Stress:  No Stress Concern Present   Feeling of Stress : Not at all  Social Connections: Moderately Isolated   Frequency of Communication with Friends and Family: More than three times a week   Frequency of Social Gatherings with Friends and Family: Never   Attends Religious Services: Never   Marine scientist or Organizations: No   Attends Music therapist: Never   Marital Status: Married    Review of Systems  Constitutional:  Negative for chills, fatigue and fever.  HENT:  Negative for congestion, ear pain and sore throat.   Respiratory:  Negative for cough and shortness of breath.   Cardiovascular:  Negative for chest pain and palpitations.  Gastrointestinal:  Negative for abdominal pain, constipation, diarrhea, nausea and vomiting.  Endocrine: Negative for polydipsia, polyphagia and polyuria.  Genitourinary:  Negative for difficulty urinating and dysuria.  Musculoskeletal:  Negative for arthralgias, back pain and myalgias.  Skin:  Positive for wound (buttocks). Negative for rash.  Neurological:  Negative for headaches.  Psychiatric/Behavioral:  Negative for dysphoric mood. The patient is not nervous/anxious.     Objective:  BP 140/88   Pulse 83   Temp (!) 97.5 F (36.4 C)   Resp 16   Ht '5\' 6"'  (1.676 m)   Wt 253 lb (114.8 kg)   LMP  (LMP Unknown)   SpO2 98%   BMI 40.84 kg/m   BP/Weight 04/23/2021 01/15/2021 3/84/5364  Systolic BP 680 321 224  Diastolic BP 88 80 90  Wt. (Lbs) 253 250 247  BMI 40.84 40.35 39.87    Physical Exam  Diabetic Foot Exam - Simple   No data filed      Lab Results  Component Value Date   WBC 10.3 01/15/2021   HGB 11.2 01/15/2021   HCT 35.2 01/15/2021   PLT 438 01/15/2021   GLUCOSE 257 (H) 01/15/2021   CHOL 153 01/15/2021   TRIG 143 01/15/2021   HDL 52 01/15/2021   LDLCALC 76 01/15/2021   ALT 21 01/15/2021   AST 18 01/15/2021   NA 136 01/15/2021   K 4.7 01/15/2021   CL 98 01/15/2021   CREATININE 0.81 01/15/2021   BUN  17 01/15/2021   CO2 24 01/15/2021   HGBA1C 8.9 (H) 01/15/2021   MICROALBUR 10 07/03/2020      Assessment & Plan:   Problem List Items Addressed This Visit   None Visit Diagnoses     Decubitus ulcer  of back, stage 2 ([place])         .  No orders of the defined types were placed in this encounter.   No orders of the defined types were placed in this encounter.    Follow-up: No follow-ups on file.  An After Visit Summary was printed and given to the patient.  Reinaldo Meeker, MD [facility] [phone]

## 2021-04-23 NOTE — Progress Notes (Signed)
Chronic Care Management Pharmacy Note  04/23/2021 Name:  Alicia Kelly MRN:  [id number] DOB:  [birth year]   Plan Recommendations:  Patient non-compliant on Ozempic. Her Pharmacy is unable to get it. After discussion, starting in Jan, when she gets a new insurance, would like to start utilizing [employer] so I can manage meds and take care of any issues such as the Ozempic. For now, she stated she'd like to hold off on the Ozempic until Jan  Subjective: Alicia Kelly is an 65 y.o. year old female who is a primary patient of Henrene Pastor, Zeb Comfort, MD.  The CCM team was consulted for assistance with disease management and care coordination needs.    Engaged with patient by telephone for initial visit in response to provider referral for pharmacy case management and/or care coordination services.   Consent to Services:  The patient was given the following information about Chronic Care Management services today, agreed to services, and gave verbal consent: 1. CCM service includes personalized support from designated clinical staff supervised by the primary care provider, including individualized plan of care and coordination with other care providers 2. 24/7 contact phone numbers for assistance for urgent and routine care needs. 3. Service will only be billed when office clinical staff spend 20 minutes or more in a month to coordinate care. 4. Only one practitioner may furnish and bill the service in a calendar month. 5.The patient may stop CCM services at any time (effective at the end of the month) by phone call to the office staff. 6. The patient will be responsible for cost sharing (co-pay) of up to 20% of the service fee (after annual deductible is met). Patient agreed to services and consent obtained.  Patient Care Team: Lillard Anes, MD as PCP - General (Family Medicine) Burnice Logan, [facility] (Inactive) as Pharmacist (Pharmacist) Lane Hacker, [facility] as Pharmacist  (Pharmacist)  Recent office visits:  01/22/21 Orders Only. Started Ozempic 43m/1.5ml inject 0.5 into skin once weekly.    01/15/21 PReinaldo MeekerMD. Seen of obesity/diabetes. No med changes.   01/01/21 PReinaldo MeekerMD. Seen for cough. No med changes.    12/16/20 Orders Only. D/C Mycolog 1 application 2 times daily due to change in therapy.   11/04/20 PReinaldo MeekerMD. Seen for hospitalization follow up. D/C Omeprazole 40 mg daily due to change in therapy.   Recent consult visits:  10/28/20 (Neurology) FMaurie BoettcherMD. Seen for Generalized Epilepsy. Ordered Vimpat 200 mg take 1.5  2 times daily. Ordered Keppra 1000 mg 1 tablet three times daily.   10/25/20 (Oncology) LLavera GuiseMD. Seen for Leukocytosis. No med changes.   Hospital visits:  615/22-10/1720 ED visit. No notes available   Objective:  Lab Results  Component Value Date   CREATININE 0.81 01/15/2021   BUN 17 01/15/2021   GFRNONAA 82 07/03/2020   GFRAA 95 07/03/2020   NA 136 01/15/2021   K 4.7 01/15/2021   CALCIUM 9.6 01/15/2021   CO2 24 01/15/2021   GLUCOSE 257 (H) 01/15/2021    Lab Results  Component Value Date/Time   HGBA1C 8.9 (H) 01/15/2021 10:37 AM   HGBA1C 7.9 (H) 10/02/2020 08:57 AM   MICROALBUR 10 07/03/2020 10:25 AM    Last diabetic Eye exam: No results found for: HMDIABEYEEXA  Last diabetic Foot exam: No results found for: HMDIABFOOTEX   Lab Results  Component Value Date   CHOL 153 01/15/2021   HDL 52 01/15/2021   LDLCALC 76 01/15/2021  TRIG 143 01/15/2021   CHOLHDL 2.9 01/15/2021    Hepatic Function Latest Ref Rng & Units 01/15/2021 11/04/2020 10/02/2020  Total Protein 6.0 - 8.5 g/dL 6.5 6.2 7.1  Albumin 3.8 - 4.8 g/dL 4.2 3.7(L) 4.5  AST 0 - 40 IU/L _0 ALT 0 - 32 IU/L _1 Alk Phosphatase 44 - 121 IU/L 83 58 85  Total Bilirubin 0.0 - 1.2 mg/dL 0.3 0.2 0.3    No results found for: TSH, FREET4  CBC Latest Ref Rng & Units 01/15/2021 10/25/2020 10/10/2020  WBC 3.4 - 10.8  x10E3/uL 10.3 9.5 13.9(H)  Hemoglobin 11.1 - 15.9 g/dL 11.2 11.7(A) 11.3  Hematocrit 34.0 - 46.6 % 35.2 35(A) 34.5  Platelets 150 - 450 x10E3/uL 438 389 483(H)    No results found for: VD25OH  Clinical ASCVD: No  The 10-year ASCVD risk score (Arnett DK, et al., 2019) is: 13.2%   Values used to calculate the score:     Age: 65 years     Sex: Female     Is Non-Hispanic African American: No     Diabetic: Yes     Tobacco smoker: No     Systolic Blood Pressure: 161 mmHg     Is BP treated: Yes     HDL Cholesterol: 52 mg/dL     Total Cholesterol: 153 mg/dL    Depression screen PHQ 2/9 07/03/2020  Decreased Interest 0  Down, Depressed, Hopeless 0  PHQ - 2 Score 0      Social History   Tobacco Use  Smoking Status Never  Smokeless Tobacco Never   BP Readings from Last 3 Encounters:  04/23/21 140/88  01/15/21 118/80  01/01/21 (!) 150/90   Pulse Readings from Last 3 Encounters:  04/23/21 83  01/15/21 66  01/01/21 75   Wt Readings from Last 3 Encounters:  04/23/21 253 lb (114.8 kg)  01/15/21 250 lb (113.4 kg)  01/01/21 247 lb (112 kg)   BMI Readings from Last 3 Encounters:  04/23/21 40.84 kg/m  01/15/21 40.35 kg/m  01/01/21 39.87 kg/m    Assessment/Interventions: Review of patient past medical history, allergies, medications, health status, including review of consultants reports, laboratory and other test data, was performed as part of comprehensive evaluation and provision of chronic care management services.   SDOH:  (Social Determinants of Health) assessments and interventions performed: Yes SDOH Interventions    Flowsheet Row Most Recent Value  SDOH Interventions   Financial Strain Interventions Intervention Not Indicated      SDOH Screenings   Alcohol Screen: Low Risk    Last Alcohol Screening Score (AUDIT): 0  Depression (PHQ2-9): Low Risk    PHQ-2 Score: 0  Financial Resource Strain: Low Risk    Difficulty of Paying Living Expenses: Not hard at  all  Food Insecurity: No Food Insecurity   Worried About Charity fundraiser in the Last Year: Never true   Ran Out of Food in the Last Year: Never true  Housing: Low Risk    Last Housing Risk Score: 0  Physical Activity: Insufficiently Active   Days of Exercise per Week: 2 days   Minutes of Exercise per Session: 20 min  Social Connections: Moderately Isolated   Frequency of Communication with Friends and Family: More than three times a week   Frequency of Social Gatherings with Friends and Family: Never   Attends Religious Services: Never   Marine scientist or Organizations: No   Attends Club or  Organization Meetings: Never   Marital Status: Married  Stress: No Stress Concern Present   Feeling of Stress : Not at all  Tobacco Use: Low Risk    Smoking Tobacco Use: Never   Smokeless Tobacco Use: Never   Passive Exposure: Not on file  Transportation Needs: No Transportation Needs   Lack of Transportation (Medical): No   Lack of Transportation (Non-Medical): No    CCM Care Plan  Allergies  Allergen Reactions   Levofloxacin Other (See Comments)    Throat swelling   Lisinopril Hives   Nitrofurantoin Swelling    Throat swelling   Penicillins Anaphylaxis   Clarithromycin Other (See Comments)    seizures   Sulfamethoxazole-Trimethoprim     Causes seizures    Medications Reviewed Today     Reviewed by Lane Hacker, [facility] (Pharmacist) on 04/23/21 at 1613  Med List Status: <None>   Medication Order Taking? Sig Documenting Provider Last Dose Status Informant  Accu-Chek Softclix Lancets lancets [id number]  TEST BLOOD SUGAR THREE TIMES DAILY Lillard Anes, MD  Active   atorvastatin (LIPITOR) 40 MG tablet [id number]  Take 1 tablet (40 mg total) by mouth daily. Lillard Anes, MD  Active   Blood Glucose Calibration ([place]) [place] [id number]   [provider]  Active   Blood Glucose Monitoring Suppl (ACCU-CHEK AVIVA PLUS) w/Device KIT [id number]   1 each by Does not apply route 3 (three) times daily. Lillard Anes, MD  Active   clotrimazole-betamethasone Donalynn Furlong) cream [id number]  Apply topically 2 (two) times daily. Lillard Anes, MD  Active   cyclobenzaprine (FLEXERIL) 10 MG tablet [id number]  Take 1 tablet (10 mg total) by mouth 3 (three) times daily as needed for muscle spasms. Lillard Anes, MD  Active   glipiZIDE (GLUCOTROL) 5 MG tablet [id number]  Take 1 tablet (5 mg total) by mouth 2 (two) times daily. Lillard Anes, MD  Active   glucose blood (ACCU-CHEK AVIVA PLUS) test strip [id number]  1 each by Other route 3 (three) times daily. Use as instructed Lillard Anes, MD  Active   Insulin Pen Needle (NOVOFINE PEN NEEDLE) 32G X 6 MM MISC [id number]  1 each by Does not apply route every 7 (seven) days. Lillard Anes, MD  Active   lacosamide (VIMPAT) 200 MG TABS tablet [id number]  Take 1.5 tablets (300 mg total) by mouth 2 (two) times daily. Cox, Kirsten, MD  Active   levETIRAcetam (KEPPRA) 1000 MG tablet [id number]  Take 1,000 mg by mouth in the morning, at noon, and at bedtime. [provider]  Active   losartan-hydrochlorothiazide (HYZAAR) 100-25 MG tablet [id number]  Take 1 tablet by mouth daily. Lillard Anes, MD  Active   montelukast (SINGULAIR) 10 MG tablet [id number]  Take 1 tablet (10 mg total) by mouth daily. as directed Lillard Anes, MD  Active   pantoprazole ([place]) 40 MG tablet [id number]  TAKE 1 TABLET TWICE DAILY Lillard Anes, MD  Active   Semaglutide,0.25 or 0.5MG/DOS, (OZEMPIC, 0.25 OR 0.5 MG/DOSE,) 2 MG/1.5ML SOPN [id number]  Inject 0.5 mg into the skin once a week.  Patient not taking: Reported on 04/23/2021   Lillard Anes, MD  Active   TRESIBA FLEXTOUCH 100 UNIT/ML FlexTouch Pen [id number]  Inject 26 Units into the skin daily. Lillard Anes, MD  Active             Patient Active Problem List   Diagnosis Date  Noted  Paralysis ([place]) 04/23/2021   BMI 40.0-44.9, adult ([place]) 01/15/2021   Morbid obesity ([place]) 01/15/2021   Leukocytosis 11/10/2020   Left-sided weakness 09/11/2020   Recurrent falls 09/11/2020   Back pain 09/05/2020   Obesity, diabetes, and hypertension syndrome ([place]) 07/03/2020   Complex partial seizures ([place]) 04/09/2020   Chest pain 08/01/2018   Essential hypertension 08/01/2018   Hyperlipidemia 08/01/2018   Coronary artery calcification seen on CT scan 08/01/2018   Postmenopause 04/30/2017   Lumbosacral radiculopathy at L4 01/07/2017   GERD (gastroesophageal reflux disease) 11/29/2016   Sleep apnea 11/29/2016   Sinus bradycardia 02/13/2016   Generalized epilepsy ([place]) 01/05/2013   Partial epilepsy with impairment of consciousness, with intractable epilepsy ([place]) 01/05/2013    Immunization History  Administered Date(s) Administered   Moderna Sars-Covid-2 Vaccination 09/15/2019, 10/13/2019, 05/15/2020, 11/08/2020   Pneumococcal Polysaccharide-23 10/02/2020   Tdap 04/04/2014    Conditions to be addressed/monitored:  Hypertension, Hyperlipidemia, Diabetes, Coronary Artery Disease, GERD and Epilepsy.   Care Plan : Pharmacy Care Plan  Updates made by Lane Hacker, RPH since 04/23/2021 12:00 AM     Problem: htn, hld, dm, gerd   Priority: High  Onset Date: 08/22/2020     Long-Range Goal: Disease Management   Start Date: 08/22/2020  Expected End Date: 08/22/2021  Recent Progress: On track  Priority: High  Note:    Current Barriers:  Unable to achieve control of blood sugar   Pharmacist Clinical Goal(s):  Patient will achieve control of diabetes as evidenced by a1c through collaboration with PharmD and provider.   Interventions: 1:1 collaboration with Lillard Anes, MD regarding development and update of comprehensive plan of care as evidenced by provider attestation and co-signature Inter-disciplinary care team collaboration (see longitudinal plan of  care) Comprehensive medication review performed; medication list updated in electronic medical record  Hypertension (BP goal <130/80) BP Readings from Last 3 Encounters:  04/23/21 140/88  01/15/21 118/80  01/01/21 (!) 150/90  -Not Ideally Controlled -Current treatment: losartan-hydrochlorothizide 100-25 mg daily  -Medications previously tried: lisinopril, amlodipine, irbesartan-hctz -Current home readings: goes up with hot flash per patient. Checks it at home and is "good". ~120s/86 -Current dietary habits: states little appetite -Current exercise habits: walks and shops but no formal exercise -Denies hypotensive/hypertensive symptoms -Educated on BP goals and benefits of medications for prevention of heart attack, stroke and kidney damage; Daily salt intake goal < 2300 mg; Exercise goal of 150 minutes per week; Importance of home blood pressure monitoring; -Counseled to monitor BP at home weekly, document, and provide log at future appointments -Counseled on diet and exercise extensively December 2022: BP typically controlled but patient was still upset from possible MS Dx and ER trip yesterday. Will re-assess in 3 weeks at PCP f/u  Hyperlipidemia: (LDL goal < 70) The 10-year ASCVD risk score (Arnett DK, et al., 2019) is: 13.2%   Values used to calculate the score:     Age: 16 years     Sex: Female     Is Non-Hispanic African American: No     Diabetic: Yes     Tobacco smoker: No     Systolic Blood Pressure: 570 mmHg     Is BP treated: Yes     HDL Cholesterol: 52 mg/dL     Total Cholesterol: 153 mg/dL Lab Results  Component Value Date   CHOL 153 01/15/2021   CHOL 156 10/02/2020   CHOL 134 07/03/2020   Lab Results  Component Value Date   HDL 52 01/15/2021  HDL 61 10/02/2020   HDL 56 07/03/2020   Lab Results  Component Value Date   LDLCALC 76 01/15/2021   LDLCALC 75 10/02/2020   LDLCALC 57 07/03/2020   Lab Results  Component Value Date   TRIG 143 01/15/2021    TRIG 110 10/02/2020   TRIG 118 07/03/2020   Lab Results  Component Value Date   CHOLHDL 2.9 01/15/2021   CHOLHDL 2.6 10/02/2020   CHOLHDL 2.4 07/03/2020  No results found for: LDLDIRECT -Controlled -Current treatment: atorvastatin 40 mg daily  -Medications previously tried: none reported  -Current dietary patterns: reports little appetite -Current exercise habits: no formal exercise but encouraged patient to begin walking her loop with her neighbor -Educated on Cholesterol goals;  Benefits of statin for ASCVD risk reduction; Importance of limiting foods high in cholesterol; Exercise goal of 150 minutes per week; -Counseled on diet and exercise extensively Recommended to continue current medication  Diabetes (A1c goal <7%) Lab Results  Component Value Date   HGBA1C 8.9 (H) 01/15/2021   HGBA1C 7.9 (H) 10/02/2020   HGBA1C 8.2 (H) 07/03/2020   Lab Results  Component Value Date   MICROALBUR 10 07/03/2020   LDLCALC 76 01/15/2021   CREATININE 0.81 01/15/2021   Lab Results  Component Value Date   NA 136 01/15/2021   K 4.7 01/15/2021   CREATININE 0.81 01/15/2021   EGFR 81 01/15/2021   GFRNONAA 82 07/03/2020   GLUCOSE 257 (H) 01/15/2021   Lab Results  Component Value Date   WBC 10.3 01/15/2021   HGB 11.2 01/15/2021   HCT 35.2 01/15/2021   MCV 88 01/15/2021   PLT 438 01/15/2021  -Uncontrolled -Current medications: accu-chek softclix accu-chek aviva  Glipizide 5 mg twice daily  Glucose blood test strips three times daily  Pen needles daily tresiba flextouch 28 units daily Ozempic (Not able to get from Pharmacy) -Medications previously tried: metformin,  -Current home glucose readings fasting glucose:  April 2022: 168, 202 post prandial glucose: not reported -Denies hypoglycemic/hyperglycemic symptoms -Current meal patterns: Doesn't eat bread or sandwiches. Less appetite lately.  breakfast: eggs, eats 1 banana a day lunch:  sandwich and banana dinner:   hamburger, cream potatoes and gravy snacks:  limited drinks:  water, tea,  -Current exercise:  walks, shops, enjoys dancing but hasn't been since COVID -Educated on A1c and blood sugar goals; Complications of diabetes including kidney damage, retinal damage, and cardiovascular disease; Exercise goal of 150 minutes per week; Benefits of routine self-monitoring of blood sugar; Carbohydrate counting and/or plate method -Counseled to check feet daily and get yearly eye exams -Counseled on diet and exercise extensively December 2022: Patient is very upset about recent trip to ER and possible MS Dx. Because of this, didn't talk too much about sugars. She did state she has not picked up Ozempic because Pharmacy is unable to get/fill it. After speaking with patient, she expressed frustrations about having to take all the meds, organize them, keep track of refills, and have to make phone calls just to take a medicine she doesn't want. Explained [facility] process and she stated she'd like to start using in Jan once her new insurance starts. She wants to wait on the Ozempic until then Verbal consent obtained for [facility] enhanced pharmacy services (medication synchronization, adherence packaging, delivery coordination). A medication sync plan was created to allow patient to get all medications delivered once every 30 to 90 days per patient preference. Patient understands they have freedom to choose pharmacy and clinical pharmacist will  coordinate care between all prescribers and [facility].   Epilepsy (Goal: seizure control) -Controlled -Current treatment  Vimpat 200 mg - 1.5 tablets twice daily  Keppra 1000 mg tid -Medications previously tried: dilantin  -Recommended to continue current medication  GERD (Goal: manage symptoms and prevent recurrence of stomach bleed) -Controlled -Current treatment  Pantoprazole 40 mg bid -Medications previously tried:  omeprazole, Zantac -Counseled on  diet and exercise extensively Recommended to continue current medication Recommended patient follow-up with GI if symptoms reoccur.   Asthma (Goal: manage symptoms and avoid falres) -Controlled -Current treatment  montelukast 10 mg daily as directed  -Medications previously tried: none reported  -Recommended to continue current medication   Patient Goals/Self-Care Activities Patient will:  - take medications as prescribed focus on medication adherence by continuing to use daily pill box check glucose twice daily, document, and provide at future appointments target a minimum of 150 minutes of moderate intensity exercise weekly engage in dietary modifications by limiting carbohydrates/sugars and balanced meals  Follow Up Plan: Telephone follow up appointment with care management team member scheduled for:  Jan 2023  Arizona Constable, Pharm.D. - [phone]       Medication Assistance: None required.  Patient affirms current coverage meets needs.  Adherence Review: Does the patient have >5 day gap between last estimated fill dates? No   Star Rating Drugs: Pt gets through mail order and stated has enough supply Medication Name      Last Fill          Days supply Atorvastatin                 09/23/20              90ds     Care Gaps: Last annual wellness visit? 07/26/15  Patient's preferred pharmacy is:  [facility], [place] [address] Phone: [phone] Fax: [phone]  [place], [place] [address] Phone: [phone] Fax: [phone]  Uses pill box? No - uses small daily pill container but not weekly planner.  Pt endorses good compliance  We discussed: Benefits of medication synchronization, packaging and delivery as well as enhanced pharmacist oversight with [facility]. Patient decided to: Utilize [facility] for  medication synchronization, packaging and delivery starting Jan 2023  Care Plan and Follow Up Patient Decision:  Patient agrees to Care Plan and Follow-up.  Plan: Telephone follow up appointment with care management team member scheduled for:  Jan 2023  Arizona Constable, [place].D. - [phone]

## 2021-04-23 NOTE — Progress Notes (Signed)
Established Patient Office Visit  Subjective:  Patient ID: Alicia Kelly, female    DOB: 1955/12/25  Age: 65 y.o. MRN: [id number]  CC:  Chief Complaint  Patient presents with   Wound Infection   Numbness   Dizziness   Diabetes   Follow-up    HPI She woke up paralyzed both legs and called EMT, she then regained movement over a short time to ER.  She awas able to move legs in ER and walk.  [place] family history for MS in sister and vertigo in family.  Alicia Kelly presents for sees dermatology for buttock rash  She has edema both legs today..  She has strength no history of spinal stenosis.  Diabetes doing poorly.  Infections in buttocks, sees dermatology. No history on paralysis before.  The legs are working well and she is walking.   Past Medical History:  Diagnosis Date   Asthma    Calculus of gallbladder with chronic cholecystitis without obstruction 03/22/2017   Diabetes ([place])    Epilepsy ([place])    Hypertension    Sleep apnea     Past Surgical History:  Procedure Laterality Date   APPENDECTOMY     TONSILLECTOMY     TONSILLECTOMY      Family History  Problem Relation Age of Onset   Colon cancer Mother        d. 93   Hypertension Father    Diabetes Father    Allergic rhinitis Sister    Hypertension Sister    Colon cancer Maternal Grandmother        dx 55s   Colon cancer Paternal Grandmother        dx 30s   Diabetes Paternal Grandmother    Cancer Maternal Uncle    Breast cancer Neg Hx     Social History   Socioeconomic History   Marital status: Married    Spouse name: Not on file   Number of children: Not on file   Years of education: Not on file   Highest education level: Not on file  Occupational History   Not on file  Tobacco Use   Smoking status: Never   Smokeless tobacco: Never  Vaping Use   Vaping Use: Never used  Substance and Sexual Activity   Alcohol use: Not Currently   Drug use: Never   Sexual activity: Not on file  Other  Topics Concern   Not on file  Social History Narrative   Not on file   Social Determinants of Health   Financial Resource Strain: Low Risk    Difficulty of Paying Living Expenses: Not hard at all  Food Insecurity: No Food Insecurity   Worried About Charity fundraiser in the Last Year: Never true   [place] in the Last Year: Never true  Transportation Needs: No Transportation Needs   Lack of Transportation (Medical): No   Lack of Transportation (Non-Medical): No  Physical Activity: Insufficiently Active   Days of Exercise per Week: 2 days   Minutes of Exercise per Session: 20 min  Stress: No Stress Concern Present   Feeling of Stress : Not at all  Social Connections: Moderately Isolated   Frequency of Communication with Friends and Family: More than three times a week   Frequency of Social Gatherings with Friends and Family: Never   Attends Religious Services: Never   Marine scientist or Organizations: No   Attends Archivist Meetings: Never   Marital Status: Married  Intimate Partner Violence: Not At Risk   Fear of Current or Ex-Partner: No   Emotionally Abused: No   Physically Abused: No   Sexually Abused: No    Outpatient Medications Prior to Visit  Medication Sig Dispense Refill   Accu-Chek Softclix Lancets lancets TEST BLOOD SUGAR THREE TIMES DAILY 300 each 1   atorvastatin (LIPITOR) 40 MG tablet Take 1 tablet (40 mg total) by mouth daily. 90 tablet 1   Blood Glucose Calibration (ACCU-CHEK AVIVA) SOLN      Blood Glucose Monitoring Suppl (ACCU-CHEK AVIVA PLUS) w/Device KIT 1 each by Does not apply route 3 (three) times daily. 1 kit 0   cyclobenzaprine (FLEXERIL) 10 MG tablet Take 1 tablet (10 mg total) by mouth 3 (three) times daily as needed for muscle spasms. 30 tablet 0   glipiZIDE (GLUCOTROL) 5 MG tablet Take 1 tablet (5 mg total) by mouth 2 (two) times daily. 180 tablet 2   glucose blood (ACCU-CHEK AVIVA PLUS) test strip 1 each by Other route  3 (three) times daily. Use as instructed 300 strip 2   lacosamide (VIMPAT) 200 MG TABS tablet Take 1.5 tablets (300 mg total) by mouth 2 (two) times daily. 180 tablet 1   levETIRAcetam (KEPPRA) 1000 MG tablet Take 1,000 mg by mouth in the morning, at noon, and at bedtime.     losartan-hydrochlorothiazide (HYZAAR) 100-25 MG tablet Take 1 tablet by mouth daily. 90 tablet 1   montelukast (SINGULAIR) 10 MG tablet Take 1 tablet (10 mg total) by mouth daily. as directed 90 tablet 2   pantoprazole (PROTONIX) 40 MG tablet TAKE 1 TABLET TWICE DAILY 180 tablet 1   TRESIBA FLEXTOUCH 100 UNIT/ML FlexTouch Pen Inject 26 Units into the skin daily. 15 mL 5   clotrimazole-betamethasone (LOTRISONE) cream APPLY 1 APPLICATION TOPICALLY 2 TIMES DAILY 30 g 3   Insulin Pen Needle (PEN NEEDLES) 31G X 5 MM MISC 1 each by Does not apply route daily. 100 each 2   Semaglutide,0.25 or 0.5MG/DOS, (OZEMPIC, 0.25 OR 0.5 MG/DOSE,) 2 MG/1.5ML SOPN Inject 0.5 mg into the skin once a week. (Patient not taking: Reported on 04/23/2021) 6 mL 3   betamethasone dipropionate 0.05 % cream Apply topically 2 (two) times daily. 30 g 2   doxycycline (VIBRA-TABS) 100 MG tablet Take 1 tablet (100 mg total) by mouth 2 (two) times daily. 20 tablet 0   fluconazole (DIFLUCAN) 100 MG tablet Take 1 tablet (100 mg total) by mouth daily. 7 tablet 0   MODERNA COVID-19 VACCINE 100 MCG/0.5ML injection      nystatin cream (MYCOSTATIN) APPLY TOPICALLY TWO TIMES DAILY. 30 g 2   No facility-administered medications prior to visit.    Allergies  Allergen Reactions   Levofloxacin Other (See Comments)    Throat swelling   Lisinopril Hives   Nitrofurantoin Swelling    Throat swelling   Penicillins Anaphylaxis   Clarithromycin Other (See Comments)    seizures   Sulfamethoxazole-Trimethoprim     Causes seizures    ROS Review of Systems  Constitutional:  Negative for activity change and appetite change.  HENT:  Negative for congestion.   Eyes:   Negative for visual disturbance.  Respiratory:  Negative for chest tightness and shortness of breath.   Cardiovascular:  Negative for chest pain, palpitations and leg swelling.  Gastrointestinal:  Negative for abdominal distention and abdominal pain.  Endocrine: Negative for polyuria.  Genitourinary:  Negative for difficulty urinating and dysuria.  Musculoskeletal:  Negative for arthralgias and back  pain.  Neurological: Negative.   Psychiatric/Behavioral: Negative.       Objective:    Physical Exam Vitals reviewed.  Constitutional:      General: She is not in acute distress.    Appearance: Normal appearance. She is obese.  HENT:     Right Ear: Tympanic membrane, ear canal and external ear normal.     Left Ear: Tympanic membrane, ear canal and external ear normal.     Mouth/Throat:     Mouth: Mucous membranes are dry.     Pharynx: Oropharynx is clear.  Eyes:     Conjunctiva/sclera: Conjunctivae normal.     Pupils: Pupils are equal, round, and reactive to light.  Cardiovascular:     Rate and Rhythm: Normal rate and regular rhythm.     Pulses: Normal pulses.     Heart sounds: Normal heart sounds. No murmur heard.   No gallop.  Pulmonary:     Effort: Pulmonary effort is normal. No respiratory distress.     Breath sounds: Normal breath sounds. No wheezing.  Abdominal:     General: Abdomen is flat. Bowel sounds are normal. There is no distension.     Tenderness: There is no abdominal tenderness.  Musculoskeletal:        General: Normal range of motion.     Cervical back: Normal range of motion.     Right lower leg: No edema.     Left lower leg: No edema.  Skin:    General: Skin is warm.     Capillary Refill: Capillary refill takes less than 2 seconds.     Comments: Diffuse rash on buttocks, sees dermatology, no skin breakdown  Neurological:     General: No focal deficit present.     Mental Status: She is alert and oriented to person, place, and time. Mental status is at  baseline.     Gait: Gait normal.     Deep Tendon Reflexes: Reflexes normal.    BP 140/88   Pulse 83   Temp (!) 97.5 F (36.4 C)   Resp 16   Ht '5\' 6"'  (1.676 m)   Wt 253 lb (114.8 kg)   LMP  (LMP Unknown)   SpO2 98%   BMI 40.84 kg/m  Wt Readings from Last 3 Encounters:  04/23/21 253 lb (114.8 kg)  01/15/21 250 lb (113.4 kg)  01/01/21 247 lb (112 kg)     Health Maintenance Due  Topic Date Due   OPHTHALMOLOGY EXAM  Never done   HIV Screening  Never done   Hepatitis C Screening  Never done   PAP SMEAR-Modifier  Never done   Zoster Vaccines- Shingrix (1 of 2) Never done   INFLUENZA VACCINE  12/16/2020   COVID-19 Vaccine (5 - Booster for Moderna series) 01/03/2021    There are no preventive care reminders to display for this patient.  No results found for: TSH Lab Results  Component Value Date   WBC 10.3 01/15/2021   HGB 11.2 01/15/2021   HCT 35.2 01/15/2021   MCV 88 01/15/2021   PLT 438 01/15/2021   Lab Results  Component Value Date   NA 136 01/15/2021   K 4.7 01/15/2021   CO2 24 01/15/2021   GLUCOSE 257 (H) 01/15/2021   BUN 17 01/15/2021   CREATININE 0.81 01/15/2021   BILITOT 0.3 01/15/2021   ALKPHOS 83 01/15/2021   AST 18 01/15/2021   ALT 21 01/15/2021   PROT 6.5 01/15/2021   ALBUMIN 4.2 01/15/2021  CALCIUM 9.6 01/15/2021   EGFR 81 01/15/2021   Lab Results  Component Value Date   CHOL 153 01/15/2021   Lab Results  Component Value Date   HDL 52 01/15/2021   Lab Results  Component Value Date   LDLCALC 76 01/15/2021   Lab Results  Component Value Date   TRIG 143 01/15/2021   Lab Results  Component Value Date   CHOLHDL 2.9 01/15/2021   Lab Results  Component Value Date   HGBA1C 8.9 (H) 01/15/2021      Assessment & Plan:   Problem List Items Addressed This Visit       Cardiovascular and Mediastinum   Obesity, diabetes, and hypertension syndrome ([place]) - Primary   Relevant Medications   Insulin Pen Needle (NOVOFINE PEN NEEDLE) 32G  X 6 MM MISC   Other Relevant Orders   Hemoglobin A1c   POCT URINALYSIS DIP (CLIN[place]K) (Completed) An individual care plan for diabetes was established and reinforced today.  The patient's status was assessed using clinical findings on exam, labs and diagnostic testing. Patient success at meeting goals based on disease specific evidence-based guidelines and found to be good controlled. Medications were assessed and patient's understanding of the medical issues , including barriers were assessed. Recommend adherence to a diabetic diet, a graduated exercise program, HgbA1c level is checked quarterly, and urine microalbumin performed yearly .  Annual mono-filament sensation testing performed. Lower blood pressure and control hyperlipidemia is important. Get annual eye exams and annual flu shots and smoking cessation discussed.  Self management goals were discussed.      Other   Paralysis ([place])   Relevant Orders   Ambulatory referral to Neurology Transient paralysis in AM.  No recurrance   Other Visit Diagnoses     Decubitus ulcer of back, stage 2 ([place])       Relevant Medications   clotrimazole-betamethasone (LOTRISONE) cream The decubitus ulcer hearing well    Acute cystitis without hematuria       Relevant Orders   Urine Culture Patient has recently treated uti, we recultured       Meds ordered this encounter  Medications   clotrimazole-betamethasone (LOTRISONE) cream    Sig: Apply topically 2 (two) times daily.    Dispense:  30 g    Refill:  3   Insulin Pen Needle (NOVOFINE PEN NEEDLE) 32G X 6 MM MISC    Sig: 1 each by Does not apply route every 7 (seven) days.    Dispense:  50 each    Refill:  3    Follow-up: Return in about 3 weeks (around 05/14/2021) for dm.    Reinaldo Meeker, MD

## 2021-04-24 LAB — HEMOGLOBIN A1C
Est. average glucose Bld gHb Est-mCnc: 212 mg/dL
Hgb A1c MFr Bld: 9 % — ABNORMAL HIGH (ref 4.8–5.6)

## 2021-04-24 NOTE — Progress Notes (Signed)
A1c 9.0 high, need to increase ozempic to 0.5mg  a week,  lp

## 2021-04-25 ENCOUNTER — Encounter: Payer: Self-pay | Admitting: Neurology

## 2021-04-25 LAB — URINE CULTURE

## 2021-04-27 NOTE — Progress Notes (Signed)
Urine culture- mixed urogenital flora- no UTI lp

## 2021-05-14 ENCOUNTER — Other Ambulatory Visit: Payer: Self-pay

## 2021-05-14 ENCOUNTER — Encounter: Payer: Self-pay | Admitting: Legal Medicine

## 2021-05-14 ENCOUNTER — Ambulatory Visit (INDEPENDENT_AMBULATORY_CARE_PROVIDER_SITE_OTHER): Payer: Medicare HMO | Admitting: Legal Medicine

## 2021-05-14 VITALS — BP 136/80 | HR 84 | Temp 97.6°F | Ht 64.0 in | Wt 249.0 lb

## 2021-05-14 DIAGNOSIS — E669 Obesity, unspecified: Secondary | ICD-10-CM | POA: Diagnosis not present

## 2021-05-14 DIAGNOSIS — I152 Hypertension secondary to endocrine disorders: Secondary | ICD-10-CM | POA: Diagnosis not present

## 2021-05-14 DIAGNOSIS — E1169 Type 2 diabetes mellitus with other specified complication: Secondary | ICD-10-CM

## 2021-05-14 DIAGNOSIS — E1159 Type 2 diabetes mellitus with other circulatory complications: Secondary | ICD-10-CM

## 2021-05-14 MED ORDER — OZEMPIC (1 MG/DOSE) 4 MG/3ML ~~LOC~~ SOPN: 1.0000 mg | PEN_INJECTOR | SUBCUTANEOUS | 3 refills | Status: DC

## 2021-05-14 NOTE — Progress Notes (Signed)
Established Patient Office Visit  Subjective:  Patient ID: Alicia Kelly, female    DOB: 12-17-1955  Age: 65 y.o. MRN: [id number]  CC:  Chief Complaint  Patient presents with   Diabetes    States she is still having high readings and feels that the injectable medications have not been helping. Reports she only eats Meat and beans.    HPI Alicia Kelly presents for follow up of diabetes. Now on ozempic 0.5 mg, tresiba 28 units. Glucose 220 in AM. Increase to mg  Past Medical History:  Diagnosis Date   Asthma    Calculus of gallbladder with chronic cholecystitis without obstruction 03/22/2017   Diabetes ([place])    Epilepsy ([place])    Hypertension    Sleep apnea     Past Surgical History:  Procedure Laterality Date   APPENDECTOMY     TONSILLECTOMY     TONSILLECTOMY      Family History  Problem Relation Age of Onset   Colon cancer Mother        d. 53   Hypertension Father    Diabetes Father    Allergic rhinitis Sister    Hypertension Sister    Colon cancer Maternal Grandmother        dx 39s   Colon cancer Paternal Grandmother        dx 71s   Diabetes Paternal Grandmother    Cancer Maternal Uncle    Breast cancer Neg Hx     Social History   Socioeconomic History   Marital status: Married    Spouse name: Not on file   Number of children: Not on file   Years of education: Not on file   Highest education level: Not on file  Occupational History   Not on file  Tobacco Use   Smoking status: Never   Smokeless tobacco: Never  Vaping Use   Vaping Use: Never used  Substance and Sexual Activity   Alcohol use: Not Currently   Drug use: Never   Sexual activity: Not on file  Other Topics Concern   Not on file  Social History Narrative   Not on file   Social Determinants of Health   Financial Resource Strain: Low Risk    Difficulty of Paying Living Expenses: Not hard at all  Food Insecurity: No Food Insecurity   Worried About Charity fundraiser in the Last  Year: Never true   [place] in the Last Year: Never true  Transportation Needs: No Transportation Needs   Lack of Transportation (Medical): No   Lack of Transportation (Non-Medical): No  Physical Activity: Insufficiently Active   Days of Exercise per Week: 2 days   Minutes of Exercise per Session: 20 min  Stress: No Stress Concern Present   Feeling of Stress : Not at all  Social Connections: Moderately Isolated   Frequency of Communication with Friends and Family: More than three times a week   Frequency of Social Gatherings with Friends and Family: Never   Attends Religious Services: Never   Marine scientist or Organizations: No   Attends Music therapist: Never   Marital Status: Married  Human resources officer Violence: Not At Risk   Fear of Current or Ex-Partner: No   Emotionally Abused: No   Physically Abused: No   Sexually Abused: No    Outpatient Medications Prior to Visit  Medication Sig Dispense Refill   Accu-Chek Softclix Lancets lancets TEST BLOOD SUGAR THREE TIMES DAILY 300 each  1   atorvastatin (LIPITOR) 40 MG tablet Take 1 tablet (40 mg total) by mouth daily. 90 tablet 1   Blood Glucose Calibration (ACCU-CHEK AVIVA) SOLN      Blood Glucose Monitoring Suppl (ACCU-CHEK AVIVA PLUS) w/Device KIT 1 each by Does not apply route 3 (three) times daily. 1 kit 0   clotrimazole-betamethasone (LOTRISONE) cream Apply topically 2 (two) times daily. 30 g 3   cyclobenzaprine (FLEXERIL) 10 MG tablet Take 1 tablet (10 mg total) by mouth 3 (three) times daily as needed for muscle spasms. 30 tablet 0   glucose blood (ACCU-CHEK AVIVA PLUS) test strip 1 each by Other route 3 (three) times daily. Use as instructed 300 strip 2   Insulin Pen Needle (NOVOFINE PEN NEEDLE) 32G X 6 MM MISC 1 each by Does not apply route every 7 (seven) days. 50 each 3   lacosamide (VIMPAT) 200 MG TABS tablet Take 1.5 tablets (300 mg total) by mouth 2 (two) times daily. 180 tablet 1    levETIRAcetam (KEPPRA) 1000 MG tablet Take 1,000 mg by mouth in the morning, at noon, and at bedtime.     losartan-hydrochlorothiazide (HYZAAR) 100-25 MG tablet Take 1 tablet by mouth daily. 90 tablet 1   montelukast (SINGULAIR) 10 MG tablet Take 1 tablet (10 mg total) by mouth daily. as directed 90 tablet 2   pantoprazole (PROTONIX) 40 MG tablet TAKE 1 TABLET TWICE DAILY 180 tablet 1   TRESIBA FLEXTOUCH 100 UNIT/ML FlexTouch Pen Inject 26 Units into the skin daily. 15 mL 5   glipiZIDE (GLUCOTROL) 5 MG tablet Take 1 tablet (5 mg total) by mouth 2 (two) times daily. 180 tablet 2   Semaglutide,0.25 or 0.5MG/DOS, (OZEMPIC, 0.25 OR 0.5 MG/DOSE,) 2 MG/1.5ML SOPN Inject 0.5 mg into the skin once a week. (Patient not taking: Reported on 04/23/2021) 6 mL 3   No facility-administered medications prior to visit.    Allergies  Allergen Reactions   Levofloxacin Other (See Comments)    Throat swelling   Lisinopril Hives   Nitrofurantoin Swelling    Throat swelling   Penicillins Anaphylaxis   Clarithromycin Other (See Comments)    seizures   Sulfamethoxazole-Trimethoprim     Causes seizures    ROS Review of Systems  Constitutional:  Negative for activity change and appetite change.  HENT:  Positive for congestion.   Eyes:  Negative for visual disturbance.  Respiratory:  Negative for chest tightness and shortness of breath.   Cardiovascular:  Negative for chest pain, palpitations and leg swelling.  Gastrointestinal:  Negative for abdominal distention and abdominal pain.  Endocrine: Positive for polyuria.  Genitourinary:  Negative for difficulty urinating and dyspareunia.  Musculoskeletal:  Negative for arthralgias and back pain.  Neurological: Negative.   Psychiatric/Behavioral: Negative.       Objective:    Physical Exam Vitals reviewed.  Constitutional:      General: She is not in acute distress.    Appearance: Normal appearance. She is obese.  HENT:     Right Ear: Tympanic membrane  normal.     Left Ear: Tympanic membrane normal.  Eyes:     Extraocular Movements: Extraocular movements intact.     Conjunctiva/sclera: Conjunctivae normal.     Pupils: Pupils are equal, round, and reactive to light.  Cardiovascular:     Rate and Rhythm: Normal rate and regular rhythm.     Pulses: Normal pulses.     Heart sounds: Normal heart sounds. No murmur heard.   No gallop.  Pulmonary:     Effort: Pulmonary effort is normal. No respiratory distress.     Breath sounds: Normal breath sounds. No wheezing.  Abdominal:     General: Abdomen is flat. Bowel sounds are normal. There is no distension.     Palpations: Abdomen is soft.     Tenderness: There is no abdominal tenderness.  Skin:    General: Skin is warm.     Capillary Refill: Capillary refill takes less than 2 seconds.  Neurological:     General: No focal deficit present.     Mental Status: She is alert and oriented to person, place, and time.    BP 136/80    Pulse 84    Temp 97.6 F (36.4 C)    Ht _0  (1.626 m)    Wt 249 lb (112.9 kg)    LMP  (LMP Unknown)    SpO2 97%    BMI 42.74 kg/m  Wt Readings from Last 3 Encounters:  05/14/21 249 lb (112.9 kg)  04/23/21 253 lb (114.8 kg)  01/15/21 250 lb (113.4 kg)     Health Maintenance Due  Topic Date Due   OPHTHALMOLOGY EXAM  Never done   HIV Screening  Never done   Hepatitis C Screening  Never done   PAP SMEAR-Modifier  Never done   Zoster Vaccines- Shingrix (1 of 2) Never done   INFLUENZA VACCINE  12/16/2020   COVID-19 Vaccine (5 - Booster for Moderna series) 01/03/2021    There are no preventive care reminders to display for this patient.  No results found for: TSH Lab Results  Component Value Date   WBC 10.3 01/15/2021   HGB 11.2 01/15/2021   HCT 35.2 01/15/2021   MCV 88 01/15/2021   PLT 438 01/15/2021   Lab Results  Component Value Date   NA 136 01/15/2021   K 4.7 01/15/2021   CO2 24 01/15/2021   GLUCOSE 257 (H) 01/15/2021   BUN 17 01/15/2021    CREATININE 0.81 01/15/2021   BILITOT 0.3 01/15/2021   ALKPHOS 83 01/15/2021   AST 18 01/15/2021   ALT 21 01/15/2021   PROT 6.5 01/15/2021   ALBUMIN 4.2 01/15/2021   CALCIUM 9.6 01/15/2021   EGFR 81 01/15/2021   Lab Results  Component Value Date   CHOL 153 01/15/2021   Lab Results  Component Value Date   HDL 52 01/15/2021   Lab Results  Component Value Date   LDLCALC 76 01/15/2021   Lab Results  Component Value Date   TRIG 143 01/15/2021   Lab Results  Component Value Date   CHOLHDL 2.9 01/15/2021   Lab Results  Component Value Date   HGBA1C 9.0 (H) 04/23/2021      Assessment & Plan:   Problem List Items Addressed This Visit       Cardiovascular and Mediastinum   Obesity, diabetes, and hypertension syndrome ([place]) - Primary   Relevant Medications   Semaglutide, 1 MG/DOSE, (OZEMPIC, 1 MG/DOSE,) 4 MG/3ML SOPN A1c high, increase ozempic to 34m every week    Meds ordered this encounter  Medications   Semaglutide, 1 MG/DOSE, (OZEMPIC, 1 MG/DOSE,) 4 MG/3ML SOPN    Sig: Inject 1 mg into the skin once a week.    Dispense:  12 mL    Refill:  3    Follow-up: Return in about 3 months (around 08/12/2021) for diabetes.    LReinaldo Meeker MD

## 2021-05-20 ENCOUNTER — Telehealth: Payer: Medicare HMO

## 2021-05-20 ENCOUNTER — Telehealth: Payer: Self-pay

## 2021-05-20 DIAGNOSIS — R1013 Epigastric pain: Secondary | ICD-10-CM | POA: Diagnosis not present

## 2021-05-20 DIAGNOSIS — K2901 Acute gastritis with bleeding: Secondary | ICD-10-CM | POA: Diagnosis not present

## 2021-05-20 DIAGNOSIS — Z8711 Personal history of peptic ulcer disease: Secondary | ICD-10-CM | POA: Diagnosis not present

## 2021-05-20 DIAGNOSIS — R110 Nausea: Secondary | ICD-10-CM | POA: Diagnosis not present

## 2021-05-20 NOTE — Telephone Encounter (Signed)
°  Care Management   Follow Up Note   05/20/2021 Name: Alicia Kelly MRN: [id number] DOB: 1955/10/25   Referred by: Lillard Anes, MD Reason for referral : Chronic Care Management   An unsuccessful telephone outreach was attempted today. The patient was referred to the case management team for assistance with care management and care coordination.   Follow Up Plan: We have been unable to make contact with the patient for follow up. The care management team is available to follow up with the patient after provider conversation with the patient regarding recommendation for care management engagement and subsequent re-referral to the care management team.   [place] Constable, Pharm.D. - [phone]

## 2021-05-21 ENCOUNTER — Ambulatory Visit: Payer: Medicare HMO | Admitting: Legal Medicine

## 2021-05-23 ENCOUNTER — Telehealth: Payer: Self-pay

## 2021-05-23 ENCOUNTER — Ambulatory Visit: Payer: Medicare HMO | Admitting: Legal Medicine

## 2021-05-23 NOTE — Progress Notes (Signed)
° ° °  Chronic Care Management Pharmacy Assistant   Name: Tyneisha Hegeman  MRN: [id number] DOB: Jun 27, 1955   Reason for Encounter: Reminder call for appointment 05/29/2021 at  3:00 pm. Confirmed with patient  [place] Pharmacist Assistant [phone]

## 2021-05-28 ENCOUNTER — Other Ambulatory Visit: Payer: Self-pay | Admitting: Legal Medicine

## 2021-05-28 ENCOUNTER — Other Ambulatory Visit: Payer: Self-pay

## 2021-05-28 ENCOUNTER — Telehealth: Payer: Medicare HMO

## 2021-05-28 DIAGNOSIS — E669 Obesity, unspecified: Secondary | ICD-10-CM

## 2021-05-28 DIAGNOSIS — I152 Hypertension secondary to endocrine disorders: Secondary | ICD-10-CM

## 2021-05-28 DIAGNOSIS — E119 Type 2 diabetes mellitus without complications: Secondary | ICD-10-CM

## 2021-05-28 MED ORDER — TRESIBA FLEXTOUCH 100 UNIT/ML ~~LOC~~ SOPN: 26.0000 [IU] | PEN_INJECTOR | Freq: Every day | SUBCUTANEOUS | 2 refills | Status: DC

## 2021-05-28 MED ORDER — ACCU-CHEK AVIVA PLUS VI STRP: 1.0000 | ORAL_STRIP | Freq: Three times a day (TID) | 2 refills | Status: DC

## 2021-05-29 ENCOUNTER — Other Ambulatory Visit: Payer: Self-pay

## 2021-05-29 ENCOUNTER — Ambulatory Visit (INDEPENDENT_AMBULATORY_CARE_PROVIDER_SITE_OTHER): Payer: Medicare Other

## 2021-05-29 ENCOUNTER — Telehealth: Payer: Self-pay

## 2021-05-29 DIAGNOSIS — I10 Essential (primary) hypertension: Secondary | ICD-10-CM

## 2021-05-29 DIAGNOSIS — K2100 Gastro-esophageal reflux disease with esophagitis, without bleeding: Secondary | ICD-10-CM

## 2021-05-29 DIAGNOSIS — I152 Hypertension secondary to endocrine disorders: Secondary | ICD-10-CM

## 2021-05-29 DIAGNOSIS — E782 Mixed hyperlipidemia: Secondary | ICD-10-CM

## 2021-05-29 NOTE — Patient Instructions (Signed)
Visit Information   Goals Addressed   None    Patient Care Plan: Pharmacy Care Plan     Problem Identified: htn, hld, dm, gerd   Priority: High  Onset Date: 08/22/2020     Long-Range Goal: Disease Management   Start Date: 08/22/2020  Expected End Date: 08/22/2021  Recent Progress: On track  Priority: High  Note:    Current Barriers:  Unable to achieve control of blood sugar   Pharmacist Clinical Goal(s):  Patient will achieve control of diabetes as evidenced by a1c through collaboration with PharmD and provider.   Interventions: 1:1 collaboration with Lillard Anes, MD regarding development and update of comprehensive plan of care as evidenced by provider attestation and co-signature Inter-disciplinary care team collaboration (see longitudinal plan of care) Comprehensive medication review performed; medication list updated in electronic medical record  Hypertension (BP goal <130/80) BP Readings from Last 3 Encounters:  04/23/21 140/88  01/15/21 118/80  01/01/21 (!) 150/90  -Not Ideally Controlled -Current treatment: losartan-hydrochlorothizide 100-25 mg daily Appropriate, Query effective, Safe, Accessible -Medications previously tried: lisinopril, amlodipine, irbesartan-hctz -Current home readings: goes up with hot flash per patient. Checks it at home and is "good". ~120s/86 -Current dietary habits: states little appetite -Current exercise habits: walks and shops but no formal exercise -Denies hypotensive/hypertensive symptoms -Educated on BP goals and benefits of medications for prevention of heart attack, stroke and kidney damage; Daily salt intake goal < 2300 mg; Exercise goal of 150 minutes per week; Importance of home blood pressure monitoring; -Counseled to monitor BP at home weekly, document, and provide log at future appointments -Counseled on diet and exercise extensively December 2022: BP typically controlled but patient was still upset from possible MS  Dx and ER trip yesterday. Will re-assess in 3 weeks at PCP f/u Jan 2023: Patient very non-compliant on meds, (See note for fill dates). Main priority for today's visit is to onboard to [facility] so I can ensure compliance, will evaluate after that in Feb  Hyperlipidemia: (LDL goal < 70) The 10-year ASCVD risk score (Arnett DK, et al., 2019) is: 13.2%   Values used to calculate the score:     Age: 66 years     Sex: Female     Is Non-Hispanic African American: No     Diabetic: Yes     Tobacco smoker: No     Systolic Blood Pressure: 116 mmHg     Is BP treated: Yes     HDL Cholesterol: 52 mg/dL     Total Cholesterol: 153 mg/dL Lab Results  Component Value Date   CHOL 153 01/15/2021   CHOL 156 10/02/2020   CHOL 134 07/03/2020   Lab Results  Component Value Date   HDL 52 01/15/2021   HDL 61 10/02/2020   HDL 56 07/03/2020   Lab Results  Component Value Date   LDLCALC 76 01/15/2021   LDLCALC 75 10/02/2020   LDLCALC 57 07/03/2020   Lab Results  Component Value Date   TRIG 143 01/15/2021   TRIG 110 10/02/2020   TRIG 118 07/03/2020   Lab Results  Component Value Date   CHOLHDL 2.9 01/15/2021   CHOLHDL 2.6 10/02/2020   CHOLHDL 2.4 07/03/2020  No results found for: LDLDIRECT -Controlled -Current treatment: atorvastatin 40 mg daily Appropriate, Query effective, Safe, Accessible -Medications previously tried: none reported  -Current dietary patterns: reports little appetite -Current exercise habits: no formal exercise but encouraged patient to begin walking her loop with her neighbor -Educated on Cholesterol goals;  Benefits of statin for ASCVD risk reduction; Importance of limiting foods high in cholesterol; Exercise goal of 150 minutes per week; -Counseled on diet and exercise extensively Jan 2023: Patient very non-compliant on meds, (See note for fill dates). Main priority for today's visit is to onboard to [facility] so I can ensure compliance, will evaluate after that in  Feb  Diabetes (A1c goal <7%) Lab Results  Component Value Date   HGBA1C 8.9 (H) 01/15/2021   HGBA1C 7.9 (H) 10/02/2020   HGBA1C 8.2 (H) 07/03/2020   Lab Results  Component Value Date   MICROALBUR 10 07/03/2020   LDLCALC 76 01/15/2021   CREATININE 0.81 01/15/2021   Lab Results  Component Value Date   NA 136 01/15/2021   K 4.7 01/15/2021   CREATININE 0.81 01/15/2021   EGFR 81 01/15/2021   GFRNONAA 82 07/03/2020   GLUCOSE 257 (H) 01/15/2021   Lab Results  Component Value Date   WBC 10.3 01/15/2021   HGB 11.2 01/15/2021   HCT 35.2 01/15/2021   MCV 88 01/15/2021   PLT 438 01/15/2021  -Uncontrolled -Current medications: accu-chek softclix Appropriate, Effective, Safe, Accessible accu-chek aviva Appropriate, Effective, Safe, Accessible Pen needles daily Appropriate, Effective, Safe, Accessible tresiba flextouch 28 units daily Appropriate, Query effective, Safe, Accessible Ozempic Appropriate, Query effective, Safe, Accessible -Medications previously tried: metformin, Glipizide (Dc'd December 2022) -Current home glucose readings fasting glucose:  April 2022: 168, 202 Jan 2023: Didn't have readings on her post prandial glucose: not reported -Denies hypoglycemic/hyperglycemic symptoms -Current meal patterns: Doesn't eat bread or sandwiches. Less appetite lately.  breakfast: eggs, eats 1 banana a day lunch:  sandwich and banana dinner:  hamburger, cream potatoes and gravy snacks:  limited drinks:  water, tea,  -Current exercise:  walks, shops, enjoys dancing but hasn't been since COVID -Educated on A1c and blood sugar goals; Complications of diabetes including kidney damage, retinal damage, and cardiovascular disease; Exercise goal of 150 minutes per week; Benefits of routine self-monitoring of blood sugar; Carbohydrate counting and/or plate method -Counseled to check feet daily and get yearly eye exams -Counseled on diet and exercise extensively December 2022: Patient  is very upset about recent trip to ER and possible MS Dx. Because of this, didn't talk too much about sugars. She did state she has not picked up Ozempic because Pharmacy is unable to get/fill it. After speaking with patient, she expressed frustrations about having to take all the meds, organize them, keep track of refills, and have to make phone calls just to take a medicine she doesn't want. Explained [facility] process and she stated she'd like to start using in Jan once her new insurance starts. She wants to wait on the Ozempic until then Verbal consent obtained for [facility] enhanced pharmacy services (medication synchronization, adherence packaging, delivery coordination). A medication sync plan was created to allow patient to get all medications delivered once every 30 to 90 days per patient preference. Patient understands they have freedom to choose pharmacy and clinical pharmacist will coordinate care between all prescribers and [facility]. Jan 2023: Patient very non-compliant on meds, (See note for fill dates). Main priority for today's visit is to onboard to [facility] so I can ensure compliance, will evaluate after that in Feb   Epilepsy (Goal: seizure control) -Controlled -Current treatment  Vimpat 200 mg - 1.5 tablets twice daily Appropriate, Effective, Safe, Accessible Keppra 1000 mg tid Appropriate, Effective, Safe, Accessible -Medications previously tried: dilantin  -Recommended to continue current medication  GERD (Goal: manage symptoms  and prevent recurrence of stomach bleed) -Controlled -Current treatment  Pantoprazole 40 mg bid (Active bleeding ulcers) Appropriate, Effective, Safe, Accessible -Medications previously tried:  omeprazole, Zantac -Counseled on diet and exercise extensively Recommended to continue current medication Recommended patient follow-up with GI if symptoms reoccur.  -Patient will be on Pantoprazole indefinitely due to Hx of ulcers  Asthma  (Goal: manage symptoms and avoid falres) -Controlled -Current treatment  montelukast 10 mg daily as directed Appropriate, Effective, Safe, Accessible -Medications previously tried: none reported  -Recommended to continue current medication   Patient Goals/Self-Care Activities Patient will:  - take medications as prescribed focus on medication adherence by continuing to use daily pill box check glucose twice daily, document, and provide at future appointments target a minimum of 150 minutes of moderate intensity exercise weekly engage in dietary modifications by limiting carbohydrates/sugars and balanced meals  Follow Up Plan: Telephone follow up appointment with care management team member scheduled for:  Feb 2023  Arizona Constable, Pharm.D. - [phone]       The patient verbalized understanding of instructions, educational materials, and care plan provided today and declined offer to receive copy of patient instructions, educational materials, and care plan.  The pharmacy team will reach out to the patient again over the next 30 days.   Lane Hacker, [facility]

## 2021-05-29 NOTE — Progress Notes (Signed)
Chronic Care Management Pharmacy Note  05/29/2021 Name:  Alicia Kelly MRN:  [id number] DOB:  1955/11/27   Plan Recommendations:  Patient non-compliant on all meds (See bottom of note for fill Hx dates). Main priority was onboarding to [facility] so I can ensure compliance, then will f/u monthly until sugars under control  Subjective: Alicia Kelly is an 66 y.o. year old female who is a primary patient of Henrene Pastor, Zeb Comfort, MD.  The CCM team was consulted for assistance with disease management and care coordination needs.    Engaged with patient by telephone for follow up visit in response to provider referral for pharmacy case management and/or care coordination services.   Consent to Services:  The patient was given the following information about Chronic Care Management services today, agreed to services, and gave verbal consent: 1. CCM service includes personalized support from designated clinical staff supervised by the primary care provider, including individualized plan of care and coordination with other care providers 2. 24/7 contact phone numbers for assistance for urgent and routine care needs. 3. Service will only be billed when office clinical staff spend 20 minutes or more in a month to coordinate care. 4. Only one practitioner may furnish and bill the service in a calendar month. 5.The patient may stop CCM services at any time (effective at the end of the month) by phone call to the office staff. 6. The patient will be responsible for cost sharing (co-pay) of up to 20% of the service fee (after annual deductible is met). Patient agreed to services and consent obtained.  Patient Care Team: Lillard Anes, MD as PCP - General (Family Medicine) Lane Hacker, [facility] as Pharmacist (Pharmacist)  Recent office visits:  01/22/21 Orders Only. Started Ozempic 59m/1.5ml inject 0.5 into skin once weekly.    01/15/21 PReinaldo MeekerMD. Seen of obesity/diabetes. No med  changes.   01/01/21 PReinaldo MeekerMD. Seen for cough. No med changes.    12/16/20 Orders Only. D/C Mycolog 1 application 2 times daily due to change in therapy.   11/04/20 PReinaldo MeekerMD. Seen for hospitalization follow up. D/C Omeprazole 40 mg daily due to change in therapy.   Recent consult visits:  10/28/20 (Neurology) FMaurie BoettcherMD. Seen for Generalized Epilepsy. Ordered Vimpat 200 mg take 1.5  2 times daily. Ordered Keppra 1000 mg 1 tablet three times daily.   10/25/20 (Oncology) LLavera GuiseMD. Seen for Leukocytosis. No med changes.   Hospital visits:  615/22-10/1720 ED visit. No notes available   Objective:  Lab Results  Component Value Date   CREATININE 0.81 01/15/2021   BUN 17 01/15/2021   GFRNONAA 82 07/03/2020   GFRAA 95 07/03/2020   NA 136 01/15/2021   K 4.7 01/15/2021   CALCIUM 9.6 01/15/2021   CO2 24 01/15/2021   GLUCOSE 257 (H) 01/15/2021    Lab Results  Component Value Date/Time   HGBA1C 9.0 (H) 04/23/2021 11:35 AM   HGBA1C 8.9 (H) 01/15/2021 10:37 AM   MICROALBUR 10 07/03/2020 10:25 AM    Last diabetic Eye exam: No results found for: HMDIABEYEEXA  Last diabetic Foot exam: No results found for: HMDIABFOOTEX   Lab Results  Component Value Date   CHOL 153 01/15/2021   HDL 52 01/15/2021   LDLCALC 76 01/15/2021   TRIG 143 01/15/2021   CHOLHDL 2.9 01/15/2021    Hepatic Function Latest Ref Rng & Units 01/15/2021 11/04/2020 10/02/2020  Total Protein 6.0 - 8.5 g/dL 6.5 6.2 7.1  Albumin  3.8 - 4.8 g/dL 4.2 3.7(L) 4.5  AST 0 - 40 IU/L '18 13 9  ' ALT 0 - 32 IU/L '21 19 15  ' Alk Phosphatase 44 - 121 IU/L 83 58 85  Total Bilirubin 0.0 - 1.2 mg/dL 0.3 0.2 0.3    No results found for: TSH, FREET4  CBC Latest Ref Rng & Units 01/15/2021 10/25/2020 10/10/2020  WBC 3.4 - 10.8 x10E3/uL 10.3 9.5 13.9(H)  Hemoglobin 11.1 - 15.9 g/dL 11.2 11.7(A) 11.3  Hematocrit 34.0 - 46.6 % 35.2 35(A) 34.5  Platelets 150 - 450 x10E3/uL 438 389 483(H)    No results found  for: VD25OH  Clinical ASCVD: No  The 10-year ASCVD risk score (Arnett DK, et al., 2019) is: 12.5%   Values used to calculate the score:     Age: 40 years     Sex: Female     Is Non-Hispanic African American: No     Diabetic: Yes     Tobacco smoker: No     Systolic Blood Pressure: 932 mmHg     Is BP treated: Yes     HDL Cholesterol: 52 mg/dL     Total Cholesterol: 153 mg/dL    Depression screen PHQ 2/9 07/03/2020  Decreased Interest 0  Down, Depressed, Hopeless 0  PHQ - 2 Score 0      Social History   Tobacco Use  Smoking Status Never  Smokeless Tobacco Never   BP Readings from Last 3 Encounters:  05/14/21 136/80  04/23/21 140/88  01/15/21 118/80   Pulse Readings from Last 3 Encounters:  05/14/21 84  04/23/21 83  01/15/21 66   Wt Readings from Last 3 Encounters:  05/14/21 249 lb (112.9 kg)  04/23/21 253 lb (114.8 kg)  01/15/21 250 lb (113.4 kg)   BMI Readings from Last 3 Encounters:  05/14/21 42.74 kg/m  04/23/21 40.84 kg/m  01/15/21 40.35 kg/m    Assessment/Interventions: Review of patient past medical history, allergies, medications, health status, including review of consultants reports, laboratory and other test data, was performed as part of comprehensive evaluation and provision of chronic care management services.   SDOH:  (Social Determinants of Health) assessments and interventions performed: Yes   SDOH Screenings   Alcohol Screen: Low Risk    Last Alcohol Screening Score (AUDIT): 0  Depression (PHQ2-9): Low Risk    PHQ-2 Score: 0  Financial Resource Strain: Low Risk    Difficulty of Paying Living Expenses: Not hard at all  Food Insecurity: No Food Insecurity   Worried About Charity fundraiser in the Last Year: Never true   Ran Out of Food in the Last Year: Never true  Housing: Low Risk    Last Housing Risk Score: 0  Physical Activity: Insufficiently Active   Days of Exercise per Week: 2 days   Minutes of Exercise per Session: 20 min   Social Connections: Moderately Isolated   Frequency of Communication with Friends and Family: More than three times a week   Frequency of Social Gatherings with Friends and Family: Never   Attends Religious Services: Never   Marine scientist or Organizations: No   Attends Music therapist: Never   Marital Status: Married  Stress: No Stress Concern Present   Feeling of Stress : Not at all  Tobacco Use: Low Risk    Smoking Tobacco Use: Never   Smokeless Tobacco Use: Never   Passive Exposure: Not on file  Transportation Needs: No Transportation Needs   Lack of  Transportation (Medical): No   Lack of Transportation (Non-Medical): No    CCM Care Plan  Allergies  Allergen Reactions   Levofloxacin Other (See Comments)    Throat swelling   Lisinopril Hives   Nitrofurantoin Swelling    Throat swelling   Penicillins Anaphylaxis   Clarithromycin Other (See Comments)    seizures   Sulfamethoxazole-Trimethoprim     Causes seizures    Medications Reviewed Today     Reviewed by Lane Hacker, [facility] (Pharmacist) on 05/29/21 at 1501  Med List Status: <None>   Medication Order Taking? Sig Documenting Provider Last Dose Status Informant  Accu-Chek Softclix Lancets lancets [id number] Yes TEST BLOOD SUGAR THREE TIMES DAILY Lillard Anes, MD Taking Active   atorvastatin (LIPITOR) 40 MG tablet [id number] Yes Take 1 tablet (40 mg total) by mouth daily. Lillard Anes, MD Taking Active   B-D UF III MINI PEN NEEDLES 31G X 5 MM MISC [id number]  USE 1 PEN NEEDLE DAILY Lillard Anes, MD  Active   Blood Glucose Calibration ([place]) SOLN [id number] Yes  [provider] Taking Active   Blood Glucose Monitoring Suppl (ACCU-CHEK AVIVA PLUS) w/Device KIT [id number] No 1 each by Does not apply route 3 (three) times daily.  Patient not taking: Reported on 05/29/2021   Lillard Anes, MD Not Taking Consider Medication Status and Discontinue    clotrimazole-betamethasone (LOTRISONE) cream [id number] No Apply topically 2 (two) times daily.  Patient not taking: Reported on 05/29/2021   Lillard Anes, MD Not Taking Consider Medication Status and Discontinue   cyclobenzaprine (FLEXERIL) 10 MG tablet [id number] No Take 1 tablet (10 mg total) by mouth 3 (three) times daily as needed for muscle spasms.  Patient not taking: Reported on 05/29/2021   Lillard Anes, MD Not Taking Consider Medication Status and Discontinue   glucose blood (ACCU-CHEK AVIVA PLUS) test strip [id number] Yes 1 each by Other route 3 (three) times daily. Use as instructed Lillard Anes, MD Taking Active   Insulin Pen Needle (NOVOFINE PEN NEEDLE) 32G X 6 MM MISC [id number] Yes 1 each by Does not apply route every 7 (seven) days. Lillard Anes, MD Taking Active   lacosamide (VIMPAT) 200 MG TABS tablet [id number] Yes Take 1.5 tablets (300 mg total) by mouth 2 (two) times daily. Cox, Kirsten, MD Taking Active   levETIRAcetam (KEPPRA) 1000 MG tablet [id number] Yes Take 1,000 mg by mouth in the morning, at noon, and at bedtime. [provider] Taking Active   losartan-hydrochlorothiazide (HYZAAR) 100-25 MG tablet [id number] Yes Take 1 tablet by mouth daily. Lillard Anes, MD Taking Active   montelukast (SINGULAIR) 10 MG tablet [id number] Yes Take 1 tablet (10 mg total) by mouth daily. as directed Lillard Anes, MD Taking Active   pantoprazole ([place]) 40 MG tablet [id number] Yes TAKE 1 TABLET TWICE DAILY Lillard Anes, MD Taking Active   Semaglutide, 1 MG/DOSE, (OZEMPIC, 1 MG/DOSE,) 4 MG/3ML SOPN [id number] No Inject 1 mg into the skin once a week.  Patient not taking: Reported on 05/29/2021   Lillard Anes, MD Not Taking Active   TRESIBA FLEXTOUCH 100 UNIT/ML FlexTouch Pen [id number] Yes Inject 26 Units into the skin daily. Lillard Anes, MD Taking Active             Patient Active Problem List    Diagnosis Date Noted   Paralysis ([place]) 04/23/2021   BMI 40.0-44.9, adult ([place]) 01/15/2021   Morbid obesity ([place]) 01/15/2021  Leukocytosis 11/10/2020   Left-sided weakness 09/11/2020   Recurrent falls 09/11/2020   Back pain 09/05/2020   Obesity, diabetes, and hypertension syndrome ([place]) 07/03/2020   Complex partial seizures ([place]) 04/09/2020   Chest pain 08/01/2018   Essential hypertension 08/01/2018   Hyperlipidemia 08/01/2018   Coronary artery calcification seen on CT scan 08/01/2018   Postmenopause 04/30/2017   Lumbosacral radiculopathy at L4 01/07/2017   GERD (gastroesophageal reflux disease) 11/29/2016   Sleep apnea 11/29/2016   Sinus bradycardia 02/13/2016   Generalized epilepsy ([place]) 01/05/2013   Partial epilepsy with impairment of consciousness, with intractable epilepsy ([place]) 01/05/2013    Immunization History  Administered Date(s) Administered   Moderna Sars-Covid-2 Vaccination 09/15/2019, 10/13/2019, 05/15/2020, 11/08/2020   Pneumococcal Polysaccharide-23 10/02/2020   Tdap 04/04/2014    Conditions to be addressed/monitored:  Hypertension, Hyperlipidemia, Diabetes, Coronary Artery Disease, GERD and Epilepsy.   Care Plan : Pharmacy Care Plan  Updates made by Lane Hacker, RPH since 05/29/2021 12:00 AM     Problem: htn, hld, dm, gerd   Priority: High  Onset Date: 08/22/2020     Long-Range Goal: Disease Management   Start Date: 08/22/2020  Expected End Date: 08/22/2021  Recent Progress: On track  Priority: High  Note:    Current Barriers:  Unable to achieve control of blood sugar   Pharmacist Clinical Goal(s):  Patient will achieve control of diabetes as evidenced by a1c through collaboration with PharmD and provider.   Interventions: 1:1 collaboration with Lillard Anes, MD regarding development and update of comprehensive plan of care as evidenced by provider attestation and co-signature Inter-disciplinary care team collaboration (see  longitudinal plan of care) Comprehensive medication review performed; medication list updated in electronic medical record  Hypertension (BP goal <130/80) BP Readings from Last 3 Encounters:  04/23/21 140/88  01/15/21 118/80  01/01/21 (!) 150/90  -Not Ideally Controlled -Current treatment: losartan-hydrochlorothizide 100-25 mg daily Appropriate, Query effective, Safe, Accessible -Medications previously tried: lisinopril, amlodipine, irbesartan-hctz -Current home readings: goes up with hot flash per patient. Checks it at home and is "good". ~120s/86 -Current dietary habits: states little appetite -Current exercise habits: walks and shops but no formal exercise -Denies hypotensive/hypertensive symptoms -Educated on BP goals and benefits of medications for prevention of heart attack, stroke and kidney damage; Daily salt intake goal < 2300 mg; Exercise goal of 150 minutes per week; Importance of home blood pressure monitoring; -Counseled to monitor BP at home weekly, document, and provide log at future appointments -Counseled on diet and exercise extensively December 2022: BP typically controlled but patient was still upset from possible MS Dx and ER trip yesterday. Will re-assess in 3 weeks at PCP f/u Jan 2023: Patient very non-compliant on meds, (See note for fill dates). Main priority for today's visit is to onboard to [facility] so I can ensure compliance, will evaluate after that in Feb  Hyperlipidemia: (LDL goal < 70) The 10-year ASCVD risk score (Arnett DK, et al., 2019) is: 13.2%   Values used to calculate the score:     Age: 88 years     Sex: Female     Is Non-Hispanic African American: No     Diabetic: Yes     Tobacco smoker: No     Systolic Blood Pressure: 614 mmHg     Is BP treated: Yes     HDL Cholesterol: 52 mg/dL     Total Cholesterol: 153 mg/dL Lab Results  Component Value Date   CHOL 153 01/15/2021   CHOL 156 10/02/2020  CHOL 134 07/03/2020   Lab Results   Component Value Date   HDL 52 01/15/2021   HDL 61 10/02/2020   HDL 56 07/03/2020   Lab Results  Component Value Date   LDLCALC 76 01/15/2021   LDLCALC 75 10/02/2020   LDLCALC 57 07/03/2020   Lab Results  Component Value Date   TRIG 143 01/15/2021   TRIG 110 10/02/2020   TRIG 118 07/03/2020   Lab Results  Component Value Date   CHOLHDL 2.9 01/15/2021   CHOLHDL 2.6 10/02/2020   CHOLHDL 2.4 07/03/2020  No results found for: LDLDIRECT -Controlled -Current treatment: atorvastatin 40 mg daily Appropriate, Query effective, Safe, Accessible -Medications previously tried: none reported  -Current dietary patterns: reports little appetite -Current exercise habits: no formal exercise but encouraged patient to begin walking her loop with her neighbor -Educated on Cholesterol goals;  Benefits of statin for ASCVD risk reduction; Importance of limiting foods high in cholesterol; Exercise goal of 150 minutes per week; -Counseled on diet and exercise extensively Jan 2023: Patient very non-compliant on meds, (See note for fill dates). Main priority for today's visit is to onboard to [facility] so I can ensure compliance, will evaluate after that in Feb  Diabetes (A1c goal <7%) Lab Results  Component Value Date   HGBA1C 8.9 (H) 01/15/2021   HGBA1C 7.9 (H) 10/02/2020   HGBA1C 8.2 (H) 07/03/2020   Lab Results  Component Value Date   MICROALBUR 10 07/03/2020   LDLCALC 76 01/15/2021   CREATININE 0.81 01/15/2021   Lab Results  Component Value Date   NA 136 01/15/2021   K 4.7 01/15/2021   CREATININE 0.81 01/15/2021   EGFR 81 01/15/2021   GFRNONAA 82 07/03/2020   GLUCOSE 257 (H) 01/15/2021   Lab Results  Component Value Date   WBC 10.3 01/15/2021   HGB 11.2 01/15/2021   HCT 35.2 01/15/2021   MCV 88 01/15/2021   PLT 438 01/15/2021  -Uncontrolled -Current medications: accu-chek softclix Appropriate, Effective, Safe, Accessible accu-chek aviva Appropriate, Effective, Safe,  Accessible Pen needles daily Appropriate, Effective, Safe, Accessible tresiba flextouch 28 units daily Appropriate, Query effective, Safe, Accessible Ozempic Appropriate, Query effective, Safe, Accessible -Medications previously tried: metformin, Glipizide (Dc'd December 2022) -Current home glucose readings fasting glucose:  April 2022: 168, 202 Jan 2023: Didn't have readings on her post prandial glucose: not reported -Denies hypoglycemic/hyperglycemic symptoms -Current meal patterns: Doesn't eat bread or sandwiches. Less appetite lately.  breakfast: eggs, eats 1 banana a day lunch:  sandwich and banana dinner:  hamburger, cream potatoes and gravy snacks:  limited drinks:  water, tea,  -Current exercise:  walks, shops, enjoys dancing but hasn't been since COVID -Educated on A1c and blood sugar goals; Complications of diabetes including kidney damage, retinal damage, and cardiovascular disease; Exercise goal of 150 minutes per week; Benefits of routine self-monitoring of blood sugar; Carbohydrate counting and/or plate method -Counseled to check feet daily and get yearly eye exams -Counseled on diet and exercise extensively December 2022: Patient is very upset about recent trip to ER and possible MS Dx. Because of this, didn't talk too much about sugars. She did state she has not picked up Ozempic because Pharmacy is unable to get/fill it. After speaking with patient, she expressed frustrations about having to take all the meds, organize them, keep track of refills, and have to make phone calls just to take a medicine she doesn't want. Explained [facility] process and she stated she'd like to start using in Jan once her new  insurance starts. She wants to wait on the Ozempic until then Verbal consent obtained for [facility] enhanced pharmacy services (medication synchronization, adherence packaging, delivery coordination). A medication sync plan was created to allow patient to get all  medications delivered once every 30 to 90 days per patient preference. Patient understands they have freedom to choose pharmacy and clinical pharmacist will coordinate care between all prescribers and [facility]. Jan 2023: Patient very non-compliant on meds, (See note for fill dates). Main priority for today's visit is to onboard to [facility] so I can ensure compliance, will evaluate after that in Feb   Epilepsy (Goal: seizure control) -Controlled -Current treatment  Vimpat 200 mg - 1.5 tablets twice daily Appropriate, Effective, Safe, Accessible Keppra 1000 mg tid Appropriate, Effective, Safe, Accessible -Medications previously tried: dilantin  -Recommended to continue current medication  GERD (Goal: manage symptoms and prevent recurrence of stomach bleed) -Controlled -Current treatment  Pantoprazole 40 mg bid (Active bleeding ulcers) Appropriate, Effective, Safe, Accessible -Medications previously tried:  omeprazole, Zantac -Counseled on diet and exercise extensively Recommended to continue current medication Recommended patient follow-up with GI if symptoms reoccur.  -Patient will be on Pantoprazole indefinitely due to Hx of ulcers  Asthma (Goal: manage symptoms and avoid falres) -Controlled -Current treatment  montelukast 10 mg daily as directed Appropriate, Effective, Safe, Accessible -Medications previously tried: none reported  -Recommended to continue current medication   Patient Goals/Self-Care Activities Patient will:  - take medications as prescribed focus on medication adherence by continuing to use daily pill box check glucose twice daily, document, and provide at future appointments target a minimum of 150 minutes of moderate intensity exercise weekly engage in dietary modifications by limiting carbohydrates/sugars and balanced meals  Follow Up Plan: Telephone follow up appointment with care management team member scheduled for:  Feb 2023  [place] Constable,  Pharm.D. - [phone]       Medication Assistance: None required.  Patient affirms current coverage meets needs.  Adherence Review: Does the patient have >5 day gap between last estimated fill dates? No   Star Rating Drugs: Pt gets through mail order and stated has enough supply Medication Name      Last Fill          Days supply Atorvastatin                 09/23/20              90ds     Care Gaps: Last annual wellness visit? 07/26/15  Patient's preferred pharmacy is:  [facility], [place] [address] Phone: [phone] Fax: [phone]  [facility], [place] [address] Phone: [phone] Fax: [phone]   Uses pill box? No - uses small daily pill container but not weekly planner.  Pt endorses good compliance  We discussed: Benefits of medication synchronization, packaging and delivery as well as enhanced pharmacist oversight with [facility]. Patient decided to: Utilize [facility] pharmacy for medication synchronization, packaging and delivery starting Jan 2023 Verbal consent obtained for [facility] enhanced pharmacy services (medication synchronization, adherence packaging, delivery coordination). A medication sync plan was created to allow patient to get all medications delivered once every 30 to 90 days per patient preference. Patient understands they have freedom to choose pharmacy and clinical pharmacist will coordinate care between all prescribers and [facility].   Medication Name                        (  please note if Rx is PRN) Prescriber                                                                  (list Provider Name & Phone Number)                                  Timing    Refill Timing Last Fill Date & DS       (if last fill/DS unavailable, list pt.'s quantity on hand) Anticipated next due date    BB B L EM BT    Days Supply   TS/Lancets (Aviva plus) [place] - [phone]      Auto Fill 05/30/2021 90.00 08/28/21  Levetiracetam 1035m TID PHenrene Pastor- [phone] '1 1 1  ' Cycle Fill 01/30/2021 90.00 06/04/21  [place] 2047mPerry - [phone]  1.5  Cycle Fill 02/25/2021 60.00 06/04/21  [place] 109merry - [phone] 1  Cycle Fill 02/14/2021 90.00 06/04/21  Losartan/HCTZ 100/25 PerHenrene [phone]    Cycle Fill 02/25/2021 90.00 06/04/21  Pantoprazole 75m45mrry - [phone] 1  Cycle Fill 05/20/2021 90.00 08/18/21  Ozempic 1mg/63mk [phone] Auto Fill 05/22/2021 30.00 06/21/21  Tresiba 26 units QD PerryHenrene [phone] Auto Fill 01/31/2021 60.00 06/04/21  Atorvastatin 75mg 46mPerry Henrene Pastor-[phone] Fill 02/25/2021 90.00 06/04/21  Needles 32gx6mm Pe74m -Henrene [phone] Fill 01/30/2021 100.00 06/04/21    Care Plan and Follow Up Patient Decision:  Patient agrees to Care Plan and Follow-up.  Plan: Telephone follow up appointment with care management team member scheduled for:  Feb 2023  Suvan Stcyr [place] Constable.[id number]- [phone]

## 2021-05-29 NOTE — Telephone Encounter (Signed)
Error

## 2021-05-30 ENCOUNTER — Other Ambulatory Visit: Payer: Self-pay | Admitting: Family Medicine

## 2021-05-30 ENCOUNTER — Telehealth: Payer: Self-pay

## 2021-05-30 ENCOUNTER — Other Ambulatory Visit: Payer: Self-pay | Admitting: Legal Medicine

## 2021-05-30 DIAGNOSIS — E669 Obesity, unspecified: Secondary | ICD-10-CM

## 2021-05-30 MED ORDER — ACCU-CHEK GUIDE VI STRP: ORAL_STRIP | 3 refills | Status: DC

## 2021-05-30 NOTE — Chronic Care Management (AMB) (Signed)
° ° °  Chronic Care Management Pharmacy Assistant   Name: Alicia Kelly  MRN: [id number] DOB: 1956/03/23  Reason for Encounter: Prescription Change  05/30/2021- Patient called needing a new prescriptions for her test strips sent in to [facility], patient stated she picked up Accu-check test strips but they were Plus test strips and she has the Accu-check Guide meter, the strips will not fit in her meter and she has only 3 test strips left. Patient aware I will reach out to [place] [facility] to attempt to contact Dr Henrene Pastor for a new prescription to be sent to [facility] for the Accu-check Guide Test strips.     Medications: Outpatient Encounter Medications as of 05/30/2021  Medication Sig   Accu-Chek Softclix Lancets lancets TEST BLOOD SUGAR THREE TIMES DAILY   atorvastatin (LIPITOR) 40 MG tablet Take 1 tablet (40 mg total) by mouth daily.   B-D UF III MINI PEN NEEDLES 31G X 5 MM MISC USE 1 PEN NEEDLE DAILY   Blood Glucose Calibration (ACCU-CHEK AVIVA) SOLN    Blood Glucose Monitoring Suppl (ACCU-CHEK AVIVA PLUS) w/Device KIT 1 each by Does not apply route 3 (three) times daily. (Patient not taking: Reported on 05/29/2021)   clotrimazole-betamethasone (LOTRISONE) cream Apply topically 2 (two) times daily. (Patient not taking: Reported on 05/29/2021)   cyclobenzaprine (FLEXERIL) 10 MG tablet Take 1 tablet (10 mg total) by mouth 3 (three) times daily as needed for muscle spasms. (Patient not taking: Reported on 05/29/2021)   glucose blood (ACCU-CHEK AVIVA PLUS) test strip 1 each by Other route 3 (three) times daily. Use as instructed   Insulin Pen Needle (NOVOFINE PEN NEEDLE) 32G X 6 MM MISC 1 each by Does not apply route every 7 (seven) days.   lacosamide (VIMPAT) 200 MG TABS tablet Take 1.5 tablets (300 mg total) by mouth 2 (two) times daily.   levETIRAcetam (KEPPRA) 1000 MG tablet Take 1,000 mg by mouth in the morning, at noon, and at bedtime.   losartan-hydrochlorothiazide (HYZAAR) 100-25 MG  tablet Take 1 tablet by mouth daily.   montelukast (SINGULAIR) 10 MG tablet Take 1 tablet (10 mg total) by mouth daily. as directed   pantoprazole (PROTONIX) 40 MG tablet TAKE 1 TABLET TWICE DAILY   Semaglutide, 1 MG/DOSE, (OZEMPIC, 1 MG/DOSE,) 4 MG/3ML SOPN Inject 1 mg into the skin once a week. (Patient not taking: Reported on 05/29/2021)   TRESIBA FLEXTOUCH 100 UNIT/ML FlexTouch Pen Inject 26 Units into the skin daily.   No facility-administered encounter medications on file as of 05/30/2021.    Pattricia Boss, [place] Pharmacist Assistant [phone]

## 2021-05-30 NOTE — Telephone Encounter (Signed)
Got in touch with Dr. Tobie Poet who sent in new script of TS. Let patient know

## 2021-06-04 NOTE — Progress Notes (Signed)
Subjective:  Patient ID: Alicia Kelly, female    DOB: [birth year]  Age: 66 y.o. MRN: [id number]  Chief Complaint  Patient presents with   Diabetes   Hyperlipidemia   Hypertension    HPI  Diabetes: Patient is taking ozempic 1 mg weekly, tresiba 100 unit/ml 25 units daily.  Patient is still taking her glipizide and we discussed to stop that.  She is trying to stick to her diet but will probably need some more education.BS 140 in evening.  Hyperlipidemia: Currently taking atorvastatin 40 mg daily. Hypertension: Takes losartan-HCTZ 100-25 mg daily. BP is doing well  Epilepsy: Still couple weeks ago you had 1 taking levetiracetam 1000 mg TID, Vimpat 200 mg BID. No seizures for the last 2 weeks  Patient is seeing Dr. Melina Copa for GI bleed and will be examined soon for this.She is feeling fatigued. Check TSH  Patient has sleep apnea but not on CPAP.needs sleep study. Refer to Dr. Alcide Clever   Current Outpatient Medications on File Prior to Visit  Medication Sig Dispense Refill   Accu-Chek Softclix Lancets lancets TEST BLOOD SUGAR THREE TIMES DAILY 300 each 1   atorvastatin (LIPITOR) 40 MG tablet Take 1 tablet (40 mg total) by mouth daily. 90 tablet 1   B-D UF III MINI PEN NEEDLES 31G X 5 MM MISC USE 1 PEN NEEDLE DAILY 100 each 2   Blood Glucose Calibration (ACCU-CHEK AVIVA) SOLN      Blood Glucose Monitoring Suppl (ACCU-CHEK AVIVA PLUS) w/Device KIT 1 each by Does not apply route 3 (three) times daily. 1 kit 0   clotrimazole-betamethasone (LOTRISONE) cream Apply topically 2 (two) times daily. 30 g 3   cyclobenzaprine (FLEXERIL) 10 MG tablet Take 1 tablet (10 mg total) by mouth 3 (three) times daily as needed for muscle spasms. 30 tablet 0   glucose blood (ACCU-CHEK GUIDE) test strip Check sugars in am and before supper. 200 each 3   Insulin Pen Needle (NOVOFINE PEN NEEDLE) 32G X 6 MM MISC 1 each by Does not apply route every 7 (seven) days. 50 each 3   lacosamide (VIMPAT) 200 MG TABS  tablet Take 1.5 tablets (300 mg total) by mouth 2 (two) times daily. 180 tablet 1   levETIRAcetam (KEPPRA) 1000 MG tablet Take 1,000 mg by mouth in the morning, at noon, and at bedtime.     losartan-hydrochlorothiazide (HYZAAR) 100-25 MG tablet Take 1 tablet by mouth daily. 90 tablet 1   montelukast (SINGULAIR) 10 MG tablet Take 1 tablet (10 mg total) by mouth daily. as directed 90 tablet 2   pantoprazole (PROTONIX) 40 MG tablet TAKE 1 TABLET TWICE DAILY 180 tablet 1   Semaglutide, 1 MG/DOSE, (OZEMPIC, 1 MG/DOSE,) 4 MG/3ML SOPN Inject 1 mg into the skin once a week. 12 mL 3   TRESIBA FLEXTOUCH 100 UNIT/ML FlexTouch Pen Inject 26 Units into the skin daily. 15 mL 2   No current facility-administered medications on file prior to visit.   Past Medical History:  Diagnosis Date   Asthma    Calculus of gallbladder with chronic cholecystitis without obstruction 03/22/2017   Diabetes ([place])    Epilepsy ([place])    Hypertension    Sleep apnea    Past Surgical History:  Procedure Laterality Date   APPENDECTOMY     TONSILLECTOMY     TONSILLECTOMY      Family History  Problem Relation Age of Onset   Colon cancer Mother        d. 1  Hypertension Father    Diabetes Father    Allergic rhinitis Sister    Hypertension Sister    Colon cancer Maternal Grandmother        dx 65s   Colon cancer Paternal Grandmother        dx 54s   Diabetes Paternal Grandmother    Cancer Maternal Uncle    Breast cancer Neg Hx    Social History   Socioeconomic History   Marital status: Married    Spouse name: Not on file   Number of children: Not on file   Years of education: Not on file   Highest education level: Not on file  Occupational History   Not on file  Tobacco Use   Smoking status: Never   Smokeless tobacco: Never  Vaping Use   Vaping Use: Never used  Substance and Sexual Activity   Alcohol use: Not Currently   Drug use: Never   Sexual activity: Not on file  Other Topics Concern   Not on  file  Social History Narrative   Not on file   Social Determinants of Health   Financial Resource Strain: Low Risk    Difficulty of Paying Living Expenses: Not hard at all  Food Insecurity: No Food Insecurity   Worried About Charity fundraiser in the Last Year: Never true   [place] in the Last Year: Never true  Transportation Needs: No Transportation Needs   Lack of Transportation (Medical): No   Lack of Transportation (Non-Medical): No  Physical Activity: Insufficiently Active   Days of Exercise per Week: 2 days   Minutes of Exercise per Session: 20 min  Stress: No Stress Concern Present   Feeling of Stress : Not at all  Social Connections: Moderately Isolated   Frequency of Communication with Friends and Family: More than three times a week   Frequency of Social Gatherings with Friends and Family: Never   Attends Religious Services: Never   Marine scientist or Organizations: No   Attends Archivist Meetings: Never   Marital Status: Married    Review of Systems  Constitutional:  Negative for fatigue and fever.  HENT:  Negative for congestion, ear pain and sore throat.   Respiratory:  Negative for cough and shortness of breath.   Cardiovascular:  Negative for chest pain.  Gastrointestinal:  Positive for abdominal pain, blood in stool and constipation. Negative for diarrhea, nausea and vomiting.  Endocrine: Negative for polydipsia, polyphagia and polyuria.  Genitourinary:  Negative for dysuria and hematuria.  Musculoskeletal:  Negative for arthralgias, back pain and myalgias.  Neurological:  Negative for headaches.  Psychiatric/Behavioral:  Negative for dysphoric mood. The patient is not nervous/anxious.     Objective:  BP 138/82 (BP Location: Left Arm, Patient Position: Sitting, Cuff Size: Large)    Pulse 92    Temp 98.5 F (36.9 C) (Oral)    Resp 16    Wt 248 lb 12.8 oz (112.9 kg)    LMP  (LMP Unknown)    BMI 42.71 kg/m   BP/Weight 06/05/2021  05/14/2021 27/09/1698  Systolic BP 174 944 967  Diastolic BP 82 80 88  Wt. (Lbs) 248.8 249 253  BMI 42.71 42.74 40.84    Physical Exam Vitals reviewed.  Constitutional:      General: She is not in acute distress.    Appearance: Normal appearance. She is obese.  HENT:     Right Ear: Tympanic membrane normal.  Left Ear: Tympanic membrane normal.     Nose: Nose normal.     Mouth/Throat:     Mouth: Mucous membranes are moist.     Pharynx: Oropharynx is clear.  Eyes:     Conjunctiva/sclera: Conjunctivae normal.     Pupils: Pupils are equal, round, and reactive to light.  Cardiovascular:     Rate and Rhythm: Normal rate and regular rhythm.     Pulses: Normal pulses.     Heart sounds: Normal heart sounds. No murmur heard.   No gallop.  Pulmonary:     Effort: Pulmonary effort is normal. No respiratory distress.     Breath sounds: Normal breath sounds. No wheezing.  Abdominal:     General: Abdomen is flat. Bowel sounds are normal.     Tenderness: There is abdominal tenderness.  Musculoskeletal:     Cervical back: Normal range of motion and neck supple.  Skin:    General: Skin is warm.     Capillary Refill: Capillary refill takes less than 2 seconds.  Neurological:     General: No focal deficit present.     Mental Status: She is alert.     Motor: Weakness (left side) present.  Psychiatric:        Mood and Affect: Mood normal.        Thought Content: Thought content normal.    Diabetic Foot Exam - Simple   Simple Foot Form Diabetic Foot exam was performed with the following findings: Yes 06/05/2021  9:08 AM  Visual Inspection See comments: Yes Sensation Testing See comments: Yes Pulse Check Posterior Tibialis and Dorsalis pulse intact bilaterally: Yes Comments Bunion, no sensation in feet      Lab Results  Component Value Date   WBC 10.3 01/15/2021   HGB 11.2 01/15/2021   HCT 35.2 01/15/2021   PLT 438 01/15/2021   GLUCOSE 257 (H) 01/15/2021   CHOL 153  01/15/2021   TRIG 143 01/15/2021   HDL 52 01/15/2021   LDLCALC 76 01/15/2021   ALT 21 01/15/2021   AST 18 01/15/2021   NA 136 01/15/2021   K 4.7 01/15/2021   CL 98 01/15/2021   CREATININE 0.81 01/15/2021   BUN 17 01/15/2021   CO2 24 01/15/2021   HGBA1C 9.0 (H) 04/23/2021   MICROALBUR 10 07/03/2020      Assessment & Plan:   Problem List Items Addressed This Visit       Cardiovascular and Mediastinum   Essential hypertension - Primary   Relevant Orders   Comprehensive metabolic panel   CBC with Differential/Platelet An individual hypertension care plan was established and reinforced today.  The patient's status was assessed using clinical findings on exam and labs or diagnostic tests. The patient's success at meeting treatment goals on disease specific evidence-based guidelines and found to be well controlled. SELF MANAGEMENT: The patient and I together assessed ways to personally work towards obtaining the recommended goals. RECOMMENDATIONS: avoid decongestants found in common cold remedies, decrease consumption of alcohol, perform routine monitoring of BP with home BP cuff, exercise, reduction of dietary salt, take medicines as prescribed, try not to miss doses and quit smoking.  Regular exercise and maintaining a healthy weight is needed.  Stress reduction may help. A CLINICAL SUMMARY including written plan identify barriers to care unique to individual due to social or financial issues.  We attempt to mutually creat solutions for individual and family understanding.    Obesity, diabetes, and hypertension syndrome ([place])   Relevant Orders  Hemoglobin A1c   Microalbumin / creatinine urine ratio An individual care plan for diabetes was established and reinforced today.  The patient's status was assessed using clinical findings on exam, labs and diagnostic testing. Patient success at meeting goals based on disease specific evidence-based guidelines and found to be good  controlled. Medications were assessed and patient's understanding of the medical issues , including barriers were assessed. Recommend adherence to a diabetic diet, a graduated exercise program, HgbA1c level is checked quarterly, and urine microalbumin performed yearly .  Annual mono-filament sensation testing performed. Lower blood pressure and control hyperlipidemia is important. Get annual eye exams and annual flu shots and smoking cessation discussed.  Self management goals were discussed.      Respiratory   Sleep apnea   Relevant Orders   Ambulatory referral to Pulmonology Patient has sleep apnea and needs new sleep test to get CPAP     Digestive   GERD (gastroesophageal reflux disease) Plan of care was formulated today.  She is doing well.  A plan of care was formulated using patient exam, tests and other sources to optimize care using evidence based information.  Recommend no smoking, no eating after supper, avoid fatty foods, elevate Head of bed, avoid tight fitting clothing.  Continue on OTC.      Nervous and Auditory   Generalized epilepsy ([place]) Stable on medicines     Other   Hyperlipidemia   Relevant Orders   Lipid panel AN INDIVIDUAL CARE PLAN for hyperlipidemia/ cholesterol was established and reinforced today.  The patient's status was assessed using clinical findings on exam, lab and other diagnostic tests. The patient's disease status was assessed based on evidence-based guidelines and found to be well controlled. MEDICATIONS were reviewed. SELF MANAGEMENT GOALS have been discussed and patient's success at attaining the goal of low cholesterol was assessed. RECOMMENDATION given include regular exercise 3 days a week and low cholesterol/low fat diet. CLINICAL SUMMARY including written plan to identify barriers unique to the patient due to social or economic  reasons was discussed.    BMI 40.0-44.9, adult [facility]) An individualize plan was formulated for obesity using patient  history and physical exam to encourage weight loss.  An evidence based program was formulated.  Patient is to cut portion size with meals and to plan physical exercise 3 days a week at least 20 minutes.  Weight watchers and other programs are helpful.  Planned amount of weight loss 10 lbs. With DM and hypertension, she meets criteria for morbid obesity    Morbid obesity ([place]) An individualize plan was formulated for obesity using patient history and physical exam to encourage weight loss.  An evidence based program was formulated.  Patient is to cut ortion size with meals and to plan physical exercise 3 days a week at least 20 minutes.  Weight watchers and other programs are helpful.  Planned amount of weight loss 10 lbs.    Paralysis ([place]) Weakness remains from old stroke   Other Visit Diagnoses     Need for vaccination       Relevant Orders   Flu Vaccine MDCK QUAD PF (Completed)   Other fatigue       Relevant Orders   TSH Patient fatigued, need workup for possible thyroid problem     .    Orders Placed This Encounter  Procedures   Flu Vaccine MDCK QUAD PF   Comprehensive metabolic panel   Hemoglobin A1c   Microalbumin / creatinine urine ratio   Lipid  panel   CBC with Differential/Platelet   TSH   Ambulatory referral to Pulmonology    30 minutes visit with review of old records Follow-up: Return in about 4 months (around 10/03/2021) for fasting.  An After Visit Summary was printed and given to the patient.  Reinaldo Meeker, MD [facility] [phone]

## 2021-06-05 ENCOUNTER — Other Ambulatory Visit: Payer: Self-pay

## 2021-06-05 ENCOUNTER — Ambulatory Visit (INDEPENDENT_AMBULATORY_CARE_PROVIDER_SITE_OTHER): Payer: Commercial Managed Care - HMO | Admitting: Legal Medicine

## 2021-06-05 ENCOUNTER — Encounter: Payer: Self-pay | Admitting: Legal Medicine

## 2021-06-05 VITALS — BP 138/82 | HR 92 | Temp 98.5°F | Resp 16 | Wt 248.8 lb

## 2021-06-05 DIAGNOSIS — K2100 Gastro-esophageal reflux disease with esophagitis, without bleeding: Secondary | ICD-10-CM

## 2021-06-05 DIAGNOSIS — I152 Hypertension secondary to endocrine disorders: Secondary | ICD-10-CM | POA: Diagnosis not present

## 2021-06-05 DIAGNOSIS — Z23 Encounter for immunization: Secondary | ICD-10-CM | POA: Diagnosis not present

## 2021-06-05 DIAGNOSIS — G40309 Generalized idiopathic epilepsy and epileptic syndromes, not intractable, without status epilepticus: Secondary | ICD-10-CM

## 2021-06-05 DIAGNOSIS — R5383 Other fatigue: Secondary | ICD-10-CM

## 2021-06-05 DIAGNOSIS — G4733 Obstructive sleep apnea (adult) (pediatric): Secondary | ICD-10-CM

## 2021-06-05 DIAGNOSIS — E1159 Type 2 diabetes mellitus with other circulatory complications: Secondary | ICD-10-CM

## 2021-06-05 DIAGNOSIS — I10 Essential (primary) hypertension: Secondary | ICD-10-CM | POA: Diagnosis not present

## 2021-06-05 DIAGNOSIS — E782 Mixed hyperlipidemia: Secondary | ICD-10-CM | POA: Diagnosis not present

## 2021-06-05 DIAGNOSIS — D649 Anemia, unspecified: Secondary | ICD-10-CM | POA: Diagnosis not present

## 2021-06-05 DIAGNOSIS — E1169 Type 2 diabetes mellitus with other specified complication: Secondary | ICD-10-CM

## 2021-06-05 DIAGNOSIS — G839 Paralytic syndrome, unspecified: Secondary | ICD-10-CM | POA: Diagnosis not present

## 2021-06-05 DIAGNOSIS — E669 Obesity, unspecified: Secondary | ICD-10-CM

## 2021-06-05 DIAGNOSIS — I2510 Atherosclerotic heart disease of native coronary artery without angina pectoris: Secondary | ICD-10-CM

## 2021-06-05 DIAGNOSIS — T7840XD Allergy, unspecified, subsequent encounter: Secondary | ICD-10-CM

## 2021-06-05 DIAGNOSIS — Z6841 Body Mass Index (BMI) 40.0 and over, adult: Secondary | ICD-10-CM

## 2021-06-05 MED ORDER — OZEMPIC (1 MG/DOSE) 4 MG/3ML ~~LOC~~ SOPN: 1.0000 mg | PEN_INJECTOR | SUBCUTANEOUS | 3 refills | Status: DC

## 2021-06-05 MED ORDER — NOVOFINE PEN NEEDLE 32G X 6 MM MISC: 1.0000 | 3 refills | Status: DC

## 2021-06-05 MED ORDER — TRESIBA FLEXTOUCH 100 UNIT/ML ~~LOC~~ SOPN: 26.0000 [IU] | PEN_INJECTOR | Freq: Every day | SUBCUTANEOUS | 2 refills | Status: DC

## 2021-06-05 MED ORDER — PANTOPRAZOLE SODIUM 40 MG PO TBEC: 40.0000 mg | DELAYED_RELEASE_TABLET | Freq: Two times a day (BID) | ORAL | 1 refills | Status: DC

## 2021-06-05 MED ORDER — ATORVASTATIN CALCIUM 40 MG PO TABS: 40.0000 mg | ORAL_TABLET | Freq: Every day | ORAL | 2 refills | Status: DC

## 2021-06-05 MED ORDER — LOSARTAN POTASSIUM-HCTZ 100-25 MG PO TABS: 1.0000 | ORAL_TABLET | Freq: Every day | ORAL | 2 refills | Status: DC

## 2021-06-05 MED ORDER — LACOSAMIDE 200 MG PO TABS: 300.0000 mg | ORAL_TABLET | Freq: Two times a day (BID) | ORAL | 2 refills | Status: DC

## 2021-06-05 MED ORDER — MONTELUKAST SODIUM 10 MG PO TABS: 10.0000 mg | ORAL_TABLET | Freq: Every day | ORAL | 2 refills | Status: DC

## 2021-06-05 MED ORDER — LEVETIRACETAM 1000 MG PO TABS: 1000.0000 mg | ORAL_TABLET | Freq: Three times a day (TID) | ORAL | 6 refills | Status: DC

## 2021-06-06 LAB — CBC WITH DIFFERENTIAL/PLATELET
Basophils Absolute: 0.1 10*3/uL (ref 0.0–0.2)
Basos: 0 %
EOS (ABSOLUTE): 0.3 10*3/uL (ref 0.0–0.4)
Eos: 2 %
Hematocrit: 30 % — ABNORMAL LOW (ref 34.0–46.6)
Hemoglobin: 9.1 g/dL — ABNORMAL LOW (ref 11.1–15.9)
Immature Grans (Abs): 0 10*3/uL (ref 0.0–0.1)
Immature Granulocytes: 0 %
Lymphocytes Absolute: 1.8 10*3/uL (ref 0.7–3.1)
Lymphs: 13 %
MCH: 23.5 pg — ABNORMAL LOW (ref 26.6–33.0)
MCHC: 30.3 g/dL — ABNORMAL LOW (ref 31.5–35.7)
MCV: 78 fL — ABNORMAL LOW (ref 79–97)
Monocytes Absolute: 1 10*3/uL — ABNORMAL HIGH (ref 0.1–0.9)
Monocytes: 8 %
Neutrophils Absolute: 10.1 10*3/uL — ABNORMAL HIGH (ref 1.4–7.0)
Neutrophils: 77 %
Platelets: 720 10*3/uL — ABNORMAL HIGH (ref 150–450)
RBC: 3.87 x10E6/uL (ref 3.77–5.28)
RDW: 15.1 % (ref 11.7–15.4)
WBC: 13.3 10*3/uL — ABNORMAL HIGH (ref 3.4–10.8)

## 2021-06-06 LAB — COMPREHENSIVE METABOLIC PANEL
ALT: 17 IU/L (ref 0–32)
AST: 18 IU/L (ref 0–40)
Albumin/Globulin Ratio: 1.8 (ref 1.2–2.2)
Albumin: 4.2 g/dL (ref 3.8–4.8)
Alkaline Phosphatase: 110 IU/L (ref 44–121)
BUN/Creatinine Ratio: 17 (ref 12–28)
BUN: 16 mg/dL (ref 8–27)
Bilirubin Total: 0.2 mg/dL (ref 0.0–1.2)
CO2: 22 mmol/L (ref 20–29)
Calcium: 10.2 mg/dL (ref 8.7–10.3)
Chloride: 96 mmol/L (ref 96–106)
Creatinine, Ser: 0.95 mg/dL (ref 0.57–1.00)
Globulin, Total: 2.3 g/dL (ref 1.5–4.5)
Glucose: 253 mg/dL — ABNORMAL HIGH (ref 70–99)
Potassium: 5.3 mmol/L — ABNORMAL HIGH (ref 3.5–5.2)
Sodium: 134 mmol/L (ref 134–144)
Total Protein: 6.5 g/dL (ref 6.0–8.5)
eGFR: 67 mL/min/{1.73_m2} (ref >59)

## 2021-06-06 LAB — LIPID PANEL
Chol/HDL Ratio: 2.8 ratio (ref 0.0–4.4)
Cholesterol, Total: 135 mg/dL (ref 100–199)
HDL: 49 mg/dL (ref >39)
LDL Chol Calc (NIH): 67 mg/dL (ref 0–99)
Triglycerides: 106 mg/dL (ref 0–149)
VLDL Cholesterol Cal: 19 mg/dL (ref 5–40)

## 2021-06-06 LAB — MICROALBUMIN / CREATININE URINE RATIO
Creatinine, Urine: 58.4 mg/dL
Microalb/Creat Ratio: <5 mg/g creat (ref 0–29)
Microalbumin, Urine: <3 ug/mL

## 2021-06-06 LAB — HEMOGLOBIN A1C
Est. average glucose Bld gHb Est-mCnc: 209 mg/dL
Hgb A1c MFr Bld: 8.9 % — ABNORMAL HIGH (ref 4.8–5.6)

## 2021-06-06 LAB — TSH: TSH: 1.17 u[IU]/mL (ref 0.450–4.500)

## 2021-06-06 LAB — CARDIOVASCULAR RISK ASSESSMENT

## 2021-06-06 NOTE — Progress Notes (Signed)
Tsh 1.17 normal,  lp

## 2021-06-08 NOTE — Progress Notes (Signed)
Glucose 253, kidney tests normal, potassium high 5.3, recheck one week, liver tests normal, A1c8.9 very high, get dietary counseling, increase ozempic to 1mg  every week, wbc high any infection?, hemoglobin and hematocrit lower, give 3 hemoccults, add ferritin level for iron deficiency, microalbuminuria normal lp

## 2021-06-09 ENCOUNTER — Other Ambulatory Visit: Payer: Self-pay

## 2021-06-09 DIAGNOSIS — E875 Hyperkalemia: Secondary | ICD-10-CM

## 2021-06-09 NOTE — Progress Notes (Signed)
She can wait on hemoccults lp

## 2021-06-10 ENCOUNTER — Other Ambulatory Visit: Payer: Self-pay

## 2021-06-10 DIAGNOSIS — E669 Obesity, unspecified: Secondary | ICD-10-CM

## 2021-06-10 LAB — SPECIMEN STATUS REPORT

## 2021-06-10 LAB — FERRITIN: Ferritin: 11 ng/mL — ABNORMAL LOW (ref 15–150)

## 2021-06-10 MED ORDER — ACCU-CHEK GUIDE VI STRP: ORAL_STRIP | 3 refills | Status: DC

## 2021-06-11 ENCOUNTER — Other Ambulatory Visit: Payer: Self-pay

## 2021-06-11 ENCOUNTER — Other Ambulatory Visit: Payer: Medicare Other

## 2021-06-11 DIAGNOSIS — E875 Hyperkalemia: Secondary | ICD-10-CM

## 2021-06-11 NOTE — Progress Notes (Signed)
Low iron level, start iron 300mg  qd lp

## 2021-06-12 LAB — COMPREHENSIVE METABOLIC PANEL
ALT: 13 IU/L (ref 0–32)
AST: 13 IU/L (ref 0–40)
Albumin/Globulin Ratio: 1.7 (ref 1.2–2.2)
Albumin: 4 g/dL (ref 3.8–4.8)
Alkaline Phosphatase: 100 IU/L (ref 44–121)
BUN/Creatinine Ratio: 22 (ref 12–28)
BUN: 19 mg/dL (ref 8–27)
Bilirubin Total: 0.2 mg/dL (ref 0.0–1.2)
CO2: 23 mmol/L (ref 20–29)
Calcium: 9.7 mg/dL (ref 8.7–10.3)
Chloride: 95 mmol/L — ABNORMAL LOW (ref 96–106)
Creatinine, Ser: 0.88 mg/dL (ref 0.57–1.00)
Globulin, Total: 2.4 g/dL (ref 1.5–4.5)
Glucose: 272 mg/dL — ABNORMAL HIGH (ref 70–99)
Potassium: 5.3 mmol/L — ABNORMAL HIGH (ref 3.5–5.2)
Sodium: 134 mmol/L (ref 134–144)
Total Protein: 6.4 g/dL (ref 6.0–8.5)
eGFR: 73 mL/min/{1.73_m2} (ref >59)

## 2021-06-12 NOTE — Progress Notes (Signed)
Glucose 272 very high, potassium still 5.3, we will watch- we may need to change BP medicines lp

## 2021-06-16 DIAGNOSIS — G4733 Obstructive sleep apnea (adult) (pediatric): Secondary | ICD-10-CM | POA: Diagnosis not present

## 2021-06-16 DIAGNOSIS — J4520 Mild intermittent asthma, uncomplicated: Secondary | ICD-10-CM | POA: Diagnosis not present

## 2021-06-16 DIAGNOSIS — R5383 Other fatigue: Secondary | ICD-10-CM | POA: Diagnosis not present

## 2021-06-17 DIAGNOSIS — J45909 Unspecified asthma, uncomplicated: Secondary | ICD-10-CM

## 2021-06-17 DIAGNOSIS — E785 Hyperlipidemia, unspecified: Secondary | ICD-10-CM | POA: Diagnosis not present

## 2021-06-17 DIAGNOSIS — E1159 Type 2 diabetes mellitus with other circulatory complications: Secondary | ICD-10-CM

## 2021-06-17 DIAGNOSIS — Z794 Long term (current) use of insulin: Secondary | ICD-10-CM | POA: Diagnosis not present

## 2021-06-17 DIAGNOSIS — I10 Essential (primary) hypertension: Secondary | ICD-10-CM

## 2021-06-18 ENCOUNTER — Telehealth: Payer: Self-pay

## 2021-06-18 NOTE — Telephone Encounter (Signed)
Msg from [place], "Hello,  Insurance sent a fax to let us know that she will get a temporary supply of Vimpat. Patient will need to see her neurologist. I tried to reach patient to explain her. If you reach her, can you please let her know and ask her to call me back? "  Called patient, she had to cancel Neuro appt yesterday due to sickness. Neuro recommended she come next month. Told patient she needs Neuro to write script, she will call tomorrow to ask Neuro to send 30 day supply until f/u  Patient wondered why Kcl not in [facility] packs, explained her Kcl was high and it's not on her meds list.

## 2021-06-18 NOTE — Progress Notes (Signed)
Pt had to r/s appt to 2/21 @ 11:45  Elray Mcgregor, [place] Pharmacist Assistant  [phone]

## 2021-06-20 ENCOUNTER — Telehealth: Payer: Commercial Managed Care - HMO

## 2021-06-24 DIAGNOSIS — J019 Acute sinusitis, unspecified: Secondary | ICD-10-CM | POA: Diagnosis not present

## 2021-06-25 ENCOUNTER — Ambulatory Visit: Payer: Medicaid Other | Admitting: Legal Medicine

## 2021-06-26 ENCOUNTER — Telehealth: Payer: Self-pay

## 2021-06-26 NOTE — Chronic Care Management (AMB) (Signed)
Chronic Care Management Pharmacy Assistant   Name: Lianna Sitzmann  MRN: [id number] DOB: 12-May-1956   Reason for Encounter: Medication Coordination for [facility]    Recent office visits:  06/05/21 Reinaldo Meeker MD, Seen for DM, HLD and HTN. Referral to Pulmonology. No med changes.   Recent consult visits:  None  Hospital visits:  None  Medications: Outpatient Encounter Medications as of 06/26/2021  Medication Sig   Accu-Chek Softclix Lancets lancets TEST BLOOD SUGAR THREE TIMES DAILY   atorvastatin (LIPITOR) 40 MG tablet Take 1 tablet (40 mg total) by mouth daily.   B-D UF III MINI PEN NEEDLES 31G X 5 MM MISC USE 1 PEN NEEDLE DAILY   Blood Glucose Calibration (ACCU-CHEK AVIVA) SOLN    Blood Glucose Monitoring Suppl (ACCU-CHEK AVIVA PLUS) w/Device KIT 1 each by Does not apply route 3 (three) times daily.   clotrimazole-betamethasone (LOTRISONE) cream Apply topically 2 (two) times daily.   cyclobenzaprine (FLEXERIL) 10 MG tablet Take 1 tablet (10 mg total) by mouth 3 (three) times daily as needed for muscle spasms.   glucose blood (ACCU-CHEK GUIDE) test strip Check sugars in am and before supper.   Insulin Pen Needle (NOVOFINE PEN NEEDLE) 32G X 6 MM MISC 1 each by Does not apply route every 7 (seven) days.   lacosamide (VIMPAT) 200 MG TABS tablet Take 1.5 tablets (300 mg total) by mouth 2 (two) times daily.   levETIRAcetam (KEPPRA) 1000 MG tablet Take 1 tablet (1,000 mg total) by mouth in the morning, at noon, and at bedtime.   losartan-hydrochlorothiazide (HYZAAR) 100-25 MG tablet Take 1 tablet by mouth daily.   montelukast (SINGULAIR) 10 MG tablet Take 1 tablet (10 mg total) by mouth daily. as directed   pantoprazole (PROTONIX) 40 MG tablet Take 1 tablet (40 mg total) by mouth 2 (two) times daily.   Semaglutide, 1 MG/DOSE, (OZEMPIC, 1 MG/DOSE,) 4 MG/3ML SOPN Inject 1 mg into the skin once a week.   TRESIBA FLEXTOUCH 100 UNIT/ML FlexTouch Pen Inject 26 Units into the skin daily.    No facility-administered encounter medications on file as of 06/26/2021.   Reviewed chart for medication changes ahead of medication coordination call.  No Consults, or hospital visits since last care coordination call/Pharmacist visit.   No medication changes indicated OR if recent visit, treatment plan here.  BP Readings from Last 3 Encounters:  06/05/21 138/82  05/14/21 136/80  04/23/21 140/88    Lab Results  Component Value Date   HGBA1C 8.9 (H) 06/05/2021     Patient obtains medications through [facility] Packaging  30 Days   Patient is due for her first adherence delivery on: 07/08/21. Called patient and reviewed medications and coordinated delivery.  This delivery to include: Montelukast 17m 1 in Evening Meal Levetiracetam 10043m1 in Breakfast, 1 in Lunch, 1 in Evening Meal Novofine Pen Needles 32g6m37macosamide 200m64m5 in Breakfast 1.5 in Evening Meal Atorvastatin 40mg63mn Evening Meal Losartan-HCTZ 100-25mg 34m Breakfast Ozempic 1mg- I52mct 1mg onc72m week. (Pharmacy Note: On Backorder and they are allocating pharmacy with so many per week. If they have it come delivery then they will send it)   Pharmacy [place] on 07/21/21  Patient declined the following medications  Pantoprazole Sodium 40mg  wa44mlled on 05/20/21 [facility]ills  None  Confirmed delivery date of 07/08/21, advised patient that pharmacy will contact them the morning of delivery.  Danielle Elray Mcgregor  [place] Pharmacist Assistant  [phone]

## 2021-06-26 NOTE — Telephone Encounter (Signed)
Compliant on meds 

## 2021-07-02 DIAGNOSIS — Z8711 Personal history of peptic ulcer disease: Secondary | ICD-10-CM | POA: Diagnosis not present

## 2021-07-02 DIAGNOSIS — K317 Polyp of stomach and duodenum: Secondary | ICD-10-CM | POA: Diagnosis not present

## 2021-07-02 DIAGNOSIS — K259 Gastric ulcer, unspecified as acute or chronic, without hemorrhage or perforation: Secondary | ICD-10-CM | POA: Diagnosis not present

## 2021-07-08 ENCOUNTER — Telehealth: Payer: Commercial Managed Care - HMO

## 2021-07-09 ENCOUNTER — Ambulatory Visit (INDEPENDENT_AMBULATORY_CARE_PROVIDER_SITE_OTHER): Payer: Medicare Other

## 2021-07-09 ENCOUNTER — Other Ambulatory Visit: Payer: Self-pay

## 2021-07-09 DIAGNOSIS — E782 Mixed hyperlipidemia: Secondary | ICD-10-CM

## 2021-07-09 DIAGNOSIS — I10 Essential (primary) hypertension: Secondary | ICD-10-CM

## 2021-07-09 DIAGNOSIS — K2100 Gastro-esophageal reflux disease with esophagitis, without bleeding: Secondary | ICD-10-CM

## 2021-07-09 NOTE — Progress Notes (Signed)
Chronic Care Management Pharmacy Note  07/09/2021 Name:  Alicia Kelly MRN:  [id number] DOB:  [birth year]   Plan Recommendations:  Sugars in 170's and A1c over 8, recommend provider prescribe Metformin and taper up the dose. CPP Scheduled F/U for March 2023 to check to see how sugars respond to additional therapy.  Subjective: Alicia Kelly is an 66 y.o. year old female who is a primary patient of Henrene Pastor, Zeb Comfort, MD.  The CCM team was consulted for assistance with disease management and care coordination needs.    Engaged with patient by telephone for follow up visit in response to provider referral for pharmacy case management and/or care coordination services.   Consent to Services:  The patient was given the following information about Chronic Care Management services today, agreed to services, and gave verbal consent: 1. CCM service includes personalized support from designated clinical staff supervised by the primary care provider, including individualized plan of care and coordination with other care providers 2. 24/7 contact phone numbers for assistance for urgent and routine care needs. 3. Service will only be billed when office clinical staff spend 20 minutes or more in a month to coordinate care. 4. Only one practitioner may furnish and bill the service in a calendar month. 5.The patient may stop CCM services at any time (effective at the end of the month) by phone call to the office staff. 6. The patient will be responsible for cost sharing (co-pay) of up to 20% of the service fee (after annual deductible is met). Patient agreed to services and consent obtained.  Patient Care Team: Lillard Anes, MD as PCP - General (Family Medicine) Lane Hacker, [facility] as Pharmacist (Pharmacist)  Recent office visits:  01/22/21 Orders Only. Started Ozempic 77m/1.5ml inject 0.5 into skin once weekly.    01/15/21 PReinaldo MeekerMD. Seen of obesity/diabetes. No med changes.    01/01/21 PReinaldo MeekerMD. Seen for cough. No med changes.    12/16/20 Orders Only. D/C Mycolog 1 application 2 times daily due to change in therapy.   11/04/20 PReinaldo MeekerMD. Seen for hospitalization follow up. D/C Omeprazole 40 mg daily due to change in therapy.   Recent consult visits:  10/28/20 (Neurology) FMaurie BoettcherMD. Seen for Generalized Epilepsy. Ordered Vimpat 200 mg take 1.5  2 times daily. Ordered Keppra 1000 mg 1 tablet three times daily.   10/25/20 (Oncology) LLavera GuiseMD. Seen for Leukocytosis. No med changes.   Hospital visits:  615/22-10/1720 ED visit. No notes available   Objective:  Lab Results  Component Value Date   CREATININE 0.88 06/11/2021   BUN 19 06/11/2021   GFRNONAA 82 07/03/2020   GFRAA 95 07/03/2020   NA 134 06/11/2021   K 5.3 (H) 06/11/2021   CALCIUM 9.7 06/11/2021   CO2 23 06/11/2021   GLUCOSE 272 (H) 06/11/2021    Lab Results  Component Value Date/Time   HGBA1C 8.9 (H) 06/05/2021 09:17 AM   HGBA1C 9.0 (H) 04/23/2021 11:35 AM   MICROALBUR 10 07/03/2020 10:25 AM    Last diabetic Eye exam: No results found for: HMDIABEYEEXA  Last diabetic Foot exam: No results found for: HMDIABFOOTEX   Lab Results  Component Value Date   CHOL 135 06/05/2021   HDL 49 06/05/2021   LDLCALC 67 06/05/2021   TRIG 106 06/05/2021   CHOLHDL 2.8 06/05/2021    Hepatic Function Latest Ref Rng & Units 06/11/2021 06/05/2021 01/15/2021  Total Protein 6.0 - 8.5 g/dL 6.4 6.5 6.5  Albumin 3.8 - 4.8 g/dL 4.0 4.2 4.2  AST 0 - 40 IU/L '13 18 18  ' ALT 0 - 32 IU/L '13 17 21  ' Alk Phosphatase 44 - 121 IU/L 100 110 83  Total Bilirubin 0.0 - 1.2 mg/dL 0.2 0.2 0.3    Lab Results  Component Value Date/Time   TSH 1.170 06/05/2021 09:18 AM    CBC Latest Ref Rng & Units 06/05/2021 01/15/2021 10/25/2020  WBC 3.4 - 10.8 x10E3/uL 13.3(H) 10.3 9.5  Hemoglobin 11.1 - 15.9 g/dL 9.1(L) 11.2 11.7(A)  Hematocrit 34.0 - 46.6 % 30.0(L) 35.2 35(A)  Platelets 150 - 450  x10E3/uL 720(H) 438 389    No results found for: VD25OH  Clinical ASCVD: No  The 10-year ASCVD risk score (Arnett DK, et al., 2019) is: 12.3%   Values used to calculate the score:     Age: 71 years     Sex: Female     Is Non-Hispanic African American: No     Diabetic: Yes     Tobacco smoker: No     Systolic Blood Pressure: 131 mmHg     Is BP treated: Yes     HDL Cholesterol: 49 mg/dL     Total Cholesterol: 135 mg/dL    Depression screen PHQ 2/9 07/03/2020  Decreased Interest 0  Down, Depressed, Hopeless 0  PHQ - 2 Score 0      Social History   Tobacco Use  Smoking Status Never  Smokeless Tobacco Never   BP Readings from Last 3 Encounters:  06/05/21 138/82  05/14/21 136/80  04/23/21 140/88   Pulse Readings from Last 3 Encounters:  06/05/21 92  05/14/21 84  04/23/21 83   Wt Readings from Last 3 Encounters:  06/05/21 248 lb 12.8 oz (112.9 kg)  05/14/21 249 lb (112.9 kg)  04/23/21 253 lb (114.8 kg)   BMI Readings from Last 3 Encounters:  06/05/21 42.71 kg/m  05/14/21 42.74 kg/m  04/23/21 40.84 kg/m    Assessment/Interventions: Review of patient past medical history, allergies, medications, health status, including review of consultants reports, laboratory and other test data, was performed as part of comprehensive evaluation and provision of chronic care management services.   SDOH:  (Social Determinants of Health) assessments and interventions performed: Yes   SDOH Screenings   Alcohol Screen: Low Risk    Last Alcohol Screening Score (AUDIT): 0  Depression (PHQ2-9): Not on file  Financial Resource Strain: Low Risk    Difficulty of Paying Living Expenses: Not hard at all  Food Insecurity: No Food Insecurity   Worried About Charity fundraiser in the Last Year: Never true   Ran Out of Food in the Last Year: Never true  Housing: Low Risk    Last Housing Risk Score: 0  Physical Activity: Insufficiently Active   Days of Exercise per Week: 2 days    Minutes of Exercise per Session: 20 min  Social Connections: Moderately Isolated   Frequency of Communication with Friends and Family: More than three times a week   Frequency of Social Gatherings with Friends and Family: Never   Attends Religious Services: Never   Marine scientist or Organizations: No   Attends Music therapist: Never   Marital Status: Married  Stress: No Stress Concern Present   Feeling of Stress : Not at all  Tobacco Use: Low Risk    Smoking Tobacco Use: Never   Smokeless Tobacco Use: Never   Passive Exposure: Not on file  Transportation Needs:  No Transportation Needs   Lack of Transportation (Medical): No   Lack of Transportation (Non-Medical): No    CCM Care Plan  Allergies  Allergen Reactions   Levofloxacin Other (See Comments)    Throat swelling   Lisinopril Hives   Nitrofurantoin Swelling    Throat swelling   Penicillins Anaphylaxis   Clarithromycin Other (See Comments)    seizures   Sulfamethoxazole-Trimethoprim     Causes seizures    Medications Reviewed Today     Reviewed by Lane Hacker, [facility] (Pharmacist) on 07/09/21 at [place] List Status: <None>   Medication Order Taking? Sig Documenting Provider Last Dose Status Informant  Accu-Chek Softclix Lancets lancets [id number] No TEST BLOOD SUGAR THREE TIMES DAILY Lillard Anes, MD Taking Active   atorvastatin (LIPITOR) 40 MG tablet [id number] No Take 1 tablet (40 mg total) by mouth daily. Lillard Anes, MD Taking Active   B-D UF III MINI PEN NEEDLES 31G X 5 MM MISC [id number] No USE 1 PEN NEEDLE DAILY Lillard Anes, MD Taking Active   Blood Glucose Calibration (ACCU-CHEK AVIVA) Bailey Mech [id number] No  [provider] Taking Active   Blood Glucose Monitoring Suppl (ACCU-CHEK AVIVA PLUS) w/Device KIT [id number] No 1 each by Does not apply route 3 (three) times daily. Lillard Anes, MD Taking Active   clotrimazole-betamethasone Donalynn Furlong)  cream [id number] No Apply topically 2 (two) times daily. Lillard Anes, MD Taking Active   cyclobenzaprine (FLEXERIL) 10 MG tablet [id number] No Take 1 tablet (10 mg total) by mouth 3 (three) times daily as needed for muscle spasms. Lillard Anes, MD Taking Active   glucose blood (ACCU-CHEK GUIDE) test strip [id number]  Check sugars in am and before supper. Lillard Anes, MD  Active   Insulin Pen Needle (NOVOFINE PEN NEEDLE) 32G X 6 MM MISC [id number]  1 each by Does not apply route every 7 (seven) days. Lillard Anes, MD  Active   lacosamide (VIMPAT) 200 MG TABS tablet [id number] No Take 1.5 tablets (300 mg total) by mouth 2 (two) times daily. Lillard Anes, MD Taking Active   levETIRAcetam (KEPPRA) 1000 MG tablet [id number] No Take 1 tablet (1,000 mg total) by mouth in the morning, at noon, and at bedtime. Lillard Anes, MD Taking Active   losartan-hydrochlorothiazide [facility]) 100-25 MG tablet [id number] No Take 1 tablet by mouth daily. Lillard Anes, MD Taking Active   montelukast (SINGULAIR) 10 MG tablet [id number] No Take 1 tablet (10 mg total) by mouth daily. as directed Lillard Anes, MD Taking Active   pantoprazole (PROTONIX) 40 MG tablet [id number] No Take 1 tablet (40 mg total) by mouth 2 (two) times daily. Lillard Anes, MD Taking Active   Semaglutide, 1 MG/DOSE, (OZEMPIC, 1 MG/DOSE,) 4 MG/3ML SOPN [id number]  Inject 1 mg into the skin once a week. Lillard Anes, MD  Active   TRESIBA FLEXTOUCH 100 UNIT/ML FlexTouch Pen [id number]  Inject 26 Units into the skin daily. Lillard Anes, MD  Active             Patient Active Problem List   Diagnosis Date Noted   Paralysis ([place]) 04/23/2021   BMI 40.0-44.9, adult ([place]) 01/15/2021   Morbid obesity ([place]) 01/15/2021   Left-sided weakness 09/11/2020   Recurrent falls 09/11/2020   Back pain 09/05/2020   Obesity, diabetes, and hypertension syndrome ([place])  07/03/2020   Complex partial seizures ([place]) 04/09/2020   Essential hypertension 08/01/2018   Hyperlipidemia 08/01/2018  Coronary artery calcification seen on CT scan 08/01/2018   Postmenopause 04/30/2017   Lumbosacral radiculopathy at L4 01/07/2017   GERD (gastroesophageal reflux disease) 11/29/2016   Sleep apnea 11/29/2016   Sinus bradycardia 02/13/2016   Generalized epilepsy ([place]) 01/05/2013    Immunization History  Administered Date(s) Administered   Influenza Inj Mdck Quad Pf 06/05/2021   Moderna Sars-Covid-2 Vaccination 09/15/2019, 10/13/2019, 05/15/2020, 11/08/2020   Pneumococcal Polysaccharide-23 10/02/2020   Tdap 04/04/2014    Conditions to be addressed/monitored:  Hypertension, Hyperlipidemia, Diabetes, Coronary Artery Disease, GERD and Epilepsy.   Care Plan : Pharmacy Care Plan  Updates made by Lane Hacker, RPH since 07/09/2021 12:00 AM     Problem: htn, hld, dm, gerd   Priority: High  Onset Date: 08/22/2020     Long-Range Goal: Disease Management   Start Date: 08/22/2020  Expected End Date: 08/22/2021  Recent Progress: On track  Priority: High  Note:    Current Barriers:  Unable to achieve control of blood sugar   Pharmacist Clinical Goal(s):  Patient will achieve control of diabetes as evidenced by a1c through collaboration with PharmD and provider.   Interventions: 1:1 collaboration with Lillard Anes, MD regarding development and update of comprehensive plan of care as evidenced by provider attestation and co-signature Inter-disciplinary care team collaboration (see longitudinal plan of care) Comprehensive medication review performed; medication list updated in electronic medical record  Hypertension (BP goal <130/80) BP Readings from Last 3 Encounters:  04/23/21 140/88  01/15/21 118/80  01/01/21 (!) 150/90  -Not Ideally Controlled -Current treatment: losartan-hydrochlorothizide 100-25 mg daily Appropriate, Query effective, Safe,  Accessible -Medications previously tried: lisinopril, amlodipine, irbesartan-hctz -Current home readings: goes up with hot flash per patient. Checks it at home and is "good". ~120s/86 -Current dietary habits: states little appetite -Current exercise habits: walks and shops but no formal exercise -Denies hypotensive/hypertensive symptoms -Educated on BP goals and benefits of medications for prevention of heart attack, stroke and kidney damage; Daily salt intake goal < 2300 mg; Exercise goal of 150 minutes per week; Importance of home blood pressure monitoring; -Counseled to monitor BP at home weekly, document, and provide log at future appointments -Counseled on diet and exercise extensively December 2022: BP typically controlled but patient was still upset from possible MS Dx and ER trip yesterday. Will re-assess in 3 weeks at PCP f/u Jan 2023: Patient very non-compliant on meds, (See note for fill dates). Main priority for today's visit is to onboard to [facility] so I can ensure compliance, will evaluate after that in Feb  Hyperlipidemia: (LDL goal < 70) The 10-year ASCVD risk score (Arnett DK, et al., 2019) is: 13.2%   Values used to calculate the score:     Age: 49 years     Sex: Female     Is Non-Hispanic African American: No     Diabetic: Yes     Tobacco smoker: No     Systolic Blood Pressure: 381 mmHg     Is BP treated: Yes     HDL Cholesterol: 52 mg/dL     Total Cholesterol: 153 mg/dL Lab Results  Component Value Date   CHOL 153 01/15/2021   CHOL 156 10/02/2020   CHOL 134 07/03/2020   Lab Results  Component Value Date   HDL 52 01/15/2021   HDL 61 10/02/2020   HDL 56 07/03/2020   Lab Results  Component Value Date   LDLCALC 76 01/15/2021   [place] 75 10/02/2020   [place] 57 07/03/2020   Lab  Results  Component Value Date   TRIG 143 01/15/2021   TRIG 110 10/02/2020   TRIG 118 07/03/2020   Lab Results  Component Value Date   CHOLHDL 2.9 01/15/2021   CHOLHDL 2.6  10/02/2020   CHOLHDL 2.4 07/03/2020  No results found for: LDLDIRECT -Controlled -Current treatment: atorvastatin 40 mg daily Appropriate, Query effective, Safe, Accessible -Medications previously tried: none reported  -Current dietary patterns: reports little appetite -Current exercise habits: no formal exercise but encouraged patient to begin walking her loop with her neighbor -Educated on Cholesterol goals;  Benefits of statin for ASCVD risk reduction; Importance of limiting foods high in cholesterol; Exercise goal of 150 minutes per week; -Counseled on diet and exercise extensively Jan 2023: Patient very non-compliant on meds, (See note for fill dates). Main priority for today's visit is to onboard to [facility] so I can ensure compliance, will evaluate after that in Feb  Diabetes (A1c goal <7%) Lab Results  Component Value Date   HGBA1C 8.9 (H) 01/15/2021   HGBA1C 7.9 (H) 10/02/2020   HGBA1C 8.2 (H) 07/03/2020   Lab Results  Component Value Date   MICROALBUR 10 07/03/2020   LDLCALC 76 01/15/2021   CREATININE 0.81 01/15/2021   Lab Results  Component Value Date   NA 136 01/15/2021   K 4.7 01/15/2021   CREATININE 0.81 01/15/2021   EGFR 81 01/15/2021   GFRNONAA 82 07/03/2020   GLUCOSE 257 (H) 01/15/2021   Lab Results  Component Value Date   WBC 10.3 01/15/2021   HGB 11.2 01/15/2021   HCT 35.2 01/15/2021   MCV 88 01/15/2021   PLT 438 01/15/2021  -Uncontrolled -Current medications: accu-chek softclix Appropriate, Effective, Safe, Accessible accu-chek aviva Appropriate, Effective, Safe, Accessible Pen needles daily Appropriate, Effective, Safe, Accessible tresiba flextouch 28 units daily Appropriate, Query effective, Safe, Accessible Ozempic 78m/week Appropriate, Query effective, Safe, Accessible -Medications previously tried: metformin, Glipizide (Dc'd December 2022) -Current home glucose readings fasting glucose:  April 2022: 168, 202 Jan 2023: Didn't have  readings on her Feb 2023: Sugars around 170, patient doesn't write them down post prandial glucose: not reported -Denies hypoglycemic/hyperglycemic symptoms -Current meal patterns: Doesn't eat bread or sandwiches. Less appetite lately.  breakfast: eggs, eats 1 banana a day lunch:  sandwich and banana dinner:  hamburger, cream potatoes and gravy snacks:  limited drinks:  water, tea,  -Current exercise:  walks, shops, enjoys dancing but hasn't been since COVID -Educated on A1c and blood sugar goals; Complications of diabetes including kidney damage, retinal damage, and cardiovascular disease; Exercise goal of 150 minutes per week; Benefits of routine self-monitoring of blood sugar; Carbohydrate counting and/or plate method -Counseled to check feet daily and get yearly eye exams -Counseled on diet and exercise extensively December 2022: Patient is very upset about recent trip to ER and possible MS Dx. Because of this, didn't talk too much about sugars. She did state she has not picked up Ozempic because Pharmacy is unable to get/fill it. After speaking with patient, she expressed frustrations about having to take all the meds, organize them, keep track of refills, and have to make phone calls just to take a medicine she doesn't want. Explained [facility] process and she stated she'd like to start using in Jan once her new insurance starts. She wants to wait on the Ozempic until then Verbal consent obtained for [facility] enhanced pharmacy services (medication synchronization, adherence packaging, delivery coordination). A medication sync plan was created to allow patient to get all medications delivered once  every 30 to 90 days per patient preference. Patient understands they have freedom to choose pharmacy and clinical pharmacist will coordinate care between all prescribers and [facility]. Jan 2023: Patient very non-compliant on meds, (See note for fill dates). Main priority for today's  visit is to onboard to [facility] so I can ensure compliance, will evaluate after that in Feb Feb 2023: Patient taking meds on schedule. Sugars in 170's per patient, she doesn't write them down. Will start writing down. Recommend adding Metformin (Patient states it never gave her ADR's but that it didn't bring her sugars down).   Epilepsy (Goal: seizure control) -Controlled -Current treatment  Vimpat 200 mg - 1.5 tablets twice daily Appropriate, Effective, Safe, Accessible Keppra 1000 mg tid Appropriate, Effective, Safe, Accessible -Medications previously tried: dilantin  -Recommended to continue current medication  GERD (Goal: manage symptoms and prevent recurrence of stomach bleed) -Controlled -Current treatment  Pantoprazole 40 mg bid (Active bleeding ulcers) Appropriate, Effective, Safe, Accessible -Medications previously tried:  omeprazole, Zantac -Counseled on diet and exercise extensively Recommended to continue current medication Recommended patient follow-up with GI if symptoms reoccur.  -Patient will be on Pantoprazole indefinitely due to Hx of ulcers  Asthma (Goal: manage symptoms and avoid falres) -Controlled -Current treatment  montelukast 10 mg daily as directed Appropriate, Effective, Safe, Accessible -Medications previously tried: none reported  -Recommended to continue current medication   Patient Goals/Self-Care Activities Patient will:  - take medications as prescribed focus on medication adherence by continuing to use daily pill box check glucose twice daily, document, and provide at future appointments target a minimum of 150 minutes of moderate intensity exercise weekly engage in dietary modifications by limiting carbohydrates/sugars and balanced meals  Follow Up Plan: Telephone follow up appointment with care management team member scheduled for:  March 2023 2023  Arizona Constable, Pharm.D. - [phone]       Medication Assistance: None required.   Patient affirms current coverage meets needs.  Adherence Review: Does the patient have >5 day gap between last estimated fill dates? No   Star Rating Drugs: Pt gets through mail order and stated has enough supply Medication Name      Last Fill          Days supply Atorvastatin                 09/23/20              90ds     Care Gaps: Last annual wellness visit? 07/26/15  Patient's preferred pharmacy is:  [facility], [address] [place] [address] Phone: [phone] Fax: [phone]  [facility], [address] [place] [address] Phone: [phone] Fax: [phone]  [facility] - [place], [place] - [address] [place] [address] Phone: [phone] Fax: [phone]   Uses pill box? No - uses small daily pill container but not weekly planner.  Pt endorses good compliance  We discussed: Benefits of medication synchronization, packaging and delivery as well as enhanced pharmacist oversight with [facility]. Patient decided to: Utilize [facility] for medication synchronization, packaging and delivery starting Jan 2023 Verbal consent obtained for [facility] enhanced pharmacy services (medication synchronization, adherence packaging, delivery coordination). A medication sync plan was created to allow patient to get all medications delivered once every 30 to 90 days per patient preference. Patient understands  they have freedom to choose pharmacy and clinical pharmacist will coordinate care between all prescribers and [facility].   Care Plan and Follow Up Patient Decision:  Patient agrees to Care Plan and Follow-up.  Plan: Telephone follow up appointment with care management team member scheduled for:  March 2023  Arizona Constable, [place].D. - [phone]

## 2021-07-09 NOTE — Patient Instructions (Signed)
Visit Information   Goals Addressed   None    Patient Care Plan: Pharmacy Care Plan     Problem Identified: htn, hld, dm, gerd   Priority: High  Onset Date: 08/22/2020     Long-Range Goal: Disease Management   Start Date: 08/22/2020  Expected End Date: 08/22/2021  Recent Progress: On track  Priority: High  Note:    Current Barriers:  Unable to achieve control of blood sugar   Pharmacist Clinical Goal(s):  Patient will achieve control of diabetes as evidenced by a1c through collaboration with PharmD and provider.   Interventions: 1:1 collaboration with Lillard Anes, MD regarding development and update of comprehensive plan of care as evidenced by provider attestation and co-signature Inter-disciplinary care team collaboration (see longitudinal plan of care) Comprehensive medication review performed; medication list updated in electronic medical record  Hypertension (BP goal <130/80) BP Readings from Last 3 Encounters:  04/23/21 140/88  01/15/21 118/80  01/01/21 (!) 150/90  -Not Ideally Controlled -Current treatment: losartan-hydrochlorothizide 100-25 mg daily Appropriate, Query effective, Safe, Accessible -Medications previously tried: lisinopril, amlodipine, irbesartan-hctz -Current home readings: goes up with hot flash per patient. Checks it at home and is "good". ~120s/86 -Current dietary habits: states little appetite -Current exercise habits: walks and shops but no formal exercise -Denies hypotensive/hypertensive symptoms -Educated on BP goals and benefits of medications for prevention of heart attack, stroke and kidney damage; Daily salt intake goal < 2300 mg; Exercise goal of 150 minutes per week; Importance of home blood pressure monitoring; -Counseled to monitor BP at home weekly, document, and provide log at future appointments -Counseled on diet and exercise extensively December 2022: BP typically controlled but patient was still upset from possible MS  Dx and ER trip yesterday. Will re-assess in 3 weeks at PCP f/u Jan 2023: Patient very non-compliant on meds, (See note for fill dates). Main priority for today's visit is to onboard to [facility] so I can ensure compliance, will evaluate after that in Feb  Hyperlipidemia: (LDL goal < 70) The 10-year ASCVD risk score (Arnett DK, et al., 2019) is: 13.2%   Values used to calculate the score:     Age: 66 years     Sex: Female     Is Non-Hispanic African American: No     Diabetic: Yes     Tobacco smoker: No     Systolic Blood Pressure: 116 mmHg     Is BP treated: Yes     HDL Cholesterol: 52 mg/dL     Total Cholesterol: 153 mg/dL Lab Results  Component Value Date   CHOL 153 01/15/2021   CHOL 156 10/02/2020   CHOL 134 07/03/2020   Lab Results  Component Value Date   HDL 52 01/15/2021   HDL 61 10/02/2020   HDL 56 07/03/2020   Lab Results  Component Value Date   LDLCALC 76 01/15/2021   LDLCALC 75 10/02/2020   LDLCALC 57 07/03/2020   Lab Results  Component Value Date   TRIG 143 01/15/2021   TRIG 110 10/02/2020   TRIG 118 07/03/2020   Lab Results  Component Value Date   CHOLHDL 2.9 01/15/2021   CHOLHDL 2.6 10/02/2020   CHOLHDL 2.4 07/03/2020  No results found for: LDLDIRECT -Controlled -Current treatment: atorvastatin 40 mg daily Appropriate, Query effective, Safe, Accessible -Medications previously tried: none reported  -Current dietary patterns: reports little appetite -Current exercise habits: no formal exercise but encouraged patient to begin walking her loop with her neighbor -Educated on Cholesterol goals;  Benefits of statin for ASCVD risk reduction; Importance of limiting foods high in cholesterol; Exercise goal of 150 minutes per week; -Counseled on diet and exercise extensively Jan 2023: Patient very non-compliant on meds, (See note for fill dates). Main priority for today's visit is to onboard to [facility] so I can ensure compliance, will evaluate after that in  Feb  Diabetes (A1c goal <7%) Lab Results  Component Value Date   HGBA1C 8.9 (H) 01/15/2021   HGBA1C 7.9 (H) 10/02/2020   HGBA1C 8.2 (H) 07/03/2020   Lab Results  Component Value Date   MICROALBUR 10 07/03/2020   LDLCALC 76 01/15/2021   CREATININE 0.81 01/15/2021   Lab Results  Component Value Date   NA 136 01/15/2021   K 4.7 01/15/2021   CREATININE 0.81 01/15/2021   EGFR 81 01/15/2021   GFRNONAA 82 07/03/2020   GLUCOSE 257 (H) 01/15/2021   Lab Results  Component Value Date   WBC 10.3 01/15/2021   HGB 11.2 01/15/2021   HCT 35.2 01/15/2021   MCV 88 01/15/2021   PLT 438 01/15/2021  -Uncontrolled -Current medications: accu-chek softclix Appropriate, Effective, Safe, Accessible accu-chek aviva Appropriate, Effective, Safe, Accessible Pen needles daily Appropriate, Effective, Safe, Accessible tresiba flextouch 28 units daily Appropriate, Query effective, Safe, Accessible Ozempic 11m/week Appropriate, Query effective, Safe, Accessible -Medications previously tried: metformin, Glipizide (Dc'd December 2022) -Current home glucose readings fasting glucose:  April 2022: 168, 202 Jan 2023: Didn't have readings on her Feb 2023: Sugars around 170, patient doesn't write them down post prandial glucose: not reported -Denies hypoglycemic/hyperglycemic symptoms -Current meal patterns: Doesn't eat bread or sandwiches. Less appetite lately.  breakfast: eggs, eats 1 banana a day lunch:  sandwich and banana dinner:  hamburger, cream potatoes and gravy snacks:  limited drinks:  water, tea,  -Current exercise:  walks, shops, enjoys dancing but hasn't been since COVID -Educated on A1c and blood sugar goals; Complications of diabetes including kidney damage, retinal damage, and cardiovascular disease; Exercise goal of 150 minutes per week; Benefits of routine self-monitoring of blood sugar; Carbohydrate counting and/or plate method -Counseled to check feet daily and get yearly eye  exams -Counseled on diet and exercise extensively December 2022: Patient is very upset about recent trip to ER and possible MS Dx. Because of this, didn't talk too much about sugars. She did state she has not picked up Ozempic because Pharmacy is unable to get/fill it. After speaking with patient, she expressed frustrations about having to take all the meds, organize them, keep track of refills, and have to make phone calls just to take a medicine she doesn't want. Explained [facility] process and she stated she'd like to start using in Jan once her new insurance starts. She wants to wait on the Ozempic until then Verbal consent obtained for [facility] enhanced pharmacy services (medication synchronization, adherence packaging, delivery coordination). A medication sync plan was created to allow patient to get all medications delivered once every 30 to 90 days per patient preference. Patient understands they have freedom to choose pharmacy and clinical pharmacist will coordinate care between all prescribers and [facility]. Jan 2023: Patient very non-compliant on meds, (See note for fill dates). Main priority for today's visit is to onboard to [facility] so I can ensure compliance, will evaluate after that in Feb Feb 2023: Patient taking meds on schedule. Sugars in 170's per patient, she doesn't write them down. Will start writing down. Recommend adding Metformin (Patient states it never gave her ADR's but that it  didn't bring her sugars down).   Epilepsy (Goal: seizure control) -Controlled -Current treatment  Vimpat 200 mg - 1.5 tablets twice daily Appropriate, Effective, Safe, Accessible Keppra 1000 mg tid Appropriate, Effective, Safe, Accessible -Medications previously tried: dilantin  -Recommended to continue current medication  GERD (Goal: manage symptoms and prevent recurrence of stomach bleed) -Controlled -Current treatment  Pantoprazole 40 mg bid (Active bleeding ulcers)  Appropriate, Effective, Safe, Accessible -Medications previously tried:  omeprazole, Zantac -Counseled on diet and exercise extensively Recommended to continue current medication Recommended patient follow-up with GI if symptoms reoccur.  -Patient will be on Pantoprazole indefinitely due to Hx of ulcers  Asthma (Goal: manage symptoms and avoid falres) -Controlled -Current treatment  montelukast 10 mg daily as directed Appropriate, Effective, Safe, Accessible -Medications previously tried: none reported  -Recommended to continue current medication   Patient Goals/Self-Care Activities Patient will:  - take medications as prescribed focus on medication adherence by continuing to use daily pill box check glucose twice daily, document, and provide at future appointments target a minimum of 150 minutes of moderate intensity exercise weekly engage in dietary modifications by limiting carbohydrates/sugars and balanced meals  Follow Up Plan: Telephone follow up appointment with care management team member scheduled for:  March 2023 2023  Arizona Constable, Pharm.D. - [phone]      Ms. Liberati was given information about Chronic Care Management services today including:  CCM service includes personalized support from designated clinical staff supervised by her physician, including individualized plan of care and coordination with other care providers 24/7 contact phone numbers for assistance for urgent and routine care needs. Standard insurance, coinsurance, copays and deductibles apply for chronic care management only during months in which we provide at least 20 minutes of these services. Most insurances cover these services at 100%, however patients may be responsible for any copay, coinsurance and/or deductible if applicable. This service may help you avoid the need for more expensive face-to-face services. Only one practitioner may furnish and bill the service in a calendar month. The  patient may stop CCM services at any time (effective at the end of the month) by phone call to the office staff.  Patient agreed to services and verbal consent obtained.   The patient verbalized understanding of instructions, educational materials, and care plan provided today and declined offer to receive copy of patient instructions, educational materials, and care plan.  The pharmacy team will reach out to the patient again over the next 60 days.   Lane Hacker, [place]

## 2021-07-14 NOTE — Progress Notes (Signed)
In regards to Metformin recommendation, PCP wrote, "Patient did not tolerate metformin , so using ozempic and tresiba"  Patient's sugars are still elevated, Will ask PCP to call to increase the [place]

## 2021-07-15 DIAGNOSIS — E782 Mixed hyperlipidemia: Secondary | ICD-10-CM | POA: Diagnosis not present

## 2021-07-15 DIAGNOSIS — I10 Essential (primary) hypertension: Secondary | ICD-10-CM | POA: Diagnosis not present

## 2021-07-23 ENCOUNTER — Ambulatory Visit: Payer: Medicare HMO | Admitting: Neurology

## 2021-07-24 ENCOUNTER — Ambulatory Visit: Payer: Medicare HMO | Admitting: Neurology

## 2021-07-25 ENCOUNTER — Telehealth: Payer: Self-pay

## 2021-07-25 NOTE — Chronic Care Management (AMB) (Signed)
? ? ?Chronic Care Management ?Pharmacy Assistant  ? ?Name: Bonni Neuser  MRN: [id number] DOB: 09/18/1955 ? ? ?Reason for Encounter: Medication Coordination for [facility]  ?  ?Recent office visits:  ?None ? ?Recent consult visits:  ?None ? ?Hospital visits:  ?None ? ?Medications: ?Outpatient Encounter Medications as of 07/25/2021  ?Medication Sig  ? Accu-Chek Softclix Lancets lancets TEST BLOOD SUGAR THREE TIMES DAILY  ? atorvastatin (LIPITOR) 40 MG tablet Take 1 tablet (40 mg total) by mouth daily.  ? B-D UF III MINI PEN NEEDLES 31G X 5 MM MISC USE 1 PEN NEEDLE DAILY  ? Blood Glucose Calibration (ACCU-CHEK AVIVA) SOLN   ? Blood Glucose Monitoring Suppl (ACCU-CHEK AVIVA PLUS) w/Device KIT 1 each by Does not apply route 3 (three) times daily.  ? clotrimazole-betamethasone (LOTRISONE) cream Apply topically 2 (two) times daily.  ? cyclobenzaprine (FLEXERIL) 10 MG tablet Take 1 tablet (10 mg total) by mouth 3 (three) times daily as needed for muscle spasms.  ? glucose blood (ACCU-CHEK GUIDE) test strip Check sugars in am and before supper.  ? Insulin Pen Needle (NOVOFINE PEN NEEDLE) 32G X 6 MM MISC 1 each by Does not apply route every 7 (seven) days.  ? lacosamide (VIMPAT) 200 MG TABS tablet Take 1.5 tablets (300 mg total) by mouth 2 (two) times daily.  ? levETIRAcetam (KEPPRA) 1000 MG tablet Take 1 tablet (1,000 mg total) by mouth in the morning, at noon, and at bedtime.  ? losartan-hydrochlorothiazide (HYZAAR) 100-25 MG tablet Take 1 tablet by mouth daily.  ? montelukast (SINGULAIR) 10 MG tablet Take 1 tablet (10 mg total) by mouth daily. as directed  ? pantoprazole (PROTONIX) 40 MG tablet Take 1 tablet (40 mg total) by mouth 2 (two) times daily.  ? Semaglutide, 1 MG/DOSE, (OZEMPIC, 1 MG/DOSE,) 4 MG/3ML SOPN Inject 1 mg into the skin once a week.  ? TRESIBA FLEXTOUCH 100 UNIT/ML FlexTouch Pen Inject 26 Units into the skin daily.  ? ?No facility-administered encounter medications on file as of 07/25/2021.  ? ? ?Reviewed  chart for medication changes ahead of medication coordination call. ? ?No OVs, Consults, or hospital visits since last care coordination call/Pharmacist visit.  ? ?No medication changes indicated OR if recent visit, treatment plan here. ? ?BP Readings from Last 3 Encounters:  ?06/05/21 138/82  ?05/14/21 136/80  ?04/23/21 140/88  ?  ?Lab Results  ?Component Value Date  ? HGBA1C 8.9 (H) 06/05/2021  ?  ? ?Patient obtains medications through Adherence Packaging  30 Days  ? ?Last adherence delivery included: ?Montelukast 15m 1 in Evening Meal ?Levetiracetam 10014m1 in Breakfast, 1 in Lunch, 1 in Evening Meal ?Novofine Pen Needles 32g6m38mLacosamide 200m44m5 in Breakfast 1.5 in Evening Meal ?Atorvastatin 40mg85mn Evening Meal ?Losartan-HCTZ 100-25mg 66m Breakfast ?Ozempic 1mg- I85mct 1mg onc20m week. (Pharmacy Note: On Backorder and they are allocating pharmacy with so many per week. If they have it come delivery then they will send it) ? ?Patient declined (meds) last month  ?Pantoprazole Sodium 40mg  wa88mlled on 05/20/21 for 90ds from [facility]  ? ?Patient is due for next adherence delivery on: 08/06/21. ?Called patient and reviewed medications and coordinated delivery. ? ?This delivery to include: ?Pantoprazole 40mg 1 at39mnd 1 at EM ?Lacosamide 200mg 1.5 a44mand 1.5 at EM ?Ozempic 4mg Inject 75m into ski57mnce a week  ?Montelukast 10mg 1 in EM 29metiracetam 1000mg 1 at B 1 17m 1 at EM ?Atorvastatin 40mg12m  1 at EM ?Losartan-HCTZ 100-61m 1 at B ?Novofine Pen Needles 32gx663m? ?Patient declined the following medications  ?Insurance will not pay for test strips until 08/11/21.  ?TrTyler Aasot due until 09/18/21 ? ?Patient needs refills  ?None ? ?Confirmed delivery date of 08/06/21, advised patient that pharmacy will contact them the morning of delivery. ? ? ?DaElray McgregorCMA ?Clinical Pharmacist Assistant  ?[phone]?

## 2021-07-29 NOTE — Telephone Encounter (Signed)
Compliant on meds 

## 2021-08-01 ENCOUNTER — Telehealth: Payer: Self-pay

## 2021-08-01 NOTE — Chronic Care Management (AMB) (Signed)
? ? ?Chronic Care Management ?Pharmacy Assistant  ? ?Name: Alicia Kelly  MRN: [id number] DOB: 11-07-1955 ? ?Reason for Encounter: Prior Authorization Coordination ? ?08/01/2021- Received message from [place] stating Lacosamide requires a PA, completed prior authorization through [employer]. Awaiting determination. Patient medication was denied Request Reference #: [id number], denied due to quantity limitations.  ? ?08/05/2021- [place] Appeal department, spoke with representative, Reference # [id number] she referred me to another number for expedited review [phone], fax# [phone]- for provider name, telephone number and address, member ID number, reason for prescription and expedited review request reason. Called [phone] spoke with Mauri Brooklyn, he was unable to assist with appeal. I was transferred to the appeal department, spoke with Bartolo Darter she was unable to help and needed to send me to another department, after speaking with the next representative she had to transfer me to the [facility] department, after speaking with the representative in the [facility] department, I was informed that clinical notes and reason for quantity needed will need to be faxed to the number above. Sent urgent request to Clinical team to fax. ? ?Notified [facility], technician will try and process medication tomorrow to see if appeal went through. Called patient to inform status of medication and prior auth denial and appeal in progress. Patient aware, she has a Neurology appt coming  soon and will inform the doctor in case they need to call insurance company regarding this medication and it necessity for her to have. Patient aware we will keep her posted on Lacosamide, she has a delivery coming out tomorrow 3/22 but if medication is not approved [facility] will have to delivery the next day. Patient is not out of medication but we will keep her posted.  ? ? ?Medications: ?Outpatient Encounter  Medications as of 08/01/2021  ?Medication Sig  ? Accu-Chek Softclix Lancets lancets TEST BLOOD SUGAR THREE TIMES DAILY  ? atorvastatin (LIPITOR) 40 MG tablet Take 1 tablet (40 mg total) by mouth daily.  ? B-D UF III MINI PEN NEEDLES 31G X 5 MM MISC USE 1 PEN NEEDLE DAILY  ? Blood Glucose Calibration (ACCU-CHEK AVIVA) SOLN   ? Blood Glucose Monitoring Suppl (ACCU-CHEK AVIVA PLUS) w/Device KIT 1 each by Does not apply route 3 (three) times daily.  ? clotrimazole-betamethasone (LOTRISONE) cream Apply topically 2 (two) times daily.  ? cyclobenzaprine (FLEXERIL) 10 MG tablet Take 1 tablet (10 mg total) by mouth 3 (three) times daily as needed for muscle spasms.  ? glucose blood (ACCU-CHEK GUIDE) test strip Check sugars in am and before supper.  ? Insulin Pen Needle (NOVOFINE PEN NEEDLE) 32G X 6 MM MISC 1 each by Does not apply route every 7 (seven) days.  ? lacosamide (VIMPAT) 200 MG TABS tablet Take 1.5 tablets (300 mg total) by mouth 2 (two) times daily.  ? levETIRAcetam (KEPPRA) 1000 MG tablet Take 1 tablet (1,000 mg total) by mouth in the morning, at noon, and at bedtime.  ? losartan-hydrochlorothiazide (HYZAAR) 100-25 MG tablet Take 1 tablet by mouth daily.  ? montelukast (SINGULAIR) 10 MG tablet Take 1 tablet (10 mg total) by mouth daily. as directed  ? pantoprazole (PROTONIX) 40 MG tablet Take 1 tablet (40 mg total) by mouth 2 (two) times daily.  ? Semaglutide, 1 MG/DOSE, (OZEMPIC, 1 MG/DOSE,) 4 MG/3ML SOPN Inject 1 mg into the skin once a week.  ? TRESIBA FLEXTOUCH 100 UNIT/ML FlexTouch Pen Inject 26 Units into the skin daily.  ? ?No facility-administered encounter medications on file as of 08/01/2021.  ? ?  Pattricia Boss, CMA ?Clinical Pharmacist Assistant ?[phone] ? ?

## 2021-08-11 ENCOUNTER — Telehealth: Payer: Self-pay

## 2021-08-11 ENCOUNTER — Ambulatory Visit: Payer: Medicaid Other | Admitting: Neurology

## 2021-08-11 NOTE — Progress Notes (Signed)
Pt was reminded of appt with CPP on 3/29 ? ?Elray Mcgregor, CMA ?Clinical Pharmacist Assistant  ?[phone]  ?

## 2021-08-13 ENCOUNTER — Ambulatory Visit (INDEPENDENT_AMBULATORY_CARE_PROVIDER_SITE_OTHER): Payer: Medicaid Other

## 2021-08-13 DIAGNOSIS — G40309 Generalized idiopathic epilepsy and epileptic syndromes, not intractable, without status epilepticus: Secondary | ICD-10-CM

## 2021-08-13 DIAGNOSIS — I10 Essential (primary) hypertension: Secondary | ICD-10-CM

## 2021-08-13 DIAGNOSIS — E782 Mixed hyperlipidemia: Secondary | ICD-10-CM

## 2021-08-13 NOTE — Progress Notes (Signed)
? ?Chronic Care Management ?Pharmacy Note ? ?08/13/2021 ?Name:  Alicia Kelly MRN:  [id number] DOB:  [birth year]  ? ?Plan Recommendations:  ?Patient had sore throat and was not interested in talking today. Only spoke to her for about 5 minutes ?Patient frustrated with meds but she has not scheduled a f/u appt with PCP and has not maintained f/u appt with Neuro in the past which has limited her ability to get refills called in for meds ? ?Subjective: ?Alicia Kelly is an 66 y.o. year old female who is a primary patient of Henrene Pastor, Zeb Comfort, MD.  The CCM team was consulted for assistance with disease management and care coordination needs.   ? ?Engaged with patient by telephone for follow up visit in response to provider referral for pharmacy case management and/or care coordination services.  ? ?Consent to Services:  ?The patient was given the following information about Chronic Care Management services today, agreed to services, and gave verbal consent: 1. CCM service includes personalized support from designated clinical staff supervised by the primary care provider, including individualized plan of care and coordination with other care providers 2. 24/7 contact phone numbers for assistance for urgent and routine care needs. 3. Service will only be billed when office clinical staff spend 20 minutes or more in a month to coordinate care. 4. Only one practitioner may furnish and bill the service in a calendar month. 5.The patient may stop CCM services at any time (effective at the end of the month) by phone call to the office staff. 6. The patient will be responsible for cost sharing (co-pay) of up to 20% of the service fee (after annual deductible is met). Patient agreed to services and consent obtained. ? ?Patient Care Team: ?Lillard Anes, MD as PCP - General (Family Medicine) ?Lane Hacker, [facility] as Pharmacist (Pharmacist) ? ?Recent office visits:  ?01/22/21 Orders Only. Started Ozempic 44m/1.5ml  inject 0.5 into skin once weekly.  ?  ?01/15/21 PReinaldo MeekerMD. Seen of obesity/diabetes. No med changes. ?  ?01/01/21 PReinaldo MeekerMD. Seen for cough. No med changes.  ?  ?12/16/20 Orders Only. D/C Mycolog 1 application 2 times daily due to change in therapy. ?  ?11/04/20 PReinaldo MeekerMD. Seen for hospitalization follow up. D/C Omeprazole 40 mg daily due to change in therapy. ?  ?Recent consult visits:  ?10/28/20 (Neurology) FMaurie BoettcherMD. Seen for Generalized Epilepsy. Ordered Vimpat 200 mg take 1.5  2 times daily. Ordered Keppra 1000 mg 1 tablet three times daily. ?  ?10/25/20 (Oncology) LLavera GuiseMD. Seen for Leukocytosis. No med changes. ?  ?Hospital visits:  ?615/22-10/1720 ED visit. No notes available  ? ?Objective: ? ?Lab Results  ?Component Value Date  ? CREATININE 0.88 06/11/2021  ? BUN 19 06/11/2021  ? GFRNONAA 82 07/03/2020  ? GFRAA 95 07/03/2020  ? NA 134 06/11/2021  ? K 5.3 (H) 06/11/2021  ? CALCIUM 9.7 06/11/2021  ? CO2 23 06/11/2021  ? GLUCOSE 272 (H) 06/11/2021  ? ? ?Lab Results  ?Component Value Date/Time  ? HGBA1C 8.9 (H) 06/05/2021 09:17 AM  ? HGBA1C 9.0 (H) 04/23/2021 11:35 AM  ? MICROALBUR 10 07/03/2020 10:25 AM  ?  ?Last diabetic Eye exam: No results found for: HMDIABEYEEXA  ?Last diabetic Foot exam: No results found for: HMDIABFOOTEX  ? ?Lab Results  ?Component Value Date  ? CHOL 135 06/05/2021  ? HDL 49 06/05/2021  ? L[place]67 06/05/2021  ? TRIG 106 06/05/2021  ? CHOLHDL 2.8 06/05/2021  ? ? ? ?  Latest Ref Rng & Units 06/11/2021  ?  9:46 AM 06/05/2021  ?  9:17 AM 01/15/2021  ? 10:37 AM  ?Hepatic Function  ?Total Protein 6.0 - 8.5 g/dL 6.4   6.5   6.5    ?Albumin 3.8 - 4.8 g/dL 4.0   4.2   4.2    ?AST 0 - 40 IU/L '13   18   18    ' ?ALT 0 - 32 IU/L '13   17   21    ' ?Alk Phosphatase 44 - 121 IU/L 100   110   83    ?Total Bilirubin 0.0 - 1.2 mg/dL 0.2   0.2   0.3    ? ? ?Lab Results  ?Component Value Date/Time  ? TSH 1.170 06/05/2021 09:18 AM  ? ? ? ?  Latest Ref Rng & Units  06/05/2021  ?  9:17 AM 01/15/2021  ? 10:37 AM 10/25/2020  ? 12:00 AM  ?CBC  ?WBC 3.4 - 10.8 x10E3/uL 13.3   10.3   9.5    ?Hemoglobin 11.1 - 15.9 g/dL 9.1   11.2   11.7    ?Hematocrit 34.0 - 46.6 % 30.0   35.2   35    ?Platelets 150 - 450 x10E3/uL 720   438   389    ? ? ?No results found for: VD25OH ? ?Clinical ASCVD: No  ?The 10-year ASCVD risk score (Arnett DK, et al., 2019) is: 12.3% ?  Values used to calculate the score: ?    Age: 13 years ?    Sex: Female ?    Is Non-Hispanic African American: No ?    Diabetic: Yes ?    Tobacco smoker: No ?    Systolic Blood Pressure: 408 mmHg ?    Is BP treated: Yes ?    HDL Cholesterol: 49 mg/dL ?    Total Cholesterol: 135 mg/dL   ? ? ?  07/03/2020  ? 10:06 AM  ?Depression screen PHQ 2/9  ?Decreased Interest 0  ?Down, Depressed, Hopeless 0  ?PHQ - 2 Score 0  ?  ? ? ?Social History  ? ?Tobacco Use  ?Smoking Status Never  ?Smokeless Tobacco Never  ? ?BP Readings from Last 3 Encounters:  ?06/05/21 138/82  ?05/14/21 136/80  ?04/23/21 140/88  ? ?Pulse Readings from Last 3 Encounters:  ?06/05/21 92  ?05/14/21 84  ?04/23/21 83  ? ?Wt Readings from Last 3 Encounters:  ?06/05/21 248 lb 12.8 oz (112.9 kg)  ?05/14/21 249 lb (112.9 kg)  ?04/23/21 253 lb (114.8 kg)  ? ?BMI Readings from Last 3 Encounters:  ?06/05/21 42.71 kg/m?  ?05/14/21 42.74 kg/m?  ?04/23/21 40.84 kg/m?  ? ? ?Assessment/Interventions: Review of patient past medical history, allergies, medications, health status, including review of consultants reports, laboratory and other test data, was performed as part of comprehensive evaluation and provision of chronic care management services.  ? ?SDOH:  (Social Determinants of Health) assessments and interventions performed: Yes ? ? ?SDOH Screenings  ? ?Alcohol Screen: Low Risk   ? Last Alcohol Screening Score (AUDIT): 0  ?Depression (PHQ2-9): Not on file  ?Financial Resource Strain: Low Risk   ? Difficulty of Paying Living Expenses: Not hard at all  ?Food Insecurity: No Food  Insecurity  ? Worried About Charity fundraiser in the Last Year: Never true  ? Ran Out of Food in the Last Year: Never true  ?Housing: Low Risk   ? Last Housing Risk Score: 0  ?Physical Activity: Insufficiently  Active  ? Days of Exercise per Week: 2 days  ? Minutes of Exercise per Session: 20 min  ?Social Connections: Moderately Isolated  ? Frequency of Communication with Friends and Family: More than three times a week  ? Frequency of Social Gatherings with Friends and Family: Never  ? Attends Religious Services: Never  ? Active Member of Clubs or Organizations: No  ? Attends Archivist Meetings: Never  ? Marital Status: Married  ?Stress: No Stress Concern Present  ? Feeling of Stress : Not at all  ?Tobacco Use: Low Risk   ? Smoking Tobacco Use: Never  ? Smokeless Tobacco Use: Never  ? Passive Exposure: Not on file  ?Transportation Needs: No Transportation Needs  ? Lack of Transportation (Medical): No  ? Lack of Transportation (Non-Medical): No  ? ? ?CCM Care Plan ? ?Allergies  ?Allergen Reactions  ? Levofloxacin Other (See Comments)  ?  Throat swelling  ? Lisinopril Hives  ? Nitrofurantoin Swelling  ?  Throat swelling  ? Penicillins Anaphylaxis  ? Clarithromycin Other (See Comments)  ?  seizures  ? Sulfamethoxazole-Trimethoprim   ?  Causes seizures  ? ? ?Medications Reviewed Today   ? ? Reviewed by Lane Hacker, [facility] (Pharmacist) on 07/09/21 at [place] List Status: <None>  ? ?Medication Order Taking? Sig Documenting Provider Last Dose Status Informant  ?Accu-Chek Softclix Lancets lancets [id number] No TEST BLOOD SUGAR THREE TIMES DAILY Lillard Anes, MD Taking Active   ?atorvastatin (LIPITOR) 40 MG tablet [id number] No Take 1 tablet (40 mg total) by mouth daily. Lillard Anes, MD Taking Active   ?B-D UF III MINI PEN NEEDLES 31G X 5 MM MISC [id number] No USE 1 PEN NEEDLE DAILY Lillard Anes, MD Taking Active   ?Blood Glucose Calibration (ACCU-CHEK AVIVA) SOLN [id number] No   [provider] Taking Active   ?Blood Glucose Monitoring Suppl (ACCU-CHEK AVIVA PLUS) w/Device KIT [id number] No 1 each by Does not apply route 3 (three) times daily. Lillard Anes, MD Taking Active

## 2021-08-13 NOTE — Patient Instructions (Signed)
Visit Information ? ? Goals Addressed   ?None ?  ? ?Patient Care Plan: Pharmacy Care Plan  ?  ? ?Problem Identified: htn, hld, dm, gerd   ?Priority: High  ?Onset Date: 08/22/2020  ?  ? ?Long-Range Goal: Disease Management   ?Start Date: 08/22/2020  ?Expected End Date: 08/22/2021  ?Recent Progress: On track  ?Priority: High  ?Note:   ? ?Current Barriers:  ?Unable to achieve control of blood sugar  ? ?Pharmacist Clinical Goal(s):  ?Patient will achieve control of diabetes as evidenced by a1c through collaboration with PharmD and provider.  ? ?Interventions: ?1:1 collaboration with Lillard Anes, MD regarding development and update of comprehensive plan of care as evidenced by provider attestation and co-signature ?Inter-disciplinary care team collaboration (see longitudinal plan of care) ?Comprehensive medication review performed; medication list updated in electronic medical record ? ?Hypertension (BP goal <130/80) ?BP Readings from Last 3 Encounters:  ?04/23/21 140/88  ?01/15/21 118/80  ?01/01/21 (!) 150/90  ?-Not Ideally Controlled ?-Current treatment: ?losartan-hydrochlorothizide 100-25 mg daily Appropriate, Query effective, Safe, Accessible ?-Medications previously tried: lisinopril, amlodipine, irbesartan-hctz ?-Current home readings: goes up with hot flash per patient. Checks it at home and is "good". ~120s/86 ?-Current dietary habits: states little appetite ?-Current exercise habits: walks and shops but no formal exercise ?-Denies hypotensive/hypertensive symptoms ?-Educated on BP goals and benefits of medications for prevention of heart attack, stroke and kidney damage; ?Daily salt intake goal < 2300 mg; ?Exercise goal of 150 minutes per week; ?Importance of home blood pressure monitoring; ?-Counseled to monitor BP at home weekly, document, and provide log at future appointments ?-Counseled on diet and exercise extensively ?December 2022: BP typically controlled but patient was still upset from possible MS  Dx and ER trip yesterday. Will re-assess in 3 weeks at PCP f/u ?Jan 2023: Patient very non-compliant on meds, (See note for fill dates). Main priority for today's visit is to onboard to [facility] so I can ensure compliance, will evaluate after that in Feb ? ?Hyperlipidemia: (LDL goal < 70) ?The 10-year ASCVD risk score (Arnett DK, et al., 2019) is: 13.2% ?  Values used to calculate the score: ?    Age: 66 years ?    Sex: Female ?    Is Non-Hispanic African American: No ?    Diabetic: Yes ?    Tobacco smoker: No ?    Systolic Blood Pressure: 829 mmHg ?    Is BP treated: Yes ?    HDL Cholesterol: 52 mg/dL ?    Total Cholesterol: 153 mg/dL ?Lab Results  ?Component Value Date  ? CHOL 153 01/15/2021  ? CHOL 156 10/02/2020  ? CHOL 134 07/03/2020  ? ?Lab Results  ?Component Value Date  ? HDL 52 01/15/2021  ? HDL 61 10/02/2020  ? HDL 56 07/03/2020  ? ?Lab Results  ?Component Value Date  ? [place] 76 01/15/2021  ? [place] 75 10/02/2020  ? [place] 57 07/03/2020  ? ?Lab Results  ?Component Value Date  ? TRIG 143 01/15/2021  ? TRIG 110 10/02/2020  ? TRIG 118 07/03/2020  ? ?Lab Results  ?Component Value Date  ? CHOLHDL 2.9 01/15/2021  ? CHOLHDL 2.6 10/02/2020  ? CHOLHDL 2.4 07/03/2020  ?No results found for: LDLDIRECT ?-Controlled ?-Current treatment: ?atorvastatin 40 mg daily Appropriate, Query effective, Safe, Accessible ?-Medications previously tried: none reported  ?-Current dietary patterns: reports little appetite ?-Current exercise habits: no formal exercise but encouraged patient to begin walking her loop with her neighbor ?-Educated on Cholesterol goals;  ?  Benefits of statin for ASCVD risk reduction; ?Importance of limiting foods high in cholesterol; ?Exercise goal of 150 minutes per week; ?-Counseled on diet and exercise extensively ?Jan 2023: Patient very non-compliant on meds, (See note for fill dates). Main priority for today's visit is to onboard to [facility] so I can ensure compliance, will evaluate after that in  Feb ? ?Diabetes (A1c goal <7%) ?Lab Results  ?Component Value Date  ? HGBA1C 8.9 (H) 01/15/2021  ? HGBA1C 7.9 (H) 10/02/2020  ? HGBA1C 8.2 (H) 07/03/2020  ? ?Lab Results  ?Component Value Date  ? MICROALBUR 10 07/03/2020  ? [place] 76 01/15/2021  ? CREATININE 0.81 01/15/2021  ? ?Lab Results  ?Component Value Date  ? NA 136 01/15/2021  ? K 4.7 01/15/2021  ? CREATININE 0.81 01/15/2021  ? EGFR 81 01/15/2021  ? GFRNONAA 82 07/03/2020  ? GLUCOSE 257 (H) 01/15/2021  ? ?Lab Results  ?Component Value Date  ? WBC 10.3 01/15/2021  ? HGB 11.2 01/15/2021  ? HCT 35.2 01/15/2021  ? MCV 88 01/15/2021  ? PLT 438 01/15/2021  ?-Uncontrolled ?-Current medications: ?accu-chek softclix Appropriate, Effective, Safe, Accessible ?accu-chek aviva Appropriate, Effective, Safe, Accessible ?Pen needles daily Appropriate, Effective, Safe, Accessible ?tresiba flextouch 30 units daily Appropriate, Query effective, Safe, Accessible ?Ozempic 73m/week Appropriate, Query effective, Safe, Accessible ?-Medications previously tried: metformin, Glipizide (Dc'd December 2022) ?-Current home glucose readings ?fasting glucose:  ?April 2022: 168, 202 ?Jan 2023: Didn't have readings on her ?Feb 2023: Sugars around 170, patient doesn't write them down ?post prandial glucose: not reported ?-Denies hypoglycemic/hyperglycemic symptoms ?-Current meal patterns: Doesn't eat bread or sandwiches. Less appetite lately.  ?breakfast: eggs, eats 1 banana a day ?lunch:  sandwich and banana ?dinner:  hamburger, cream potatoes and gravy ?snacks:  limited ?drinks:  water, tea,  ?-Current exercise:  walks, shops, enjoys dancing but hasn't been since COVID ?-Educated on A1c and blood sugar goals; ?Complications of diabetes including kidney damage, retinal damage, and cardiovascular disease; ?Exercise goal of 150 minutes per week; ?Benefits of routine self-monitoring of blood sugar; ?Carbohydrate counting and/or plate method ?-Counseled to check feet daily and get yearly eye  exams ?-Counseled on diet and exercise extensively ?December 2022: Patient is very upset about recent trip to ER and possible MS Dx. Because of this, didn't talk too much about sugars. She did state she has not picked up Ozempic because Pharmacy is unable to get/fill it. After speaking with patient, she expressed frustrations about having to take all the meds, organize them, keep track of refills, and have to make phone calls just to take a medicine she doesn't want. Explained [facility] process and she stated she'd like to start using in Jan once her new insurance starts. She wants to wait on the Ozempic until then ?Verbal consent obtained for [facility] enhanced pharmacy services (medication synchronization, adherence packaging, delivery coordination). A medication sync plan was created to allow patient to get all medications delivered once every 30 to 90 days per patient preference. Patient understands they have freedom to choose pharmacy and clinical pharmacist will coordinate care between all prescribers and [facility]. ?Jan 2023: Patient very non-compliant on meds, (See note for fill dates). Main priority for today's visit is to onboard to [facility] so I can ensure compliance, will evaluate after that in Feb ?Feb 2023: Patient taking meds on schedule. Sugars in 170's per patient, she doesn't write them down. Will start writing down. Recommend adding Metformin (Patient states it never gave her ADR's but that it  didn't bring her sugars down). ?-07/15/21: Told patient to increase Tresiba to 30 units per msg from provider ? ? ?Epilepsy (Goal: seizure control) ?-Controlled ?-Current treatment  ?Vimpat 200 mg - 1.5 tablets twice daily Appropriate, Effective, Safe, Accessible ?Keppra 1000 mg tid Appropriate, Effective, Safe, Accessible ?-Medications previously tried: dilantin  ?-Recommended to continue current medication ? ?GERD (Goal: manage symptoms and prevent recurrence of stomach  bleed) ?-Controlled ?-Current treatment  ?Pantoprazole 40 mg bid (Active bleeding ulcers) Appropriate, Effective, Safe, Accessible ?-Medications previously tried:  omeprazole, Zantac ?-Counseled on diet and exercise extensivel

## 2021-08-15 DIAGNOSIS — E782 Mixed hyperlipidemia: Secondary | ICD-10-CM

## 2021-08-15 DIAGNOSIS — I10 Essential (primary) hypertension: Secondary | ICD-10-CM

## 2021-08-20 DIAGNOSIS — Z79899 Other long term (current) drug therapy: Secondary | ICD-10-CM | POA: Diagnosis not present

## 2021-08-22 ENCOUNTER — Telehealth: Payer: Self-pay

## 2021-08-22 DIAGNOSIS — R110 Nausea: Secondary | ICD-10-CM | POA: Diagnosis not present

## 2021-08-22 DIAGNOSIS — K219 Gastro-esophageal reflux disease without esophagitis: Secondary | ICD-10-CM | POA: Diagnosis not present

## 2021-08-22 NOTE — Chronic Care Management (AMB) (Signed)
? ? ?  Chronic Care Management ?Pharmacy Assistant  ? ?Name: Alicia Kelly  MRN: [id number] DOB: Dec 30, 1955 ? ?Reason for Encounter: Pharmacy follow up ?  ?Medications: ?Outpatient Encounter Medications as of 08/22/2021  ?Medication Sig  ? Accu-Chek Softclix Lancets lancets TEST BLOOD SUGAR THREE TIMES DAILY  ? atorvastatin (LIPITOR) 40 MG tablet Take 1 tablet (40 mg total) by mouth daily.  ? B-D UF III MINI PEN NEEDLES 31G X 5 MM MISC USE 1 PEN NEEDLE DAILY  ? Blood Glucose Calibration (ACCU-CHEK AVIVA) SOLN   ? Blood Glucose Monitoring Suppl (ACCU-CHEK AVIVA PLUS) w/Device KIT 1 each by Does not apply route 3 (three) times daily.  ? clotrimazole-betamethasone (LOTRISONE) cream Apply topically 2 (two) times daily.  ? cyclobenzaprine (FLEXERIL) 10 MG tablet Take 1 tablet (10 mg total) by mouth 3 (three) times daily as needed for muscle spasms.  ? glucose blood (ACCU-CHEK GUIDE) test strip Check sugars in am and before supper.  ? Insulin Pen Needle (NOVOFINE PEN NEEDLE) 32G X 6 MM MISC 1 each by Does not apply route every 7 (seven) days.  ? lacosamide (VIMPAT) 200 MG TABS tablet Take 1.5 tablets (300 mg total) by mouth 2 (two) times daily.  ? levETIRAcetam (KEPPRA) 1000 MG tablet Take 1 tablet (1,000 mg total) by mouth in the morning, at noon, and at bedtime.  ? losartan-hydrochlorothiazide (HYZAAR) 100-25 MG tablet Take 1 tablet by mouth daily.  ? montelukast (SINGULAIR) 10 MG tablet Take 1 tablet (10 mg total) by mouth daily. as directed  ? pantoprazole (PROTONIX) 40 MG tablet Take 1 tablet (40 mg total) by mouth 2 (two) times daily.  ? Semaglutide, 1 MG/DOSE, (OZEMPIC, 1 MG/DOSE,) 4 MG/3ML SOPN Inject 1 mg into the skin once a week.  ? TRESIBA FLEXTOUCH 100 UNIT/ML FlexTouch Pen Inject 26 Units into the skin daily.  ? ?No facility-administered encounter medications on file as of 08/22/2021.  ? ?08-22-2021: Received message form [facility]: "[name] DOB: July 04, 1955 is on my list to upload today, but I  know the last time I spoke to her she wanted to transfer pharmacies but then we were told to hold off. Do we know if she is going to transfer or stay with [place]?". Patient stated she would like all medications transferred to [facility]. Updated [facility]. ? ?Jeannette How CMA ?Clinical Pharmacist Assistant ?[phone] ? ?

## 2021-09-03 ENCOUNTER — Other Ambulatory Visit: Payer: Self-pay

## 2021-09-03 DIAGNOSIS — B3749 Other urogenital candidiasis: Secondary | ICD-10-CM

## 2021-09-03 DIAGNOSIS — I10 Essential (primary) hypertension: Secondary | ICD-10-CM

## 2021-09-03 DIAGNOSIS — T7840XD Allergy, unspecified, subsequent encounter: Secondary | ICD-10-CM

## 2021-09-03 DIAGNOSIS — L89102 Pressure ulcer of unspecified part of back, stage 2: Secondary | ICD-10-CM

## 2021-09-03 DIAGNOSIS — E782 Mixed hyperlipidemia: Secondary | ICD-10-CM

## 2021-09-03 MED ORDER — ATORVASTATIN CALCIUM 40 MG PO TABS: 40.0000 mg | ORAL_TABLET | Freq: Every day | ORAL | 2 refills | Status: DC

## 2021-09-03 MED ORDER — CLOTRIMAZOLE-BETAMETHASONE 1-0.05 % EX CREA: TOPICAL_CREAM | Freq: Two times a day (BID) | CUTANEOUS | 3 refills | Status: DC

## 2021-09-03 MED ORDER — LOSARTAN POTASSIUM-HCTZ 100-25 MG PO TABS: 1.0000 | ORAL_TABLET | Freq: Every day | ORAL | 2 refills | Status: DC

## 2021-09-03 MED ORDER — NYSTATIN 100000 UNIT/GM EX CREA: TOPICAL_CREAM | Freq: Two times a day (BID) | CUTANEOUS | Status: DC

## 2021-09-03 MED ORDER — MONTELUKAST SODIUM 10 MG PO TABS: 10.0000 mg | ORAL_TABLET | Freq: Every day | ORAL | 2 refills | Status: DC

## 2021-09-03 NOTE — Telephone Encounter (Signed)
Sent to [facility] per Dr. Henrene Pastor.  ?

## 2021-09-10 ENCOUNTER — Ambulatory Visit (INDEPENDENT_AMBULATORY_CARE_PROVIDER_SITE_OTHER): Payer: Medicare Other

## 2021-09-10 DIAGNOSIS — I10 Essential (primary) hypertension: Secondary | ICD-10-CM

## 2021-09-10 DIAGNOSIS — E669 Obesity, unspecified: Secondary | ICD-10-CM

## 2021-09-10 DIAGNOSIS — E782 Mixed hyperlipidemia: Secondary | ICD-10-CM

## 2021-09-10 NOTE — Patient Instructions (Signed)
Visit Information ? ? Goals Addressed   ?None ?  ? ?Patient Care Plan: Pharmacy Care Plan  ?  ? ?Problem Identified: htn, hld, dm, gerd   ?Priority: High  ?Onset Date: 08/22/2020  ?  ? ?Long-Range Goal: Disease Management   ?Start Date: 08/22/2020  ?Expected End Date: 08/22/2021  ?Recent Progress: On track  ?Priority: High  ?Note:   ? ?Current Barriers:  ?Unable to achieve control of blood sugar  ? ?Pharmacist Clinical Goal(s):  ?Patient will achieve control of diabetes as evidenced by a1c through collaboration with PharmD and provider.  ? ?Interventions: ?1:1 collaboration with Lillard Anes, MD regarding development and update of comprehensive plan of care as evidenced by provider attestation and co-signature ?Inter-disciplinary care team collaboration (see longitudinal plan of care) ?Comprehensive medication review performed; medication list updated in electronic medical record ? ?Hypertension (BP goal <130/80) ?BP Readings from Last 3 Encounters:  ?04/23/21 140/88  ?01/15/21 118/80  ?01/01/21 (!) 150/90  ?-Not Ideally Controlled ?-Current treatment: ?losartan-hydrochlorothizide 100-25 mg daily Appropriate, Query effective, Safe, Accessible ?-Medications previously tried: lisinopril, amlodipine, irbesartan-hctz ?-Current home readings: goes up with hot flash per patient. Checks it at home and is "good". ~120s/86 ?-Current dietary habits: states little appetite ?-Current exercise habits: walks and shops but no formal exercise ?-Denies hypotensive/hypertensive symptoms ?-Educated on BP goals and benefits of medications for prevention of heart attack, stroke and kidney damage; ?Daily salt intake goal < 2300 mg; ?Exercise goal of 150 minutes per week; ?Importance of home blood pressure monitoring; ?-Counseled to monitor BP at home weekly, document, and provide log at future appointments ?-Counseled on diet and exercise extensively ?December 2022: BP typically controlled but patient was still upset from possible MS  Dx and ER trip yesterday. Will re-assess in 3 weeks at PCP f/u ?Jan 2023: Patient very non-compliant on meds, (See note for fill dates). Main priority for today's visit is to onboard to [facility] so I can ensure compliance, will evaluate after that in Feb ?April 2023: Non-compliant on meds but patient denies. Unsure what to do to help ? ?Hyperlipidemia: (LDL goal < 70) ?The 10-year ASCVD risk score (Arnett DK, et al., 2019) is: 13.2% ?  Values used to calculate the score: ?    Age: 66 years ?    Sex: Female ?    Is Non-Hispanic African American: No ?    Diabetic: Yes ?    Tobacco smoker: No ?    Systolic Blood Pressure: 732 mmHg ?    Is BP treated: Yes ?    HDL Cholesterol: 52 mg/dL ?    Total Cholesterol: 153 mg/dL ?Lab Results  ?Component Value Date  ? CHOL 153 01/15/2021  ? CHOL 156 10/02/2020  ? CHOL 134 07/03/2020  ? ?Lab Results  ?Component Value Date  ? HDL 52 01/15/2021  ? HDL 61 10/02/2020  ? HDL 56 07/03/2020  ? ?Lab Results  ?Component Value Date  ? [place] 76 01/15/2021  ? [place] 75 10/02/2020  ? [place] 57 07/03/2020  ? ?Lab Results  ?Component Value Date  ? TRIG 143 01/15/2021  ? TRIG 110 10/02/2020  ? TRIG 118 07/03/2020  ? ?Lab Results  ?Component Value Date  ? CHOLHDL 2.9 01/15/2021  ? CHOLHDL 2.6 10/02/2020  ? CHOLHDL 2.4 07/03/2020  ?No results found for: LDLDIRECT ?-Controlled ?-Current treatment: ?atorvastatin 40 mg daily Appropriate, Query effective, Safe, Accessible ?-Medications previously tried: none reported  ?-Current dietary patterns: reports little appetite ?-Current exercise habits: no formal exercise but encouraged  patient to begin walking her loop with her neighbor ?-Educated on Cholesterol goals;  ?Benefits of statin for ASCVD risk reduction; ?Importance of limiting foods high in cholesterol; ?Exercise goal of 150 minutes per week; ?-Counseled on diet and exercise extensively ?Jan 2023: Patient very non-compliant on meds, (See note for fill dates). Main priority for today's visit is  to onboard to [facility] so I can ensure compliance, will evaluate after that in Feb ?April 2023: Non-compliant on meds but patient denies. Unsure what to do to help ? ?Diabetes (A1c goal <7%) ?Lab Results  ?Component Value Date  ? HGBA1C 8.9 (H) 01/15/2021  ? HGBA1C 7.9 (H) 10/02/2020  ? HGBA1C 8.2 (H) 07/03/2020  ? ?Lab Results  ?Component Value Date  ? MICROALBUR 10 07/03/2020  ? [place] 76 01/15/2021  ? CREATININE 0.81 01/15/2021  ? ?Lab Results  ?Component Value Date  ? NA 136 01/15/2021  ? K 4.7 01/15/2021  ? CREATININE 0.81 01/15/2021  ? EGFR 81 01/15/2021  ? GFRNONAA 82 07/03/2020  ? GLUCOSE 257 (H) 01/15/2021  ? ?Lab Results  ?Component Value Date  ? WBC 10.3 01/15/2021  ? HGB 11.2 01/15/2021  ? HCT 35.2 01/15/2021  ? MCV 88 01/15/2021  ? PLT 438 01/15/2021  ?-Uncontrolled ?-Current medications: ?accu-chek softclix Appropriate, Effective, Safe, Accessible ?accu-chek aviva Appropriate, Effective, Safe, Accessible ?Pen needles daily Appropriate, Effective, Safe, Accessible ?tresiba flextouch 30 units daily Appropriate, Query effective, Safe, Accessible ?Ozempic 37m/week Appropriate, Query effective, Safe, Accessible ?-Medications previously tried: metformin, Glipizide (Dc'd December 2022) ?-Current home glucose readings ?fasting glucose:  ?April 2022: 168, 202 ?Jan 2023: Didn't have readings on her ?Feb 2023: Sugars around 170, patient doesn't write them down ?April 2023:  ?09/09/21: 127 ?09/08/21: 162 ?Lowest: 127 ?Highest: 200's in morning ?post prandial glucose: not reported ?-Denies hypoglycemic/hyperglycemic symptoms ?-Current meal patterns: Doesn't eat bread or sandwiches. Less appetite lately.  ?breakfast: eggs, eats 1 banana a day ?lunch:  sandwich and banana ?dinner:  hamburger, cream potatoes and gravy ?snacks:  limited ?drinks:  water, tea,  ?-Current exercise:  walks, shops, enjoys dancing but hasn't been since COVID ?-Educated on A1c and blood sugar goals; ?Complications of diabetes including kidney  damage, retinal damage, and cardiovascular disease; ?Exercise goal of 150 minutes per week; ?Benefits of routine self-monitoring of blood sugar; ?Carbohydrate counting and/or plate method ?-Counseled to check feet daily and get yearly eye exams ?-Counseled on diet and exercise extensively ?December 2022: Patient is very upset about recent trip to ER and possible MS Dx. Because of this, didn't talk too much about sugars. She did state she has not picked up Ozempic because Pharmacy is unable to get/fill it. After speaking with patient, she expressed frustrations about having to take all the meds, organize them, keep track of refills, and have to make phone calls just to take a medicine she doesn't want. Explained [facility] process and she stated she'd like to start using in Jan once her new insurance starts. She wants to wait on the Ozempic until then ?Verbal consent obtained for [facility] enhanced pharmacy services (medication synchronization, adherence packaging, delivery coordination). A medication sync plan was created to allow patient to get all medications delivered once every 30 to 90 days per patient preference. Patient understands they have freedom to choose pharmacy and clinical pharmacist will coordinate care between all prescribers and [facility]. ?Jan 2023: Patient very non-compliant on meds, (See note for fill dates). Main priority for today's visit is to onboard to [facility] so I can ensure  compliance, will evaluate after that in Feb ?Feb 2023: Patient taking meds on schedule. Sugars in 170's per patient, she doesn't write them down. Will start writing down. Recommend adding Metformin (Patient states it never gave her ADR's but that it didn't bring her sugars down). ?-07/15/21: Told patient to increase Tresiba to 30 units per msg from provider ?April 2023: Non-compliant on meds but patient denies. Unsure what to do. Will let PCP know of her sugar readings. Will try to have CCM call monthly  to get sugar readings. Patient told me she is thinking about leaving practice ? ? ?Epilepsy (Goal: seizure control) ?-Controlled ?-Current treatment  ?Vimpat 200 mg - 1.5 tablets twice daily [place]

## 2021-09-10 NOTE — Progress Notes (Signed)
? ?Chronic Care Management ?Pharmacy Note ? ?09/10/2021 ?Name:  Alicia Kelly MRN:  [id number] DOB:  07-27-1955  ? ?Plan Recommendations:  ?Patient finally saw Neuro and is getting meds from them ?Patient has no f/u scheduled with PCP, could we get her back? ?Sugars are elevated ?09/09/21: 127 ?09/08/21: 162 ?Lowest is 127 in a few weeks ?200's in the mornings ? ?Subjective: ?Alicia Kelly is an 66 y.o. year old female who is a primary patient of Henrene Pastor, Zeb Comfort, MD.  The CCM team was consulted for assistance with disease management and care coordination needs.   ? ?Engaged with patient by telephone for follow up visit in response to provider referral for pharmacy case management and/or care coordination services.  ? ?Consent to Services:  ?The patient was given the following information about Chronic Care Management services today, agreed to services, and gave verbal consent: 1. CCM service includes personalized support from designated clinical staff supervised by the primary care provider, including individualized plan of care and coordination with other care providers 2. 24/7 contact phone numbers for assistance for urgent and routine care needs. 3. Service will only be billed when office clinical staff spend 20 minutes or more in a month to coordinate care. 4. Only one practitioner may furnish and bill the service in a calendar month. 5.The patient may stop CCM services at any time (effective at the end of the month) by phone call to the office staff. 6. The patient will be responsible for cost sharing (co-pay) of up to 20% of the service fee (after annual deductible is met). Patient agreed to services and consent obtained. ? ?Patient Care Team: ?Lillard Anes, MD as PCP - General (Family Medicine) ?Lane Hacker, [facility] as Pharmacist (Pharmacist) ? ?Recent office visits:  ?01/22/21 Orders Only. Started Ozempic 54m/1.5ml inject 0.5 into skin once weekly.  ?  ?01/15/21 PReinaldo MeekerMD. Seen of  obesity/diabetes. No med changes. ?  ?01/01/21 PReinaldo MeekerMD. Seen for cough. No med changes.  ?  ?12/16/20 Orders Only. D/C Mycolog 1 application 2 times daily due to change in therapy. ?  ?11/04/20 PReinaldo MeekerMD. Seen for hospitalization follow up. D/C Omeprazole 40 mg daily due to change in therapy. ?  ?Recent consult visits:  ?10/28/20 (Neurology) FMaurie BoettcherMD. Seen for Generalized Epilepsy. Ordered Vimpat 200 mg take 1.5  2 times daily. Ordered Keppra 1000 mg 1 tablet three times daily. ?  ?10/25/20 (Oncology) LLavera GuiseMD. Seen for Leukocytosis. No med changes. ?  ?Hospital visits:  ?615/22-10/1720 ED visit. No notes available  ? ?Objective: ? ?Lab Results  ?Component Value Date  ? CREATININE 0.88 06/11/2021  ? BUN 19 06/11/2021  ? GFRNONAA 82 07/03/2020  ? GFRAA 95 07/03/2020  ? NA 134 06/11/2021  ? K 5.3 (H) 06/11/2021  ? CALCIUM 9.7 06/11/2021  ? CO2 23 06/11/2021  ? GLUCOSE 272 (H) 06/11/2021  ? ? ?Lab Results  ?Component Value Date/Time  ? HGBA1C 8.9 (H) 06/05/2021 09:17 AM  ? HGBA1C 9.0 (H) 04/23/2021 11:35 AM  ? MICROALBUR 10 07/03/2020 10:25 AM  ?  ?Last diabetic Eye exam: No results found for: HMDIABEYEEXA  ?Last diabetic Foot exam: No results found for: HMDIABFOOTEX  ? ?Lab Results  ?Component Value Date  ? CHOL 135 06/05/2021  ? HDL 49 06/05/2021  ? L[place]67 06/05/2021  ? TRIG 106 06/05/2021  ? CHOLHDL 2.8 06/05/2021  ? ? ? ?  Latest Ref Rng & Units 06/11/2021  ?  9:46  AM 06/05/2021  ?  9:17 AM 01/15/2021  ? 10:37 AM  ?Hepatic Function  ?Total Protein 6.0 - 8.5 g/dL 6.4   6.5   6.5    ?Albumin 3.8 - 4.8 g/dL 4.0   4.2   4.2    ?AST 0 - 40 IU/L '13   18   18    ' ?ALT 0 - 32 IU/L '13   17   21    ' ?Alk Phosphatase 44 - 121 IU/L 100   110   83    ?Total Bilirubin 0.0 - 1.2 mg/dL 0.2   0.2   0.3    ? ? ?Lab Results  ?Component Value Date/Time  ? TSH 1.170 06/05/2021 09:18 AM  ? ? ? ?  Latest Ref Rng & Units 06/05/2021  ?  9:17 AM 01/15/2021  ? 10:37 AM 10/25/2020  ? 12:00 AM  ?CBC  ?WBC 3.4 -  10.8 x10E3/uL 13.3   10.3   9.5    ?Hemoglobin 11.1 - 15.9 g/dL 9.1   11.2   11.7    ?Hematocrit 34.0 - 46.6 % 30.0   35.2   35    ?Platelets 150 - 450 x10E3/uL 720   438   389    ? ? ?No results found for: VD25OH ? ?Clinical ASCVD: No  ?The 10-year ASCVD risk score (Arnett DK, et al., 2019) is: 12.3% ?  Values used to calculate the score: ?    Age: 60 years ?    Sex: Female ?    Is Non-Hispanic African American: No ?    Diabetic: Yes ?    Tobacco smoker: No ?    Systolic Blood Pressure: 885 mmHg ?    Is BP treated: Yes ?    HDL Cholesterol: 49 mg/dL ?    Total Cholesterol: 135 mg/dL   ? ? ?  07/03/2020  ? 10:06 AM  ?Depression screen PHQ 2/9  ?Decreased Interest 0  ?Down, Depressed, Hopeless 0  ?PHQ - 2 Score 0  ?  ? ? ?Social History  ? ?Tobacco Use  ?Smoking Status Never  ?Smokeless Tobacco Never  ? ?BP Readings from Last 3 Encounters:  ?06/05/21 138/82  ?05/14/21 136/80  ?04/23/21 140/88  ? ?Pulse Readings from Last 3 Encounters:  ?06/05/21 92  ?05/14/21 84  ?04/23/21 83  ? ?Wt Readings from Last 3 Encounters:  ?06/05/21 248 lb 12.8 oz (112.9 kg)  ?05/14/21 249 lb (112.9 kg)  ?04/23/21 253 lb (114.8 kg)  ? ?BMI Readings from Last 3 Encounters:  ?06/05/21 42.71 kg/m?  ?05/14/21 42.74 kg/m?  ?04/23/21 40.84 kg/m?  ? ? ?Assessment/Interventions: Review of patient past medical history, allergies, medications, health status, including review of consultants reports, laboratory and other test data, was performed as part of comprehensive evaluation and provision of chronic care management services.  ? ?SDOH:  (Social Determinants of Health) assessments and interventions performed: Yes ?SDOH Interventions   ? ?Flowsheet Row Most Recent Value  ?SDOH Interventions   ?Financial Strain Interventions Intervention Not Indicated  ?Transportation Interventions Intervention Not Indicated  ? ?  ? ? ?SDOH Screenings  ? ?Alcohol Screen: Low Risk   ? Last Alcohol Screening Score (AUDIT): 0  ?Depression (PHQ2-9): Not on file   ?Financial Resource Strain: Low Risk   ? Difficulty of Paying Living Expenses: Not hard at all  ?Food Insecurity: Not on file  ?Housing: Not on file  ?Physical Activity: Insufficiently Active  ? Days of Exercise per Week: 2 days  ?  Minutes of Exercise per Session: 20 min  ?Social Connections: Moderately Isolated  ? Frequency of Communication with Friends and Family: More than three times a week  ? Frequency of Social Gatherings with Friends and Family: Never  ? Attends Religious Services: Never  ? Active Member of Clubs or Organizations: No  ? Attends Archivist Meetings: Never  ? Marital Status: Married  ?Stress: No Stress Concern Present  ? Feeling of Stress : Not at all  ?Tobacco Use: Low Risk   ? Smoking Tobacco Use: Never  ? Smokeless Tobacco Use: Never  ? Passive Exposure: Not on file  ?Transportation Needs: No Transportation Needs  ? Lack of Transportation (Medical): No  ? Lack of Transportation (Non-Medical): No  ? ? ?CCM Care Plan ? ?Allergies  ?Allergen Reactions  ? Levofloxacin Other (See Comments)  ?  Throat swelling  ? Lisinopril Hives  ? Nitrofurantoin Swelling  ?  Throat swelling  ? Penicillins Anaphylaxis  ? Clarithromycin Other (See Comments)  ?  seizures  ? Sulfamethoxazole-Trimethoprim   ?  Causes seizures  ? ? ?Medications Reviewed Today   ? ? Reviewed by Lane Hacker, [facility] (Pharmacist) on 09/10/21 at 1544  Med List Status: <None>  ? ?Medication Order Taking? Sig Documenting Provider Last Dose Status Informant  ?Accu-Chek Softclix Lancets lancets [id number] No TEST BLOOD SUGAR THREE TIMES DAILY Lillard Anes, MD Taking Active   ?atorvastatin (LIPITOR) 40 MG tablet [id number]  Take 1 tablet (40 mg total) by mouth daily. Lillard Anes, MD  Active   ?B-D UF III MINI PEN NEEDLES 31G X 5 MM MISC [id number] No USE 1 PEN NEEDLE DAILY Lillard Anes, MD Taking Active   ?Blood Glucose Calibration (ACCU-CHEK AVIVA) SOLN [id number] No  [provider] Taking  Active   ?Blood Glucose Monitoring Suppl (ACCU-CHEK AVIVA PLUS) w/Device KIT [id number] No 1 each by Does not apply route 3 (three) times daily. Lillard Anes, MD Taking Active   ?clotrimazole-betamethasone (

## 2021-09-14 DIAGNOSIS — E782 Mixed hyperlipidemia: Secondary | ICD-10-CM

## 2021-09-14 DIAGNOSIS — E1159 Type 2 diabetes mellitus with other circulatory complications: Secondary | ICD-10-CM

## 2021-09-14 DIAGNOSIS — I152 Hypertension secondary to endocrine disorders: Secondary | ICD-10-CM

## 2021-09-14 DIAGNOSIS — E669 Obesity, unspecified: Secondary | ICD-10-CM | POA: Diagnosis not present

## 2021-09-14 DIAGNOSIS — I10 Essential (primary) hypertension: Secondary | ICD-10-CM | POA: Diagnosis not present

## 2021-09-14 DIAGNOSIS — E1169 Type 2 diabetes mellitus with other specified complication: Secondary | ICD-10-CM | POA: Diagnosis not present

## 2021-09-22 DIAGNOSIS — B372 Candidiasis of skin and nail: Secondary | ICD-10-CM | POA: Diagnosis not present

## 2021-09-22 DIAGNOSIS — N3000 Acute cystitis without hematuria: Secondary | ICD-10-CM | POA: Diagnosis not present

## 2021-09-25 ENCOUNTER — Other Ambulatory Visit: Payer: Self-pay

## 2021-09-25 DIAGNOSIS — E1169 Type 2 diabetes mellitus with other specified complication: Secondary | ICD-10-CM

## 2021-09-25 MED ORDER — ACCU-CHEK GUIDE VI STRP: ORAL_STRIP | 3 refills | Status: DC

## 2021-10-02 ENCOUNTER — Other Ambulatory Visit: Payer: Self-pay

## 2021-10-02 DIAGNOSIS — L89102 Pressure ulcer of unspecified part of back, stage 2: Secondary | ICD-10-CM

## 2021-10-02 MED ORDER — CLOTRIMAZOLE-BETAMETHASONE 1-0.05 % EX CREA: TOPICAL_CREAM | Freq: Two times a day (BID) | CUTANEOUS | 3 refills | Status: DC

## 2021-10-10 ENCOUNTER — Ambulatory Visit (INDEPENDENT_AMBULATORY_CARE_PROVIDER_SITE_OTHER): Payer: Medicare Other | Admitting: Physician Assistant

## 2021-10-10 ENCOUNTER — Encounter: Payer: Self-pay | Admitting: Physician Assistant

## 2021-10-10 VITALS — BP 124/70 | HR 88 | Temp 98.8°F | Resp 16 | Ht 64.0 in | Wt 249.0 lb

## 2021-10-10 DIAGNOSIS — B354 Tinea corporis: Secondary | ICD-10-CM

## 2021-10-10 MED ORDER — TERBINAFINE HCL 250 MG PO TABS: 250.0000 mg | ORAL_TABLET | Freq: Every day | ORAL | 0 refills | Status: DC

## 2021-10-10 MED ORDER — CLOTRIMAZOLE-BETAMETHASONE 1-0.05 % EX CREA: 1.0000 "application " | TOPICAL_CREAM | Freq: Every day | CUTANEOUS | 0 refills | Status: DC

## 2021-10-12 ENCOUNTER — Encounter: Payer: Self-pay | Admitting: Physician Assistant

## 2021-10-12 NOTE — Progress Notes (Signed)
Acute Office Visit  Subjective:    Patient ID: Alicia Kelly, female    DOB: 09/23/1955, 66 y.o.   MRN: [id number]  Chief Complaint  Patient presents with   ulcer on bottom   Recurrent Skin Infections    Right sided RLQ    HPI: Patient is in today for complaints of recurrent rash under pannus that extends to buttocks - is red, inflamed and burns at times - has tried lotrisone (requests refill) and antibiotics but symptoms have persisted for more than 2 weeks now  Past Medical History:  Diagnosis Date   Asthma    Calculus of gallbladder with chronic cholecystitis without obstruction 03/22/2017   Diabetes ([place])    Epilepsy ([place])    Hypertension    Sleep apnea     Past Surgical History:  Procedure Laterality Date   APPENDECTOMY     TONSILLECTOMY     TONSILLECTOMY      Family History  Problem Relation Age of Onset   Colon cancer Mother        d. 46   Hypertension Father    Diabetes Father    Allergic rhinitis Sister    Hypertension Sister    Colon cancer Maternal Grandmother        dx 31s   Colon cancer Paternal Grandmother        dx 58s   Diabetes Paternal Grandmother    Cancer Maternal Uncle    Breast cancer Neg Hx     Social History   Socioeconomic History   Marital status: Married    Spouse name: Not on file   Number of children: Not on file   Years of education: Not on file   Highest education level: Not on file  Occupational History   Not on file  Tobacco Use   Smoking status: Never   Smokeless tobacco: Never  Vaping Use   Vaping Use: Never used  Substance and Sexual Activity   Alcohol use: Not Currently   Drug use: Never   Sexual activity: Not on file  Other Topics Concern   Not on file  Social History Narrative   Not on file   Social Determinants of Health   Financial Resource Strain: Low Risk    Difficulty of Paying Living Expenses: Not hard at all  Food Insecurity: Not on file  Transportation Needs: No Transportation Needs   Lack  of Transportation (Medical): No   Lack of Transportation (Non-Medical): No  Physical Activity: Insufficiently Active   Days of Exercise per Week: 2 days   Minutes of Exercise per Session: 20 min  Stress: No Stress Concern Present   Feeling of Stress : Not at all  Social Connections: Moderately Isolated   Frequency of Communication with Friends and Family: More than three times a week   Frequency of Social Gatherings with Friends and Family: Never   Attends Religious Services: Never   Marine scientist or Organizations: No   Attends Music therapist: Never   Marital Status: Married  Human resources officer Violence: Not At Risk   Fear of Current or Ex-Partner: No   Emotionally Abused: No   Physically Abused: No   Sexually Abused: No    Outpatient Medications Prior to Visit  Medication Sig Dispense Refill   Accu-Chek Softclix Lancets lancets TEST BLOOD SUGAR THREE TIMES DAILY 300 each 1   atorvastatin (LIPITOR) 40 MG tablet Take 1 tablet (40 mg total) by mouth daily. 90 tablet 2   B-D  UF III MINI PEN NEEDLES 31G X 5 MM MISC USE 1 PEN NEEDLE DAILY 100 each 2   Blood Glucose Calibration (ACCU-CHEK AVIVA) SOLN      Blood Glucose Monitoring Suppl (ACCU-CHEK AVIVA PLUS) w/Device KIT 1 each by Does not apply route 3 (three) times daily. 1 kit 0   cyclobenzaprine (FLEXERIL) 10 MG tablet Take 1 tablet (10 mg total) by mouth 3 (three) times daily as needed for muscle spasms. 30 tablet 0   glucose blood (ACCU-CHEK GUIDE) test strip Check sugars threes times daily 200 each 3   Insulin Pen Needle (NOVOFINE PEN NEEDLE) 32G X 6 MM MISC 1 each by Does not apply route every 7 (seven) days. 50 each 3   lacosamide (VIMPAT) 200 MG TABS tablet Take 1.5 tablets (300 mg total) by mouth 2 (two) times daily. 180 tablet 2   levETIRAcetam (KEPPRA) 1000 MG tablet Take 1 tablet (1,000 mg total) by mouth in the morning, at noon, and at bedtime. 90 tablet 6   losartan-hydrochlorothiazide (HYZAAR) 100-25  MG tablet Take 1 tablet by mouth daily. 90 tablet 2   montelukast (SINGULAIR) 10 MG tablet Take 1 tablet (10 mg total) by mouth daily. as directed 90 tablet 2   pantoprazole (PROTONIX) 40 MG tablet Take 1 tablet (40 mg total) by mouth 2 (two) times daily. 180 tablet 1   Semaglutide, 1 MG/DOSE, (OZEMPIC, 1 MG/DOSE,) 4 MG/3ML SOPN Inject 1 mg into the skin once a week. 12 mL 3   TRESIBA FLEXTOUCH 100 UNIT/ML FlexTouch Pen Inject 26 Units into the skin daily. (Patient taking differently: Inject 30 Units into the skin daily.) 15 mL 2   clotrimazole-betamethasone (LOTRISONE) cream Apply topically 2 (two) times daily. 30 g 3   nystatin cream (MYCOSTATIN)      No facility-administered medications prior to visit.    Allergies  Allergen Reactions   Levofloxacin Other (See Comments)    Throat swelling   Lisinopril Hives   Nitrofurantoin Swelling    Throat swelling   Penicillins Anaphylaxis   Clarithromycin Other (See Comments)    seizures   Sulfamethoxazole-Trimethoprim     Causes seizures    Review of Systems CONSTITUTIONAL: Negative for chills, fatigue, fever,  CV - negative Pulm - negative INTEGUMENTARY: see HPI         Objective:    PHYSICAL EXAM:   VS: BP 124/70   Pulse 88   Temp 98.8 F (37.1 C)   Resp 16   Ht '5\' 4"'  (1.626 m)   Wt 249 lb (112.9 kg)   LMP  (LMP Unknown)   SpO2 97%   BMI 42.74 kg/m   GEN: Well nourished, well developed, in no acute distress   Cardiac: RRR; no murmurs, Respiratory:  normal respiratory rate and pattern with no distress - normal breath sounds with no rales, rhonchi, wheezes or rubs  Skin: extreme intertrigo noted in skin folds under abdomen extending to buttocks     Health Maintenance Due  Topic Date Due   OPHTHALMOLOGY EXAM  Never done   Hepatitis C Screening  Never done   PAP SMEAR-Modifier  Never done   Zoster Vaccines- Shingrix (1 of 2) Never done   COVID-19 Vaccine (5 - Booster for Moderna series) 01/03/2021    There  are no preventive care reminders to display for this patient.   Lab Results  Component Value Date   TSH 1.170 06/05/2021   Lab Results  Component Value Date   WBC 13.3 (H) 06/05/2021  HGB 9.1 (L) 06/05/2021   HCT 30.0 (L) 06/05/2021   MCV 78 (L) 06/05/2021   PLT 720 (H) 06/05/2021   Lab Results  Component Value Date   NA 134 06/11/2021   K 5.3 (H) 06/11/2021   CO2 23 06/11/2021   GLUCOSE 272 (H) 06/11/2021   BUN 19 06/11/2021   CREATININE 0.88 06/11/2021   BILITOT 0.2 06/11/2021   ALKPHOS 100 06/11/2021   AST 13 06/11/2021   ALT 13 06/11/2021   PROT 6.4 06/11/2021   ALBUMIN 4.0 06/11/2021   CALCIUM 9.7 06/11/2021   EGFR 73 06/11/2021   Lab Results  Component Value Date   CHOL 135 06/05/2021   Lab Results  Component Value Date   HDL 49 06/05/2021   Lab Results  Component Value Date   LDLCALC 67 06/05/2021   Lab Results  Component Value Date   TRIG 106 06/05/2021   Lab Results  Component Value Date   CHOLHDL 2.8 06/05/2021   Lab Results  Component Value Date   HGBA1C 8.9 (H) 06/05/2021       Assessment & Plan:   Problem List Items Addressed This Visit   None Visit Diagnoses     Tinea corporis    -  Primary   Relevant Medications   clotrimazole-betamethasone (LOTRISONE) cream   terbinafine (LAMISIL) 250 MG tablet      Meds ordered this encounter  Medications   clotrimazole-betamethasone (LOTRISONE) cream    Sig: Apply 1 application. topically daily.    Dispense:  30 g    Refill:  0    Order Specific Question:   Supervising Provider    Answer:   Shelton Silvas   terbinafine (LAMISIL) 250 MG tablet    Sig: Take 1 tablet (250 mg total) by mouth daily.    Dispense:  30 tablet    Refill:  0    Order Specific Question:   Supervising Provider    Answer:   Shelton Silvas    No orders of the defined types were placed in this encounter.    Follow-up: Return if symptoms worsen or fail to improve.  An After Visit Summary was  printed and given to the patient.  Yetta Flock [facility] [phone]

## 2021-10-20 ENCOUNTER — Telehealth: Payer: Self-pay

## 2021-10-20 NOTE — Progress Notes (Signed)
Chronic Care Management Pharmacy Assistant   Name: Alicia Kelly  MRN: [id number] DOB: 03-Oct-1955   Reason for Encounter: Disease State call for DM   Recent office visits:  10/10/21 Alicia Duncans PA-C. Seen for Tinea Corporis. Started on Terbinafine HCI 28m daily.   Recent consult visits:  None  Hospital visits:  None  Medications: Outpatient Encounter Medications as of 10/20/2021  Medication Sig   Accu-Chek Softclix Lancets lancets TEST BLOOD SUGAR THREE TIMES DAILY   atorvastatin (LIPITOR) 40 MG tablet Take 1 tablet (40 mg total) by mouth daily.   B-D UF III MINI PEN NEEDLES 31G X 5 MM MISC USE 1 PEN NEEDLE DAILY   Blood Glucose Calibration (ACCU-CHEK AVIVA) SOLN    Blood Glucose Monitoring Suppl (ACCU-CHEK AVIVA PLUS) w/Device KIT 1 each by Does not apply route 3 (three) times daily.   clotrimazole-betamethasone (LOTRISONE) cream Apply 1 application. topically daily.   cyclobenzaprine (FLEXERIL) 10 MG tablet Take 1 tablet (10 mg total) by mouth 3 (three) times daily as needed for muscle spasms.   glucose blood (ACCU-CHEK GUIDE) test strip Check sugars threes times daily   Insulin Pen Needle (NOVOFINE PEN NEEDLE) 32G X 6 MM MISC 1 each by Does not apply route every 7 (seven) days.   lacosamide (VIMPAT) 200 MG TABS tablet Take 1.5 tablets (300 mg total) by mouth 2 (two) times daily.   levETIRAcetam (KEPPRA) 1000 MG tablet Take 1 tablet (1,000 mg total) by mouth in the morning, at noon, and at bedtime.   losartan-hydrochlorothiazide (HYZAAR) 100-25 MG tablet Take 1 tablet by mouth daily.   montelukast (SINGULAIR) 10 MG tablet Take 1 tablet (10 mg total) by mouth daily. as directed   pantoprazole (PROTONIX) 40 MG tablet Take 1 tablet (40 mg total) by mouth 2 (two) times daily.   Semaglutide, 1 MG/DOSE, (OZEMPIC, 1 MG/DOSE,) 4 MG/3ML SOPN Inject 1 mg into the skin once a week.   terbinafine (LAMISIL) 250 MG tablet Take 1 tablet (250 mg total) by mouth daily.   TRESIBA FLEXTOUCH  100 UNIT/ML FlexTouch Pen Inject 26 Units into the skin daily. (Patient taking differently: Inject 30 Units into the skin daily.)   No facility-administered encounter medications on file as of 10/20/2021.    Recent Relevant Labs: Lab Results  Component Value Date/Time   HGBA1C 8.9 (H) 06/05/2021 09:17 AM   HGBA1C 9.0 (H) 04/23/2021 11:35 AM   MICROALBUR 10 07/03/2020 10:25 AM    Kidney Function Lab Results  Component Value Date/Time   CREATININE 0.88 06/11/2021 09:46 AM   CREATININE 0.95 06/05/2021 09:17 AM   GFRNONAA 82 07/03/2020 11:07 AM   GFRAA 95 07/03/2020 11:07 AM     Current antihyperglycemic regimen:  tresiba flextouch 30 units daily  Ozempic 153mweek  Patient verbally confirms she is taking the above medications as directed. Yes  What recent interventions/DTPs have been made to improve glycemic control:  No recent changes   Have there been any recent hospitalizations or ED visits since last visit with CPP? No  Patient denies hypoglycemic symptoms, including None  Patient denies hyperglycemic symptoms, including none  How often are you checking your blood sugar? 3-4 times daily  What are your blood sugars ranging? Pt could not remember the rest of her readings. She did no write them down.  Fasting: 10/20/21 162  On insulin? Yes How many units:30 units   During the week, how often does your blood glucose drop below 70? Never  Are you checking your feet  daily/regularly? Yes  Adherence Review: Is the patient currently on a STATIN medication? Yes Is the patient currently on ACE/ARB medication? Yes Does the patient have >5 day gap between last estimated fill dates? CPP to review  Care Gaps: Last eye exam / Retinopathy Screening? Never done  Last Annual Wellness Visit? None noted  Last Diabetic Foot Exam? 06/05/21   Star Rating Drugs:  Medication:  Last Fill: Day Supply Semaglutide   10/06/21 30ds    09/06/21 [place], [place]  Pharmacist Assistant  [phone]

## 2021-10-29 ENCOUNTER — Other Ambulatory Visit: Payer: Self-pay

## 2021-10-29 DIAGNOSIS — E669 Obesity, unspecified: Secondary | ICD-10-CM

## 2021-10-29 MED ORDER — ACCU-CHEK GUIDE VI STRP: ORAL_STRIP | 3 refills | Status: DC

## 2021-10-29 NOTE — Telephone Encounter (Signed)
Patient has no f/u with PCP. Will ask team to get one scheduled

## 2021-11-05 DIAGNOSIS — J329 Chronic sinusitis, unspecified: Secondary | ICD-10-CM | POA: Diagnosis not present

## 2021-11-05 DIAGNOSIS — J309 Allergic rhinitis, unspecified: Secondary | ICD-10-CM | POA: Diagnosis not present

## 2021-11-05 DIAGNOSIS — I10 Essential (primary) hypertension: Secondary | ICD-10-CM | POA: Diagnosis not present

## 2021-11-05 DIAGNOSIS — G4730 Sleep apnea, unspecified: Secondary | ICD-10-CM | POA: Diagnosis not present

## 2021-11-05 DIAGNOSIS — E119 Type 2 diabetes mellitus without complications: Secondary | ICD-10-CM | POA: Diagnosis not present

## 2021-11-17 DIAGNOSIS — R110 Nausea: Secondary | ICD-10-CM | POA: Diagnosis not present

## 2021-11-17 DIAGNOSIS — R197 Diarrhea, unspecified: Secondary | ICD-10-CM | POA: Diagnosis not present

## 2021-11-17 DIAGNOSIS — I10 Essential (primary) hypertension: Secondary | ICD-10-CM | POA: Diagnosis not present

## 2021-11-17 DIAGNOSIS — R1084 Generalized abdominal pain: Secondary | ICD-10-CM | POA: Diagnosis not present

## 2021-11-17 DIAGNOSIS — R4020 Unspecified coma: Secondary | ICD-10-CM | POA: Diagnosis not present

## 2021-11-17 DIAGNOSIS — R55 Syncope and collapse: Secondary | ICD-10-CM | POA: Diagnosis not present

## 2021-11-17 DIAGNOSIS — R42 Dizziness and giddiness: Secondary | ICD-10-CM | POA: Diagnosis not present

## 2021-11-21 ENCOUNTER — Telehealth: Payer: Self-pay

## 2021-11-21 NOTE — Progress Notes (Signed)
Chronic Care Management Pharmacy Assistant   Name: Alicia Kelly  MRN: [id number] DOB: Mar 11, 1956   Reason for Encounter: Disease State call for DM    Recent office visits:  None  Recent consult visits:  None  Hospital visits:  None  Medications: Outpatient Encounter Medications as of 11/21/2021  Medication Sig   Accu-Chek Softclix Lancets lancets TEST BLOOD SUGAR THREE TIMES DAILY   atorvastatin (LIPITOR) 40 MG tablet Take 1 tablet (40 mg total) by mouth daily.   B-D UF III MINI PEN NEEDLES 31G X 5 MM MISC USE 1 PEN NEEDLE DAILY   Blood Glucose Calibration (ACCU-CHEK AVIVA) SOLN    Blood Glucose Monitoring Suppl (ACCU-CHEK AVIVA PLUS) w/Device KIT 1 each by Does not apply route 3 (three) times daily.   clotrimazole-betamethasone (LOTRISONE) cream Apply 1 application. topically daily.   cyclobenzaprine (FLEXERIL) 10 MG tablet Take 1 tablet (10 mg total) by mouth 3 (three) times daily as needed for muscle spasms.   glucose blood (ACCU-CHEK GUIDE) test strip Check sugars threes times daily   Insulin Pen Needle (NOVOFINE PEN NEEDLE) 32G X 6 MM MISC 1 each by Does not apply route every 7 (seven) days.   lacosamide (VIMPAT) 200 MG TABS tablet Take 1.5 tablets (300 mg total) by mouth 2 (two) times daily.   levETIRAcetam (KEPPRA) 1000 MG tablet Take 1 tablet (1,000 mg total) by mouth in the morning, at noon, and at bedtime.   losartan-hydrochlorothiazide (HYZAAR) 100-25 MG tablet Take 1 tablet by mouth daily.   montelukast (SINGULAIR) 10 MG tablet Take 1 tablet (10 mg total) by mouth daily. as directed   pantoprazole (PROTONIX) 40 MG tablet Take 1 tablet (40 mg total) by mouth 2 (two) times daily.   Semaglutide, 1 MG/DOSE, (OZEMPIC, 1 MG/DOSE,) 4 MG/3ML SOPN Inject 1 mg into the skin once a week.   terbinafine (LAMISIL) 250 MG tablet Take 1 tablet (250 mg total) by mouth daily.   TRESIBA FLEXTOUCH 100 UNIT/ML FlexTouch Pen Inject 26 Units into the skin daily. (Patient taking  differently: Inject 30 Units into the skin daily.)   No facility-administered encounter medications on file as of 11/21/2021.    Recent Relevant Labs: Lab Results  Component Value Date/Time   HGBA1C 8.9 (H) 06/05/2021 09:17 AM   HGBA1C 9.0 (H) 04/23/2021 11:35 AM   MICROALBUR 10 07/03/2020 10:25 AM    Kidney Function Lab Results  Component Value Date/Time   CREATININE 0.88 06/11/2021 09:46 AM   CREATININE 0.95 06/05/2021 09:17 AM   GFRNONAA 82 07/03/2020 11:07 AM   GFRAA 95 07/03/2020 11:07 AM     Current antihyperglycemic regimen:  tresiba flextouch 30 units daily  Ozempic 39m/week   Notes: Pt stated when she puts the needles on her TTyler Aasthat when she injects it the medicine runs out of the syringe and not all of it gets in her body. I advised for her to call Dr. CTobie Poetand schedule a nurse visit to make sure they are using the injection properly and have them check it for her. Pt understood and will call them Monday.   Patient verbally confirms she is taking the above medications as directed. Yes  What recent interventions/DTPs have been made to improve glycemic control:  Pt stated no changes   Have there been any recent hospitalizations or ED visits since last visit with CPP? Yes, [facility] as [facility]   Patient  denies hypoglycemic symptoms, including None  Patient denies hyperglycemic symptoms, including none  How often are you checking your blood sugar? before dinner  What are your blood sugars ranging?  11/20/21 before meals 131, 137  On insulin? Yes How many units:30 units   During the week, how often does your blood glucose drop below 70? Never  Are you checking your feet daily/regularly? Yes  Adherence Review: Is the patient currently on a STATIN medication? Yes Is the patient currently on ACE/ARB medication? Yes Does the patient have >5 day gap between last estimated fill dates? CPP  to review  Care Gaps: Last eye exam / Retinopathy Screening? Never done  Last Annual Wellness Visit? None noted  Last Diabetic Foot Exam? 06/05/21  Star Rating Drugs:  Medication:  Last Fill: Day Supply Semaglutide             10/29/21 - 10/06/21   [place], [place] Pharmacist Assistant  [phone]

## 2021-11-26 ENCOUNTER — Telehealth: Payer: Commercial Managed Care - HMO

## 2021-12-23 ENCOUNTER — Other Ambulatory Visit: Payer: Self-pay

## 2021-12-23 ENCOUNTER — Telehealth: Payer: Self-pay

## 2021-12-23 DIAGNOSIS — E669 Obesity, unspecified: Secondary | ICD-10-CM

## 2021-12-23 NOTE — Telephone Encounter (Signed)
Per Dr Henrene Pastor, she can establish with Dr Henrene Pastor.

## 2021-12-23 NOTE — Telephone Encounter (Signed)
Patient called back stating that she wants to know if Dr. Henrene Pastor is going to take her back as a patient or not. Patient stated this time that she did see the provider at [facility] but really wants to come back to Dr. Henrene Pastor. Patient asked that I call her back as soon as I know.

## 2021-12-23 NOTE — Telephone Encounter (Signed)
Alicia Kelly called this afternoon stating that she wants to continue her health care with our office. She did not want to transfer to [facility]. The patient was unable to tell me if she was seen as a new patient at the facility or not. Patient stated that she said told [facility] she would only transfer to them if Dr. Henrene Pastor left and as of right now he's still working.  I called [facility] and spoke with the medical records department. Pt was seen by Alvis Lemmings, [place] on November 05, 2021. The appointment was not coded as a new patient but was coded for a sinus infection. This office is a primary care office.  With that being said Dr. Henrene Pastor is it ok for her to come back as a new patient to re-establish with you or with one of the other providers that are taking new pts?

## 2021-12-24 ENCOUNTER — Other Ambulatory Visit: Payer: Self-pay

## 2021-12-24 DIAGNOSIS — I152 Hypertension secondary to endocrine disorders: Secondary | ICD-10-CM

## 2021-12-24 MED ORDER — TRESIBA FLEXTOUCH 100 UNIT/ML ~~LOC~~ SOPN: 30.0000 [IU] | PEN_INJECTOR | Freq: Every day | SUBCUTANEOUS | 0 refills | Status: DC

## 2021-12-30 DIAGNOSIS — R21 Rash and other nonspecific skin eruption: Secondary | ICD-10-CM | POA: Diagnosis not present

## 2021-12-30 DIAGNOSIS — K219 Gastro-esophageal reflux disease without esophagitis: Secondary | ICD-10-CM | POA: Diagnosis not present

## 2021-12-30 DIAGNOSIS — E119 Type 2 diabetes mellitus without complications: Secondary | ICD-10-CM | POA: Diagnosis not present

## 2022-01-01 ENCOUNTER — Other Ambulatory Visit: Payer: Self-pay

## 2022-01-01 DIAGNOSIS — E1159 Type 2 diabetes mellitus with other circulatory complications: Secondary | ICD-10-CM

## 2022-01-01 MED ORDER — OZEMPIC (1 MG/DOSE) 4 MG/3ML ~~LOC~~ SOPN: 1.0000 mg | PEN_INJECTOR | SUBCUTANEOUS | 0 refills | Status: DC

## 2022-01-15 ENCOUNTER — Other Ambulatory Visit: Payer: Self-pay

## 2022-01-15 DIAGNOSIS — E669 Obesity, unspecified: Secondary | ICD-10-CM

## 2022-01-15 MED ORDER — TRESIBA FLEXTOUCH 100 UNIT/ML ~~LOC~~ SOPN: 30.0000 [IU] | PEN_INJECTOR | Freq: Every day | SUBCUTANEOUS | 0 refills | Status: DC

## 2022-01-23 DIAGNOSIS — R300 Dysuria: Secondary | ICD-10-CM | POA: Diagnosis not present

## 2022-01-23 DIAGNOSIS — B372 Candidiasis of skin and nail: Secondary | ICD-10-CM | POA: Diagnosis not present

## 2022-01-28 ENCOUNTER — Encounter: Payer: Self-pay | Admitting: Legal Medicine

## 2022-01-28 ENCOUNTER — Ambulatory Visit (INDEPENDENT_AMBULATORY_CARE_PROVIDER_SITE_OTHER): Payer: Medicare Other | Admitting: Legal Medicine

## 2022-01-28 VITALS — BP 118/80 | HR 80 | Temp 97.2°F | Ht 64.0 in | Wt 249.0 lb

## 2022-01-28 DIAGNOSIS — N3000 Acute cystitis without hematuria: Secondary | ICD-10-CM

## 2022-01-28 DIAGNOSIS — Z1159 Encounter for screening for other viral diseases: Secondary | ICD-10-CM

## 2022-01-28 DIAGNOSIS — E1159 Type 2 diabetes mellitus with other circulatory complications: Secondary | ICD-10-CM | POA: Diagnosis not present

## 2022-01-28 DIAGNOSIS — E782 Mixed hyperlipidemia: Secondary | ICD-10-CM | POA: Diagnosis not present

## 2022-01-28 DIAGNOSIS — E1169 Type 2 diabetes mellitus with other specified complication: Secondary | ICD-10-CM | POA: Diagnosis not present

## 2022-01-28 DIAGNOSIS — I10 Essential (primary) hypertension: Secondary | ICD-10-CM | POA: Diagnosis not present

## 2022-01-28 DIAGNOSIS — Z6841 Body Mass Index (BMI) 40.0 and over, adult: Secondary | ICD-10-CM

## 2022-01-28 DIAGNOSIS — B354 Tinea corporis: Secondary | ICD-10-CM | POA: Diagnosis not present

## 2022-01-28 DIAGNOSIS — R300 Dysuria: Secondary | ICD-10-CM

## 2022-01-28 DIAGNOSIS — I152 Hypertension secondary to endocrine disorders: Secondary | ICD-10-CM

## 2022-01-28 DIAGNOSIS — G40209 Localization-related (focal) (partial) symptomatic epilepsy and epileptic syndromes with complex partial seizures, not intractable, without status epilepticus: Secondary | ICD-10-CM

## 2022-01-28 DIAGNOSIS — E669 Obesity, unspecified: Secondary | ICD-10-CM

## 2022-01-28 LAB — POCT URINALYSIS DIP (CLINITEK)
Bilirubin, UA: NEGATIVE
Blood, UA: NEGATIVE
Glucose, UA: NEGATIVE mg/dL
Ketones, POC UA: NEGATIVE mg/dL
Nitrite, UA: NEGATIVE
POC PROTEIN,UA: NEGATIVE
Spec Grav, UA: <=1.005 — AB (ref 1.010–1.025)
Urobilinogen, UA: 0.2 E.U./dL
pH, UA: 7 (ref 5.0–8.0)

## 2022-01-28 MED ORDER — DOXYCYCLINE HYCLATE 100 MG PO TABS: 100.0000 mg | ORAL_TABLET | Freq: Two times a day (BID) | ORAL | 0 refills | Status: DC

## 2022-01-28 MED ORDER — CLOTRIMAZOLE-BETAMETHASONE 1-0.05 % EX CREA: 1.0000 | TOPICAL_CREAM | Freq: Every day | CUTANEOUS | 3 refills | Status: DC

## 2022-01-28 NOTE — Progress Notes (Signed)
Trace leukocytes lp

## 2022-01-28 NOTE — Progress Notes (Signed)
Acute Office Visit  Subjective:    Patient ID: Alicia Kelly, female    DOB: 16-Mar-1956, 66 y.o.   MRN: [id number]  Chief Complaint  Patient presents with   Urinary Tract Infection    HPI: Patient is here today with complain of burning when urinate and pain in lower back.. She went to UC and put on sulfa drug that caused hot flushes.  She is allergic to it.  Still having dysuria with 1+ leukocytes.  She has more seizures on sulfa drug.  She has intertrigo and small ulcer on buttocks, successfully treated by lotrisone.  Patient present with type 2 diabetes.  Specifically, this is type 2, insulin requiring diabetes, complicated by seizures and hyperlipidemia..  Compliance with treatment has been good; patient take medicines as directed, maintains diet and exercise regimen, follows up as directed, and is keeping glucose diary.  Date of  diagnosis 20.  Depression screen has been performed.Tobacco screen nonsmoker. Current medicines for diabetes tresiba 30m a day and ozemic 170mqd..  Patient is on losartan for renal protection and atorvastatin for cholesterol control.  Patient performs foot exams daily and last ophthalmologic exam was eyes check end of last year  Last A1c 8.9.  Patient presents with hyperlipidemia.  Compliance with treatment has been good; patient takes medicines as directed, maintains low cholesterol diet, follows up as directed, and maintains exercise regimen.  Patient is using atorvastatin without problems.   Past Medical History:  Diagnosis Date   Asthma    Calculus of gallbladder with chronic cholecystitis without obstruction 03/22/2017   [place]   [place]ile[place] ([place]   Hypertension    Sleep apnea     Past Surgical History:  Procedure Laterality Date   APPENDECTOMY     TONSILLECTOMY     TONSILLECTOMY      Family History  Problem Relation Age of Onset   Colon cancer Mother        d. [id number] Hypertension Father    Diabetes Father    Allergic rhinitis  Sister    Hypertension Sister    Colon cancer Maternal Grandmother        dx 6055s Colon cancer Paternal Grandmother        dx 6034s Diabetes Paternal Grandmother    Cancer Maternal Uncle    Breast cancer Neg Hx     Social History   Socioeconomic History   Marital status: Married    Spouse name: Not on file   Number of children: Not on file   Years of education: Not on file   Highest education level: Not on file  Occupational History   Not on file  Tobacco Use   Smoking status: Never   Smokeless tobacco: Never  Vaping Use   Vaping Use: Never used  Substance and Sexual Activity   Alcohol use: Not Currently   Drug use: Never   Sexual activity: Not on file  Other Topics Concern   Not on file  Social History Narrative   Not on file   Social Determinants of Health   Financial Resource Strain: Low Risk  (09/10/2021)   Overall Financial Resource Strain (CARDIA)    Difficulty of Paying Living Expenses: Not hard at all  Food Insecurity: No Food Insecurity (08/22/2020)   Hunger Vital Sign    Worried About Running Out of Food in the Last Year: Never true    Ran Out of Food in the Last Year: Never true  Transportation Needs: No Transportation Needs (09/10/2021)   PRAPARE - Hydrologist (Medical): No    Lack of Transportation (Non-Medical): No  Physical Activity: Insufficiently Active (11/04/2020)   Exercise Vital Sign    Days of Exercise per Week: 2 days    Minutes of Exercise per Session: 20 min  Stress: No Stress Concern Present (11/04/2020)   [place]    Feeling of Stress : Not at all  Social Connections: Moderately Isolated (11/04/2020)   Social Connection and Isolation Panel [NHANES]    Frequency of Communication with Friends and Family: More than three times a week    Frequency of Social Gatherings with Friends and Family: Never    Attends Religious Services: Never    Building surveyor or Organizations: No    Attends Archivist Meetings: Never    Marital Status: Married  Human resources officer Violence: Not At Risk (11/04/2020)   Humiliation, Afraid, Rape, and Kick questionnaire    Fear of Current or Ex-Partner: No    Emotionally Abused: No    Physically Abused: No    Sexually Abused: No    Outpatient Medications Prior to Visit  Medication Sig Dispense Refill   Accu-Chek Softclix Lancets lancets TEST BLOOD SUGAR THREE TIMES DAILY 300 each 1   atorvastatin (LIPITOR) 40 MG tablet Take 1 tablet (40 mg total) by mouth daily. 90 tablet 2   B-D UF III MINI PEN NEEDLES 31G X 5 MM MISC USE 1 PEN NEEDLE DAILY 100 each 2   Blood Glucose Calibration (ACCU-CHEK AVIVA) SOLN      Blood Glucose Monitoring Suppl (ACCU-CHEK AVIVA PLUS) w/Device KIT 1 each by Does not apply route 3 (three) times daily. 1 kit 0   cyclobenzaprine (FLEXERIL) 10 MG tablet Take 1 tablet (10 mg total) by mouth 3 (three) times daily as needed for muscle spasms. 30 tablet 0   glucose blood (ACCU-CHEK GUIDE) test strip Check sugars threes times daily 300 each 3   Insulin Pen Needle (NOVOFINE PEN NEEDLE) 32G X 6 MM MISC 1 each by Does not apply route every 7 (seven) days. 50 each 3   lacosamide (VIMPAT) 200 MG TABS tablet Take 1.5 tablets (300 mg total) by mouth 2 (two) times daily. 180 tablet 2   levETIRAcetam (KEPPRA) 1000 MG tablet Take 1 tablet (1,000 mg total) by mouth in the morning, at noon, and at bedtime. 90 tablet 6   losartan-hydrochlorothiazide (HYZAAR) 100-25 MG tablet Take 1 tablet by mouth daily. 90 tablet 2   montelukast (SINGULAIR) 10 MG tablet Take 1 tablet (10 mg total) by mouth daily. as directed 90 tablet 2   pantoprazole (PROTONIX) 40 MG tablet Take 1 tablet (40 mg total) by mouth 2 (two) times daily. 180 tablet 1   Semaglutide, 1 MG/DOSE, (OZEMPIC, 1 MG/DOSE,) 4 MG/3ML SOPN Inject 1 mg into the skin once a week. 3 mL 0   terbinafine (LAMISIL) 250 MG tablet Take 1 tablet  (250 mg total) by mouth daily. 30 tablet 0   TRESIBA FLEXTOUCH 100 UNIT/ML FlexTouch Pen Inject 30 Units into the skin daily. 3 mL 0   clotrimazole-betamethasone (LOTRISONE) cream Apply 1 application. topically daily. 30 g 0   No facility-administered medications prior to visit.    Allergies  Allergen Reactions   Levofloxacin Other (See Comments)    Throat swelling   Lisinopril Hives   Nitrofurantoin Swelling    Throat swelling  Penicillins Anaphylaxis   Clarithromycin Other (See Comments)    seizures   Sulfamethoxazole-Trimethoprim     Causes seizures    Review of Systems  Constitutional:  Negative for fatigue.  HENT:  Negative for congestion, ear pain and sore throat.   Respiratory:  Negative for cough and shortness of breath.   Cardiovascular:  Negative for chest pain.  Gastrointestinal:  Negative for abdominal pain, constipation, diarrhea, nausea and vomiting.  Genitourinary:  Negative for dysuria, frequency and urgency.  Musculoskeletal:  Positive for back pain (Pain in lower back.). Negative for arthralgias and myalgias.  Neurological:  Negative for dizziness and headaches.  Psychiatric/Behavioral:  Negative for agitation and sleep disturbance. The patient is not nervous/anxious.        Objective:    Physical Exam Vitals reviewed.  Constitutional:      Appearance: Normal appearance. She is obese.  HENT:     Head: Normocephalic and atraumatic.     Right Ear: Tympanic membrane normal.     Left Ear: Tympanic membrane normal.  Cardiovascular:     Rate and Rhythm: Normal rate and regular rhythm.     Pulses: Normal pulses.     Heart sounds: Normal heart sounds. No murmur heard.    No gallop.  Pulmonary:     Effort: Pulmonary effort is normal. No respiratory distress.     Breath sounds: Normal breath sounds. No wheezing.  Musculoskeletal:     Cervical back: Normal range of motion and neck supple.  Neurological:     Mental Status: She is alert.     BP 118/80  (BP Location: Right Arm, Patient Position: Sitting, Cuff Size: Large)   Pulse 80   Temp (!) 97.2 F (36.2 C) (Temporal)   Ht 5' 4" (1.626 m)   Wt 249 lb (112.9 kg)   LMP  (LMP Unknown)   SpO2 96%   BMI 42.74 kg/m  Wt Readings from Last 3 Encounters:  01/28/22 249 lb (112.9 kg)  10/10/21 249 lb (112.9 kg)  06/05/21 248 lb 12.8 oz (112.9 kg)    Health Maintenance Due  Topic Date Due   OPHTHALMOLOGY EXAM  Never done   Zoster Vaccines- Shingrix (1 of 2) Never done   PAP SMEAR-Modifier  Never done   COVID-19 Vaccine (5 - Moderna risk series) 01/03/2021   Pneumonia Vaccine 83+ Years old (2 - PCV) 11/24/2021   DEXA SCAN  Never done   INFLUENZA VACCINE  12/16/2021    There are no preventive care reminders to display for this patient.   Lab Results  Component Value Date   TSH 1.170 06/05/2021   Lab Results  Component Value Date   WBC 11.5 (H) 01/28/2022   HGB 11.8 01/28/2022   HCT 35.9 01/28/2022   MCV 91 01/28/2022   PLT 510 (H) 01/28/2022   Lab Results  Component Value Date   NA 140 01/28/2022   K 4.3 01/28/2022   CO2 23 01/28/2022   GLUCOSE 170 (H) 01/28/2022   BUN 13 01/28/2022   CREATININE 0.87 01/28/2022   BILITOT 0.2 01/28/2022   ALKPHOS 77 01/28/2022   AST 18 01/28/2022   ALT 19 01/28/2022   PROT 6.7 01/28/2022   ALBUMIN 4.3 01/28/2022   CALCIUM 10.2 01/28/2022   EGFR 74 01/28/2022   Lab Results  Component Value Date   CHOL 146 01/28/2022   Lab Results  Component Value Date   HDL 52 01/28/2022   Lab Results  Component Value Date   LDLCALC 67  01/28/2022   Lab Results  Component Value Date   TRIG 157 (H) 01/28/2022   Lab Results  Component Value Date   CHOLHDL 2.8 01/28/2022   Lab Results  Component Value Date   HGBA1C 7.5 (H) 01/28/2022       Assessment & Plan:   Problem List Items Addressed This Visit       Cardiovascular and Mediastinum   Essential hypertension   Relevant Orders   CBC with Differential/Platelet (Completed)    Comprehensive metabolic panel (Completed) An individual hypertension care plan was established and reinforced today.  The patient's status was assessed using clinical findings on exam and labs or diagnostic tests. The patient's success at meeting treatment goals on disease specific evidence-based guidelines and found to be well controlled. SELF MANAGEMENT: The patient and I together assessed ways to personally work towards obtaining the recommended goals. RECOMMENDATIONS: avoid decongestants found in common cold remedies, decrease consumption of alcohol, perform routine monitoring of BP with home BP cuff, exercise, reduction of dietary salt, take medicines as prescribed, try not to miss doses and quit smoking.  Regular exercise and maintaining a healthy weight is needed.  Stress reduction may help. A CLINICAL SUMMARY including written plan identify barriers to care unique to individual due to social or financial issues.  We attempt to mutually creat solutions for individual and family understanding.     Obesity, diabetes, and hypertension syndrome ([place])   Relevant Orders   Hemoglobin A1c (Completed) An individual care plan for diabetes was established and reinforced today.  The patient's status was assessed using clinical findings on exam, labs and diagnostic testing. Patient success at meeting goals based on disease specific evidence-based guidelines and found to be good controlled. Medications were assessed and patient's understanding of the medical issues , including barriers were assessed. Recommend adherence to a diabetic diet, a graduated exercise program, HgbA1c level is checked quarterly, and urine microalbumin performed yearly .  Annual mono-filament sensation testing performed. Lower blood pressure and control hyperlipidemia is important. Get annual eye exams and annual flu shots and smoking cessation discussed.  Self management goals were discussed.      Nervous and Auditory   Complex  partial seizures (HCC)   Relevant Orders   Levetiracetam level (Completed) Patient is feeling "twitchy" since on 2 days of sulfa drugs, check level     Other   Hyperlipidemia   Relevant Orders   Lipid panel (Completed) AN INDIVIDUAL CARE PLAN for hyperlipidemia/ cholesterol was established and reinforced today.  The patient's status was assessed using clinical findings on exam, lab and other diagnostic tests. The patient's disease status was assessed based on evidence-based guidelines and found to be fair controlled. MEDICATIONS were reviewed. SELF MANAGEMENT GOALS have been discussed and patient's success at attaining the goal of low cholesterol was assessed. RECOMMENDATION given include regular exercise 3 days a week and low cholesterol/low fat diet. CLINICAL SUMMARY including written plan to identify barriers unique to the patient due to social or economic  reasons was discussed.     BMI 40.0-44.9, adult [facility]) An individualize plan was formulated for obesity using patient history and physical exam to encourage weight loss.  An evidence based program was formulated.  Patient is to cut portion size with meals and to plan physical exercise 3 days a week at least 20 minutes.  Weight watchers and other programs are helpful.  Planned amount of weight loss 10 lbs.     Morbid obesity ([place]) Patient has BMP > 40  so meets criteria for morbid obesity   Other Visit Diagnoses     Burning with urination    -  Primary   Relevant Orders   POCT URINALYSIS DIP (CLINITEK) (Completed) Check urinalysis    Acute cystitis without hematuria       Relevant Medications   doxycycline (VIBRA-TABS) 100 MG tablet   Other Relevant Orders   Urine Culture (Completed) Patient has lucosuria, treat with antibiotics, culture urine    Tinea corporis       Relevant Medications   clotrimazole-betamethasone (LOTRISONE) cream Patient has intertrigo on buttocks    Need for hepatitis C screening test       Relevant  Orders   Hepatitis C Antibody (Completed)      Meds ordered this encounter  Medications   doxycycline (VIBRA-TABS) 100 MG tablet    Sig: Take 1 tablet (100 mg total) by mouth 2 (two) times daily.    Dispense:  20 tablet    Refill:  0   clotrimazole-betamethasone (LOTRISONE) cream    Sig: Apply 1 Application topically daily.    Dispense:  30 g    Refill:  3    Orders Placed This Encounter  Procedures   Urine Culture   CBC with Differential/Platelet   Comprehensive metabolic panel   Hemoglobin A1c   Lipid panel   Levetiracetam level   Hepatitis C Antibody   Cardiovascular Risk Assessment   POCT URINALYSIS DIP (CLINITEK)     Follow-up: Return urine check in 2 weeks, chronic follow up 3 months.  An After Visit Summary was printed and given to the patient.  Reinaldo Meeker, MD [facility] [phone]

## 2022-01-29 LAB — URINE CULTURE

## 2022-01-29 LAB — COMPREHENSIVE METABOLIC PANEL
ALT: 19 IU/L (ref 0–32)
AST: 18 IU/L (ref 0–40)
Albumin/Globulin Ratio: 1.8 (ref 1.2–2.2)
Albumin: 4.3 g/dL (ref 3.9–4.9)
Alkaline Phosphatase: 77 IU/L (ref 44–121)
BUN/Creatinine Ratio: 15 (ref 12–28)
BUN: 13 mg/dL (ref 8–27)
Bilirubin Total: 0.2 mg/dL (ref 0.0–1.2)
CO2: 23 mmol/L (ref 20–29)
Calcium: 10.2 mg/dL (ref 8.7–10.3)
Chloride: 100 mmol/L (ref 96–106)
Creatinine, Ser: 0.87 mg/dL (ref 0.57–1.00)
Globulin, Total: 2.4 g/dL (ref 1.5–4.5)
Glucose: 170 mg/dL — ABNORMAL HIGH (ref 70–99)
Potassium: 4.3 mmol/L (ref 3.5–5.2)
Sodium: 140 mmol/L (ref 134–144)
Total Protein: 6.7 g/dL (ref 6.0–8.5)
eGFR: 74 mL/min/{1.73_m2} (ref >59)

## 2022-01-29 LAB — CBC WITH DIFFERENTIAL/PLATELET
Basophils Absolute: 0.1 10*3/uL (ref 0.0–0.2)
Basos: 1 %
EOS (ABSOLUTE): 0.3 10*3/uL (ref 0.0–0.4)
Eos: 2 %
Hematocrit: 35.9 % (ref 34.0–46.6)
Hemoglobin: 11.8 g/dL (ref 11.1–15.9)
Immature Grans (Abs): 0.1 10*3/uL (ref 0.0–0.1)
Immature Granulocytes: 1 %
Lymphocytes Absolute: 2.1 10*3/uL (ref 0.7–3.1)
Lymphs: 18 %
MCH: 29.9 pg (ref 26.6–33.0)
MCHC: 32.9 g/dL (ref 31.5–35.7)
MCV: 91 fL (ref 79–97)
Monocytes Absolute: 1 10*3/uL — ABNORMAL HIGH (ref 0.1–0.9)
Monocytes: 8 %
Neutrophils Absolute: 8 10*3/uL — ABNORMAL HIGH (ref 1.4–7.0)
Neutrophils: 70 %
Platelets: 510 10*3/uL — ABNORMAL HIGH (ref 150–450)
RBC: 3.95 x10E6/uL (ref 3.77–5.28)
RDW: 12.7 % (ref 11.7–15.4)
WBC: 11.5 10*3/uL — ABNORMAL HIGH (ref 3.4–10.8)

## 2022-01-29 LAB — LEVETIRACETAM LEVEL: Levetiracetam Lvl: 46.5 ug/mL — ABNORMAL HIGH (ref 10.0–40.0)

## 2022-01-29 LAB — CARDIOVASCULAR RISK ASSESSMENT

## 2022-01-29 LAB — HEPATITIS C ANTIBODY: Hep C Virus Ab: NONREACTIVE

## 2022-01-29 LAB — LIPID PANEL
Chol/HDL Ratio: 2.8 ratio (ref 0.0–4.4)
Cholesterol, Total: 146 mg/dL (ref 100–199)
HDL: 52 mg/dL (ref >39)
LDL Chol Calc (NIH): 67 mg/dL (ref 0–99)
Triglycerides: 157 mg/dL — ABNORMAL HIGH (ref 0–149)
VLDL Cholesterol Cal: 27 mg/dL (ref 5–40)

## 2022-01-29 LAB — HEMOGLOBIN A1C
Est. average glucose Bld gHb Est-mCnc: 169 mg/dL
Hgb A1c MFr Bld: 7.5 % — ABNORMAL HIGH (ref 4.8–5.6)

## 2022-01-29 NOTE — Progress Notes (Signed)
Chronic increase WBC, suggest referral to hematology for persistent high WBC, glucose 170, kidney and liver tests normal, A1c 7.5, triglycerides high 157, keppra level high at 46.5 may be due to sulfa drug but suggest repeat in one month, hepatitis c negative lp

## 2022-02-02 ENCOUNTER — Other Ambulatory Visit: Payer: Self-pay

## 2022-02-02 DIAGNOSIS — G40209 Localization-related (focal) (partial) symptomatic epilepsy and epileptic syndromes with complex partial seizures, not intractable, without status epilepticus: Secondary | ICD-10-CM

## 2022-02-02 DIAGNOSIS — D72829 Elevated white blood cell count, unspecified: Secondary | ICD-10-CM

## 2022-02-08 NOTE — Progress Notes (Signed)
[facility]  [address] [place],  [place]  [address] [phone]  Clinic Day:  02/09/2022  Referring physician: Lillard Anes,*   HISTORY OF PRESENT ILLNESS:  The patient is a 66 y.o. female  who I was asked to re-evaluate for leukocytosis.  She was seen by me for the same concern in June 2022; however, at that time, her white count came back normal at 9.7.  With respect to her recent leukocytosis, the patient claims she was recently found to have a yeast infection, for which she has been on therapy for the past week to treat.  Furthermore, she complains of a wound on her bottom that is yet to heal due to her suboptimally controlled diabetes.  She denies having any B symptoms or lymphadenopathy which concerns her for leukocytosis being due to to an underlying hematologic malignancy.  Outside of her nagging infections, she denies having any significant changes in her health.  PHYSICAL EXAM:  Blood pressure (!) 181/84, pulse 79, temperature 98 F (36.7 C), resp. rate 16, height '5\' 4"'$  (1.626 m), weight 249 lb 8 oz (113.2 kg), SpO2 97 %. Wt Readings from Last 3 Encounters:  02/09/22 249 lb 8 oz (113.2 kg)  01/28/22 249 lb (112.9 kg)  10/10/21 249 lb (112.9 kg)   Body mass index is 42.83 kg/m. Performance status (ECOG): 2 - Symptomatic, <50% confined to bed Physical Exam Constitutional:      Appearance: Normal appearance. She is not ill-appearing.  HENT:     Mouth/Throat:     Mouth: Mucous membranes are moist.     Pharynx: Oropharynx is clear. No oropharyngeal exudate or posterior oropharyngeal erythema.  Cardiovascular:     Rate and Rhythm: Normal rate and regular rhythm.     Heart sounds: No murmur heard.    No friction rub. No gallop.  Pulmonary:     Effort: Pulmonary effort is normal. No respiratory distress.     Breath sounds: Normal breath sounds. No wheezing, rhonchi or rales.  Abdominal:     General: Bowel sounds are normal.  There is no distension.     Palpations: Abdomen is soft. There is no mass.     Tenderness: There is no abdominal tenderness.  Musculoskeletal:        General: No swelling.     Right lower leg: No edema.     Left lower leg: No edema.  Lymphadenopathy:     Cervical: No cervical adenopathy.     Upper Body:     Right upper body: No supraclavicular or axillary adenopathy.     Left upper body: No supraclavicular or axillary adenopathy.     Lower Body: No right inguinal adenopathy. No left inguinal adenopathy.  Skin:    General: Skin is warm.     Coloration: Skin is not jaundiced.     Findings: No lesion or rash.  Neurological:     General: No focal deficit present.     Mental Status: She is alert and oriented to person, place, and time. Mental status is at baseline.  Psychiatric:        Mood and Affect: Mood normal.        Behavior: Behavior normal.        Thought Content: Thought content normal.    LABS:    Latest Reference Range & Units 02/09/22 11:56  Iron 28 - 170 ug/dL 52  UIBC ug/dL 349  TIBC 250 - 450 ug/dL 401  Saturation Ratios  10.4 - 31.8 % 13  Ferritin 11 - 307 ng/mL 12  Folate >5.9 ng/mL 6.9  Vitamin B12 180 - 914 pg/mL 216   ASSESSMENT & PLAN:  A 66 y.o. female who I was asked to reevaluate for leukocytosis. Her white count of 12.7 is only minimally elevated.  As mentioned previously, she is dealing with a yeast infection, as well as a poorly healing wound on her buttocks.  Such mild infections can lead to mild leukocytosis.  There is nothing per history or physical exam to suggest a more ominous disease process is present.  When evaluating her other peripheral counts, she has both anemia and thrombocythemia.  When factoring these results in with her suboptimal iron studies, I do believe a component of iron deficiency anemia is present.  Based upon this, I will arrange for her to receive IV iron over these next few weeks to replenish her iron stores and normalize her  hemoglobin/platelets.  I will see her back in 3 months for repeat clinical assessment.  The patient understands all the plans discussed today and is in agreement with them.   Saverio Kader Macarthur Critchley, MD

## 2022-02-09 ENCOUNTER — Other Ambulatory Visit: Payer: Self-pay | Admitting: Oncology

## 2022-02-09 ENCOUNTER — Telehealth: Payer: Self-pay

## 2022-02-09 ENCOUNTER — Inpatient Hospital Stay: Payer: Medicare Other | Attending: Oncology | Admitting: Oncology

## 2022-02-09 ENCOUNTER — Inpatient Hospital Stay: Payer: Medicare Other

## 2022-02-09 VITALS — BP 181/84 | HR 79 | Temp 98.0°F | Resp 16 | Ht 64.0 in | Wt 249.5 lb

## 2022-02-09 DIAGNOSIS — D75839 Thrombocytosis, unspecified: Secondary | ICD-10-CM | POA: Diagnosis not present

## 2022-02-09 DIAGNOSIS — D509 Iron deficiency anemia, unspecified: Secondary | ICD-10-CM | POA: Insufficient documentation

## 2022-02-09 DIAGNOSIS — D72829 Elevated white blood cell count, unspecified: Secondary | ICD-10-CM

## 2022-02-09 DIAGNOSIS — D469 Myelodysplastic syndrome, unspecified: Secondary | ICD-10-CM | POA: Diagnosis not present

## 2022-02-09 DIAGNOSIS — N3000 Acute cystitis without hematuria: Secondary | ICD-10-CM

## 2022-02-09 LAB — CBC AND DIFFERENTIAL
HCT: 34 — AB (ref 36–46)
Hemoglobin: 11.3 — AB (ref 12.0–16.0)
Neutrophils Absolute: 9.91
Platelets: 458 10*3/uL — AB (ref 150–400)
WBC: 12.7

## 2022-02-09 LAB — IRON AND TIBC
Iron: 52 ug/dL (ref 28–170)
Saturation Ratios: 13 % (ref 10.4–31.8)
TIBC: 401 ug/dL (ref 250–450)
UIBC: 349 ug/dL

## 2022-02-09 LAB — VITAMIN B12: Vitamin B-12: 216 pg/mL (ref 180–914)

## 2022-02-09 LAB — FOLATE: Folate: 6.9 ng/mL (ref >5.9)

## 2022-02-09 LAB — FERRITIN: Ferritin: 12 ng/mL (ref 11–307)

## 2022-02-09 LAB — CBC: RBC: 3.82 — AB (ref 3.87–5.11)

## 2022-02-09 NOTE — Telephone Encounter (Signed)
Dr Bobby Rumpf reviewed labs below. He recommends pt to have IV iron infusions. I notified pt of Dr Bobby Rumpf recommendation. I told her to expect a call from the schedulers in a couple of days, once insurance has approved to schedule appts. She verbalized understanding.   Latest Reference Range & Units 02/09/22 11:56  Iron 28 - 170 ug/dL 52  UIBC ug/dL 349  TIBC 250 - 450 ug/dL 401  Saturation Ratios 10.4 - 31.8 % 13  Ferritin 11 - 307 ng/mL 12  Folate >5.9 ng/mL 6.9  Vitamin B12 180 - 914 pg/mL 216

## 2022-02-10 ENCOUNTER — Telehealth: Payer: Self-pay | Admitting: Oncology

## 2022-02-10 NOTE — Telephone Encounter (Signed)
Contacted pt to schedule patient for a follow-up and IV Iron.Unable to reach via phone, voicemail box was full.  Scheduling Message Entered by Lavera Guise A on 02/09/2022 at  5:58 PM Priority: Routine <No visit type provided>  Department: [facility]  Provider:   Scheduling Notes:  Have pt see me back on 05-14-22 with lab appt.  Cancel her previous November appt

## 2022-02-11 ENCOUNTER — Ambulatory Visit (INDEPENDENT_AMBULATORY_CARE_PROVIDER_SITE_OTHER): Payer: Medicare Other | Admitting: Legal Medicine

## 2022-02-11 ENCOUNTER — Encounter: Payer: Self-pay | Admitting: Legal Medicine

## 2022-02-11 ENCOUNTER — Other Ambulatory Visit: Payer: Self-pay | Admitting: Pharmacist

## 2022-02-11 VITALS — BP 130/80 | HR 83 | Temp 97.7°F | Resp 15 | Ht 64.0 in | Wt 252.0 lb

## 2022-02-11 DIAGNOSIS — N3000 Acute cystitis without hematuria: Secondary | ICD-10-CM

## 2022-02-11 DIAGNOSIS — L8941 Pressure ulcer of contiguous site of back, buttock and hip, stage 1: Secondary | ICD-10-CM

## 2022-02-11 DIAGNOSIS — L8990 Pressure ulcer of unspecified site, unspecified stage: Secondary | ICD-10-CM | POA: Insufficient documentation

## 2022-02-11 LAB — POCT URINALYSIS DIP (CLINITEK)
Bilirubin, UA: NEGATIVE
Blood, UA: NEGATIVE
Glucose, UA: NEGATIVE mg/dL
Ketones, POC UA: NEGATIVE mg/dL
Leukocytes, UA: NEGATIVE
Nitrite, UA: NEGATIVE
POC PROTEIN,UA: NEGATIVE
Spec Grav, UA: 1.01 (ref 1.010–1.025)
Urobilinogen, UA: 0.2 E.U./dL
pH, UA: 6 (ref 5.0–8.0)

## 2022-02-11 NOTE — Progress Notes (Signed)
Subjective:  Patient ID: Alicia Kelly, female    DOB: October 17, 1955  Age: 66 y.o. MRN: [id number]  Chief Complaint  Patient presents with   Hypertension   Urinary Tract Infection    HPI  Patient is here to check urine after she finished doxycycline. She still has some dysuria and some white-yellowish vaginal discharge and she is concerned about yeast infection. Ulcer on abdomen still burning. Some redness.  Her buttock ulcer are healing.  Current Outpatient Medications on File Prior to Visit  Medication Sig Dispense Refill   Accu-Chek Softclix Lancets lancets TEST BLOOD SUGAR THREE TIMES DAILY 300 each 1   atorvastatin (LIPITOR) 40 MG tablet Take 1 tablet (40 mg total) by mouth daily. 90 tablet 2   B-D UF III MINI PEN NEEDLES 31G X 5 MM MISC USE 1 PEN NEEDLE DAILY 100 each 2   Blood Glucose Calibration (ACCU-CHEK AVIVA) SOLN      Blood Glucose Monitoring Suppl (ACCU-CHEK AVIVA PLUS) w/Device KIT 1 each by Does not apply route 3 (three) times daily. 1 kit 0   clotrimazole-betamethasone (LOTRISONE) cream Apply 1 Application topically daily. 30 g 3   cyclobenzaprine (FLEXERIL) 10 MG tablet Take 1 tablet (10 mg total) by mouth 3 (three) times daily as needed for muscle spasms. 30 tablet 0   fluticasone (FLONASE) 50 MCG/ACT nasal spray Place 2 sprays into both nostrils daily.     glucose blood (ACCU-CHEK GUIDE) test strip Check sugars threes times daily 300 each 3   Insulin Pen Needle (NOVOFINE PEN NEEDLE) 32G X 6 MM MISC 1 each by Does not apply route every 7 (seven) days. 50 each 3   lacosamide (VIMPAT) 200 MG TABS tablet Take 1.5 tablets (300 mg total) by mouth 2 (two) times daily. 180 tablet 2   levETIRAcetam (KEPPRA) 1000 MG tablet Take 1 tablet (1,000 mg total) by mouth in the morning, at noon, and at bedtime. 90 tablet 6   losartan-hydrochlorothiazide (HYZAAR) 100-25 MG tablet Take 1 tablet by mouth daily. 90 tablet 2   montelukast (SINGULAIR) 10 MG tablet Take 1 tablet (10 mg  total) by mouth daily. as directed 90 tablet 2   pantoprazole (PROTONIX) 40 MG tablet Take 1 tablet (40 mg total) by mouth 2 (two) times daily. 180 tablet 1   Semaglutide, 1 MG/DOSE, (OZEMPIC, 1 MG/DOSE,) 4 MG/3ML SOPN Inject 1 mg into the skin once a week. 3 mL 0   terbinafine (LAMISIL) 250 MG tablet Take 1 tablet (250 mg total) by mouth daily. 30 tablet 0   TRESIBA FLEXTOUCH 100 UNIT/ML FlexTouch Pen Inject 30 Units into the skin daily. 3 mL 0   No current facility-administered medications on file prior to visit.   Past Medical History:  Diagnosis Date   Asthma    Calculus of gallbladder with chronic cholecystitis without obstruction 03/22/2017   Diabetes ([place])    Epilepsy ([place])    Hypertension    Sleep apnea    Past Surgical History:  Procedure Laterality Date   APPENDECTOMY     TONSILLECTOMY     TONSILLECTOMY      Family History  Problem Relation Age of Onset   Colon cancer Mother        d. 46   Hypertension Father    Diabetes Father    Allergic rhinitis Sister    Hypertension Sister    Colon cancer Maternal Grandmother        dx 96s   Colon cancer Paternal Grandmother  dx 11s   Diabetes Paternal Grandmother    Cancer Maternal Uncle    Breast cancer Neg Hx    Social History   Socioeconomic History   Marital status: Married    Spouse name: Not on file   Number of children: Not on file   Years of education: Not on file   Highest education level: Not on file  Occupational History   Not on file  Tobacco Use   Smoking status: Never   Smokeless tobacco: Never  Vaping Use   Vaping Use: Never used  Substance and Sexual Activity   Alcohol use: Not Currently   Drug use: Never   Sexual activity: Not on file  Other Topics Concern   Not on file  Social History Narrative   Not on file   Social Determinants of Health   Financial Resource Strain: Low Risk  (09/10/2021)   Overall Financial Resource Strain (CARDIA)    Difficulty of Paying Living Expenses: Not  hard at all  Food Insecurity: No Food Insecurity (08/22/2020)   Hunger Vital Sign    Worried About Running Out of Food in the Last Year: Never true    [place] in the Last Year: Never true  Transportation Needs: No Transportation Needs (09/10/2021)   PRAPARE - Hydrologist (Medical): No    Lack of Transportation (Non-Medical): No  Physical Activity: Insufficiently Active (11/04/2020)   Exercise Vital Sign    Days of Exercise per Week: 2 days    Minutes of Exercise per Session: 20 min  Stress: No Stress Concern Present (11/04/2020)   [place]    Feeling of Stress : Not at all  Social Connections: Moderately Isolated (11/04/2020)   Social Connection and Isolation Panel [NHANES]    Frequency of Communication with Friends and Family: More than three times a week    Frequency of Social Gatherings with Friends and Family: Never    Attends Religious Services: Never    Marine scientist or Organizations: No    Attends Archivist Meetings: Never    Marital Status: Married    Review of Systems  Constitutional:  Negative for chills, fatigue and fever.  HENT:  Negative for congestion, ear pain and sore throat.   Eyes:  Negative for visual disturbance.  Respiratory:  Negative for cough and shortness of breath.   Cardiovascular:  Negative for chest pain and palpitations.  Gastrointestinal:  Negative for abdominal pain, constipation, diarrhea, nausea and vomiting.  Endocrine: Negative for polydipsia, polyphagia and polyuria.  Genitourinary:  Positive for dysuria. Negative for difficulty urinating.  Musculoskeletal:  Negative for arthralgias, back pain and myalgias.  Skin:  Negative for rash.  Neurological:  Negative for headaches.  Psychiatric/Behavioral:  Negative for dysphoric mood. The patient is not nervous/anxious.      Objective:  BP 130/80   Pulse 83   Temp 97.7 F  (36.5 C)   Resp 15   Ht _0  (1.626 m)   Wt 252 lb (114.3 kg)   LMP  (LMP Unknown)   SpO2 90%   BMI 43.26 kg/m      02/11/2022   11:10 AM 02/09/2022   11:30 AM 01/28/2022    1:49 PM  BP/Weight  Systolic BP 867 672 094  Diastolic BP 80 84 80  Wt. (Lbs) 252 249.5 249  BMI 43.26 kg/m2 42.83 kg/m2 42.74 kg/m2    Physical Exam Vitals  reviewed.  Constitutional:      Appearance: Normal appearance. She is obese.  HENT:     Head: Normocephalic.     Right Ear: Tympanic membrane normal.     Left Ear: Tympanic membrane normal.     Nose: Nose normal.     Mouth/Throat:     Mouth: Mucous membranes are moist.     Pharynx: Oropharynx is clear.  Eyes:     Extraocular Movements: Extraocular movements intact.     Conjunctiva/sclera: Conjunctivae normal.     Pupils: Pupils are equal, round, and reactive to light.  Cardiovascular:     Rate and Rhythm: Normal rate and regular rhythm.     Pulses: Normal pulses.     Heart sounds: Normal heart sounds. No murmur heard.    No gallop.  Pulmonary:     Effort: Pulmonary effort is normal. No respiratory distress.     Breath sounds: Normal breath sounds. No wheezing.  Abdominal:     General: Abdomen is flat. Bowel sounds are normal. There is no distension.     Palpations: Abdomen is soft.     Tenderness: There is no abdominal tenderness.  Musculoskeletal:        General: Normal range of motion.     Cervical back: Normal range of motion.  Skin:    General: Skin is warm.     Capillary Refill: Capillary refill takes less than 2 seconds.     Comments: # 3 2cm superficial ulcers buttocks with erythema in gluteal fold  Neurological:     General: No focal deficit present.     Mental Status: She is alert and oriented to person, place, and time. Mental status is at baseline.  Psychiatric:        Mood and Affect: Mood normal.        Thought Content: Thought content normal.        Judgment: Judgment normal.      07/03/2020   10:06 AM  Depression  screen PHQ 2/9  Decreased Interest 0  Down, Depressed, Hopeless 0  PHQ - 2 Score 0         Lab Results  Component Value Date   WBC 12.7 02/09/2022   HGB 11.3 (A) 02/09/2022   HCT 34 (A) 02/09/2022   PLT 458 (A) 02/09/2022   GLUCOSE 170 (H) 01/28/2022   CHOL 146 01/28/2022   TRIG 157 (H) 01/28/2022   HDL 52 01/28/2022   LDLCALC 67 01/28/2022   ALT 19 01/28/2022   AST 18 01/28/2022   NA 140 01/28/2022   K 4.3 01/28/2022   CL 100 01/28/2022   CREATININE 0.87 01/28/2022   BUN 13 01/28/2022   CO2 23 01/28/2022   TSH 1.170 06/05/2021   HGBA1C 7.5 (H) 01/28/2022   MICROALBUR 10 07/03/2020      Assessment & Plan:   Problem List Items Addressed This Visit       Other   Decubitus ulcers Good healing but still some superficial skin breakdown, no infection, we discussed keeping area dry and using cream   Other Visit Diagnoses     Acute cystitis without hematuria    -  Primary   Relevant Orders   POCT URINALYSIS DIP (CLINITEK) (Completed) Check urine culture     .    Orders Placed This Encounter  Procedures   POCT URINALYSIS DIP (CLINITEK)     Follow-up: Return in about 4 months (around 06/13/2022), or diabetes.  An After Visit Summary was printed and  given to the patient.  Reinaldo Meeker, MD [facility] [phone]

## 2022-02-12 ENCOUNTER — Inpatient Hospital Stay: Payer: Medicare Other

## 2022-02-12 VITALS — BP 174/80 | HR 75 | Temp 98.2°F | Resp 20 | Ht 64.0 in | Wt 252.8 lb

## 2022-02-12 DIAGNOSIS — D75839 Thrombocytosis, unspecified: Secondary | ICD-10-CM | POA: Diagnosis not present

## 2022-02-12 DIAGNOSIS — D72829 Elevated white blood cell count, unspecified: Secondary | ICD-10-CM | POA: Diagnosis not present

## 2022-02-12 DIAGNOSIS — D509 Iron deficiency anemia, unspecified: Secondary | ICD-10-CM | POA: Diagnosis not present

## 2022-02-12 MED ORDER — SODIUM CHLORIDE 0.9 % IV SOLN: Freq: Once | INTRAVENOUS | Status: AC

## 2022-02-12 MED ORDER — SODIUM CHLORIDE 0.9 % IV SOLN
200.0000 mg | Freq: Once | INTRAVENOUS | Status: AC
  Administered 2022-02-12: 200 mg via INTRAVENOUS
  Filled 2022-02-12: qty 200

## 2022-02-12 MED ORDER — FAMOTIDINE IN NACL 20-0.9 MG/50ML-% IV SOLN
20.0000 mg | Freq: Once | INTRAVENOUS | Status: AC
  Administered 2022-02-12: 20 mg via INTRAVENOUS
  Filled 2022-02-12: qty 50

## 2022-02-12 MED ORDER — ACETAMINOPHEN 325 MG PO TABS
650.0000 mg | ORAL_TABLET | Freq: Once | ORAL | Status: AC
  Administered 2022-02-12: 650 mg via ORAL
  Filled 2022-02-12: qty 2

## 2022-02-12 NOTE — Patient Instructions (Signed)
Iron Deficiency Anemia, Adult  Iron deficiency anemia is a condition in which the concentration of red blood cells or hemoglobin in the blood is below normal because of too little iron. Hemoglobin is a substance in red blood cells that carries oxygen to the body's tissues. When the concentration of red blood cells or hemoglobin is too low, not enough oxygen reaches these tissues. Iron deficiency anemia is usually long-lasting, and it develops over time. It may or may not cause symptoms. It is a common type of anemia. What are the causes? This condition may be caused by: Not enough iron in the diet. Abnormal absorption in the gut. Blood loss. What increases the risk? You are more likely to develop this condition if you get menstrual periods (menstruate) or are pregnant. What are the signs or symptoms? Symptoms of this condition may include: Pale skin, lips, and nail beds. Weakness, dizziness, and getting tired easily. Shortness of breath when moving or exercising. Cold hands or feet. Mild anemia may not cause any symptoms. How is this diagnosed? This condition is diagnosed based on: Your medical history. A physical exam. Blood tests. How is this treated? This condition is treated by correcting the cause of your iron deficiency. Treatment may involve: Adding iron-rich foods to your diet. Taking iron supplements. If you are pregnant or breastfeeding, you may need to take extra iron because your normal diet usually does not provide the amount of iron that you need. Increasing vitamin C intake. Vitamin C helps your body absorb iron. Your health care provider may recommend that you take iron supplements along with a glass of orange juice or a vitamin C supplement. Medicines to make heavy menstrual flow lighter. Surgery or additional testing procedures to determine the cause of your anemia. You may need repeat blood tests to determine whether treatment is working. If the treatment does not  seem to be working, you may need more tests. Follow these instructions at home: Medicines Take over-the-counter and prescription medicines only as told by your health care provider. This includes iron supplements and vitamins. This is important because too much iron can be harmful. For the best iron absorption, you should take iron supplements when your stomach is empty. If you cannot tolerate them on an empty stomach, you may need to take them with food. Do not drink milk or take antacids at the same time as your iron supplements. Milk and antacids may interfere with how your body absorbs iron. Iron supplements may turn stool (feces) a darker color and it may appear black. If you cannot tolerate taking iron supplements by mouth, talk with your health care provider about taking them through an IV or through an injection into a muscle. Eating and drinking Talk with your health care provider before changing your diet. Your provider may recommend that you eat foods that contain a lot of iron, such as: Liver. Low-fat (lean) beef. Breads and cereals that have iron added to them (are fortified). Eggs. Dried fruit. Dark green, leafy vegetables. To help your body use the iron from iron-rich foods, eat those foods at the same time as fresh fruits and vegetables that are high in vitamin C. Foods that are high in vitamin C include: Oranges. Peppers. Tomatoes. Mangoes. Managing constipation If you are taking an iron supplement, it may cause constipation. To prevent or treat constipation, you may need to: Drink enough fluid to keep your urine pale yellow. Take over-the-counter or prescription medicines. Eat foods that are high in fiber, such   as beans, whole grains, and fresh fruits and vegetables. Limit foods that are high in fat and processed sugars, such as fried or sweet foods. General instructions Return to your normal activities as told by your health care provider. Ask your health care provider  what activities are safe for you. Keep all follow-up visits. Contact a health care provider if: You feel nauseous or you vomit. You feel weak. You become light-headed when getting up from a sitting or lying down position. You have unexplained sweating. You develop symptoms of constipation. You have a heaviness in your chest. You have trouble breathing with physical activity. Get help right away if: You faint. If this happens, do not drive yourself to the hospital. You have an irregular or rapid heartbeat. Summary Iron deficiency anemia is a condition in which the concentration of red blood cells or hemoglobin in the blood is below normal because of too little iron. This condition is treated by correcting the cause of your iron deficiency. Take over-the-counter and prescription medicines only as told by your health care provider. This includes iron supplements and vitamins. To help your body use the iron from iron-rich foods, eat those foods at the same time as fresh fruits and vegetables that are high in vitamin C. Seek medical help if you have signs or symptoms of worsening anemia. This information is not intended to replace advice given to you by your health care provider. Make sure you discuss any questions you have with your health care provider. Document Revised: 06/11/2021 Document Reviewed: 06/11/2021 Elsevier Patient Education  2023 [employer] Iron-Rich Diet  Iron is a mineral that helps your body produce hemoglobin. Hemoglobin is a protein in red blood cells that carries oxygen to your body's tissues. Eating too little iron may cause you to feel weak and tired, and it can increase your risk of infection. Iron is naturally found in many foods, and many foods have iron added to them (are iron-fortified). You may need to follow an iron-rich diet if you do not have enough iron in your body due to certain medical conditions. The amount of iron that you need each day depends on your  age, your sex, and any medical conditions you have. Follow instructions from your health care provider or a dietitian about how much iron you should eat each day. What are tips for following this plan? Reading food labels Check food labels to see how many milligrams (mg) of iron are in each serving. Cooking Cook foods in pots and pans that are made from iron. Take these steps to make it easier for your body to absorb iron from certain foods: Soak beans overnight before cooking. Soak whole grains overnight and drain them before using. Ferment flours before baking, such as by using yeast in bread dough. Meal planning When you eat foods that contain iron, you should eat them with foods that are high in vitamin C. These include oranges, peppers, tomatoes, potatoes, and mangoes. Vitamin C helps your body absorb iron. Certain foods and drinks prevent your body from absorbing iron properly. Avoid eating these foods in the same meal as iron-rich foods or with iron supplements. These foods include: Coffee, black tea, and red wine. Milk, dairy products, and foods that are high in calcium. Beans and soybeans. Whole grains. General information Take iron supplements only as told by your health care provider. An overdose of iron can be life-threatening. If you were prescribed iron supplements, take them with orange juice or a vitamin C   supplement. When you eat iron-fortified foods or take an iron supplement, you should also eat foods that naturally contain iron, such as meat, poultry, and fish. Eating naturally iron-rich foods helps your body absorb the iron that is added to other foods or contained in a supplement. Iron from animal sources is better absorbed than iron from plant sources. What foods should I eat? Fruits Prunes. Raisins. Eat fruits high in vitamin C, such as oranges, grapefruits, and strawberries, with iron-rich foods. Vegetables Spinach (cooked). Green peas. Broccoli. Fermented  vegetables. Eat vegetables high in vitamin C, such as leafy greens, potatoes, bell peppers, and tomatoes, with iron-rich foods. Grains Iron-fortified breakfast cereal. Iron-fortified whole-wheat bread. Enriched rice. Sprouted grains. Meats and other proteins Beef liver. Beef. Turkey. Chicken. Oysters. Shrimp. Tuna. Sardines. Chickpeas. Nuts. Tofu. Pumpkin seeds. Beverages Tomato juice. Fresh orange juice. Prune juice. Hibiscus tea. Iron-fortified instant breakfast shakes. Sweets and desserts Blackstrap molasses. Seasonings and condiments Tahini. Fermented soy sauce. Other foods Wheat germ. The items listed above may not be a complete list of recommended foods and beverages. Contact a dietitian for more information. What foods should I limit? These are foods that should be limited while eating iron-rich foods as they can reduce the absorption of iron in your body. Grains Whole grains. Bran cereal. Bran flour. Meats and other proteins Soybeans. Products made from soy protein. Black beans. Lentils. Mung beans. Split peas. Dairy Milk. Cream. Cheese. Yogurt. Cottage cheese. Beverages Coffee. Black tea. Red wine. Sweets and desserts Cocoa. Chocolate. Ice cream. Seasonings and condiments Basil. Oregano. Large amounts of parsley. The items listed above may not be a complete list of foods and beverages you should limit. Contact a dietitian for more information. Summary Iron is a mineral that helps your body produce hemoglobin. Hemoglobin is a protein in red blood cells that carries oxygen to your body's tissues. Iron is naturally found in many foods, and many foods have iron added to them (are iron-fortified). When you eat foods that contain iron, you should eat them with foods that are high in vitamin C. Vitamin C helps your body absorb iron. Certain foods and drinks prevent your body from absorbing iron properly, such as whole grains and dairy products. You should avoid eating these foods  in the same meal as iron-rich foods or with iron supplements. This information is not intended to replace advice given to you by your health care provider. Make sure you discuss any questions you have with your health care provider. Document Revised: 04/15/2020 Document Reviewed: 04/15/2020 Elsevier Patient Education  2023 [employer]  

## 2022-02-24 MED FILL — Iron Sucrose Inj 20 MG/ML (Fe Equiv): INTRAVENOUS | Qty: 10 | Status: AC

## 2022-02-25 ENCOUNTER — Other Ambulatory Visit: Payer: Self-pay | Admitting: Pharmacist

## 2022-02-25 ENCOUNTER — Inpatient Hospital Stay: Payer: Medicare Other | Attending: Oncology

## 2022-02-25 VITALS — BP 130/74 | HR 75 | Temp 98.8°F | Resp 18 | Ht 64.0 in | Wt 247.0 lb

## 2022-02-25 DIAGNOSIS — D509 Iron deficiency anemia, unspecified: Secondary | ICD-10-CM | POA: Insufficient documentation

## 2022-02-25 MED ORDER — ACETAMINOPHEN 325 MG PO TABS
650.0000 mg | ORAL_TABLET | Freq: Once | ORAL | Status: AC
  Administered 2022-02-25: 650 mg via ORAL
  Filled 2022-02-25: qty 2

## 2022-02-25 MED ORDER — SODIUM CHLORIDE 0.9 % IV SOLN
200.0000 mg | Freq: Once | INTRAVENOUS | Status: AC
  Administered 2022-02-25: 200 mg via INTRAVENOUS
  Filled 2022-02-25: qty 200

## 2022-02-25 MED ORDER — SODIUM CHLORIDE 0.9 % IV SOLN: Freq: Once | INTRAVENOUS | Status: AC

## 2022-02-25 MED ORDER — FAMOTIDINE IN NACL 20-0.9 MG/50ML-% IV SOLN
20.0000 mg | Freq: Once | INTRAVENOUS | Status: AC
  Administered 2022-02-25: 20 mg via INTRAVENOUS
  Filled 2022-02-25: qty 50

## 2022-02-25 MED FILL — Iron Sucrose Inj 20 MG/ML (Fe Equiv): INTRAVENOUS | Qty: 10 | Status: AC

## 2022-02-25 NOTE — Progress Notes (Signed)
Iv site wrapped per pt request.  Pt back tomorrow for next transfusion.

## 2022-02-25 NOTE — Patient Instructions (Signed)
Iron Deficiency Anemia, Adult  Iron deficiency anemia is a condition in which the concentration of red blood cells or hemoglobin in the blood is below normal because of too little iron. Hemoglobin is a substance in red blood cells that carries oxygen to the body's tissues. When the concentration of red blood cells or hemoglobin is too low, not enough oxygen reaches these tissues. Iron deficiency anemia is usually long-lasting, and it develops over time. It may or may not cause symptoms. It is a common type of anemia. What are the causes? This condition may be caused by: Not enough iron in the diet. Abnormal absorption in the gut. Blood loss. What increases the risk? You are more likely to develop this condition if you get menstrual periods (menstruate) or are pregnant. What are the signs or symptoms? Symptoms of this condition may include: Pale skin, lips, and nail beds. Weakness, dizziness, and getting tired easily. Shortness of breath when moving or exercising. Cold hands or feet. Mild anemia may not cause any symptoms. How is this diagnosed? This condition is diagnosed based on: Your medical history. A physical exam. Blood tests. How is this treated? This condition is treated by correcting the cause of your iron deficiency. Treatment may involve: Adding iron-rich foods to your diet. Taking iron supplements. If you are pregnant or breastfeeding, you may need to take extra iron because your normal diet usually does not provide the amount of iron that you need. Increasing vitamin C intake. Vitamin C helps your body absorb iron. Your health care provider may recommend that you take iron supplements along with a glass of orange juice or a vitamin C supplement. Medicines to make heavy menstrual flow lighter. Surgery or additional testing procedures to determine the cause of your anemia. You may need repeat blood tests to determine whether treatment is working. If the treatment does not  seem to be working, you may need more tests. Follow these instructions at home: Medicines Take over-the-counter and prescription medicines only as told by your health care provider. This includes iron supplements and vitamins. This is important because too much iron can be harmful. For the best iron absorption, you should take iron supplements when your stomach is empty. If you cannot tolerate them on an empty stomach, you may need to take them with food. Do not drink milk or take antacids at the same time as your iron supplements. Milk and antacids may interfere with how your body absorbs iron. Iron supplements may turn stool (feces) a darker color and it may appear black. If you cannot tolerate taking iron supplements by mouth, talk with your health care provider about taking them through an IV or through an injection into a muscle. Eating and drinking Talk with your health care provider before changing your diet. Your provider may recommend that you eat foods that contain a lot of iron, such as: Liver. Low-fat (lean) beef. Breads and cereals that have iron added to them (are fortified). Eggs. Dried fruit. Dark green, leafy vegetables. To help your body use the iron from iron-rich foods, eat those foods at the same time as fresh fruits and vegetables that are high in vitamin C. Foods that are high in vitamin C include: Oranges. Peppers. Tomatoes. Mangoes. Managing constipation If you are taking an iron supplement, it may cause constipation. To prevent or treat constipation, you may need to: Drink enough fluid to keep your urine pale yellow. Take over-the-counter or prescription medicines. Eat foods that are high in fiber, such   as beans, whole grains, and fresh fruits and vegetables. Limit foods that are high in fat and processed sugars, such as fried or sweet foods. General instructions Return to your normal activities as told by your health care provider. Ask your health care provider  what activities are safe for you. Keep all follow-up visits. Contact a health care provider if: You feel nauseous or you vomit. You feel weak. You become light-headed when getting up from a sitting or lying down position. You have unexplained sweating. You develop symptoms of constipation. You have a heaviness in your chest. You have trouble breathing with physical activity. Get help right away if: You faint. If this happens, do not drive yourself to the hospital. You have an irregular or rapid heartbeat. Summary Iron deficiency anemia is a condition in which the concentration of red blood cells or hemoglobin in the blood is below normal because of too little iron. This condition is treated by correcting the cause of your iron deficiency. Take over-the-counter and prescription medicines only as told by your health care provider. This includes iron supplements and vitamins. To help your body use the iron from iron-rich foods, eat those foods at the same time as fresh fruits and vegetables that are high in vitamin C. Seek medical help if you have signs or symptoms of worsening anemia. This information is not intended to replace advice given to you by your health care provider. Make sure you discuss any questions you have with your health care provider. Document Revised: 06/11/2021 Document Reviewed: 06/11/2021 Elsevier Patient Education  2023 [employer]  

## 2022-02-26 ENCOUNTER — Inpatient Hospital Stay: Payer: Medicare Other

## 2022-02-26 VITALS — BP 140/74 | HR 81 | Temp 98.1°F | Resp 18 | Ht 64.0 in | Wt 246.0 lb

## 2022-02-26 DIAGNOSIS — D509 Iron deficiency anemia, unspecified: Secondary | ICD-10-CM

## 2022-02-26 MED ORDER — ACETAMINOPHEN 325 MG PO TABS
650.0000 mg | ORAL_TABLET | Freq: Once | ORAL | Status: AC
  Administered 2022-02-26: 650 mg via ORAL
  Filled 2022-02-26: qty 2

## 2022-02-26 MED ORDER — SODIUM CHLORIDE 0.9 % IV SOLN: Freq: Once | INTRAVENOUS | Status: AC

## 2022-02-26 MED ORDER — FAMOTIDINE IN NACL 20-0.9 MG/50ML-% IV SOLN
20.0000 mg | Freq: Once | INTRAVENOUS | Status: AC
  Administered 2022-02-26: 20 mg via INTRAVENOUS
  Filled 2022-02-26: qty 50

## 2022-02-26 MED ORDER — SODIUM CHLORIDE 0.9 % IV SOLN
200.0000 mg | Freq: Once | INTRAVENOUS | Status: AC
  Administered 2022-02-26: 200 mg via INTRAVENOUS
  Filled 2022-02-26: qty 200

## 2022-02-26 MED FILL — Iron Sucrose Inj 20 MG/ML (Fe Equiv): INTRAVENOUS | Qty: 10 | Status: AC

## 2022-02-26 NOTE — Patient Instructions (Signed)

## 2022-02-27 ENCOUNTER — Inpatient Hospital Stay: Payer: Medicare Other

## 2022-02-27 VITALS — BP 159/77 | HR 85 | Temp 97.8°F | Resp 16 | Ht 64.0 in | Wt 250.1 lb

## 2022-02-27 DIAGNOSIS — D509 Iron deficiency anemia, unspecified: Secondary | ICD-10-CM

## 2022-02-27 MED ORDER — FAMOTIDINE IN NACL 20-0.9 MG/50ML-% IV SOLN
20.0000 mg | Freq: Once | INTRAVENOUS | Status: AC
  Administered 2022-02-27: 20 mg via INTRAVENOUS
  Filled 2022-02-27: qty 50

## 2022-02-27 MED ORDER — ACETAMINOPHEN 325 MG PO TABS
650.0000 mg | ORAL_TABLET | Freq: Once | ORAL | Status: AC
  Administered 2022-02-27: 650 mg via ORAL
  Filled 2022-02-27: qty 2

## 2022-02-27 MED ORDER — SODIUM CHLORIDE 0.9 % IV SOLN: Freq: Once | INTRAVENOUS | Status: AC

## 2022-02-27 MED ORDER — SODIUM CHLORIDE 0.9 % IV SOLN
200.0000 mg | Freq: Once | INTRAVENOUS | Status: AC
  Administered 2022-02-27: 200 mg via INTRAVENOUS
  Filled 2022-02-27: qty 200

## 2022-02-27 NOTE — Patient Instructions (Signed)

## 2022-03-02 ENCOUNTER — Inpatient Hospital Stay: Payer: Medicare Other

## 2022-03-02 VITALS — BP 150/72 | HR 71 | Temp 98.5°F | Resp 18

## 2022-03-02 DIAGNOSIS — D509 Iron deficiency anemia, unspecified: Secondary | ICD-10-CM | POA: Diagnosis not present

## 2022-03-02 MED ORDER — ACETAMINOPHEN 325 MG PO TABS
650.0000 mg | ORAL_TABLET | Freq: Once | ORAL | Status: AC
  Administered 2022-03-02: 650 mg via ORAL
  Filled 2022-03-02: qty 2

## 2022-03-02 MED ORDER — SODIUM CHLORIDE 0.9 % IV SOLN
200.0000 mg | Freq: Once | INTRAVENOUS | Status: AC
  Administered 2022-03-02: 200 mg via INTRAVENOUS
  Filled 2022-03-02: qty 10

## 2022-03-02 MED ORDER — DOXYCYCLINE HYCLATE 100 MG PO TABS: 100.0000 mg | ORAL_TABLET | Freq: Two times a day (BID) | ORAL | 0 refills | Status: AC

## 2022-03-02 MED ORDER — FAMOTIDINE IN NACL 20-0.9 MG/50ML-% IV SOLN
20.0000 mg | Freq: Once | INTRAVENOUS | Status: AC
  Administered 2022-03-02: 20 mg via INTRAVENOUS
  Filled 2022-03-02: qty 50

## 2022-03-02 MED ORDER — SODIUM CHLORIDE 0.9 % IV SOLN: Freq: Once | INTRAVENOUS | Status: AC

## 2022-03-02 NOTE — Progress Notes (Signed)
Pt instructed that antibiotics have been called in per [name] phy,rph.  Pt is to take 1 tablet by mouth twice a day for 10 days.  Stressed importance of finishing all antibiotics and that if symptoms worsen pt is to see primary dr.  [name] verbalizes understanding.

## 2022-03-02 NOTE — Patient Instructions (Signed)
Cellulitis, Adult  Cellulitis is a skin infection. The infected area is often warm, red, swollen, and sore. It occurs most often in the arms and lower legs. It is very important to get treated for this condition. What are the causes? This condition is caused by bacteria. The bacteria enter through a break in the skin, such as a cut, burn, insect bite, open sore, or crack. What increases the risk? This condition is more likely to occur in people who: Have a weak body defense system (immune system). Have open cuts, burns, bites, or scrapes on the skin. Are older than 65 years of age. Have a blood sugar problem (diabetes). Have a long-lasting (chronic) liver disease (cirrhosis) or kidney disease. Are very overweight (obese). Have a skin problem, such as: Itchy rash (eczema). Slow movement of blood in the veins (venous stasis). Fluid buildup below the skin (edema). Have been treated with high-energy rays (radiation). Use IV drugs. What are the signs or symptoms? Symptoms of this condition include: Skin that is: Red. Streaking. Spotting. Swollen. Sore or painful when you touch it. Warm. A fever. Chills. Blisters. How is this diagnosed? This condition is diagnosed based on: Medical history. Physical exam. Blood tests. Imaging tests. How is this treated? Treatment for this condition may include: Medicines to treat infections or allergies. Home care, such as: Rest. Placing cold or warm cloths (compresses) on the skin. Hospital care, if the condition is very bad. Follow these instructions at home: Medicines Take over-the-counter and prescription medicines only as told by your doctor. If you were prescribed an antibiotic medicine, take it as told by your doctor. Do not stop taking it even if you start to feel better. General instructions  Drink enough fluid to keep your pee (urine) pale yellow. Do not touch or rub the infected area. Raise (elevate) the infected area above  the level of your heart while you are sitting or lying down. Place cold or warm cloths on the area as told by your doctor. Keep all follow-up visits as told by your doctor. This is important. Contact a doctor if: You have a fever. You do not start to get better after 1-2 days of treatment. Your bone or joint under the infected area starts to hurt after the skin has healed. Your infection comes back. This can happen in the same area or another area. You have a swollen bump in the area. You have new symptoms. You feel ill and have muscle aches and pains. Get help right away if: Your symptoms get worse. You feel very sleepy. You throw up (vomit) or have watery poop (diarrhea) for a long time. You see red streaks coming from the area. Your red area gets larger. Your red area turns dark in color. These symptoms may represent a serious problem that is an emergency. Do not wait to see if the symptoms will go away. Get medical help right away. Call your local emergency services (911 in the [place] Do not drive yourself to the hospital. Summary Cellulitis is a skin infection. The area is often warm, red, swollen, and sore. This condition is treated with medicines, rest, and cold and warm cloths. Take all medicines only as told by your doctor. Tell your doctor if symptoms do not start to get better after 1-2 days of treatment. This information is not intended to replace advice given to you by your health care provider. Make sure you discuss any questions you have with your health care provider. Document Revised: 02/13/2021 Document   Reviewed: 02/13/2021 Elsevier Patient Education  [place]. Iron Deficiency Anemia, Adult  Iron deficiency anemia is a condition in which the concentration of red blood cells or hemoglobin in the blood is below normal because of too little iron. Hemoglobin is a substance in red blood cells that carries oxygen to the body's tissues. When the concentration of red  blood cells or hemoglobin is too low, not enough oxygen reaches these tissues. Iron deficiency anemia is usually long-lasting, and it develops over time. It may or may not cause symptoms. It is a common type of anemia. What are the causes? This condition may be caused by: Not enough iron in the diet. Abnormal absorption in the gut. Blood loss. What increases the risk? You are more likely to develop this condition if you get menstrual periods (menstruate) or are pregnant. What are the signs or symptoms? Symptoms of this condition may include: Pale skin, lips, and nail beds. Weakness, dizziness, and getting tired easily. Shortness of breath when moving or exercising. Cold hands or feet. Mild anemia may not cause any symptoms. How is this diagnosed? This condition is diagnosed based on: Your medical history. A physical exam. Blood tests. How is this treated? This condition is treated by correcting the cause of your iron deficiency. Treatment may involve: Adding iron-rich foods to your diet. Taking iron supplements. If you are pregnant or breastfeeding, you may need to take extra iron because your normal diet usually does not provide the amount of iron that you need. Increasing vitamin C intake. Vitamin C helps your body absorb iron. Your health care provider may recommend that you take iron supplements along with a glass of orange juice or a vitamin C supplement. Medicines to make heavy menstrual flow lighter. Surgery or additional testing procedures to determine the cause of your anemia. You may need repeat blood tests to determine whether treatment is working. If the treatment does not seem to be working, you may need more tests. Follow these instructions at home: Medicines Take over-the-counter and prescription medicines only as told by your health care provider. This includes iron supplements and vitamins. This is important because too much iron can be harmful. For the best iron  absorption, you should take iron supplements when your stomach is empty. If you cannot tolerate them on an empty stomach, you may need to take them with food. Do not drink milk or take antacids at the same time as your iron supplements. Milk and antacids may interfere with how your body absorbs iron. Iron supplements may turn stool (feces) a darker color and it may appear black. If you cannot tolerate taking iron supplements by mouth, talk with your health care provider about taking them through an IV or through an injection into a muscle. Eating and drinking Talk with your health care provider before changing your diet. Your provider may recommend that you eat foods that contain a lot of iron, such as: Liver. Low-fat (lean) beef. Breads and cereals that have iron added to them (are fortified). Eggs. Dried fruit. Dark green, leafy vegetables. To help your body use the iron from iron-rich foods, eat those foods at the same time as fresh fruits and vegetables that are high in vitamin C. Foods that are high in vitamin C include: Oranges. Peppers. Tomatoes. Mangoes. Managing constipation If you are taking an iron supplement, it may cause constipation. To prevent or treat constipation, you may need to: Drink enough fluid to keep your urine pale yellow. Take over-the-counter or  prescription medicines. Eat foods that are high in fiber, such as beans, whole grains, and fresh fruits and vegetables. Limit foods that are high in fat and processed sugars, such as fried or sweet foods. General instructions Return to your normal activities as told by your health care provider. Ask your health care provider what activities are safe for you. Keep all follow-up visits. Contact a health care provider if: You feel nauseous or you vomit. You feel weak. You become light-headed when getting up from a sitting or lying down position. You have unexplained sweating. You develop symptoms of constipation. You  have a heaviness in your chest. You have trouble breathing with physical activity. Get help right away if: You faint. If this happens, do not drive yourself to the hospital. You have an irregular or rapid heartbeat. Summary Iron deficiency anemia is a condition in which the concentration of red blood cells or hemoglobin in the blood is below normal because of too little iron. This condition is treated by correcting the cause of your iron deficiency. Take over-the-counter and prescription medicines only as told by your health care provider. This includes iron supplements and vitamins. To help your body use the iron from iron-rich foods, eat those foods at the same time as fresh fruits and vegetables that are high in vitamin C. Seek medical help if you have signs or symptoms of worsening anemia. This information is not intended to replace advice given to you by your health care provider. Make sure you discuss any questions you have with your health care provider. Document Revised: 06/11/2021 Document Reviewed: 06/11/2021 Elsevier Patient Education  [place].

## 2022-03-02 NOTE — Progress Notes (Signed)
Red raised area noted to left forearm- area warm and patient reports pain at area- Alicia Kelly Phy at bedside to assess patient. Patient states understanding that antibiodic will be called into pharmacy- Patient agrees to take as directed

## 2022-03-10 ENCOUNTER — Other Ambulatory Visit: Payer: Medicare Other

## 2022-03-10 DIAGNOSIS — G40209 Localization-related (focal) (partial) symptomatic epilepsy and epileptic syndromes with complex partial seizures, not intractable, without status epilepticus: Secondary | ICD-10-CM

## 2022-03-11 ENCOUNTER — Telehealth: Payer: Medicaid Other

## 2022-03-11 LAB — LEVETIRACETAM LEVEL: Levetiracetam Lvl: 54 ug/mL — ABNORMAL HIGH (ref 10.0–40.0)

## 2022-03-12 NOTE — Progress Notes (Signed)
Levetiracetam 54- high- discuss with neurologist lp

## 2022-03-16 ENCOUNTER — Other Ambulatory Visit: Payer: Self-pay | Admitting: Family Medicine

## 2022-03-16 ENCOUNTER — Telehealth: Payer: Self-pay | Admitting: Legal Medicine

## 2022-03-16 DIAGNOSIS — I152 Hypertension secondary to endocrine disorders: Secondary | ICD-10-CM

## 2022-03-16 MED ORDER — ACCU-CHEK GUIDE VI STRP: ORAL_STRIP | 3 refills | Status: DC

## 2022-03-16 NOTE — Telephone Encounter (Signed)
glucose blood (ACCU-CHEK GUIDE) test strip   Pt request refill sent to [facility]

## 2022-03-17 ENCOUNTER — Encounter: Payer: Self-pay | Admitting: Legal Medicine

## 2022-03-17 ENCOUNTER — Ambulatory Visit (INDEPENDENT_AMBULATORY_CARE_PROVIDER_SITE_OTHER): Payer: Medicare Other | Admitting: Legal Medicine

## 2022-03-17 VITALS — BP 134/80 | HR 84 | Temp 97.3°F | Resp 18 | Ht 64.0 in | Wt 260.0 lb

## 2022-03-17 DIAGNOSIS — G40209 Localization-related (focal) (partial) symptomatic epilepsy and epileptic syndromes with complex partial seizures, not intractable, without status epilepticus: Secondary | ICD-10-CM | POA: Diagnosis not present

## 2022-03-17 DIAGNOSIS — R600 Localized edema: Secondary | ICD-10-CM | POA: Diagnosis not present

## 2022-03-17 DIAGNOSIS — M47816 Spondylosis without myelopathy or radiculopathy, lumbar region: Secondary | ICD-10-CM | POA: Diagnosis not present

## 2022-03-17 MED ORDER — FUROSEMIDE 20 MG PO TABS: 20.0000 mg | ORAL_TABLET | Freq: Every day | ORAL | 3 refills | Status: DC

## 2022-03-17 NOTE — Progress Notes (Signed)
Subjective:  Patient ID: Alicia Kelly, female    DOB: Nov 25, 1955  Age: 66 y.o. MRN: [id number]  Chief Complaint  Patient presents with   Back Pain    Back Pain Pertinent negatives include no abdominal pain, chest pain, dysuria or headaches.     Patient states her back has been hurting for about 2 months.  Patient walks slowly on pain scale patient states its a 7 She has seen neurologists and Dr. Jacki Cones with epidurals.  Now 2 months of back pain.No neuroogic effects  Iron deficiency on iron infusions..    3+ ankle edema, no TED hose.   Current Outpatient Medications on File Prior to Visit  Medication Sig Dispense Refill   Accu-Chek Softclix Lancets lancets TEST BLOOD SUGAR THREE TIMES DAILY 300 each 1   atorvastatin (LIPITOR) 40 MG tablet Take 1 tablet (40 mg total) by mouth daily. 90 tablet 2   B-D UF III MINI PEN NEEDLES 31G X 5 MM MISC USE 1 PEN NEEDLE DAILY 100 each 2   Blood Glucose Calibration (ACCU-CHEK AVIVA) SOLN      Blood Glucose Monitoring Suppl (ACCU-CHEK AVIVA PLUS) w/Device KIT 1 each by Does not apply route 3 (three) times daily. 1 kit 0   clotrimazole-betamethasone (LOTRISONE) cream Apply 1 Application topically daily. 30 g 3   cyclobenzaprine (FLEXERIL) 10 MG tablet Take 1 tablet (10 mg total) by mouth 3 (three) times daily as needed for muscle spasms. 30 tablet 0   fluticasone (FLONASE) 50 MCG/ACT nasal spray Place 2 sprays into both nostrils daily.     glucose blood (ACCU-CHEK GUIDE) test strip Check sugars threes times daily 300 each 3   Insulin Pen Needle (NOVOFINE PEN NEEDLE) 32G X 6 MM MISC 1 each by Does not apply route every 7 (seven) days. 50 each 3   lacosamide (VIMPAT) 200 MG TABS tablet Take 1.5 tablets (300 mg total) by mouth 2 (two) times daily. 180 tablet 2   levETIRAcetam (KEPPRA) 1000 MG tablet Take 1 tablet (1,000 mg total) by mouth in the morning, at noon, and at bedtime. 90 tablet 6   losartan-hydrochlorothiazide (HYZAAR) 100-25 MG  tablet Take 1 tablet by mouth daily. 90 tablet 2   montelukast (SINGULAIR) 10 MG tablet Take 1 tablet (10 mg total) by mouth daily. as directed 90 tablet 2   pantoprazole (PROTONIX) 40 MG tablet Take 1 tablet (40 mg total) by mouth 2 (two) times daily. 180 tablet 1   Semaglutide, 1 MG/DOSE, (OZEMPIC, 1 MG/DOSE,) 4 MG/3ML SOPN Inject 1 mg into the skin once a week. 3 mL 0   terbinafine (LAMISIL) 250 MG tablet Take 1 tablet (250 mg total) by mouth daily. 30 tablet 0   TRESIBA FLEXTOUCH 100 UNIT/ML FlexTouch Pen Inject 30 Units into the skin daily. 3 mL 0   No current facility-administered medications on file prior to visit.   Past Medical History:  Diagnosis Date   Asthma    Calculus of gallbladder with chronic cholecystitis without obstruction 03/22/2017   Diabetes ([place])    Epilepsy ([place])    Hypertension    Sleep apnea    Past Surgical History:  Procedure Laterality Date   APPENDECTOMY     TONSILLECTOMY     TONSILLECTOMY      Family History  Problem Relation Age of Onset   Colon cancer Mother        d. 66   Hypertension Father    Diabetes Father    Allergic rhinitis Sister  Hypertension Sister    Colon cancer Maternal Grandmother        dx 2s   Colon cancer Paternal Grandmother        dx 51s   Diabetes Paternal Grandmother    Cancer Maternal Uncle    Breast cancer Neg Hx    Social History   Socioeconomic History   Marital status: Married    Spouse name: Not on file   Number of children: Not on file   Years of education: Not on file   Highest education level: Not on file  Occupational History   Not on file  Tobacco Use   Smoking status: Never   Smokeless tobacco: Never  Vaping Use   Vaping Use: Never used  Substance and Sexual Activity   Alcohol use: Not Currently   Drug use: Never   Sexual activity: Not on file  Other Topics Concern   Not on file  Social History Narrative   Not on file   Social Determinants of Health   Financial Resource Strain: Low  Risk  (09/10/2021)   Overall Financial Resource Strain (CARDIA)    Difficulty of Paying Living Expenses: Not hard at all  Food Insecurity: No Food Insecurity (08/22/2020)   Hunger Vital Sign    Worried About Running Out of Food in the Last Year: Never true    [place] in the Last Year: Never true  Transportation Needs: No Transportation Needs (09/10/2021)   PRAPARE - Hydrologist (Medical): No    Lack of Transportation (Non-Medical): No  Physical Activity: Insufficiently Active (11/04/2020)   Exercise Vital Sign    Days of Exercise per Week: 2 days    Minutes of Exercise per Session: 20 min  Stress: No Stress Concern Present (11/04/2020)   [place]    Feeling of Stress : Not at all  Social Connections: Moderately Isolated (11/04/2020)   Social Connection and Isolation Panel [NHANES]    Frequency of Communication with Friends and Family: More than three times a week    Frequency of Social Gatherings with Friends and Family: Never    Attends Religious Services: Never    Marine scientist or Organizations: No    Attends Archivist Meetings: Never    Marital Status: Married    Review of Systems  Constitutional:  Negative for chills and fatigue.  HENT:  Negative for congestion, ear pain, nosebleeds, sinus pain and sore throat.   Eyes:  Negative for visual disturbance.  Respiratory:  Negative for cough and shortness of breath.   Cardiovascular:  Negative for chest pain and leg swelling.  Gastrointestinal:  Negative for abdominal pain, constipation, diarrhea, nausea and vomiting.  Endocrine: Negative for polyuria.  Genitourinary:  Negative for dysuria and frequency.  Musculoskeletal:  Positive for back pain. Negative for arthralgias and myalgias.  Skin: Negative.   Neurological:  Negative for dizziness and headaches.     Objective:  BP 134/80   Pulse 84   Temp (!) 97.3  F (36.3 C)   Resp 18   Ht _0  (1.626 m)   Wt 260 lb (117.9 kg)   LMP  (LMP Unknown)   SpO2 97%   BMI 44.63 kg/m      03/17/2022    3:10 PM 03/02/2022   12:31 PM 03/02/2022   10:59 AM  BP/Weight  Systolic BP 737 106 269  Diastolic BP 80 72 71  Wt. (Lbs) 260    BMI 44.63 kg/m2      Physical Exam Vitals reviewed.  Constitutional:      Appearance: Normal appearance. She is obese.  HENT:     Head: Normocephalic.     Right Ear: Tympanic membrane normal.     Left Ear: Tympanic membrane normal.     Nose: Nose normal.     Mouth/Throat:     Mouth: Mucous membranes are moist.     Pharynx: Oropharynx is clear.  Eyes:     Extraocular Movements: Extraocular movements intact.     Conjunctiva/sclera: Conjunctivae normal.     Pupils: Pupils are equal, round, and reactive to light.  Cardiovascular:     Rate and Rhythm: Normal rate and regular rhythm.     Pulses: Normal pulses.     Heart sounds: Normal heart sounds. No murmur heard.    No gallop.  Pulmonary:     Effort: Pulmonary effort is normal. No respiratory distress.     Breath sounds: Normal breath sounds. No wheezing.  Abdominal:     General: Abdomen is flat. Bowel sounds are normal. There is no distension.     Palpations: Abdomen is soft.     Tenderness: There is no abdominal tenderness.  Musculoskeletal:        General: Tenderness present. Normal range of motion.     Cervical back: Normal range of motion and neck supple.     Right lower leg: Edema present.     Left lower leg: Edema present.     Comments: Lumbar spine painful, L5-S1, pain on flexion and extension.  Neg SLR,  3+ edema  Skin:    General: Skin is warm.  Neurological:     General: No focal deficit present.     Mental Status: She is alert and oriented to person, place, and time. Mental status is at baseline.     Gait: Gait abnormal.  Psychiatric:        Mood and Affect: Mood normal.        Thought Content: Thought content normal.         Lab  Results  Component Value Date   WBC 12.7 02/09/2022   HGB 11.3 (A) 02/09/2022   HCT 34 (A) 02/09/2022   PLT 458 (A) 02/09/2022   GLUCOSE 170 (H) 01/28/2022   CHOL 146 01/28/2022   TRIG 157 (H) 01/28/2022   HDL 52 01/28/2022   LDLCALC 67 01/28/2022   ALT 19 01/28/2022   AST 18 01/28/2022   NA 140 01/28/2022   K 4.3 01/28/2022   CL 100 01/28/2022   CREATININE 0.87 01/28/2022   BUN 13 01/28/2022   CO2 23 01/28/2022   TSH 1.170 06/05/2021   HGBA1C 7.5 (H) 01/28/2022   MICROALBUR 10 07/03/2020      Assessment & Plan:   Problem List Items Addressed This Visit       Musculoskeletal and Integument   Lumbar spondylosis   Relevant Orders   AMB referral to orthopedics Recurrent back pain, I offered medicines and referral to orthopedics     Other   Complex partial seizures ([place]) - Primary Seizures are stable    Pedal edema   Relevant Medications   furosemide (LASIX) 20 MG tablet Pedal edema with no CHF, start lasix 19m qd for one week  .       Follow-up: Return if symptoms worsen or fail to improve.  An After Visit Summary was printed and given to the patient.  Reinaldo Meeker, MD [facility] [phone]

## 2022-03-17 NOTE — Patient Instructions (Signed)

## 2022-03-24 ENCOUNTER — Other Ambulatory Visit: Payer: Self-pay

## 2022-03-24 DIAGNOSIS — I152 Hypertension secondary to endocrine disorders: Secondary | ICD-10-CM

## 2022-03-24 MED ORDER — TRESIBA FLEXTOUCH 100 UNIT/ML ~~LOC~~ SOPN: 30.0000 [IU] | PEN_INJECTOR | Freq: Every day | SUBCUTANEOUS | 0 refills | Status: DC

## 2022-03-26 DIAGNOSIS — M5136 Other intervertebral disc degeneration, lumbar region: Secondary | ICD-10-CM | POA: Diagnosis not present

## 2022-03-30 ENCOUNTER — Other Ambulatory Visit: Payer: Self-pay

## 2022-03-30 DIAGNOSIS — B354 Tinea corporis: Secondary | ICD-10-CM

## 2022-03-30 MED ORDER — CLOTRIMAZOLE-BETAMETHASONE 1-0.05 % EX CREA: 1.0000 | TOPICAL_CREAM | Freq: Every day | CUTANEOUS | 3 refills | Status: DC

## 2022-04-22 ENCOUNTER — Encounter: Payer: Self-pay | Admitting: Legal Medicine

## 2022-04-22 ENCOUNTER — Ambulatory Visit (INDEPENDENT_AMBULATORY_CARE_PROVIDER_SITE_OTHER): Payer: Medicare Other | Admitting: Legal Medicine

## 2022-04-22 VITALS — BP 144/70 | HR 84 | Temp 97.2°F | Resp 18 | Ht 64.0 in | Wt 259.0 lb

## 2022-04-22 DIAGNOSIS — G4733 Obstructive sleep apnea (adult) (pediatric): Secondary | ICD-10-CM

## 2022-04-22 DIAGNOSIS — R600 Localized edema: Secondary | ICD-10-CM | POA: Diagnosis not present

## 2022-04-22 DIAGNOSIS — Z6841 Body Mass Index (BMI) 40.0 and over, adult: Secondary | ICD-10-CM | POA: Diagnosis not present

## 2022-04-22 DIAGNOSIS — I872 Venous insufficiency (chronic) (peripheral): Secondary | ICD-10-CM | POA: Diagnosis not present

## 2022-04-22 DIAGNOSIS — R300 Dysuria: Secondary | ICD-10-CM | POA: Diagnosis not present

## 2022-04-22 LAB — POCT URINALYSIS DIP (CLINITEK)
Bilirubin, UA: NEGATIVE
Blood, UA: NEGATIVE
Glucose, UA: >=1000 mg/dL — AB
Ketones, POC UA: NEGATIVE mg/dL
Nitrite, UA: NEGATIVE
Spec Grav, UA: 1.01 (ref 1.010–1.025)
Urobilinogen, UA: 0.2 E.U./dL
pH, UA: 6 (ref 5.0–8.0)

## 2022-04-22 MED ORDER — FUROSEMIDE 20 MG PO TABS: 20.0000 mg | ORAL_TABLET | Freq: Two times a day (BID) | ORAL | 3 refills | Status: DC

## 2022-04-22 NOTE — Progress Notes (Signed)
Subjective:  Patient ID: Alicia Kelly, female    DOB: 1956-02-06  Age: 66 y.o. MRN: [id number]  Chief Complaint  Patient presents with   Leg Swelling    HPI  Patient states that swelling of her legs have been going on for about 2 months.  Patient concerned she may have a blood clot per her family. No SOB, no PND of chest pain.  No pain in calves but concerned for DVT  Current Outpatient Medications on File Prior to Visit  Medication Sig Dispense Refill   Accu-Chek Softclix Lancets lancets TEST BLOOD SUGAR THREE TIMES DAILY 300 each 1   atorvastatin (LIPITOR) 40 MG tablet Take 1 tablet (40 mg total) by mouth daily. 90 tablet 2   B-D UF III MINI PEN NEEDLES 31G X 5 MM MISC USE 1 PEN NEEDLE DAILY 100 each 2   Blood Glucose Calibration (ACCU-CHEK AVIVA) SOLN      Blood Glucose Monitoring Suppl (ACCU-CHEK AVIVA PLUS) w/Device KIT 1 each by Does not apply route 3 (three) times daily. 1 kit 0   clotrimazole-betamethasone (LOTRISONE) cream Apply 1 Application topically daily. 30 g 3   cyclobenzaprine (FLEXERIL) 10 MG tablet Take 1 tablet (10 mg total) by mouth 3 (three) times daily as needed for muscle spasms. 30 tablet 0   fluticasone (FLONASE) 50 MCG/ACT nasal spray Place 2 sprays into both nostrils daily.     glucose blood (ACCU-CHEK GUIDE) test strip Check sugars threes times daily 300 each 3   Insulin Pen Needle (NOVOFINE PEN NEEDLE) 32G X 6 MM MISC 1 each by Does not apply route every 7 (seven) days. 50 each 3   lacosamide (VIMPAT) 200 MG TABS tablet Take 1.5 tablets (300 mg total) by mouth 2 (two) times daily. 180 tablet 2   levETIRAcetam (KEPPRA) 1000 MG tablet Take 1 tablet (1,000 mg total) by mouth in the morning, at noon, and at bedtime. 90 tablet 6   losartan-hydrochlorothiazide (HYZAAR) 100-25 MG tablet Take 1 tablet by mouth daily. 90 tablet 2   montelukast (SINGULAIR) 10 MG tablet Take 1 tablet (10 mg total) by mouth daily. as directed 90 tablet 2   pantoprazole (PROTONIX)  40 MG tablet Take 1 tablet (40 mg total) by mouth 2 (two) times daily. 180 tablet 1   Semaglutide, 1 MG/DOSE, (OZEMPIC, 1 MG/DOSE,) 4 MG/3ML SOPN Inject 1 mg into the skin once a week. 3 mL 0   terbinafine (LAMISIL) 250 MG tablet Take 1 tablet (250 mg total) by mouth daily. 30 tablet 0   TRESIBA FLEXTOUCH 100 UNIT/ML FlexTouch Pen Inject 30 Units into the skin daily. 3 mL 0   No current facility-administered medications on file prior to visit.   Past Medical History:  Diagnosis Date   Asthma    Calculus of gallbladder with chronic cholecystitis without obstruction 03/22/2017   Diabetes ([place])    Epilepsy ([place])    Hypertension    Sleep apnea    Past Surgical History:  Procedure Laterality Date   APPENDECTOMY     TONSILLECTOMY     TONSILLECTOMY      Family History  Problem Relation Age of Onset   Colon cancer Mother        d. 33   Hypertension Father    Diabetes Father    Allergic rhinitis Sister    Hypertension Sister    Colon cancer Maternal Grandmother        dx 51s   Colon cancer Paternal Grandmother  dx 30s   Diabetes Paternal Grandmother    Cancer Maternal Uncle    Breast cancer Neg Hx    Social History   Socioeconomic History   Marital status: Married    Spouse name: Not on file   Number of children: Not on file   Years of education: Not on file   Highest education level: Not on file  Occupational History   Not on file  Tobacco Use   Smoking status: Never   Smokeless tobacco: Never  Vaping Use   Vaping Use: Never used  Substance and Sexual Activity   Alcohol use: Not Currently   Drug use: Never   Sexual activity: Not on file  Other Topics Concern   Not on file  Social History Narrative   Not on file   Social Determinants of Health   Financial Resource Strain: Low Risk  (09/10/2021)   Overall Financial Resource Strain (CARDIA)    Difficulty of Paying Living Expenses: Not hard at all  Food Insecurity: No Food Insecurity (08/22/2020)   Hunger  Vital Sign    Worried About Running Out of Food in the Last Year: Never true    [place] in the Last Year: Never true  Transportation Needs: No Transportation Needs (09/10/2021)   PRAPARE - Hydrologist (Medical): No    Lack of Transportation (Non-Medical): No  Physical Activity: Insufficiently Active (11/04/2020)   Exercise Vital Sign    Days of Exercise per Week: 2 days    Minutes of Exercise per Session: 20 min  Stress: No Stress Concern Present (11/04/2020)   [place]    Feeling of Stress : Not at all  Social Connections: Moderately Isolated (11/04/2020)   Social Connection and Isolation Panel [NHANES]    Frequency of Communication with Friends and Family: More than three times a week    Frequency of Social Gatherings with Friends and Family: Never    Attends Religious Services: Never    Marine scientist or Organizations: No    Attends Archivist Meetings: Never    Marital Status: Married    Review of Systems  Constitutional:  Negative for chills and fatigue.  HENT:  Negative for congestion, ear pain, sinus pain and sore throat.   Respiratory:  Negative for cough and shortness of breath.   Cardiovascular:  Negative for chest pain and leg swelling.  Gastrointestinal:  Negative for abdominal pain, constipation, diarrhea, nausea and vomiting.  Endocrine: Negative for polyuria.  Genitourinary:  Negative for dysuria and frequency.  Musculoskeletal:  Negative for arthralgias, back pain and myalgias.  Skin: Negative.   Neurological:  Negative for dizziness and headaches.  Psychiatric/Behavioral: Negative.       Objective:  BP (!) 144/70   Pulse 84   Temp (!) 97.2 F (36.2 C)   Resp 18   Ht _0  (1.626 m)   Wt 259 lb (117.5 kg)   LMP  (LMP Unknown)   SpO2 99%   BMI 44.46 kg/m      04/22/2022    9:24 AM 03/17/2022    3:10 PM 03/02/2022   12:31 PM   BP/Weight  Systolic BP 161 096 045  Diastolic BP 70 80 72  Wt. (Lbs) 259 260   BMI 44.46 kg/m2 44.63 kg/m2     Physical Exam Vitals reviewed.  Constitutional:      General: She is not in acute distress.  Appearance: Normal appearance. She is obese.  HENT:     Head: Normocephalic.     Nose: Nose normal.     Mouth/Throat:     Mouth: Mucous membranes are dry.  Cardiovascular:     Rate and Rhythm: Normal rate and regular rhythm.     Pulses: Normal pulses.     Heart sounds: Normal heart sounds. No murmur heard.    No gallop.  Pulmonary:     Effort: Pulmonary effort is normal. No respiratory distress.     Breath sounds: Normal breath sounds. No wheezing.  Abdominal:     General: Bowel sounds are normal. There is distension.  Musculoskeletal:     Cervical back: Normal range of motion.     Right lower leg: Edema (4+) present.     Left lower leg: Edema (4+) present.  Skin:    Capillary Refill: Capillary refill takes less than 2 seconds.  Neurological:     General: No focal deficit present.     Mental Status: She is alert and oriented to person, place, and time. Mental status is at baseline.         Lab Results  Component Value Date   WBC 12.7 02/09/2022   HGB 11.3 (A) 02/09/2022   HCT 34 (A) 02/09/2022   PLT 458 (A) 02/09/2022   GLUCOSE 170 (H) 01/28/2022   CHOL 146 01/28/2022   TRIG 157 (H) 01/28/2022   HDL 52 01/28/2022   LDLCALC 67 01/28/2022   ALT 19 01/28/2022   AST 18 01/28/2022   NA 140 01/28/2022   K 4.3 01/28/2022   CL 100 01/28/2022   CREATININE 0.87 01/28/2022   BUN 13 01/28/2022   CO2 23 01/28/2022   TSH 1.170 06/05/2021   HGBA1C 7.5 (H) 01/28/2022   MICROALBUR 10 07/03/2020      Assessment & Plan:   Problem List Items Addressed This Visit       Cardiovascular and Mediastinum   Chronic venous insufficiency   Relevant Medications   furosemide (LASIX) 20 MG tablet   Other Relevant Orders   VAS [place] LOWER EXTREMITY VENOUS (DVT)  (Completed)   D-dimer, quantitative Both legs swollen, no tenderness but patient worried about DVT, get venous dopplers     Respiratory   Sleep apnea - Primary Using CPAP consistently every night and medically benefiting from its use.      Other   BMI 40.0-44.9, adult (HCC) BMI < 40 meets criteria for morbid obesity    Morbid obesity ([place]) Patient has morbid obesity    Pedal edema   Relevant Medications   furosemide (LASIX) 20 MG tablet Increase lasix to bid for one week   Other Visit Diagnoses     Burning with urination       Relevant Orders   POCT URINALYSIS DIP (CLINITEK) (Completed)   Urine Culture (Completed) Negative culture     .  Meds ordered this encounter  Medications   furosemide (LASIX) 20 MG tablet    Sig: Take 1 tablet (20 mg total) by mouth 2 (two) times daily.    Dispense:  30 tablet    Refill:  3    Orders Placed This Encounter  Procedures   Urine Culture   D-dimer, quantitative   POCT URINALYSIS DIP (CLINITEK)   VAS [place] LOWER EXTREMITY VENOUS (DVT)     Follow-up: Return in about 2 weeks (around 05/06/2022).  An After Visit Summary was printed and given to the patient.  Reinaldo Meeker, MD [facility] [phone]

## 2022-04-23 DIAGNOSIS — R600 Localized edema: Secondary | ICD-10-CM | POA: Diagnosis not present

## 2022-04-24 ENCOUNTER — Other Ambulatory Visit: Payer: Self-pay

## 2022-04-24 DIAGNOSIS — I872 Venous insufficiency (chronic) (peripheral): Secondary | ICD-10-CM

## 2022-04-24 LAB — URINE CULTURE

## 2022-04-24 NOTE — Progress Notes (Signed)
Urine culture negative ?lp

## 2022-04-24 NOTE — Telephone Encounter (Signed)
Patient called to make [place] aware that she did get a call from Neurology about the [place] results.  They are not going to make any changes at this time.

## 2022-04-25 ENCOUNTER — Other Ambulatory Visit: Payer: Self-pay | Admitting: Legal Medicine

## 2022-04-25 DIAGNOSIS — E1159 Type 2 diabetes mellitus with other circulatory complications: Secondary | ICD-10-CM

## 2022-04-25 MED ORDER — OZEMPIC (1 MG/DOSE) 4 MG/3ML ~~LOC~~ SOPN: 1.0000 mg | PEN_INJECTOR | SUBCUTANEOUS | 3 refills | Status: DC

## 2022-04-28 LAB — D-DIMER, QUANTITATIVE: D-DIMER: 0.56 mg/L FEU — ABNORMAL HIGH (ref 0.00–0.49)

## 2022-04-28 NOTE — Progress Notes (Signed)
D-dimer slightly elevated, need venous doppler report. lp

## 2022-05-07 DIAGNOSIS — M5136 Other intervertebral disc degeneration, lumbar region: Secondary | ICD-10-CM | POA: Diagnosis not present

## 2022-05-08 ENCOUNTER — Telehealth: Payer: Self-pay

## 2022-05-08 NOTE — Telephone Encounter (Signed)
Patient called concerned to her blood sugar has been high after she started Furosemide. She states her blood sugar has been around 200's mg/dl. Please advice.

## 2022-05-11 NOTE — Progress Notes (Deleted)
[facility]  [address] [place],  [place]  [address] [phone]  Clinic Day:  02/09/2022  Referring physician: Lillard Anes,*   HISTORY OF PRESENT ILLNESS:  The patient is a 66 y.o. female  who I was asked to re-evaluate for leukocytosis.  She was seen by me for the same concern in June 2022; however, at that time, her white count came back normal at 9.7.  With respect to her recent leukocytosis, the patient claims she was recently found to have a yeast infection, for which she has been on therapy for the past week to treat.  Furthermore, she complains of a wound on her bottom that is yet to heal due to her suboptimally controlled diabetes.  She denies having any B symptoms or lymphadenopathy which concerns her for leukocytosis being due to to an underlying hematologic malignancy.  Outside of her nagging infections, she denies having any significant changes in her health.  PHYSICAL EXAM:  There were no vitals taken for this visit. Wt Readings from Last 3 Encounters:  04/22/22 259 lb (117.5 kg)  03/17/22 260 lb (117.9 kg)  02/27/22 250 lb 1.9 oz (113.5 kg)   There is no height or weight on file to calculate BMI. Performance status (ECOG): 2 - Symptomatic, <50% confined to bed Physical Exam Constitutional:      Appearance: Normal appearance. She is not ill-appearing.  HENT:     Mouth/Throat:     Mouth: Mucous membranes are moist.     Pharynx: Oropharynx is clear. No oropharyngeal exudate or posterior oropharyngeal erythema.  Cardiovascular:     Rate and Rhythm: Normal rate and regular rhythm.     Heart sounds: No murmur heard.    No friction rub. No gallop.  Pulmonary:     Effort: Pulmonary effort is normal. No respiratory distress.     Breath sounds: Normal breath sounds. No wheezing, rhonchi or rales.  Abdominal:     General: Bowel sounds are normal. There is no distension.     Palpations: Abdomen is soft. There is no mass.      Tenderness: There is no abdominal tenderness.  Musculoskeletal:        General: No swelling.     Right lower leg: No edema.     Left lower leg: No edema.  Lymphadenopathy:     Cervical: No cervical adenopathy.     Upper Body:     Right upper body: No supraclavicular or axillary adenopathy.     Left upper body: No supraclavicular or axillary adenopathy.     Lower Body: No right inguinal adenopathy. No left inguinal adenopathy.  Skin:    General: Skin is warm.     Coloration: Skin is not jaundiced.     Findings: No lesion or rash.  Neurological:     General: No focal deficit present.     Mental Status: She is alert and oriented to person, place, and time. Mental status is at baseline.  Psychiatric:        Mood and Affect: Mood normal.        Behavior: Behavior normal.        Thought Content: Thought content normal.    LABS:    Latest Reference Range & Units 02/09/22 11:56  Iron 28 - 170 ug/dL 52  UIBC ug/dL 349  TIBC 250 - 450 ug/dL 401  Saturation Ratios 10.4 - 31.8 % 13  Ferritin 11 - 307 ng/mL 12  Folate >5.9 ng/mL  6.9  Vitamin B12 180 - 914 pg/mL 216   ASSESSMENT & PLAN:  A 66 y.o. female who I was asked to reevaluate for leukocytosis. Her white count of 12.7 is only minimally elevated.  As mentioned previously, she is dealing with a yeast infection, as well as a poorly healing wound on her buttocks.  Such mild infections can lead to mild leukocytosis.  There is nothing per history or physical exam to suggest a more ominous disease process is present.  When evaluating her other peripheral counts, she has both anemia and thrombocythemia.  When factoring these results in with her suboptimal iron studies, I do believe a component of iron deficiency anemia is present.  Based upon this, I will arrange for her to receive IV iron over these next few weeks to replenish her iron stores and normalize her hemoglobin/platelets.  I will see her back in 3 months for repeat clinical  assessment.  The patient understands all the plans discussed today and is in agreement with them.   Jadore Mcguffin Macarthur Critchley, MD

## 2022-05-12 ENCOUNTER — Ambulatory Visit: Payer: Medicare Other | Admitting: Oncology

## 2022-05-12 ENCOUNTER — Other Ambulatory Visit: Payer: Medicare Other

## 2022-05-12 DIAGNOSIS — U071 COVID-19: Secondary | ICD-10-CM | POA: Diagnosis not present

## 2022-05-12 DIAGNOSIS — R059 Cough, unspecified: Secondary | ICD-10-CM | POA: Diagnosis not present

## 2022-05-14 NOTE — Telephone Encounter (Signed)
Patient Made Aware, Verbalized Understanding. Patient stated she went to urgent care and has Covid and since she been sick her sugars has been doing good not running over 140s.  Dr. Tobie Poet made aware, stated that she can stay at 1 mg weekly and after she feels better if her sugars go back up to 200s she will than increase to 2 mg weekly. He sugars could be low now due to her being sick and not eating much.

## 2022-05-21 ENCOUNTER — Other Ambulatory Visit: Payer: Self-pay

## 2022-05-21 DIAGNOSIS — E782 Mixed hyperlipidemia: Secondary | ICD-10-CM

## 2022-05-21 DIAGNOSIS — I10 Essential (primary) hypertension: Secondary | ICD-10-CM

## 2022-05-21 DIAGNOSIS — T7840XD Allergy, unspecified, subsequent encounter: Secondary | ICD-10-CM

## 2022-05-21 MED ORDER — LOSARTAN POTASSIUM-HCTZ 100-25 MG PO TABS: 1.0000 | ORAL_TABLET | Freq: Every day | ORAL | 2 refills | Status: DC

## 2022-05-21 MED ORDER — ATORVASTATIN CALCIUM 40 MG PO TABS: 40.0000 mg | ORAL_TABLET | Freq: Every day | ORAL | 0 refills | Status: DC

## 2022-05-21 MED ORDER — MONTELUKAST SODIUM 10 MG PO TABS: 10.0000 mg | ORAL_TABLET | Freq: Every day | ORAL | 2 refills | Status: DC

## 2022-05-27 NOTE — Progress Notes (Unsigned)
[facility]  [address] [place],    [address] [phone]  Clinic Day:  05/28/2022  Referring physician: Lillard Anes,*   HISTORY OF PRESENT ILLNESS:  The patient is a 67 y.o. female  with leukocytosis that was due to previous wound infections.  Furthermore, recent labs showed evidence of iron deficiency anemia.  The  patient recently received IV iron to replenish her iron stores and normalize her hemoglobin.  She comes in today to reassess her peripheral counts.  Overall, the patient claims to be doing fairly well.  She still has a buttocks lesion that is slow to heal.  She readily admits that her diabetes is not well-controlled.  However, as a pertains to her iron deficiency anemia, she denies having increased fatigue or any overt forms of blood loss.  PHYSICAL EXAM:  Blood pressure (!) 172/81, pulse 81, temperature 98.7 F (37.1 C), resp. rate 16, height '5\' 4"'$  (1.626 m), weight 257 lb 3.2 oz (116.7 kg), SpO2 97 %. Wt Readings from Last 3 Encounters:  05/28/22 257 lb 3.2 oz (116.7 kg)  04/22/22 259 lb (117.5 kg)  03/17/22 260 lb (117.9 kg)   Body mass index is 44.15 kg/m. Performance status (ECOG): 2 - Symptomatic, <50% confined to bed Physical Exam Constitutional:      Appearance: Normal appearance. She is not ill-appearing.  HENT:     Mouth/Throat:     Mouth: Mucous membranes are moist.     Pharynx: Oropharynx is clear. No oropharyngeal exudate or posterior oropharyngeal erythema.  Cardiovascular:     Rate and Rhythm: Normal rate and regular rhythm.     Heart sounds: No murmur heard.    No friction rub. No gallop.  Pulmonary:     Effort: Pulmonary effort is normal. No respiratory distress.     Breath sounds: Normal breath sounds. No wheezing, rhonchi or rales.  Abdominal:     General: Bowel sounds are normal. There is no distension.     Palpations: Abdomen is soft. There is no mass.     Tenderness: There is no  abdominal tenderness.  Musculoskeletal:        General: No swelling.     Right lower leg: No edema.     Left lower leg: No edema.  Lymphadenopathy:     Cervical: No cervical adenopathy.     Upper Body:     Right upper body: No supraclavicular or axillary adenopathy.     Left upper body: No supraclavicular or axillary adenopathy.     Lower Body: No right inguinal adenopathy. No left inguinal adenopathy.  Skin:    General: Skin is warm.     Coloration: Skin is not jaundiced.     Findings: No lesion or rash.  Neurological:     General: No focal deficit present.     Mental Status: She is alert and oriented to person, place, and time. Mental status is at baseline.  Psychiatric:        Mood and Affect: Mood normal.        Behavior: Behavior normal.        Thought Content: Thought content normal.   LABS:     Latest Reference Range & Units 05/28/22 09:05  Iron 28 - 170 ug/dL 53  UIBC ug/dL 324  TIBC 250 - 450 ug/dL 377  Saturation Ratios 10.4 - 31.8 % 14  Ferritin 11 - 307 ng/mL 27   ASSESSMENT & PLAN:  A 67 y.o. female who  I follow for leukocytosis and iron deficiency anemia.  Her hemoglobin is slightly lower today than what it was after receiving IV iron.  However, her iron parameters are better than what they have been previously.  It very well could be that her anemia may be due to chronic issues, including her diabetes.  I am pleased as her white count has returned to normal.  Overall, there was nothing which concerned me for her leukocytosis being due to something ominous.  For now, the patient will be followed conservatively over these next few months.  I will see her back in 4 months for repeat clinical assessment.  The patient understands all the plans discussed today and is in agreement with them.   Wally Behan Macarthur Critchley, MD

## 2022-05-28 ENCOUNTER — Telehealth: Payer: Self-pay | Admitting: Oncology

## 2022-05-28 ENCOUNTER — Other Ambulatory Visit: Payer: Self-pay | Admitting: Oncology

## 2022-05-28 ENCOUNTER — Inpatient Hospital Stay (INDEPENDENT_AMBULATORY_CARE_PROVIDER_SITE_OTHER): Payer: 59 | Admitting: Oncology

## 2022-05-28 ENCOUNTER — Inpatient Hospital Stay: Payer: 59 | Attending: Oncology

## 2022-05-28 ENCOUNTER — Other Ambulatory Visit: Payer: Self-pay

## 2022-05-28 VITALS — BP 172/81 | HR 81 | Temp 98.7°F | Resp 16 | Ht 64.0 in | Wt 257.2 lb

## 2022-05-28 DIAGNOSIS — D508 Other iron deficiency anemias: Secondary | ICD-10-CM | POA: Diagnosis not present

## 2022-05-28 DIAGNOSIS — D509 Iron deficiency anemia, unspecified: Secondary | ICD-10-CM

## 2022-05-28 DIAGNOSIS — D649 Anemia, unspecified: Secondary | ICD-10-CM | POA: Diagnosis not present

## 2022-05-28 DIAGNOSIS — D72829 Elevated white blood cell count, unspecified: Secondary | ICD-10-CM

## 2022-05-28 DIAGNOSIS — E669 Obesity, unspecified: Secondary | ICD-10-CM

## 2022-05-28 LAB — BASIC METABOLIC PANEL
BUN: 23 — AB (ref 4–21)
CO2: 25 — AB (ref 13–22)
Chloride: 102 (ref 99–108)
Creatinine: 0.8 (ref 0.5–1.1)
Glucose: 283
Potassium: 3.7 mEq/L (ref 3.5–5.1)
Sodium: 136 — AB (ref 137–147)

## 2022-05-28 LAB — COMPREHENSIVE METABOLIC PANEL
Albumin: 3.9 (ref 3.5–5.0)
Calcium: 9.3 (ref 8.7–10.7)

## 2022-05-28 LAB — IRON AND TIBC
Iron: 53 ug/dL (ref 28–170)
Saturation Ratios: 14 % (ref 10.4–31.8)
TIBC: 377 ug/dL (ref 250–450)
UIBC: 324 ug/dL

## 2022-05-28 LAB — HEPATIC FUNCTION PANEL
ALT: 29 U/L (ref 7–35)
AST: 40 — AB (ref 13–35)
Alkaline Phosphatase: 67 (ref 25–125)
Bilirubin, Total: 0.6

## 2022-05-28 LAB — CBC AND DIFFERENTIAL
HCT: 33 — AB (ref 36–46)
Hemoglobin: 10.9 — AB (ref 12.0–16.0)
Neutrophils Absolute: 6.21
Platelets: 471 10*3/uL — AB (ref 150–400)
WBC: 8.5

## 2022-05-28 LAB — CBC: RBC: 3.6 — AB (ref 3.87–5.11)

## 2022-05-28 LAB — FERRITIN: Ferritin: 27 ng/mL (ref 11–307)

## 2022-05-28 MED ORDER — TRESIBA FLEXTOUCH 100 UNIT/ML ~~LOC~~ SOPN: 30.0000 [IU] | PEN_INJECTOR | Freq: Every day | SUBCUTANEOUS | 2 refills | Status: DC

## 2022-05-28 NOTE — Telephone Encounter (Signed)
05/28/22 Next appt scheduled and confirmed with patient 

## 2022-06-03 ENCOUNTER — Other Ambulatory Visit: Payer: Self-pay

## 2022-06-03 DIAGNOSIS — B354 Tinea corporis: Secondary | ICD-10-CM

## 2022-06-03 MED ORDER — CLOTRIMAZOLE-BETAMETHASONE 1-0.05 % EX CREA: 1.0000 | TOPICAL_CREAM | Freq: Every day | CUTANEOUS | 3 refills | Status: DC

## 2022-06-11 ENCOUNTER — Encounter: Payer: Self-pay | Admitting: Oncology

## 2022-06-12 DIAGNOSIS — J019 Acute sinusitis, unspecified: Secondary | ICD-10-CM | POA: Diagnosis not present

## 2022-06-14 DIAGNOSIS — N390 Urinary tract infection, site not specified: Secondary | ICD-10-CM | POA: Diagnosis not present

## 2022-06-14 DIAGNOSIS — R350 Frequency of micturition: Secondary | ICD-10-CM | POA: Diagnosis not present

## 2022-06-18 ENCOUNTER — Ambulatory Visit (INDEPENDENT_AMBULATORY_CARE_PROVIDER_SITE_OTHER): Payer: 59 | Admitting: Physician Assistant

## 2022-06-18 ENCOUNTER — Encounter: Payer: Self-pay | Admitting: Physician Assistant

## 2022-06-18 VITALS — BP 128/70 | HR 74 | Temp 97.2°F | Resp 18 | Ht 64.0 in | Wt 233.0 lb

## 2022-06-18 DIAGNOSIS — G40309 Generalized idiopathic epilepsy and epileptic syndromes, not intractable, without status epilepticus: Secondary | ICD-10-CM

## 2022-06-18 DIAGNOSIS — N3000 Acute cystitis without hematuria: Secondary | ICD-10-CM | POA: Diagnosis not present

## 2022-06-18 DIAGNOSIS — Z794 Long term (current) use of insulin: Secondary | ICD-10-CM

## 2022-06-18 DIAGNOSIS — E782 Mixed hyperlipidemia: Secondary | ICD-10-CM

## 2022-06-18 DIAGNOSIS — I10 Essential (primary) hypertension: Secondary | ICD-10-CM

## 2022-06-18 DIAGNOSIS — Z6841 Body Mass Index (BMI) 40.0 and over, adult: Secondary | ICD-10-CM

## 2022-06-18 DIAGNOSIS — E119 Type 2 diabetes mellitus without complications: Secondary | ICD-10-CM

## 2022-06-18 DIAGNOSIS — G4733 Obstructive sleep apnea (adult) (pediatric): Secondary | ICD-10-CM

## 2022-06-18 DIAGNOSIS — K2100 Gastro-esophageal reflux disease with esophagitis, without bleeding: Secondary | ICD-10-CM

## 2022-06-18 DIAGNOSIS — R600 Localized edema: Secondary | ICD-10-CM

## 2022-06-18 DIAGNOSIS — Z6839 Body mass index (BMI) 39.0-39.9, adult: Secondary | ICD-10-CM

## 2022-06-18 DIAGNOSIS — Z23 Encounter for immunization: Secondary | ICD-10-CM | POA: Diagnosis not present

## 2022-06-18 LAB — POCT URINALYSIS DIP (CLINITEK)
Bilirubin, UA: NEGATIVE
Blood, UA: NEGATIVE
Glucose, UA: 250 mg/dL — AB
Ketones, POC UA: NEGATIVE mg/dL
Leukocytes, UA: NEGATIVE
Nitrite, UA: NEGATIVE
POC PROTEIN,UA: NEGATIVE
Spec Grav, UA: 1.015 (ref 1.010–1.025)
Urobilinogen, UA: 0.2 E.U./dL
pH, UA: 6 (ref 5.0–8.0)

## 2022-06-18 NOTE — Progress Notes (Signed)
Established Patient Office Visit  Subjective:  Patient ID: Alicia Kelly, female    DOB: 1955/06/19  Age: 67 y.o. MRN: [id number]  CC:  Chief Complaint  Patient presents with   Diabetes   Hypertension   PT NEW TO ME - PRIOR PATIENT DR Henrene Pastor HPI Alicia Kelly presents for CHRONIC FOLLOW UP  Pt with history of IDDM for the past 10 years - currently she is taking Ozempic '1mg'$  weekly and Tresiba 30 units daily.  She states her glucose had been running randomly in the 140s but recently had uti/uri and glucose had been going up to 180s-200.  She does not check fasting glucose - checks a few hours after eating lunch.  Pt states she usually eats a small breakfast, lunch but then no dinner.  Recommend to not skip meals esp with her being on insulin.  She is due for eye exam and will schedule  Pt with history of GERD - stable on protonix '40mg'$  qd  Pt with history of hypertension for the past 10 years - she is currently on hyzaar 100/'25mg'$  qd and  lasix '20mg'$  bid .  She denies chest pain or shortness of breath but is still having some swelling in lower extremities despite the use of lasix.  Alicia Kelly this problem just started about 6 months ago  Pt with history of hyperlipidemia and currently on lipitor '40mg'$  qd - she is trying to watch her diet .  Is due for labwork  Pt with history of epilepsy.  She does follow with neurology regularly.  Currently taking keppra and vimpat.    Pt with history of iron deficiency anemia.  She is currently taking otc iron and following with Dr Bobby Rumpf - her last appt was in January and due to follow up in May  Pt was recently seen at urgent care for uti - she is currently taking cipro for this and states symptoms have improved   Pt would like flu vaccine and pneumo vaccine  Past Medical History:  Diagnosis Date   Asthma    Calculus of gallbladder with chronic cholecystitis without obstruction 03/22/2017   Diabetes ([place])    Epilepsy ([place])    Hypertension     Sleep apnea     Past Surgical History:  Procedure Laterality Date   APPENDECTOMY     TONSILLECTOMY     TONSILLECTOMY      Family History  Problem Relation Age of Onset   Colon cancer Mother        d. 58   Hypertension Father    Diabetes Father    Allergic rhinitis Sister    Hypertension Sister    Colon cancer Maternal Grandmother        dx 59s   Colon cancer Paternal Grandmother        dx 87s   Diabetes Paternal Grandmother    Cancer Maternal Uncle    Breast cancer Neg Hx     Social History   Socioeconomic History   Marital status: Married    Spouse name: Not on file   Number of children: Not on file   Years of education: Not on file   Highest education level: Not on file  Occupational History   Not on file  Tobacco Use   Smoking status: Never   Smokeless tobacco: Never  Vaping Use   Vaping Use: Never used  Substance and Sexual Activity   Alcohol use: Not Currently   Drug use: Never   Sexual  activity: Not on file  Other Topics Concern   Not on file  Social History Narrative   Not on file   Social Determinants of Health   Financial Resource Strain: Low Risk  (09/10/2021)   Overall Financial Resource Strain (CARDIA)    Difficulty of Paying Living Expenses: Not hard at all  Food Insecurity: No Food Insecurity (08/22/2020)   Hunger Vital Sign    Worried About Running Out of Food in the Last Year: Never true    Ran Out of Food in the Last Year: Never true  Transportation Needs: No Transportation Needs (09/10/2021)   PRAPARE - Hydrologist (Medical): No    Lack of Transportation (Non-Medical): No  Physical Activity: Insufficiently Active (11/04/2020)   Exercise Vital Sign    Days of Exercise per Week: 2 days    Minutes of Exercise per Session: 20 min  Stress: No Stress Concern Present (11/04/2020)   [place]    Feeling of Stress : Not at all  Social Connections:  Moderately Isolated (11/04/2020)   Social Connection and Isolation Panel [NHANES]    Frequency of Communication with Friends and Family: More than three times a week    Frequency of Social Gatherings with Friends and Family: Never    Attends Religious Services: Never    Marine scientist or Organizations: No    Attends Archivist Meetings: Never    Marital Status: Married  Human resources officer Violence: Not At Risk (11/04/2020)   Humiliation, Afraid, Rape, and Kick questionnaire    Fear of Current or Ex-Partner: No    Emotionally Abused: No    Physically Abused: No    Sexually Abused: No     Current Outpatient Medications:    Accu-Chek Softclix Lancets lancets, TEST BLOOD SUGAR THREE TIMES DAILY, Disp: 300 each, Rfl: 1   atorvastatin (LIPITOR) 40 MG tablet, Take 1 tablet (40 mg total) by mouth daily., Disp: 90 tablet, Rfl: 0   B-D UF III MINI PEN NEEDLES 31G X 5 MM MISC, USE 1 PEN NEEDLE DAILY, Disp: 100 each, Rfl: 2   Blood Glucose Calibration (ACCU-CHEK AVIVA) SOLN, , Disp: , Rfl:    Blood Glucose Monitoring Suppl (ACCU-CHEK AVIVA PLUS) w/Device KIT, 1 each by Does not apply route 3 (three) times daily., Disp: 1 kit, Rfl: 0   ciprofloxacin (CIPRO) 500 MG tablet, SMARTSIG:1 Tablet(s) By Mouth Every 12 Hours, Disp: , Rfl:    clotrimazole-betamethasone (LOTRISONE) cream, Apply 1 Application topically daily., Disp: 30 g, Rfl: 3   fluticasone (FLONASE) 50 MCG/ACT nasal spray, Place 2 sprays into both nostrils daily., Disp: , Rfl:    furosemide (LASIX) 20 MG tablet, Take 1 tablet (20 mg total) by mouth 2 (two) times daily., Disp: 30 tablet, Rfl: 3   glucose blood (ACCU-CHEK GUIDE) test strip, Check sugars threes times daily, Disp: 300 each, Rfl: 3   Insulin Pen Needle (NOVOFINE PEN NEEDLE) 32G X 6 MM MISC, 1 each by Does not apply route every 7 (seven) days., Disp: 50 each, Rfl: 3   lacosamide (VIMPAT) 200 MG TABS tablet, Take 1.5 tablets (300 mg total) by mouth 2 (two) times  daily., Disp: 180 tablet, Rfl: 2   levETIRAcetam (KEPPRA) 1000 MG tablet, Take 1 tablet (1,000 mg total) by mouth in the morning, at noon, and at bedtime., Disp: 90 tablet, Rfl: 6   losartan-hydrochlorothiazide (HYZAAR) 100-25 MG tablet, Take 1 tablet by mouth daily.,  Disp: 90 tablet, Rfl: 2   montelukast (SINGULAIR) 10 MG tablet, Take 1 tablet (10 mg total) by mouth daily. as directed, Disp: 90 tablet, Rfl: 2   pantoprazole (PROTONIX) 40 MG tablet, Take 1 tablet (40 mg total) by mouth 2 (two) times daily., Disp: 180 tablet, Rfl: 1   Semaglutide, 1 MG/DOSE, (OZEMPIC, 1 MG/DOSE,) 4 MG/3ML SOPN, Inject 1 mg into the skin once a week., Disp: 3 mL, Rfl: 3   TRESIBA FLEXTOUCH 100 UNIT/ML FlexTouch Pen, Inject 30 Units into the skin daily., Disp: 3 mL, Rfl: 2   Allergies  Allergen Reactions   Levofloxacin Other (See Comments)    Throat swelling   Lisinopril Hives   Nitrofurantoin Swelling    Throat swelling   Penicillins Anaphylaxis   Clarithromycin Other (See Comments)    seizures   Sulfamethoxazole-Trimethoprim     Causes seizures    ROS CONSTITUTIONAL: Negative for chills, fatigue, fever, unintentional weight gain and unintentional weight loss.  E/N/T: Negative for ear pain, nasal congestion and sore throat.  CARDIOVASCULAR: Negative for chest pain, dizziness, palpitations and pedal edema.  RESPIRATORY: Negative for recent cough and dyspnea.  GASTROINTESTINAL: Negative for abdominal pain, acid reflux symptoms, constipation, diarrhea, nausea and vomiting.  MSK: Negative for arthralgias and myalgias.  INTEGUMENTARY: Negative for rash.  NEUROLOGICAL: Negative for dizziness and headaches.  PSYCHIATRIC: Negative for sleep disturbance and to question depression screen.  Negative for depression, negative for anhedonia.        Objective:    PHYSICAL EXAM:   VS: BP 128/70   Pulse 74   Temp (!) 97.2 F (36.2 C)   Resp 18   Ht '5\' 4"'$  (1.626 m)   Wt 233 lb (105.7 kg)   LMP  (LMP  Unknown)   SpO2 97%   BMI 39.99 kg/m   GEN: Well nourished, well developed, in no acute distress  Cardiac: RRR; no murmurs, rubs, or gallops,minimal edema both lower extremities -- nonpitting Respiratory:  normal respiratory rate and pattern with no distress - normal breath sounds with no rales, rhonchi, wheezes or rubs MS: no deformity or atrophy  Skin: warm and dry, no rash  Psych: euthymic mood, appropriate affect and demeanor    Health Maintenance Due  Topic Date Due   Medicare Annual Wellness (AWV)  04/29/2018   Diabetic kidney evaluation - Urine ACR  06/05/2022    There are no preventive care reminders to display for this patient.  Lab Results  Component Value Date   TSH 1.170 06/05/2021   Lab Results  Component Value Date   WBC 8.5 05/28/2022   HGB 10.9 (A) 05/28/2022   HCT 33 (A) 05/28/2022   MCV 91 01/28/2022   PLT 471 (A) 05/28/2022   Lab Results  Component Value Date   NA 136 (A) 05/28/2022   K 3.7 05/28/2022   CO2 25 (A) 05/28/2022   GLUCOSE 170 (H) 01/28/2022   BUN 23 (A) 05/28/2022   CREATININE 0.8 05/28/2022   BILITOT 0.2 01/28/2022   ALKPHOS 67 05/28/2022   AST 40 (A) 05/28/2022   ALT 29 05/28/2022   PROT 6.7 01/28/2022   ALBUMIN 3.9 05/28/2022   CALCIUM 9.3 05/28/2022   EGFR 74 01/28/2022   Lab Results  Component Value Date   CHOL 146 01/28/2022   Lab Results  Component Value Date   HDL 52 01/28/2022   Lab Results  Component Value Date   LDLCALC 67 01/28/2022   Lab Results  Component Value Date  TRIG 157 (H) 01/28/2022   Lab Results  Component Value Date   CHOLHDL 2.8 01/28/2022   Lab Results  Component Value Date   HGBA1C 7.5 (H) 01/28/2022      Assessment & Plan:   Problem List Items Addressed This Visit       Cardiovascular and Mediastinum   Essential hypertension   Relevant Orders   CBC with Differential/Platelet   Comprehensive metabolic panel   TSH Continue current meds     Respiratory   Sleep  apnea Use CPAP as directed     Digestive   GERD (gastroesophageal reflux disease) Continue meds     Nervous and Auditory   Generalized epilepsy ([place]) Continue meds Follow up with neurologist as directed     Other   Hyperlipidemia - Primary   Relevant Orders   Comprehensive metabolic panel   Lipid panel Continue meds Diet modifications   BMI 40.0-44.9, adult (HCC) Diet modifications Increase physical activity   Pedal edema Lasix as directed Elevate legs Pro BNP   Other Visit Diagnoses     Acute cystitis without hematuria       Relevant Orders   POCT URINALYSIS DIP (CLINITEK) (Completed) Complete cipro as directed   Needs flu shot       Relevant Orders   Flu Vaccine QUAD High Dose(Fluad) (Completed)   Need for pneumococcal vaccine       Relevant Orders   Pneumococcal conjugate vaccine 20-valent (Prevnar 20) (Completed)   Insulin dependent type 2 diabetes mellitus ([place])       Relevant Orders   CBC with Differential/Platelet   Comprehensive metabolic panel   Hemoglobin A1c   Microalbumin / creatinine urine ratio Continue current meds Well balanced diet       No orders of the defined types were placed in this encounter.   Follow-up: Return in about 4 months (around 10/17/2022) for chronic fasting follow up.    SARA R Ramiah Helfrich, PA-C

## 2022-06-19 ENCOUNTER — Encounter: Payer: Self-pay | Admitting: Oncology

## 2022-06-21 LAB — COMPREHENSIVE METABOLIC PANEL
ALT: 16 IU/L (ref 0–32)
AST: 18 IU/L (ref 0–40)
Albumin/Globulin Ratio: 2 (ref 1.2–2.2)
Albumin: 4.1 g/dL (ref 3.9–4.9)
Alkaline Phosphatase: 78 IU/L (ref 44–121)
BUN/Creatinine Ratio: 23 (ref 12–28)
BUN: 17 mg/dL (ref 8–27)
Bilirubin Total: 0.2 mg/dL (ref 0.0–1.2)
CO2: 24 mmol/L (ref 20–29)
Calcium: 9.6 mg/dL (ref 8.7–10.3)
Chloride: 98 mmol/L (ref 96–106)
Creatinine, Ser: 0.74 mg/dL (ref 0.57–1.00)
Globulin, Total: 2.1 g/dL (ref 1.5–4.5)
Glucose: 249 mg/dL — ABNORMAL HIGH (ref 70–99)
Potassium: 4.4 mmol/L (ref 3.5–5.2)
Sodium: 138 mmol/L (ref 134–144)
Total Protein: 6.2 g/dL (ref 6.0–8.5)
eGFR: 90 mL/min/{1.73_m2} (ref >59)

## 2022-06-21 LAB — CBC WITH DIFFERENTIAL/PLATELET
Basophils Absolute: 0.1 10*3/uL (ref 0.0–0.2)
Basos: 1 %
EOS (ABSOLUTE): 0.3 10*3/uL (ref 0.0–0.4)
Eos: 3 %
Hematocrit: 32.7 % — ABNORMAL LOW (ref 34.0–46.6)
Hemoglobin: 10.5 g/dL — ABNORMAL LOW (ref 11.1–15.9)
Immature Grans (Abs): 0 10*3/uL (ref 0.0–0.1)
Immature Granulocytes: 0 %
Lymphocytes Absolute: 1.6 10*3/uL (ref 0.7–3.1)
Lymphs: 15 %
MCH: 29.7 pg (ref 26.6–33.0)
MCHC: 32.1 g/dL (ref 31.5–35.7)
MCV: 92 fL (ref 79–97)
Monocytes Absolute: 0.9 10*3/uL (ref 0.1–0.9)
Monocytes: 9 %
Neutrophils Absolute: 7.4 10*3/uL — ABNORMAL HIGH (ref 1.4–7.0)
Neutrophils: 72 %
Platelets: 529 10*3/uL — ABNORMAL HIGH (ref 150–450)
RBC: 3.54 x10E6/uL — ABNORMAL LOW (ref 3.77–5.28)
RDW: 12.8 % (ref 11.7–15.4)
WBC: 10.3 10*3/uL (ref 3.4–10.8)

## 2022-06-21 LAB — LIPID PANEL
Chol/HDL Ratio: 3 ratio (ref 0.0–4.4)
Cholesterol, Total: 118 mg/dL (ref 100–199)
HDL: 40 mg/dL (ref >39)
LDL Chol Calc (NIH): 49 mg/dL (ref 0–99)
Triglycerides: 172 mg/dL — ABNORMAL HIGH (ref 0–149)
VLDL Cholesterol Cal: 29 mg/dL (ref 5–40)

## 2022-06-21 LAB — MICROALBUMIN / CREATININE URINE RATIO
Creatinine, Urine: 91.4 mg/dL
Microalb/Creat Ratio: 4 mg/g creat (ref 0–29)
Microalbumin, Urine: 3.7 ug/mL

## 2022-06-21 LAB — TSH: TSH: 1.21 u[IU]/mL (ref 0.450–4.500)

## 2022-06-21 LAB — HEMOGLOBIN A1C
Est. average glucose Bld gHb Est-mCnc: 194 mg/dL
Hgb A1c MFr Bld: 8.4 % — ABNORMAL HIGH (ref 4.8–5.6)

## 2022-06-21 LAB — CARDIOVASCULAR RISK ASSESSMENT

## 2022-06-22 ENCOUNTER — Other Ambulatory Visit: Payer: Self-pay | Admitting: Physician Assistant

## 2022-06-22 DIAGNOSIS — E119 Type 2 diabetes mellitus without complications: Secondary | ICD-10-CM

## 2022-06-22 MED ORDER — SEMAGLUTIDE (2 MG/DOSE) 8 MG/3ML ~~LOC~~ SOPN: 2.0000 mg | PEN_INJECTOR | SUBCUTANEOUS | 5 refills | Status: DC

## 2022-06-23 LAB — SPECIMEN STATUS REPORT

## 2022-06-23 LAB — PRO B NATRIURETIC PEPTIDE: NT-Pro BNP: 38 pg/mL (ref 0–301)

## 2022-07-02 ENCOUNTER — Other Ambulatory Visit: Payer: Self-pay

## 2022-07-02 DIAGNOSIS — R600 Localized edema: Secondary | ICD-10-CM

## 2022-07-02 MED ORDER — FUROSEMIDE 20 MG PO TABS: 20.0000 mg | ORAL_TABLET | Freq: Two times a day (BID) | ORAL | 3 refills | Status: DC

## 2022-07-06 ENCOUNTER — Telehealth: Payer: Self-pay

## 2022-07-06 NOTE — Telephone Encounter (Signed)
Philipp Ovens, CMA: I have put patient on to see Palma Holter Wednesday    [name] PA-C: yes we did - we spoke to pharmacy and they said she had several refills - she needs to ask them     Philipp Ovens, CMA:  She can't come Thursday, it has to be in the morning they have to pick child up from school. I have requested records and sent message to front staff to please look for them She said you was going to call in a cream the last time she seen you but she never got medication     [name] PA-C: sounds like she may need unna boots ---- I just did a Pro BNP on her to check to heart failure and it was normal so no rush on getting into cardiology but will refer when she gets in for visit - how bad are her legs now and let me check schedule  10:14 AM I have 38mn on Thursday afternoon if she can wait til then --- if she is having chest pain or shortness of breath recommend to go to ED however  10:15 AM SD how bad are her legs? - wonder why they didn't do anything --- can you get urgent care records sent so I can review?     JPhilipp Ovens CMA: No treatment was given just to see her PCP Monday morning. Nobody has anything open, I will see if I can get urgent care notes, she told me they just told her she needs to see her PCP to get in in to see heart doctor    Per SMarge DuncansPA-C: did the urgent care do any kind of treatment for her? - she is new to me - does anybody else have any openings?     patient stated that she went to urgent care Friday, due to her legs getting blisters and they are raw. Patient stated that urgent care told her to follow up with her PCP the blister is due to the swelling in her legs and come from veins holding water. you don't have anything open till Wednesday but she is extra time. Please advise.

## 2022-07-07 ENCOUNTER — Ambulatory Visit (INDEPENDENT_AMBULATORY_CARE_PROVIDER_SITE_OTHER): Payer: 59

## 2022-07-07 DIAGNOSIS — Z0000 Encounter for general adult medical examination without abnormal findings: Secondary | ICD-10-CM

## 2022-07-07 NOTE — Patient Instructions (Signed)
[name] , Thank you for taking time to come for your Medicare Wellness Visit. I appreciate your ongoing commitment to your health goals. Please review the following plan we discussed and let me know if I can assist you in the future.   Screening recommendations/referrals: Colonoscopy: Due 2026 Mammogram: Ordered Bone Density: Ordered Recommended yearly ophthalmology/optometry visit for glaucoma screening and checkup Recommended yearly dental visit for hygiene and checkup  Vaccinations: Influenza vaccine: up-to-date Pneumococcal vaccine: up-to-date Tdap vaccine: Due 2025 Shingles vaccine: Due - you can get this at the pharmacy    Advanced directives: Information given   Preventive Care 65 Years and Older, Female   Preventive care refers to lifestyle choices and visits with your health care provider that can promote health and wellness.  What does preventive care include? A yearly physical exam. This is also called an annual well check. Dental exams once or twice a year. Routine eye exams. Ask your health care provider how often you should have your eyes checked. Personal lifestyle choices, including: Daily care of your teeth and gums. Regular physical activity. Eating a healthy diet. Avoiding tobacco and drug use. Limiting alcohol use. Practicing safe sex. Taking low-dose aspirin every day. Taking vitamin and mineral supplements as recommended by your health care provider.  What happens during an annual well check? The services and screenings done by your health care provider during your annual well check will depend on your age, overall health, lifestyle risk factors, and family history of disease.  Counseling Your health care provider may ask you questions about your: Alcohol use. Tobacco use. Drug use. Emotional well-being. Home and relationship well-being. Sexual activity. Eating habits. History of falls. Memory and ability to understand (cognition). Work and  work Statistician. Reproductive health.  Screening You may have the following tests or measurements: Height, weight, and BMI. Blood pressure. Lipid and cholesterol levels. These may be checked every 5 years, or more frequently if you are over 53 years old. Skin check. Lung cancer screening. You may have this screening every year starting at age 34 if you have a 30-pack-year history of smoking and currently smoke or have quit within the past 15 years. Fecal occult blood test (FOBT) of the stool. You may have this test every year starting at age 42. Flexible sigmoidoscopy or colonoscopy. You may have a sigmoidoscopy every 5 years or a colonoscopy every 10 years starting at age 37. Hepatitis C blood test. Hepatitis B blood test. Sexually transmitted disease (STD) testing. Diabetes screening. This is done by checking your blood sugar (glucose) after you have not eaten for a while (fasting). You may have this done every 1-3 years. Bone density scan. This is done to screen for osteoporosis. You may have this done starting at age 41. Mammogram. This may be done every 1-2 years. Talk to your health care provider about how often you should have regular mammograms. Talk with your health care provider about your test results, treatment options, and if necessary, the need for more tests.  Vaccines Your health care provider may recommend certain vaccines, such as: Influenza vaccine. This is recommended every year. Tetanus, diphtheria, and acellular pertussis (Tdap, Td) vaccine. You may need a Td booster every 10 years. Zoster vaccine. You may need this after age 69. Pneumococcal 13-valent conjugate (PCV13) vaccine. One dose is recommended after age 38. Pneumococcal polysaccharide (PPSV23) vaccine. One dose is recommended after age 52. Talk to your health care provider about which screenings and vaccines you need and how often  you need them.  This information is not intended to replace advice given to  you by your health care provider. Make sure you discuss any questions you have with your health care provider. Document Released: 05/31/2015 Document Revised: 01/22/2016 Document Reviewed: 03/05/2015 Elsevier Interactive Patient Education  2017 [employer] Prevention in the Home  Falls can cause injuries. They can happen to people of all ages. There are many things you can do to make your home safe and to help prevent falls.  What can I do on the outside of my home? Regularly fix the edges of walkways and driveways and fix any cracks. Remove anything that might make you trip as you walk through a door, such as a raised step or threshold. Trim any bushes or trees on the path to your home. Use bright outdoor lighting. Clear any walking paths of anything that might make someone trip, such as rocks or tools. Regularly check to see if handrails are loose or broken. Make sure that both sides of any steps have handrails. Any raised decks and porches should have guardrails on the edges. Have any leaves, snow, or ice cleared regularly. Use sand or salt on walking paths during winter. Clean up any spills in your garage right away. This includes oil or grease spills.  What can I do in the bathroom? Use night lights. Install grab bars by the toilet and in the tub and shower. Do not use towel bars as grab bars. Use non-skid mats or decals in the tub or shower. If you need to sit down in the shower, use a plastic, non-slip stool. Keep the floor dry. Clean up any water that spills on the floor as soon as it happens. Remove soap buildup in the tub or shower regularly. Attach bath mats securely with double-sided non-slip rug tape. Do not have throw rugs and other things on the floor that can make you trip.  What can I do in the bedroom? Use night lights. Make sure that you have a light by your bed that is easy to reach. Do not use any sheets or blankets that are too big for your bed. They  should not hang down onto the floor. Have a firm chair that has side arms. You can use this for support while you get dressed. Do not have throw rugs and other things on the floor that can make you trip.  What can I do in the kitchen? Clean up any spills right away. Avoid walking on wet floors. Keep items that you use a lot in easy-to-reach places. If you need to reach something above you, use a strong step stool that has a grab bar. Keep electrical cords out of the way. Do not use floor polish or wax that makes floors slippery. If you must use wax, use non-skid floor wax. Do not have throw rugs and other things on the floor that can make you trip.  What can I do with my stairs? Do not leave any items on the stairs. Make sure that there are handrails on both sides of the stairs and use them. Fix handrails that are broken or loose. Make sure that handrails are as long as the stairways. Check any carpeting to make sure that it is firmly attached to the stairs. Fix any carpet that is loose or worn. Avoid having throw rugs at the top or bottom of the stairs. If you do have throw rugs, attach them to the floor with carpet  tape. Make sure that you have a light switch at the top of the stairs and the bottom of the stairs. If you do not have them, ask someone to add them for you.  What else can I do to help prevent falls? Wear shoes that: Do not have high heels. Have rubber bottoms. Are comfortable and fit you well. Are closed at the toe. Do not wear sandals. If you use a stepladder: Make sure that it is fully opened. Do not climb a closed stepladder. Make sure that both sides of the stepladder are locked into place. Ask someone to hold it for you, if possible. Clearly mark and make sure that you can see: Any grab bars or handrails. First and last steps. Where the edge of each step is. Use tools that help you move around (mobility aids) if they are needed. These  include: Canes. Walkers. Scooters. Crutches. Turn on the lights when you go into a dark area. Replace any light bulbs as soon as they burn out. Set up your furniture so you have a clear path. Avoid moving your furniture around. If any of your floors are uneven, fix them. If there are any pets around you, be aware of where they are. Review your medicines with your doctor. Some medicines can make you feel dizzy. This can increase your chance of falling. Ask your doctor what other things that you can do to help prevent falls.  This information is not intended to replace advice given to you by your health care provider. Make sure you discuss any questions you have with your health care provider. Document Released: 02/28/2009 Document Revised: 10/10/2015 Document Reviewed: 06/08/2014 Elsevier Interactive Patient Education  2017 [employer].

## 2022-07-07 NOTE — Progress Notes (Signed)
Subjective:   Alicia Kelly is a 67 y.o. female who presents for Medicare Annual (Subsequent) preventive examination.  I connected with  Alicia Kelly on 07/07/22 by a audio enabled telemedicine application and verified that I am speaking with the correct person using two identifiers.  Patient Location: Home  Provider Location: Office/Clinic  I discussed the limitations of evaluation and management by telemedicine. The patient expressed understanding and agreed to proceed.   Cardiac Risk Factors include: advanced age (>16mn, >>68women);diabetes mellitus;dyslipidemia;obesity (BMI >30kg/m2)     Objective:    There were no vitals filed for this visit. There is no height or weight on file to calculate BMI.     05/28/2022    9:28 AM 02/09/2022   11:30 AM 10/25/2020    3:42 PM  Advanced Directives  Does Patient Have a Medical Advance Directive? No No No    Current Medications (verified) Outpatient Encounter Medications as of 07/07/2022  Medication Sig   Accu-Chek Softclix Lancets lancets TEST BLOOD SUGAR THREE TIMES DAILY   atorvastatin (LIPITOR) 40 MG tablet Take 1 tablet (40 mg total) by mouth daily.   B-D UF III MINI PEN NEEDLES 31G X 5 MM MISC USE 1 PEN NEEDLE DAILY   Blood Glucose Calibration (ACCU-CHEK AVIVA) SOLN    Blood Glucose Monitoring Suppl (ACCU-CHEK AVIVA PLUS) w/Device KIT 1 each by Does not apply route 3 (three) times daily.   clotrimazole-betamethasone (LOTRISONE) cream Apply 1 Application topically daily.   fluticasone (FLONASE) 50 MCG/ACT nasal spray Place 2 sprays into both nostrils daily.   glucose blood (ACCU-CHEK GUIDE) test strip Check sugars threes times daily   Insulin Pen Needle (NOVOFINE PEN NEEDLE) 32G X 6 MM MISC 1 each by Does not apply route every 7 (seven) days.   lacosamide (VIMPAT) 200 MG TABS tablet Take 1.5 tablets (300 mg total) by mouth 2 (two) times daily.   levETIRAcetam (KEPPRA) 1000 MG tablet Take 1 tablet (1,000 mg total) by  mouth in the morning, at noon, and at bedtime.   losartan-hydrochlorothiazide (HYZAAR) 100-25 MG tablet Take 1 tablet by mouth daily.   montelukast (SINGULAIR) 10 MG tablet Take 1 tablet (10 mg total) by mouth daily. as directed   pantoprazole (PROTONIX) 40 MG tablet Take 1 tablet (40 mg total) by mouth 2 (two) times daily.   Semaglutide, 2 MG/DOSE, 8 MG/3ML SOPN Inject 2 mg as directed once a week.   TRESIBA FLEXTOUCH 100 UNIT/ML FlexTouch Pen Inject 30 Units into the skin daily.   ciprofloxacin (CIPRO) 500 MG tablet SMARTSIG:1 Tablet(s) By Mouth Every 12 Hours (Patient not taking: Reported on 07/07/2022)   doxycycline (MONODOX) 100 MG capsule Take 100 mg by mouth 2 (two) times daily. (Patient not taking: Reported on 07/07/2022)   furosemide (LASIX) 20 MG tablet Take 1 tablet (20 mg total) by mouth 2 (two) times daily. (Patient not taking: Reported on 07/07/2022)   ondansetron (ZOFRAN-ODT) 4 MG disintegrating tablet SMARTSIG:1 Tablet(s) Sublingual 1-2 Times Daily PRN (Patient not taking: Reported on 07/07/2022)   No facility-administered encounter medications on file as of 07/07/2022.    Allergies (verified) Levofloxacin, Lisinopril, Nitrofurantoin, Penicillins, Clarithromycin, and Sulfamethoxazole-trimethoprim   History: Past Medical History:  Diagnosis Date   Asthma    Calculus of gallbladder with chronic cholecystitis without obstruction 03/22/2017   [place]    [place]    Hypertension    Sleep apnea    Past Surgical History:  Procedure Laterality Date   APPENDECTOMY  CHOLECYSTECTOMY     TONSILLECTOMY     Family History  Problem Relation Age of Onset   Colon cancer Mother        d. 12   Hypertension Father    Diabetes Father    Allergic rhinitis Sister    Hypertension Sister    Cancer - Colon Maternal Aunt    Cancer Maternal Uncle    Colon cancer Maternal Grandmother        dx 61s   Colon cancer Paternal Grandmother        dx 54s   Diabetes Paternal  Grandmother    Breast cancer Neg Hx    Social History   Socioeconomic History   Marital status: Widowed    Spouse name: Not on file   Number of children: 0   Years of education: Not on file   Highest education level: 10th grade  Occupational History   Occupation: Disabled  Tobacco Use   Smoking status: Never   Smokeless tobacco: Never  Vaping Use   Vaping Use: Never used  Substance and Sexual Activity   Alcohol use: Never   Drug use: Never   Sexual activity: Not Currently  Other Topics Concern   Not on file  Social History Narrative   Spouse passed away 10 yrs ago -- no children   Social Determinants of Health   Financial Resource Strain: Low Risk  (07/07/2022)   Overall Financial Resource Strain (CARDIA)    Difficulty of Paying Living Expenses: Not hard at all  Food Insecurity: No Food Insecurity (07/07/2022)   Hunger Vital Sign    Worried About Running Out of Food in the Last Year: Never true    [place] in the Last Year: Never true  Transportation Needs: No Transportation Needs (07/07/2022)   PRAPARE - Hydrologist (Medical): No    Lack of Transportation (Non-Medical): No  Physical Activity: Sufficiently Active (07/07/2022)   Exercise Vital Sign    Days of Exercise per Week: 5 days    Minutes of Exercise per Session: 30 min  Stress: No Stress Concern Present (07/07/2022)   [place]    Feeling of Stress : Not at all  Social Connections: Socially Isolated (07/07/2022)   Social Connection and Isolation Panel [NHANES]    Frequency of Communication with Friends and Family: More than three times a week    Frequency of Social Gatherings with Friends and Family: Once a week    Attends Religious Services: Never    Marine scientist or Organizations: No    Attends Archivist Meetings: Never    Marital Status: Widowed    Tobacco Counseling Counseling given:  Not Answered   Clinical Intake:  Pre-visit preparation completed: Yes Pain : No/denies pain   BMI - recorded: 39.97 Nutritional Status: BMI > 30  Obese Nutritional Risks: None Diabetes: Yes (most recent A1C up 8.4) CBG done?: No Did pt. bring in CBG monitor from home?: No How often do you need to have someone help you when you read instructions, pamphlets, or other written materials from your doctor or pharmacy?: 2 - Rarely Interpreter Needed?: No    Activities of Daily Living    07/07/2022   10:16 AM  In your present state of health, do you have any difficulty performing the following activities:  Hearing? 0  Vision? 0  Difficulty concentrating or making decisions? 0  Walking or climbing  stairs? 0  Dressing or bathing? 0  Doing errands, shopping? 1  Preparing Food and eating ? N  Using the Toilet? N  In the past six months, have you accidently leaked urine? Y  Do you have problems with loss of bowel control? N  Managing your Medications? N  Managing your Finances? N  Housekeeping or managing your Housekeeping? N    Patient Care Team: [name], Hershal Coria as PCP - General (Physician Assistant) Marice Potter, MD as Consulting Physician (Hematology and Oncology) Kerin Perna., MD as Referring Physician (Neurology)     Assessment:   This is a routine wellness examination for [place].  Hearing/Vision screen No results found.  Dietary issues and exercise activities discussed: Current Exercise Habits: Home exercise routine, Type of exercise: walking;strength training/weights;stretching, Time (Minutes): 30, Frequency (Times/Week): 4, Weekly Exercise (Minutes/Week): 120, Intensity: Mild, Exercise limited by: None identified   Depression Screen    07/07/2022   10:31 AM 06/18/2022   10:05 AM 07/03/2020   10:06 AM  PHQ 2/9 Scores  PHQ - 2 Score 0 0 0    Fall Risk    07/07/2022   10:35 AM 06/18/2022   10:04 AM 05/14/2021    9:56 AM 07/03/2020   10:30 AM  Fall Risk    Falls in the past year? 0 0 0 0  Number falls in past yr: 0 0 0 0  Injury with Fall? 0 0 0 0  Risk for fall due to : No Fall Risks No Fall Risks No Fall Risks   Follow up Falls evaluation completed;Education provided  Falls evaluation completed Falls evaluation completed    [place]:  Any stairs in or around the home? No  If so, are there any without handrails? No  Home free of loose throw rugs in walkways, pet beds, electrical cords, etc? Yes  Adequate lighting in your home to reduce risk of falls? Yes   ASSISTIVE DEVICES UTILIZED TO PREVENT FALLS:  Use of a cane, walker or w/c? No  Grab bars in the bathroom? Yes  Shower chair or bench in shower? No  Elevated toilet seat or a handicapped toilet? No   Cognitive Function:        07/07/2022   10:36 AM  6CIT Screen  What Year? 0 points  What month? 0 points  What time? 0 points  Count back from 20 0 points  Months in reverse 2 points  Repeat phrase 2 points  Total Score 4 points    Immunizations Immunization History  Administered Date(s) Administered   Fluad Quad(high Dose 65+) 06/18/2022   Influenza Inj Mdck Quad Pf 06/05/2021   Moderna Sars-Covid-2 Vaccination 09/15/2019, 10/13/2019, 05/15/2020, 11/10/2020   PFIZER(Purple Top)SARS-COV-2 Vaccination 06/02/2022   PNEUMOCOCCAL CONJUGATE-20 06/18/2022   Pneumococcal Polysaccharide-23 10/02/2020   Tdap 04/04/2014    TDAP status: Up to date  Flu Vaccine status: Up to date  Pneumococcal vaccine status: Up to date  Covid-19 vaccine status: Information provided on how to obtain vaccines.   Qualifies for Shingles Vaccine? Yes   Zostavax completed No   Shingrix Completed?: No.    Education has been provided regarding the importance of this vaccine. Patient has been advised to call insurance company to determine out of pocket expense if they have not yet received this vaccine. Advised may also receive vaccine at local pharmacy or  Health Dept. Verbalized acceptance and understanding.  Screening Tests Health Maintenance  Topic Date Due   OPHTHALMOLOGY  EXAM  Never done   PAP SMEAR-Modifier  Never done   Medicare Annual Wellness (AWV)  04/29/2018   Zoster Vaccines- Shingrix (1 of 2) 09/16/2022 (Originally 11/25/1974)   DEXA SCAN  11/09/2023 (Originally 11/24/2021)   COVID-19 Vaccine (6 - 2023-24 season) 07/28/2022   MAMMOGRAM  11/12/2022   HEMOGLOBIN A1C  12/17/2022   FOOT EXAM  01/29/2023   Diabetic kidney evaluation - eGFR measurement  06/19/2023   Diabetic kidney evaluation - Urine ACR  06/19/2023   DTaP/Tdap/Td (2 - Td or Tdap) 04/04/2024   COLONOSCOPY (Pts 45-58yr Insurance coverage will need to be confirmed)  07/25/2024   Pneumonia Vaccine 66 Years old  Completed   INFLUENZA VACCINE  Completed   Hepatitis C Screening  Completed   HPV VACCINES  Aged Out   HIV Screening  Discontinued    Health Maintenance  Health Maintenance Due  Topic Date Due   OPHTHALMOLOGY EXAM  Never done   PAP SMEAR-Modifier  Never done   Medicare Annual Wellness (AWV)  04/29/2018    Colorectal cancer screening: Type of screening: Colonoscopy. Completed 07/26/2019. Repeat every 5 years  Mammogram status: Ordered   Bone Density status: Ordered   Lung Cancer Screening: (Low Dose CT Chest recommended if Age 66-80years, 30 pack-year currently smoking OR have quit w/in 15years.) does not qualify.   Lung Cancer Screening Referral: N/A  Additional Screening:  Hepatitis C Screening: does qualify; Completed 01/2022  Vision Screening: Recommended annual ophthalmology exams for early detection of glaucoma and other disorders of the eye. Is the patient up to date with their annual eye exam?  No  Who is the provider or what is the name of the [facility] which the patient attends annual [facility] exams[facility] a provider, would they like to be referred to a provider to establish care?  N/A .   Dental  Screening: Recommended annual dental exams for proper oral hygiene  Community Resource Referral / Chronic Care Management: CRR required this visit?  No   CCM required this visit?  No      Plan:    1- Patient will call to schedule eye appointment, records requested for last visit 2- Mammo & DEXA ordered 3- Printed information given about diabetic diet  I have personally reviewed and noted the following in the patient's chart:   Medical and social history Use of alcohol, tobacco or illicit drugs  Current medications and supplements including opioid prescriptions. Patient is not currently taking opioid prescriptions. Functional ability and status Nutritional status Physical activity Advanced directives List of other physicians Hospitalizations, surgeries, and ER visits in previous 12 months Vitals Screenings to include cognitive, depression, and falls Referrals and appointments  In addition, I have reviewed and discussed with patient certain preventive protocols, quality metrics, and best practice recommendations. A written personalized care plan for preventive services as well as general preventive health recommendations were provided to patient.     KErie Noe LPN   [id number]

## 2022-07-08 ENCOUNTER — Ambulatory Visit (INDEPENDENT_AMBULATORY_CARE_PROVIDER_SITE_OTHER): Payer: 59 | Admitting: Nurse Practitioner

## 2022-07-08 ENCOUNTER — Encounter: Payer: Self-pay | Admitting: Nurse Practitioner

## 2022-07-08 VITALS — BP 138/60 | HR 77 | Temp 97.3°F | Resp 18 | Ht 66.0 in | Wt 243.0 lb

## 2022-07-08 DIAGNOSIS — R600 Localized edema: Secondary | ICD-10-CM | POA: Diagnosis not present

## 2022-07-08 NOTE — Assessment & Plan Note (Signed)
Unnaboots applied for bliateral leg edema with blisters Keep taking Lasix 20 mg BD Compression socks Keep taking low salt diet Cardiac work up is normal  Will refer to the lymphedema clinic  for her wound management

## 2022-07-08 NOTE — Patient Instructions (Signed)
Peripheral Edema  Peripheral edema is swelling that is caused by a buildup of fluid. Peripheral edema most often affects the lower legs, ankles, and feet. It can also develop in the arms, hands, and face. The area of the body that has peripheral edema will look swollen. It may also feel heavy or warm. Your clothes may start to feel tight. Pressing on the area may make a temporary dent in your skin (pitting edema). You may not be able to move your swollen arm or leg as much as usual. There are many causes of peripheral edema. It can happen because of a complication of other conditions such as heart failure, kidney disease, or a problem with your circulation. It also can be a side effect of certain medicines or happen because of an infection. It often happens to women during pregnancy. Sometimes, the cause is not known. Follow these instructions at home: Managing pain, stiffness, and swelling  Raise (elevate) your legs while you are sitting or lying down. Move around often to prevent stiffness and to reduce swelling. Do not sit or stand for long periods of time. Do not wear tight clothing. Do not wear garters on your upper legs. Exercise your legs to get your circulation going. This helps to move the fluid back into your blood vessels, and it may help the swelling go down. Wear compression stockings as told by your health care provider. These stockings help to prevent blood clots and reduce swelling in your legs. It is important that these are the correct size. These stockings should be prescribed by your doctor to prevent possible injuries. If elastic bandages or wraps are recommended, use them as told by your health care provider. Medicines Take over-the-counter and prescription medicines only as told by your health care provider. Your health care provider may prescribe medicine to help your body get rid of excess water (diuretic). Take this medicine if you are told to take it. General  instructions Eat a low-salt (low-sodium) diet as told by your health care provider. Sometimes, eating less salt may reduce swelling. Pay attention to any changes in your symptoms. Moisturize your skin daily to help prevent skin from cracking and draining. Keep all follow-up visits. This is important. Contact a health care provider if: You have a fever. You have swelling in only one leg. You have increased swelling, redness, or pain in one or both of your legs. You have drainage or sores at the area where you have edema. Get help right away if: You have edema that starts suddenly or is getting worse, especially if you are pregnant or have a medical condition. You develop shortness of breath, especially when you are lying down. You have pain in your chest or abdomen. You feel weak. You feel like you will faint. These symptoms may be an emergency. Get help right away. Call 911. Do not wait to see if the symptoms will go away. Do not drive yourself to the hospital. Summary Peripheral edema is swelling that is caused by a buildup of fluid. Peripheral edema most often affects the lower legs, ankles, and feet. Move around often to prevent stiffness and to reduce swelling. Do not sit or stand for long periods of time. Pay attention to any changes in your symptoms. Contact a health care provider if you have edema that starts suddenly or is getting worse, especially if you are pregnant or have a medical condition. Get help right away if you develop shortness of breath, especially when lying down.   This information is not intended to replace advice given to you by your health care provider. Make sure you discuss any questions you have with your health care provider. Document Revised: 01/06/2021 Document Reviewed: 01/06/2021 Elsevier Patient Education  2023 [employer]  

## 2022-07-08 NOTE — Progress Notes (Signed)
Acute Office Visit  Subjective:    Patient ID: Alicia Kelly, female    DOB: [birth year], 67 y.o.   MRN: [id number]  Chief Complaints: Follow up for swollen legs  History of Present ilIness: Patient is in today for follow up for  leg swelling. She started bilateral leg swollen 3-4 weeks ago. But lately there are more water blisters are coming and she is concerned about it.  She applied compression shocks 3-4 weeks ago for 2-3 days  and when she took compression socks out, she saw more blisters. She came in the clinic last week with a same problem and her cardiac work up was fine and started Lasix 20 mg BD. She wants to be referred to the appropriate place to taken care of the wound and bilateral leg edema. Denies headache, dizziness, SHORTNESS OF BREATH, distress and palpitation.   Past Medical History:  Diagnosis Date   Asthma    Calculus of gallbladder with chronic cholecystitis without obstruction 03/22/2017   Diabetes ([place])    Epilepsy ([place])    Hypertension    Sleep apnea     Past Surgical History:  Procedure Laterality Date   APPENDECTOMY     CHOLECYSTECTOMY     TONSILLECTOMY      Family History  Problem Relation Age of Onset   Colon cancer Mother        d. 21   Hypertension Father    Diabetes Father    Allergic rhinitis Sister    Hypertension Sister    Cancer - Colon Maternal Aunt    Cancer Maternal Uncle    Colon cancer Maternal Grandmother        dx 36s   Colon cancer Paternal Grandmother        dx 80s   Diabetes Paternal Grandmother    Breast cancer Neg Hx     Social History   Socioeconomic History   Marital status: Widowed    Spouse name: Not on file   Number of children: 0   Years of education: Not on file   Highest education level: 10th grade  Occupational History   Occupation: Disabled  Tobacco Use   Smoking status: Never   Smokeless tobacco: Never  Vaping Use   Vaping Use: Never used  Substance and Sexual Activity   Alcohol use: Never    Drug use: Never   Sexual activity: Not Currently  Other Topics Concern   Not on file  Social History Narrative   Spouse passed away 10 yrs ago -- no children   Social Determinants of Health   Financial Resource Strain: Low Risk  (07/07/2022)   Overall Financial Resource Strain (CARDIA)    Difficulty of Paying Living Expenses: Not hard at all  Food Insecurity: No Food Insecurity (07/07/2022)   Hunger Vital Sign    Worried About Running Out of Food in the Last Year: Never true    [place] in the Last Year: Never true  Transportation Needs: No Transportation Needs (07/07/2022)   PRAPARE - Hydrologist (Medical): No    Lack of Transportation (Non-Medical): No  Physical Activity: Sufficiently Active (07/07/2022)   Exercise Vital Sign    Days of Exercise per Week: 5 days    Minutes of Exercise per Session: 30 min  Stress: No Stress Concern Present (07/07/2022)   [place]    Feeling of Stress : Not at all  Social Connections:  Socially Isolated (07/07/2022)   Social Connection and Isolation Panel [NHANES]    Frequency of Communication with Friends and Family: More than three times a week    Frequency of Social Gatherings with Friends and Family: Once a week    Attends Religious Services: Never    Marine scientist or Organizations: No    Attends Archivist Meetings: Never    Marital Status: Widowed  Intimate Partner Violence: Not At Risk (07/07/2022)   Humiliation, Afraid, Rape, and Kick questionnaire    Fear of Current or Ex-Partner: No    Emotionally Abused: No    Physically Abused: No    Sexually Abused: No    Outpatient Medications Prior to Visit  Medication Sig Dispense Refill   Accu-Chek Softclix Lancets lancets TEST BLOOD SUGAR THREE TIMES DAILY 300 each 1   atorvastatin (LIPITOR) 40 MG tablet Take 1 tablet (40 mg total) by mouth daily. 90 tablet 0   B-D UF  III MINI PEN NEEDLES 31G X 5 MM MISC USE 1 PEN NEEDLE DAILY 100 each 2   Blood Glucose Calibration (ACCU-CHEK AVIVA) SOLN      Blood Glucose Monitoring Suppl (ACCU-CHEK AVIVA PLUS) w/Device KIT 1 each by Does not apply route 3 (three) times daily. 1 kit 0   ciprofloxacin (CIPRO) 500 MG tablet      clotrimazole-betamethasone (LOTRISONE) cream Apply 1 Application topically daily. 30 g 3   doxycycline (MONODOX) 100 MG capsule Take 100 mg by mouth 2 (two) times daily.     fluticasone (FLONASE) 50 MCG/ACT nasal spray Place 2 sprays into both nostrils daily.     furosemide (LASIX) 20 MG tablet Take 1 tablet (20 mg total) by mouth 2 (two) times daily. 30 tablet 3   glucose blood (ACCU-CHEK GUIDE) test strip Check sugars threes times daily 300 each 3   Insulin Pen Needle (NOVOFINE PEN NEEDLE) 32G X 6 MM MISC 1 each by Does not apply route every 7 (seven) days. 50 each 3   lacosamide (VIMPAT) 200 MG TABS tablet Take 1.5 tablets (300 mg total) by mouth 2 (two) times daily. 180 tablet 2   levETIRAcetam (KEPPRA) 1000 MG tablet Take 1 tablet (1,000 mg total) by mouth in the morning, at noon, and at bedtime. 90 tablet 6   losartan-hydrochlorothiazide (HYZAAR) 100-25 MG tablet Take 1 tablet by mouth daily. 90 tablet 2   montelukast (SINGULAIR) 10 MG tablet Take 1 tablet (10 mg total) by mouth daily. as directed 90 tablet 2   ondansetron (ZOFRAN-ODT) 4 MG disintegrating tablet      pantoprazole (PROTONIX) 40 MG tablet Take 1 tablet (40 mg total) by mouth 2 (two) times daily. 180 tablet 1   Semaglutide, 2 MG/DOSE, 8 MG/3ML SOPN Inject 2 mg as directed once a week. 3 mL 5   TRESIBA FLEXTOUCH 100 UNIT/ML FlexTouch Pen Inject 30 Units into the skin daily. 3 mL 2   No facility-administered medications prior to visit.    Allergies  Allergen Reactions   Levofloxacin Other (See Comments)    Throat swelling   Lisinopril Hives   Nitrofurantoin Swelling    Throat swelling   Penicillins Anaphylaxis    Clarithromycin Other (See Comments)    seizures   Sulfamethoxazole-Trimethoprim     Causes seizures    Review of Systems  Constitutional:  Negative for chills and fatigue.  HENT:  Negative for congestion, ear pain, sinus pain and sore throat.   Respiratory:  Negative for cough and shortness of  breath.   Cardiovascular:  Positive for leg swelling (bilateral leg swelling with water blisters). Negative for chest pain.  Gastrointestinal:  Negative for abdominal pain, constipation, diarrhea, nausea and vomiting.  Genitourinary:  Negative for dysuria and frequency.  Musculoskeletal:  Negative for arthralgias, back pain and myalgias.  Neurological:  Negative for dizziness and headaches.       Objective:    Physical Exam Vitals reviewed.  Constitutional:      Appearance: Normal appearance. She is obese.  Neck:     Vascular: No carotid bruit.  Cardiovascular:     Rate and Rhythm: Normal rate and regular rhythm.     Heart sounds: Normal heart sounds.  Pulmonary:     Effort: Pulmonary effort is normal.     Breath sounds: Normal breath sounds.  Abdominal:     General: Abdomen is flat. Bowel sounds are normal.     Palpations: Abdomen is soft.     Tenderness: There is no abdominal tenderness.  Musculoskeletal:        General: Swelling and signs of injury present.     Cervical back: Normal range of motion.     Right lower leg: Edema present.     Left lower leg: Edema present.     Comments: +1 edema with water blisters  Neurological:     Mental Status: She is alert and oriented to person, place, and time.  Psychiatric:        Mood and Affect: Mood normal.        Behavior: Behavior normal.    BP 138/60   Pulse 77   Temp (!) 97.3 F (36.3 C)   Resp 18   Ht 5' 6"$  (1.676 m)   Wt 243 lb (110.2 kg)   LMP  (LMP Unknown)   SpO2 98%   BMI 39.22 kg/m  Wt Readings from Last 3 Encounters:  07/08/22 243 lb (110.2 kg)  06/18/22 233 lb (105.7 kg)  05/28/22 257 lb 3.2 oz (116.7 kg)     Health Maintenance Due  Topic Date Due   OPHTHALMOLOGY EXAM  Never done   PAP SMEAR-Modifier  Never done    There are no preventive care reminders to display for this patient.   Lab Results  Component Value Date   TSH 1.210 06/18/2022   Lab Results  Component Value Date   WBC 10.3 06/18/2022   HGB 10.5 (L) 06/18/2022   HCT 32.7 (L) 06/18/2022   MCV 92 06/18/2022   PLT 529 (H) 06/18/2022   Lab Results  Component Value Date   NA 138 06/18/2022   K 4.4 06/18/2022   CO2 24 06/18/2022   GLUCOSE 249 (H) 06/18/2022   BUN 17 06/18/2022   CREATININE 0.74 06/18/2022   BILITOT 0.2 06/18/2022   ALKPHOS 78 06/18/2022   AST 18 06/18/2022   ALT 16 06/18/2022   PROT 6.2 06/18/2022   ALBUMIN 4.1 06/18/2022   CALCIUM 9.6 06/18/2022   EGFR 90 06/18/2022   Lab Results  Component Value Date   CHOL 118 06/18/2022   Lab Results  Component Value Date   HDL 40 06/18/2022   Lab Results  Component Value Date   LDLCALC 49 06/18/2022   Lab Results  Component Value Date   TRIG 172 (H) 06/18/2022   Lab Results  Component Value Date   CHOLHDL 3.0 06/18/2022   Lab Results  Component Value Date   HGBA1C 8.4 (H) 06/18/2022       Assessment & Plan:  Bilateral leg edema Assessment & Plan: Unnaboots applied for bliateral leg edema with blisters Keep taking Lasix 20 mg BD Compression socks Keep taking low salt diet Cardiac work up is normal  Will refer to the lymphedema clinic  for her wound management    Orders: -     Ambulatory referral to Physical Therapy     Follow-up: Return in about 1 week (around 07/15/2022).  I, Neil Crouch have reviewed all documentation for this visit. The documentation on 07/08/22   for the exam, diagnosis, procedures, and orders are all accurate and complete.    An After Visit Summary was printed and given to the patient.  Neil Crouch, DNP, [place] [phone]

## 2022-07-09 ENCOUNTER — Other Ambulatory Visit: Payer: Self-pay | Admitting: Nurse Practitioner

## 2022-07-09 NOTE — Addendum Note (Signed)
Addended byNeil Crouch on: 07/09/2022 04:27 PM   Modules accepted: Orders

## 2022-07-15 ENCOUNTER — Encounter: Payer: Self-pay | Admitting: Physician Assistant

## 2022-07-15 ENCOUNTER — Ambulatory Visit (INDEPENDENT_AMBULATORY_CARE_PROVIDER_SITE_OTHER): Payer: 59 | Admitting: Physician Assistant

## 2022-07-15 VITALS — BP 118/68 | HR 96 | Temp 97.2°F | Ht 66.0 in | Wt 244.4 lb

## 2022-07-15 DIAGNOSIS — I872 Venous insufficiency (chronic) (peripheral): Secondary | ICD-10-CM | POA: Diagnosis not present

## 2022-07-15 DIAGNOSIS — N3000 Acute cystitis without hematuria: Secondary | ICD-10-CM | POA: Diagnosis not present

## 2022-07-15 DIAGNOSIS — I10 Essential (primary) hypertension: Secondary | ICD-10-CM | POA: Diagnosis not present

## 2022-07-15 DIAGNOSIS — Z8249 Family history of ischemic heart disease and other diseases of the circulatory system: Secondary | ICD-10-CM

## 2022-07-15 DIAGNOSIS — R600 Localized edema: Secondary | ICD-10-CM

## 2022-07-15 LAB — POCT URINALYSIS DIP (CLINITEK)
Bilirubin, UA: NEGATIVE
Blood, UA: NEGATIVE
Glucose, UA: NEGATIVE mg/dL
Ketones, POC UA: NEGATIVE mg/dL
Nitrite, UA: NEGATIVE
Spec Grav, UA: 1.015 (ref 1.010–1.025)
Urobilinogen, UA: 0.2 E.U./dL
pH, UA: 6 (ref 5.0–8.0)

## 2022-07-15 MED ORDER — DOXYCYCLINE HYCLATE 100 MG PO TABS: 100.0000 mg | ORAL_TABLET | Freq: Two times a day (BID) | ORAL | 0 refills | Status: DC

## 2022-07-15 NOTE — Progress Notes (Signed)
Established Patient Office Visit  Subjective:  Patient ID: Alicia Kelly, female    DOB: Mar 30, 1956  Age: 67 y.o. MRN: [id number]  CC:  Chief Complaint  Patient presents with   Edema    HPI [name] presents for follow up of edema of lower legs.  Patient has what appears to be lymphedema and also had some blistering that started a few weeks ago.  She saw other provider last week and had unna boots put on both legs.  She states they fell off after about 2 days.  She was also referred to PT for her lymphedema but has not heard about appt yet.  She has a few more blisters scattered on leg today. Pt states because of this and because she has a strong family history of CAD she would like referral to cardiology and requests to be sent to [facility] (she said she had called to set up appt with Dr Bettina Gavia and their office suggested for her to go to [facility]) Pt is currently taking lasix '20mg'$  bid which she had increased about a month ago - due to check cmp  Pt complains of urine frequency and urgency for the past several days - denies fever or abdominal pain  Past Medical History:  Diagnosis Date   Asthma    Calculus of gallbladder with chronic cholecystitis without obstruction 03/22/2017   Diabetes ([place])    Epilepsy ([place])    Hypertension    Sleep apnea     Past Surgical History:  Procedure Laterality Date   APPENDECTOMY     CHOLECYSTECTOMY     TONSILLECTOMY      Family History  Problem Relation Age of Onset   Colon cancer Mother        d. 13   Hypertension Father    Diabetes Father    Allergic rhinitis Sister    Hypertension Sister    Cancer - Colon Maternal Aunt    Cancer Maternal Uncle    Colon cancer Maternal Grandmother        dx 5s   Colon cancer Paternal Grandmother        dx 71s   Diabetes Paternal Grandmother    Breast cancer Neg Hx     Social History   Socioeconomic History   Marital status: Widowed    Spouse name: Not on file   Number of  children: 0   Years of education: Not on file   Highest education level: 10th grade  Occupational History   Occupation: Disabled  Tobacco Use   Smoking status: Never   Smokeless tobacco: Never  Vaping Use   Vaping Use: Never used  Substance and Sexual Activity   Alcohol use: Never   Drug use: Never   Sexual activity: Not Currently  Other Topics Concern   Not on file  Social History Narrative   Spouse passed away 10 yrs ago -- no children   Social Determinants of Health   Financial Resource Strain: Low Risk  (07/07/2022)   Overall Financial Resource Strain (CARDIA)    Difficulty of Paying Living Expenses: Not hard at all  Food Insecurity: No Food Insecurity (07/07/2022)   Hunger Vital Sign    Worried About Running Out of Food in the Last Year: Never true    [place] in the Last Year: Never true  Transportation Needs: No Transportation Needs (07/07/2022)   PRAPARE - Hydrologist (Medical): No  Lack of Transportation (Non-Medical): No  Physical Activity: Sufficiently Active (07/07/2022)   Exercise Vital Sign    Days of Exercise per Week: 5 days    Minutes of Exercise per Session: 30 min  Stress: No Stress Concern Present (07/07/2022)   [place]    Feeling of Stress : Not at all  Social Connections: Socially Isolated (07/07/2022)   Social Connection and Isolation Panel [NHANES]    Frequency of Communication with Friends and Family: More than three times a week    Frequency of Social Gatherings with Friends and Family: Once a week    Attends Religious Services: Never    Marine scientist or Organizations: No    Attends Archivist Meetings: Never    Marital Status: Widowed  Intimate Partner Violence: Not At Risk (07/07/2022)   Humiliation, Afraid, Rape, and Kick questionnaire    Fear of Current or Ex-Partner: No    Emotionally Abused: No    Physically Abused:  No    Sexually Abused: No     Current Outpatient Medications:    Accu-Chek Softclix Lancets lancets, TEST BLOOD SUGAR THREE TIMES DAILY, Disp: 300 each, Rfl: 1   atorvastatin (LIPITOR) 40 MG tablet, Take 1 tablet (40 mg total) by mouth daily., Disp: 90 tablet, Rfl: 0   B-D UF III MINI PEN NEEDLES 31G X 5 MM MISC, USE 1 PEN NEEDLE DAILY, Disp: 100 each, Rfl: 2   Blood Glucose Calibration (ACCU-CHEK AVIVA) SOLN, , Disp: , Rfl:    Blood Glucose Monitoring Suppl (ACCU-CHEK AVIVA PLUS) w/Device KIT, 1 each by Does not apply route 3 (three) times daily., Disp: 1 kit, Rfl: 0   clotrimazole-betamethasone (LOTRISONE) cream, Apply 1 Application topically daily., Disp: 30 g, Rfl: 3   doxycycline (VIBRA-TABS) 100 MG tablet, Take 1 tablet (100 mg total) by mouth 2 (two) times daily., Disp: 20 tablet, Rfl: 0   fluticasone (FLONASE) 50 MCG/ACT nasal spray, Place 2 sprays into both nostrils daily., Disp: , Rfl:    furosemide (LASIX) 20 MG tablet, Take 1 tablet (20 mg total) by mouth 2 (two) times daily., Disp: 30 tablet, Rfl: 3   glucose blood (ACCU-CHEK GUIDE) test strip, Check sugars threes times daily, Disp: 300 each, Rfl: 3   Insulin Pen Needle (NOVOFINE PEN NEEDLE) 32G X 6 MM MISC, 1 each by Does not apply route every 7 (seven) days., Disp: 50 each, Rfl: 3   lacosamide (VIMPAT) 200 MG TABS tablet, Take 1.5 tablets (300 mg total) by mouth 2 (two) times daily., Disp: 180 tablet, Rfl: 2   levETIRAcetam (KEPPRA) 1000 MG tablet, Take 1 tablet (1,000 mg total) by mouth in the morning, at noon, and at bedtime., Disp: 90 tablet, Rfl: 6   losartan-hydrochlorothiazide (HYZAAR) 100-25 MG tablet, Take 1 tablet by mouth daily., Disp: 90 tablet, Rfl: 2   montelukast (SINGULAIR) 10 MG tablet, Take 1 tablet (10 mg total) by mouth daily. as directed, Disp: 90 tablet, Rfl: 2   ondansetron (ZOFRAN-ODT) 4 MG disintegrating tablet, , Disp: , Rfl:    pantoprazole (PROTONIX) 40 MG tablet, Take 1 tablet (40 mg total) by mouth 2  (two) times daily., Disp: 180 tablet, Rfl: 1   Semaglutide, 2 MG/DOSE, 8 MG/3ML SOPN, Inject 2 mg as directed once a week., Disp: 3 mL, Rfl: 5   TRESIBA FLEXTOUCH 100 UNIT/ML FlexTouch Pen, Inject 30 Units into the skin daily., Disp: 3 mL, Rfl: 2   Allergies  Allergen  Reactions   Levofloxacin Other (See Comments)    Throat swelling   Lisinopril Hives   Nitrofurantoin Swelling    Throat swelling   Penicillins Anaphylaxis   Clarithromycin Other (See Comments)    seizures   Sulfamethoxazole-Trimethoprim     Causes seizures    ROS CONSTITUTIONAL: Negative for chills, fatigue, fever, unintentional weight gain and unintentional weight loss.  E/N/T: Negative for ear pain, nasal congestion and sore throat.  CARDIOVASCULAR: Negative for chest pain, dizziness, palpitations - has pedal edema RESPIRATORY: Negative for recent cough and dyspnea.  GASTROINTESTINAL: Negative for abdominal pain, acid reflux symptoms, constipation, diarrhea, nausea and vomiting.  INTEGUMENTARY: small scattered blister on lower legs but without stasis dermatitis     Objective:    PHYSICAL EXAM:   VS: BP 118/68 (BP Location: Left Arm, Patient Position: Sitting, Cuff Size: Large)   Pulse 96   Temp (!) 97.2 F (36.2 C) (Temporal)   Ht '5\' 6"'$  (1.676 m)   Wt 244 lb 6.4 oz (110.9 kg)   LMP  (LMP Unknown)   SpO2 98%   BMI 39.45 kg/m   GEN: Well nourished, well developed, in no acute distress  Cardiac: RRR; no murmurs, rubs, or gallops,has generalized lower extremity edema - non pitting - has scattered small blisters noted Respiratory:  normal respiratory rate and pattern with no distress - normal breath sounds with no rales, rhonchi, wheezes or rubs GI: normal bowel sounds, no masses or tenderness MS: no deformity or atrophy   Office Visit on 07/15/2022  Component Date Value Ref Range Status   Color, UA 07/15/2022 yellow  yellow Final   Clarity, UA 07/15/2022 cloudy (A)  clear Final   Glucose, UA  07/15/2022 negative  negative mg/dL Final   Bilirubin, UA 07/15/2022 negative  negative Final   Ketones, POC UA 07/15/2022 negative  negative mg/dL Final   Spec Grav, UA 07/15/2022 1.015  1.010 - 1.025 Final   Blood, UA 07/15/2022 negative  negative Final   pH, UA 07/15/2022 6.0  5.0 - 8.0 Final   POC PROTEIN,UA 07/15/2022 trace  negative, trace Final   Urobilinogen, UA 07/15/2022 0.2  0.2 or 1.0 E.U./dL Final   Nitrite, UA 07/15/2022 Negative  Negative Final   Leukocytes, UA 07/15/2022 Large (3+) (A)  Negative Final       Health Maintenance Due  Topic Date Due   OPHTHALMOLOGY EXAM  Never done   PAP SMEAR-Modifier  Never done    There are no preventive care reminders to display for this patient.  Lab Results  Component Value Date   TSH 1.210 06/18/2022   Lab Results  Component Value Date   WBC 10.3 06/18/2022   HGB 10.5 (L) 06/18/2022   HCT 32.7 (L) 06/18/2022   MCV 92 06/18/2022   PLT 529 (H) 06/18/2022   Lab Results  Component Value Date   NA 138 06/18/2022   K 4.4 06/18/2022   CO2 24 06/18/2022   GLUCOSE 249 (H) 06/18/2022   BUN 17 06/18/2022   CREATININE 0.74 06/18/2022   BILITOT 0.2 06/18/2022   ALKPHOS 78 06/18/2022   AST 18 06/18/2022   ALT 16 06/18/2022   PROT 6.2 06/18/2022   ALBUMIN 4.1 06/18/2022   CALCIUM 9.6 06/18/2022   EGFR 90 06/18/2022   Lab Results  Component Value Date   CHOL 118 06/18/2022   Lab Results  Component Value Date   HDL 40 06/18/2022   Lab Results  Component Value Date   LDLCALC 49 06/18/2022  Lab Results  Component Value Date   TRIG 172 (H) 06/18/2022   Lab Results  Component Value Date   CHOLHDL 3.0 06/18/2022   Lab Results  Component Value Date   HGBA1C 8.4 (H) 06/18/2022      Assessment & Plan:   Problem List Items Addressed This Visit       Cardiovascular and Mediastinum   Essential hypertension   Relevant Orders   Ambulatory referral to Cardiology   Chronic venous insufficiency   Relevant  Orders   Ambulatory referral to Cardiology     Other   Bilateral leg edema - Primary   Relevant Orders   Comprehensive metabolic panel Continue lasix '20mg'$  bid Low salt diet Keep legs elevated Referral has been made for PT   Other Visit Diagnoses     Acute cystitis without hematuria       Relevant Medications   doxycycline (VIBRA-TABS) 100 MG tablet   Other Relevant Orders   POCT URINALYSIS DIP (CLINITEK) (Completed)   Urine Culture   Family history of chronic ischemic heart disease       Relevant Orders   Ambulatory referral to Cardiology       Meds ordered this encounter  Medications   doxycycline (VIBRA-TABS) 100 MG tablet    Sig: Take 1 tablet (100 mg total) by mouth 2 (two) times daily.    Dispense:  20 tablet    Refill:  0    Order Specific Question:   Supervising Provider    Answer:   Shelton Silvas    Follow-up: Return in about 3 weeks (around 08/05/2022) for follow up.    SARA R Daphney Hopke, PA-C

## 2022-07-16 LAB — COMPREHENSIVE METABOLIC PANEL
ALT: 17 IU/L (ref 0–32)
AST: 18 IU/L (ref 0–40)
Albumin/Globulin Ratio: 1.6 (ref 1.2–2.2)
Albumin: 4.1 g/dL (ref 3.9–4.9)
Alkaline Phosphatase: 85 IU/L (ref 44–121)
BUN/Creatinine Ratio: 21 (ref 12–28)
BUN: 17 mg/dL (ref 8–27)
Bilirubin Total: 0.2 mg/dL (ref 0.0–1.2)
CO2: 24 mmol/L (ref 20–29)
Calcium: 9.8 mg/dL (ref 8.7–10.3)
Chloride: 98 mmol/L (ref 96–106)
Creatinine, Ser: 0.81 mg/dL (ref 0.57–1.00)
Globulin, Total: 2.5 g/dL (ref 1.5–4.5)
Glucose: 191 mg/dL — ABNORMAL HIGH (ref 70–99)
Potassium: 4.3 mmol/L (ref 3.5–5.2)
Sodium: 139 mmol/L (ref 134–144)
Total Protein: 6.6 g/dL (ref 6.0–8.5)
eGFR: 81 mL/min/{1.73_m2} (ref >59)

## 2022-07-20 ENCOUNTER — Other Ambulatory Visit: Payer: Self-pay | Admitting: Physician Assistant

## 2022-07-20 DIAGNOSIS — N3000 Acute cystitis without hematuria: Secondary | ICD-10-CM

## 2022-07-20 LAB — URINE CULTURE

## 2022-07-20 MED ORDER — VANCOMYCIN HCL 125 MG PO CAPS: 125.0000 mg | ORAL_CAPSULE | Freq: Four times a day (QID) | ORAL | 0 refills | Status: AC

## 2022-07-22 ENCOUNTER — Telehealth: Payer: Self-pay

## 2022-07-22 NOTE — Progress Notes (Unsigned)
Subjective:  Patient ID: Alicia Kelly, female    DOB: [birth year]  Age: 67 y.o. MRN: [id number]  No chief complaint on file.    History of Present illness:  ER FOLLOW UP Time since discharge:  Hospital/facility: [facility] Diagnosis:  Procedures/tests:  Consultants:  New medications:  Discharge instructions:   Status: {Blank multiple:19196::"better","worse","stable","fluctuating"}    Current Outpatient Medications on File Prior to Visit  Medication Sig Dispense Refill   Accu-Chek Softclix Lancets lancets TEST BLOOD SUGAR THREE TIMES DAILY 300 each 1   atorvastatin (LIPITOR) 40 MG tablet Take 1 tablet (40 mg total) by mouth daily. 90 tablet 0   B-D UF III MINI PEN NEEDLES 31G X 5 MM MISC USE 1 PEN NEEDLE DAILY 100 each 2   Blood Glucose Calibration (ACCU-CHEK AVIVA) SOLN      Blood Glucose Monitoring Suppl (ACCU-CHEK AVIVA PLUS) w/Device KIT 1 each by Does not apply route 3 (three) times daily. 1 kit 0   clotrimazole-betamethasone (LOTRISONE) cream Apply 1 Application topically daily. 30 g 3   doxycycline (VIBRA-TABS) 100 MG tablet Take 1 tablet (100 mg total) by mouth 2 (two) times daily. 20 tablet 0   fluticasone (FLONASE) 50 MCG/ACT nasal spray Place 2 sprays into both nostrils daily.     furosemide (LASIX) 20 MG tablet Take 1 tablet (20 mg total) by mouth 2 (two) times daily. 30 tablet 3   glucose blood (ACCU-CHEK GUIDE) test strip Check sugars threes times daily 300 each 3   Insulin Pen Needle (NOVOFINE PEN NEEDLE) 32G X 6 MM MISC 1 each by Does not apply route every 7 (seven) days. 50 each 3   lacosamide (VIMPAT) 200 MG TABS tablet Take 1.5 tablets (300 mg total) by mouth 2 (two) times daily. 180 tablet 2   levETIRAcetam (KEPPRA) 1000 MG tablet Take 1 tablet (1,000 mg total) by mouth in the morning, at noon, and at bedtime. 90 tablet 6   losartan-hydrochlorothiazide (HYZAAR) 100-25 MG tablet Take 1 tablet by mouth daily. 90 tablet 2   montelukast (SINGULAIR) 10 MG  tablet Take 1 tablet (10 mg total) by mouth daily. as directed 90 tablet 2   ondansetron (ZOFRAN-ODT) 4 MG disintegrating tablet      pantoprazole (PROTONIX) 40 MG tablet Take 1 tablet (40 mg total) by mouth 2 (two) times daily. 180 tablet 1   Semaglutide, 2 MG/DOSE, 8 MG/3ML SOPN Inject 2 mg as directed once a week. 3 mL 5   TRESIBA FLEXTOUCH 100 UNIT/ML FlexTouch Pen Inject 30 Units into the skin daily. 3 mL 2   vancomycin (VANCOCIN) 125 MG capsule Take 1 capsule (125 mg total) by mouth 4 (four) times daily for 10 days. 40 capsule 0   No current facility-administered medications on file prior to visit.   Past Medical History:  Diagnosis Date   Asthma    Calculus of gallbladder with chronic cholecystitis without obstruction 03/22/2017   Diabetes ([place])    Epilepsy ([place])    Hypertension    Sleep apnea    Past Surgical History:  Procedure Laterality Date   APPENDECTOMY     CHOLECYSTECTOMY     TONSILLECTOMY      Family History  Problem Relation Age of Onset   Colon cancer Mother        d. 17   Hypertension Father    Diabetes Father    Allergic rhinitis Sister    Hypertension Sister    Cancer - Colon Maternal Aunt    Cancer  Maternal Uncle    Colon cancer Maternal Grandmother        dx 2s   Colon cancer Paternal Grandmother        dx 60s   Diabetes Paternal Grandmother    Breast cancer Neg Hx    Social History   Socioeconomic History   Marital status: Widowed    Spouse name: Not on file   Number of children: 0   Years of education: Not on file   Highest education level: 10th grade  Occupational History   Occupation: Disabled  Tobacco Use   Smoking status: Never   Smokeless tobacco: Never  Vaping Use   Vaping Use: Never used  Substance and Sexual Activity   Alcohol use: Never   Drug use: Never   Sexual activity: Not Currently  Other Topics Concern   Not on file  Social History Narrative   Spouse passed away 10 yrs ago -- no children   Social Determinants of  Health   Financial Resource Strain: Low Risk  (07/07/2022)   Overall Financial Resource Strain (CARDIA)    Difficulty of Paying Living Expenses: Not hard at all  Food Insecurity: No Food Insecurity (07/07/2022)   Hunger Vital Sign    Worried About Running Out of Food in the Last Year: Never true    [place] in the Last Year: Never true  Transportation Needs: No Transportation Needs (07/07/2022)   PRAPARE - Hydrologist (Medical): No    Lack of Transportation (Non-Medical): No  Physical Activity: Sufficiently Active (07/07/2022)   Exercise Vital Sign    Days of Exercise per Week: 5 days    Minutes of Exercise per Session: 30 min  Stress: No Stress Concern Present (07/07/2022)   [place]    Feeling of Stress : Not at all  Social Connections: Socially Isolated (07/07/2022)   Social Connection and Isolation Panel [NHANES]    Frequency of Communication with Friends and Family: More than three times a week    Frequency of Social Gatherings with Friends and Family: Once a week    Attends Religious Services: Never    Marine scientist or Organizations: No    Attends Archivist Meetings: Never    Marital Status: Widowed    Review of Systems   Objective:  LMP  (LMP Unknown)      07/15/2022   10:50 AM 07/08/2022   10:36 AM 06/18/2022    9:58 AM  BP/Weight  Systolic BP 123456 0000000 0000000  Diastolic BP 68 60 70  Wt. (Lbs) 244.4 243 233  BMI 39.45 kg/m2 39.22 kg/m2 39.99 kg/m2    Physical Exam  Diabetic Foot Exam - Simple   No data filed        07/07/2022   10:31 AM 06/18/2022   10:05 AM 07/03/2020   10:06 AM  Depression screen PHQ 2/9  Decreased Interest 0 0 0  Down, Depressed, Hopeless 0 0 0  PHQ - 2 Score 0 0 0       02/25/2022   12:23 PM 03/02/2022   11:00 AM 05/28/2022    9:28 AM 06/18/2022   10:04 AM 07/07/2022   10:35 AM  Fall Risk  Falls in the past year?    0  0  Was there an injury with Fall?    0 0  Fall Risk Category Calculator    0 0  (RETIRED) Patient Fall  Risk Level Low fall risk Low fall risk Low fall risk    Patient at Risk for Falls Due to    No Fall Risks No Fall Risks  Fall risk Follow up     Falls evaluation completed;Education provided    Lab Results  Component Value Date   WBC 10.3 06/18/2022   HGB 10.5 (L) 06/18/2022   HCT 32.7 (L) 06/18/2022   PLT 529 (H) 06/18/2022   GLUCOSE 191 (H) 07/15/2022   CHOL 118 06/18/2022   TRIG 172 (H) 06/18/2022   HDL 40 06/18/2022   LDLCALC 49 06/18/2022   ALT 17 07/15/2022   AST 18 07/15/2022   NA 139 07/15/2022   K 4.3 07/15/2022   CL 98 07/15/2022   CREATININE 0.81 07/15/2022   BUN 17 07/15/2022   CO2 24 07/15/2022   TSH 1.210 06/18/2022   HGBA1C 8.4 (H) 06/18/2022   MICROALBUR 10 07/03/2020      Assessment & Plan:   There are no diagnoses linked to this encounter.   Follow-up: No follow-ups on file.  An After Visit Summary was printed and given to the patient.  I, Neil Crouch have reviewed all documentation for this visit. The documentation on 07/22/22   for the exam, diagnosis, procedures, and orders are all accurate and complete.    Neil Crouch, DNP, [facility] [phone]

## 2022-07-22 NOTE — Telephone Encounter (Signed)
Stephanny called with complaints of dizziness.  She was seen at the ED on Monday and treated with meclizine.  She is complaining of difficulty walking when the dizziness is the worst.  She is scheduled for follow-up tomorrow morning.  She was instructed to go to the ED if her symptoms worsen before her appointment time.

## 2022-07-23 ENCOUNTER — Ambulatory Visit (INDEPENDENT_AMBULATORY_CARE_PROVIDER_SITE_OTHER): Payer: 59 | Admitting: Nurse Practitioner

## 2022-07-23 ENCOUNTER — Encounter: Payer: Self-pay | Admitting: Nurse Practitioner

## 2022-07-23 VITALS — BP 126/70 | HR 80 | Temp 97.0°F | Resp 18 | Ht 66.0 in | Wt 241.6 lb

## 2022-07-23 DIAGNOSIS — M25511 Pain in right shoulder: Secondary | ICD-10-CM | POA: Diagnosis not present

## 2022-07-23 DIAGNOSIS — R42 Dizziness and giddiness: Secondary | ICD-10-CM | POA: Diagnosis not present

## 2022-07-23 MED ORDER — CYCLOBENZAPRINE HCL 10 MG PO TABS: 10.0000 mg | ORAL_TABLET | Freq: Three times a day (TID) | ORAL | 0 refills | Status: DC | PRN

## 2022-07-23 NOTE — Assessment & Plan Note (Signed)
Continue Meclizine three times daily as needed Keep drinking lots of fluid and three healthy meals per day

## 2022-07-23 NOTE — Assessment & Plan Note (Signed)
Continue Tylenol 500 mg as needed Voltaren gel and ICE pack as needed Flexeril as needed Follow up as needed

## 2022-07-23 NOTE — Patient Instructions (Signed)

## 2022-07-24 ENCOUNTER — Other Ambulatory Visit: Payer: Self-pay | Admitting: Nurse Practitioner

## 2022-07-24 ENCOUNTER — Other Ambulatory Visit: Payer: Self-pay

## 2022-07-24 DIAGNOSIS — E559 Vitamin D deficiency, unspecified: Secondary | ICD-10-CM

## 2022-07-24 LAB — CBC WITH DIFFERENTIAL/PLATELET
Basophils Absolute: 0.1 10*3/uL (ref 0.0–0.2)
Basos: 1 %
EOS (ABSOLUTE): 0.3 10*3/uL (ref 0.0–0.4)
Eos: 3 %
Hematocrit: 34.4 % (ref 34.0–46.6)
Hemoglobin: 11.2 g/dL (ref 11.1–15.9)
Immature Grans (Abs): 0 10*3/uL (ref 0.0–0.1)
Immature Granulocytes: 0 %
Lymphocytes Absolute: 2.1 10*3/uL (ref 0.7–3.1)
Lymphs: 20 %
MCH: 29.1 pg (ref 26.6–33.0)
MCHC: 32.6 g/dL (ref 31.5–35.7)
MCV: 89 fL (ref 79–97)
Monocytes Absolute: 0.9 10*3/uL (ref 0.1–0.9)
Monocytes: 9 %
Neutrophils Absolute: 7.1 10*3/uL — ABNORMAL HIGH (ref 1.4–7.0)
Neutrophils: 67 %
Platelets: 592 10*3/uL — ABNORMAL HIGH (ref 150–450)
RBC: 3.85 x10E6/uL (ref 3.77–5.28)
RDW: 12.9 % (ref 11.7–15.4)
WBC: 10.4 10*3/uL (ref 3.4–10.8)

## 2022-07-24 LAB — VITAMIN D 25 HYDROXY (VIT D DEFICIENCY, FRACTURES): Vit D, 25-Hydroxy: 10.4 ng/mL — ABNORMAL LOW (ref 30.0–100.0)

## 2022-07-24 LAB — COMPREHENSIVE METABOLIC PANEL
ALT: 16 IU/L (ref 0–32)
AST: 14 IU/L (ref 0–40)
Albumin/Globulin Ratio: 1.5 (ref 1.2–2.2)
Albumin: 4.1 g/dL (ref 3.9–4.9)
Alkaline Phosphatase: 93 IU/L (ref 44–121)
BUN/Creatinine Ratio: 29 — ABNORMAL HIGH (ref 12–28)
BUN: 25 mg/dL (ref 8–27)
Bilirubin Total: 0.2 mg/dL (ref 0.0–1.2)
CO2: 23 mmol/L (ref 20–29)
Calcium: 10 mg/dL (ref 8.7–10.3)
Chloride: 99 mmol/L (ref 96–106)
Creatinine, Ser: 0.86 mg/dL (ref 0.57–1.00)
Globulin, Total: 2.7 g/dL (ref 1.5–4.5)
Glucose: 178 mg/dL — ABNORMAL HIGH (ref 70–99)
Potassium: 3.8 mmol/L (ref 3.5–5.2)
Sodium: 138 mmol/L (ref 134–144)
Total Protein: 6.8 g/dL (ref 6.0–8.5)
eGFR: 75 mL/min/{1.73_m2} (ref >59)

## 2022-07-24 LAB — IRON,TIBC AND FERRITIN PANEL
Ferritin: 28 ng/mL (ref 15–150)
Iron Saturation: 11 % — ABNORMAL LOW (ref 15–55)
Iron: 35 ug/dL (ref 27–139)
Total Iron Binding Capacity: 318 ug/dL (ref 250–450)
UIBC: 283 ug/dL (ref 118–369)

## 2022-07-24 LAB — VITAMIN B12: Vitamin B-12: 315 pg/mL (ref 232–1245)

## 2022-07-24 MED ORDER — VITAMIN D (ERGOCALCIFEROL) 1.25 MG (50000 UNIT) PO CAPS: ORAL_CAPSULE | ORAL | 3 refills | Status: DC

## 2022-07-25 ENCOUNTER — Other Ambulatory Visit: Payer: Self-pay | Admitting: Family Medicine

## 2022-07-25 DIAGNOSIS — B354 Tinea corporis: Secondary | ICD-10-CM

## 2022-07-28 ENCOUNTER — Telehealth: Payer: Self-pay

## 2022-07-28 NOTE — Telephone Encounter (Signed)
     Patient  visit on 3/4  at [facility]   Have you been able to follow up with your primary care physician?  The patient was or was not able to obtain any needed medicine or equipment.  Are there diet recommendations that you are having difficulty following?  Patient expresses understanding of discharge instructions and education provided has no other needs at this time.      [facility] [phone] [address], [place], [place] [address] Phone: [phone] Email: [email or web address]

## 2022-08-05 ENCOUNTER — Ambulatory Visit (INDEPENDENT_AMBULATORY_CARE_PROVIDER_SITE_OTHER): Payer: 59 | Admitting: Physician Assistant

## 2022-08-05 ENCOUNTER — Other Ambulatory Visit: Payer: Self-pay

## 2022-08-05 ENCOUNTER — Encounter: Payer: Self-pay | Admitting: Physician Assistant

## 2022-08-05 VITALS — BP 128/70 | HR 72 | Temp 97.0°F | Ht 66.0 in | Wt 244.0 lb

## 2022-08-05 DIAGNOSIS — E559 Vitamin D deficiency, unspecified: Secondary | ICD-10-CM

## 2022-08-05 DIAGNOSIS — I152 Hypertension secondary to endocrine disorders: Secondary | ICD-10-CM

## 2022-08-05 DIAGNOSIS — N3000 Acute cystitis without hematuria: Secondary | ICD-10-CM

## 2022-08-05 DIAGNOSIS — R600 Localized edema: Secondary | ICD-10-CM | POA: Diagnosis not present

## 2022-08-05 LAB — POCT URINALYSIS DIP (CLINITEK)
Bilirubin, UA: NEGATIVE
Blood, UA: NEGATIVE
Glucose, UA: NEGATIVE mg/dL
Nitrite, UA: NEGATIVE
POC PROTEIN,UA: NEGATIVE
Spec Grav, UA: 1.015 (ref 1.010–1.025)
Urobilinogen, UA: 0.2 E.U./dL
pH, UA: 6 (ref 5.0–8.0)

## 2022-08-05 MED ORDER — DOXYCYCLINE HYCLATE 100 MG PO TABS: 100.0000 mg | ORAL_TABLET | Freq: Two times a day (BID) | ORAL | 0 refills | Status: DC

## 2022-08-05 MED ORDER — VITAMIN D (ERGOCALCIFEROL) 1.25 MG (50000 UNIT) PO CAPS: ORAL_CAPSULE | ORAL | 3 refills | Status: DC

## 2022-08-05 MED ORDER — MUPIROCIN 2 % EX OINT: 1.0000 | TOPICAL_OINTMENT | Freq: Two times a day (BID) | CUTANEOUS | 0 refills | Status: DC

## 2022-08-05 MED ORDER — NOVOFINE PEN NEEDLE 32G X 6 MM MISC: 1.0000 | 3 refills | Status: DC

## 2022-08-05 NOTE — Progress Notes (Addendum)
Established Patient Office Visit  Subjective:  Patient ID: Alicia Kelly, female    DOB: 09/30/1955  Age: 67 y.o. MRN: [id number]  CC:  Chief Complaint  Patient presents with   Follow-up    Bilateral Leg Edema    HPI Alicia Kelly presents for follow up of leg edema Pt states that she is still having some edema in lower legs - at times better than others.  Does have some small areas that blister and weep She is taking lasix 20mg  bid and tries to keep legs elevated She cannot put on support hose Pt is hesitant to use UNNA boots however this would greatly help Pt does not want to go back to wound therapy at [facility] where she has been for other reason in past  Pt also here for recheck uti - still having some mild urgency and frequency Past Medical History:  Diagnosis Date   Asthma    Calculus of gallbladder with chronic cholecystitis without obstruction 03/22/2017   Diabetes ([place])    Epilepsy ([place])    Hypertension    Sleep apnea     Past Surgical History:  Procedure Laterality Date   APPENDECTOMY     CHOLECYSTECTOMY     TONSILLECTOMY      Family History  Problem Relation Age of Onset   Colon cancer Mother        d. 45   Hypertension Father    Diabetes Father    Allergic rhinitis Sister    Hypertension Sister    Cancer - Colon Maternal Aunt    Cancer Maternal Uncle    Colon cancer Maternal Grandmother        dx 28s   Colon cancer Paternal Grandmother        dx 37s   Diabetes Paternal Grandmother    Breast cancer Neg Hx     Social History   Socioeconomic History   Marital status: Widowed    Spouse name: Not on file   Number of children: 0   Years of education: Not on file   Highest education level: 10th grade  Occupational History   Occupation: Disabled  Tobacco Use   Smoking status: Never   Smokeless tobacco: Never  Vaping Use   Vaping Use: Never used  Substance and Sexual Activity   Alcohol use: Never   Drug use: Never   Sexual  activity: Not Currently  Other Topics Concern   Not on file  Social History Narrative   Spouse passed away 10 yrs ago -- no children   Social Determinants of Health   Financial Resource Strain: Low Risk  (07/07/2022)   Overall Financial Resource Strain (CARDIA)    Difficulty of Paying Living Expenses: Not hard at all  Food Insecurity: No Food Insecurity (07/07/2022)   Hunger Vital Sign    Worried About Running Out of Food in the Last Year: Never true    [place] in the Last Year: Never true  Transportation Needs: No Transportation Needs (07/07/2022)   PRAPARE - Hydrologist (Medical): No    Lack of Transportation (Non-Medical): No  Physical Activity: Sufficiently Active (07/07/2022)   Exercise Vital Sign    Days of Exercise per Week: 5 days    Minutes of Exercise per Session: 30 min  Stress: No Stress Concern Present (07/07/2022)   [place]    Feeling of Stress : Not at all  Social  Connections: Socially Isolated (07/07/2022)   Social Connection and Isolation Panel [NHANES]    Frequency of Communication with Friends and Family: More than three times a week    Frequency of Social Gatherings with Friends and Family: Once a week    Attends Religious Services: Never    Marine scientist or Organizations: No    Attends Archivist Meetings: Never    Marital Status: Widowed  Intimate Partner Violence: Not At Risk (07/07/2022)   Humiliation, Afraid, Rape, and Kick questionnaire    Fear of Current or Ex-Partner: No    Emotionally Abused: No    Physically Abused: No    Sexually Abused: No     Current Outpatient Medications:    Accu-Chek Softclix Lancets lancets, TEST BLOOD SUGAR THREE TIMES DAILY, Disp: 300 each, Rfl: 1   atorvastatin (LIPITOR) 40 MG tablet, Take 1 tablet (40 mg total) by mouth daily., Disp: 90 tablet, Rfl: 0   B-D UF III MINI PEN NEEDLES 31G X 5 MM MISC,  USE 1 PEN NEEDLE DAILY, Disp: 100 each, Rfl: 2   Blood Glucose Calibration (ACCU-CHEK AVIVA) SOLN, , Disp: , Rfl:    Blood Glucose Monitoring Suppl (ACCU-CHEK AVIVA PLUS) w/Device KIT, 1 each by Does not apply route 3 (three) times daily., Disp: 1 kit, Rfl: 0   clotrimazole-betamethasone (LOTRISONE) cream, Use 1 Application topically daily., Disp: 30 g, Rfl: 2   cyclobenzaprine (FLEXERIL) 10 MG tablet, Take 1 tablet (10 mg total) by mouth 3 (three) times daily as needed for muscle spasms., Disp: 30 tablet, Rfl: 0   doxycycline (VIBRA-TABS) 100 MG tablet, Take 1 tablet (100 mg total) by mouth 2 (two) times daily., Disp: 20 tablet, Rfl: 0   fluticasone (FLONASE) 50 MCG/ACT nasal spray, Place 2 sprays into both nostrils daily., Disp: , Rfl:    furosemide (LASIX) 20 MG tablet, Take 1 tablet (20 mg total) by mouth 2 (two) times daily., Disp: 30 tablet, Rfl: 3   glucose blood (ACCU-CHEK GUIDE) test strip, Check sugars threes times daily, Disp: 300 each, Rfl: 3   Insulin Pen Needle (NOVOFINE PEN NEEDLE) 32G X 6 MM MISC, 1 each by Does not apply route every 7 (seven) days., Disp: 50 each, Rfl: 3   lacosamide (VIMPAT) 200 MG TABS tablet, Take 1.5 tablets (300 mg total) by mouth 2 (two) times daily., Disp: 180 tablet, Rfl: 2   levETIRAcetam (KEPPRA) 1000 MG tablet, Take 1 tablet (1,000 mg total) by mouth in the morning, at noon, and at bedtime., Disp: 90 tablet, Rfl: 6   losartan-hydrochlorothiazide (HYZAAR) 100-25 MG tablet, Take 1 tablet by mouth daily., Disp: 90 tablet, Rfl: 2   meclizine (ANTIVERT) 25 MG tablet, Take 25 mg by mouth every 6 (six) hours as needed for dizziness., Disp: , Rfl:    montelukast (SINGULAIR) 10 MG tablet, Take 1 tablet (10 mg total) by mouth daily. as directed, Disp: 90 tablet, Rfl: 2   mupirocin ointment (BACTROBAN) 2 %, Apply 1 Application topically 2 (two) times daily., Disp: 22 g, Rfl: 0   ondansetron (ZOFRAN-ODT) 4 MG disintegrating tablet, , Disp: , Rfl:    pantoprazole  (PROTONIX) 40 MG tablet, Take 1 tablet (40 mg total) by mouth 2 (two) times daily., Disp: 180 tablet, Rfl: 1   Semaglutide, 2 MG/DOSE, 8 MG/3ML SOPN, Inject 2 mg as directed once a week., Disp: 3 mL, Rfl: 5   TRESIBA FLEXTOUCH 100 UNIT/ML FlexTouch Pen, Inject 30 Units into the skin daily., Disp: 3 mL,  Rfl: 2   Vitamin D, Ergocalciferol, (DRISDOL) 1.25 MG (50000 UNIT) CAPS capsule, Twice a week, Disp: 8 capsule, Rfl: 3   Allergies  Allergen Reactions   Levofloxacin Other (See Comments)    Throat swelling   Lisinopril Hives   Nitrofurantoin Swelling    Throat swelling   Penicillins Anaphylaxis   Clarithromycin Other (See Comments)    seizures   Sulfamethoxazole-Trimethoprim     Causes seizures    ROS CONSTITUTIONAL: Negative for chills, fatigue, fever,  CARDIOVASCULAR: Negative for chest pain, - is having lower extremity swelling RESPIRATORY: Negative for recent cough and dyspnea.  GASTROINTESTINAL: Negative for abdominal pain, acid reflux symptoms, constipation, diarrhea, nausea and vomiting.  GU - see HPI INTEGUMENTARY: see HPI        Objective:   PHYSICAL EXAM:   VS: BP 128/70 (BP Location: Left Arm, Patient Position: Sitting, Cuff Size: Large)   Pulse 72   Temp (!) 97 F (36.1 C) (Temporal)   Ht 5\' 6"  (1.676 m)   Wt 244 lb (110.7 kg)   LMP  (LMP Unknown)   SpO2 92%   BMI 39.38 kg/m   GEN: Well nourished, well developed, in no acute distress  Cardiac: RRR; no murmurs, rubs, or gallops, - 2 + bilateral lower extremity edema Respiratory:  normal respiratory rate and pattern with no distress - normal breath sounds with no rales, rhonchi, wheezes or rubs MS: no deformity or atrophy  Skin: small blisters noted on lower legs    Health Maintenance Due  Topic Date Due   OPHTHALMOLOGY EXAM  Never done   PAP SMEAR-Modifier  Never done   COVID-19 Vaccine (6 - 2023-24 season) 07/28/2022    There are no preventive care reminders to display for this patient.  Lab  Results  Component Value Date   TSH 1.210 06/18/2022   Lab Results  Component Value Date   WBC 10.4 07/23/2022   HGB 11.2 07/23/2022   HCT 34.4 07/23/2022   MCV 89 07/23/2022   PLT 592 (H) 07/23/2022   Lab Results  Component Value Date   NA 138 07/23/2022   K 3.8 07/23/2022   CO2 23 07/23/2022   GLUCOSE 178 (H) 07/23/2022   BUN 25 07/23/2022   CREATININE 0.86 07/23/2022   BILITOT 0.2 07/23/2022   ALKPHOS 93 07/23/2022   AST 14 07/23/2022   ALT 16 07/23/2022   PROT 6.8 07/23/2022   ALBUMIN 4.1 07/23/2022   CALCIUM 10.0 07/23/2022   EGFR 75 07/23/2022   Lab Results  Component Value Date   CHOL 118 06/18/2022   Lab Results  Component Value Date   HDL 40 06/18/2022   Lab Results  Component Value Date   LDLCALC 49 06/18/2022   Lab Results  Component Value Date   TRIG 172 (H) 06/18/2022   Lab Results  Component Value Date   CHOLHDL 3.0 06/18/2022   Lab Results  Component Value Date   HGBA1C 8.4 (H) 06/18/2022      Assessment & Plan:   Problem List Items Addressed This Visit       Other   Bilateral leg edema Bilateral unna boots applied   Other Visit Diagnoses     Acute cystitis without hematuria    -  Primary   Relevant Medications   doxycycline (VIBRA-TABS) 100 MG tablet   Other Relevant Orders   Urine Culture   POCT URINALYSIS DIP (CLINITEK) (Completed)   Vitamin D deficiency       Relevant Medications  Vitamin D, Ergocalciferol, (DRISDOL) 1.25 MG (50000 UNIT) CAPS capsule       Meds ordered this encounter  Medications   Vitamin D, Ergocalciferol, (DRISDOL) 1.25 MG (50000 UNIT) CAPS capsule    Sig: Twice a week    Dispense:  8 capsule    Refill:  3    Order Specific Question:   Supervising Provider    Answer:   Shelton Silvas   doxycycline (VIBRA-TABS) 100 MG tablet    Sig: Take 1 tablet (100 mg total) by mouth 2 (two) times daily.    Dispense:  20 tablet    Refill:  0    Order Specific Question:   Supervising Provider     Answer:   Shelton Silvas   mupirocin ointment (BACTROBAN) 2 %    Sig: Apply 1 Application topically 2 (two) times daily.    Dispense:  22 g    Refill:  0    Order Specific Question:   Supervising Provider    AnswerShelton Silvas    Follow-up: Return for change appt from June to sometime in may - also recheck next Monday - 72min.    SARA R Emmerich Cryer, PA-C

## 2022-08-06 ENCOUNTER — Other Ambulatory Visit: Payer: Self-pay

## 2022-08-06 ENCOUNTER — Telehealth: Payer: Self-pay

## 2022-08-06 DIAGNOSIS — E119 Type 2 diabetes mellitus without complications: Secondary | ICD-10-CM

## 2022-08-06 LAB — URINE CULTURE

## 2022-08-06 MED ORDER — BD PEN NEEDLE MINI U/F 31G X 5 MM MISC: 2 refills | Status: DC

## 2022-08-06 NOTE — Telephone Encounter (Signed)
Patient states she did take the unna boots off and stated that her legs were tingling and that they were stinging and she couldn't leave them on any longer so took them off this morning and then she noticed there were several more red spots on her legs. Provider informed and per [name] she reccommended patient referral to wound care center. Patient refuses to go to [facility] wound care center because she states that they were ugly to her, and then she refuses to go to [facility] and or [place] wound center because she states she is Epileptic and she can't drive and her sister takes her places but she has to drive from [place] and can't come every day. She states that she refuses to wear the stockings, refuses to wear the unna boots to make them better and then refuses wound care. Per provider [name] patient is to continue to elevate her legs because that is the only recommendation that we can make since she refuses all help offered and no reason for patient to follow up on the matter. Patient appointment cancelled and patient informed and understood verbally.

## 2022-08-06 NOTE — Telephone Encounter (Signed)
Patient called stating that after having her legs dressed yesterday in the office, she states she now has 3x more spots on her legs and wanted to let you be aware that now there is more of them.

## 2022-08-10 ENCOUNTER — Other Ambulatory Visit: Payer: Self-pay

## 2022-08-10 ENCOUNTER — Ambulatory Visit: Payer: 59 | Admitting: Physician Assistant

## 2022-08-10 NOTE — Telephone Encounter (Signed)
Alicia Kelly called with complaints of a lump between her breast. Symptoms discussed with [name], PA and appointment scheduled.

## 2022-08-12 ENCOUNTER — Ambulatory Visit: Payer: 59 | Admitting: Physician Assistant

## 2022-08-12 DIAGNOSIS — J45909 Unspecified asthma, uncomplicated: Secondary | ICD-10-CM | POA: Insufficient documentation

## 2022-08-12 DIAGNOSIS — E119 Type 2 diabetes mellitus without complications: Secondary | ICD-10-CM | POA: Insufficient documentation

## 2022-08-12 DIAGNOSIS — E1159 Type 2 diabetes mellitus with other circulatory complications: Secondary | ICD-10-CM | POA: Insufficient documentation

## 2022-08-14 ENCOUNTER — Encounter: Payer: Self-pay | Admitting: Physician Assistant

## 2022-08-14 ENCOUNTER — Ambulatory Visit (INDEPENDENT_AMBULATORY_CARE_PROVIDER_SITE_OTHER): Payer: 59 | Admitting: Physician Assistant

## 2022-08-14 VITALS — BP 120/70 | HR 96 | Temp 97.1°F | Ht 66.0 in | Wt 240.4 lb

## 2022-08-14 DIAGNOSIS — S46001D Unspecified injury of muscle(s) and tendon(s) of the rotator cuff of right shoulder, subsequent encounter: Secondary | ICD-10-CM

## 2022-08-14 DIAGNOSIS — R600 Localized edema: Secondary | ICD-10-CM

## 2022-08-14 DIAGNOSIS — R222 Localized swelling, mass and lump, trunk: Secondary | ICD-10-CM | POA: Diagnosis not present

## 2022-08-14 DIAGNOSIS — S46001A Unspecified injury of muscle(s) and tendon(s) of the rotator cuff of right shoulder, initial encounter: Secondary | ICD-10-CM | POA: Insufficient documentation

## 2022-08-14 DIAGNOSIS — M25511 Pain in right shoulder: Secondary | ICD-10-CM | POA: Diagnosis not present

## 2022-08-14 MED ORDER — TORSEMIDE 10 MG PO TABS: 10.0000 mg | ORAL_TABLET | Freq: Two times a day (BID) | ORAL | 1 refills | Status: DC

## 2022-08-14 NOTE — Progress Notes (Signed)
Established Patient Office Visit  Subjective:  Patient ID: Alicia Kelly, female    DOB: May 06, 1956  Age: 67 y.o. MRN: [id number]  CC:  Chief Complaint  Patient presents with   Breast Problem    HPI Nyaja Barreau presents for complaints of mass on chest Pt states that she has felt a 'knot' below her right breast for about a year but thinks it is getting larger and now tender to the touch - denies any injury or trauma  Pt is still having swelling of lower legs - she had unna boots applied a few weeks ago however she cut them off less than 12 hours later because they caused itching.  She has been recommended to see wound therapy for her legs as well because she has some weeping and small open wounds but patient refuses.  She was also sent to get lymphedema therapy done however she was turned away because of the weeping.  Recommend at this point to try a different diuretic to see if helpful  Pt states that she saw another provider several weeks ago for complaints of right shoulder pain.  She says it started after pushing back in her recliner and she felt and heard a pop in her right anterior shoulder.  She has tried ibuprofen and tylenol but still complains of pain.  She also now cannot lift her right arm  --- recommend ortho referral  Past Medical History:  Diagnosis Date   Asthma    Calculus of gallbladder with chronic cholecystitis without obstruction 03/22/2017   Diabetes ([place])    Epilepsy ([place])    Hypertension    Sleep apnea     Past Surgical History:  Procedure Laterality Date   APPENDECTOMY     CHOLECYSTECTOMY     TONSILLECTOMY      Family History  Problem Relation Age of Onset   Colon cancer Mother        d. 70   Hypertension Father    Diabetes Father    Allergic rhinitis Sister    Hypertension Sister    Cancer - Colon Maternal Aunt    Cancer Maternal Uncle    Colon cancer Maternal Grandmother        dx 45s   Colon cancer Paternal Grandmother        dx  75s   Diabetes Paternal Grandmother    Breast cancer Neg Hx     Social History   Socioeconomic History   Marital status: Widowed    Spouse name: Not on file   Number of children: 0   Years of education: Not on file   Highest education level: 10th grade  Occupational History   Occupation: Disabled  Tobacco Use   Smoking status: Never   Smokeless tobacco: Never  Vaping Use   Vaping Use: Never used  Substance and Sexual Activity   Alcohol use: Never   Drug use: Never   Sexual activity: Not Currently  Other Topics Concern   Not on file  Social History Narrative   Spouse passed away 10 yrs ago -- no children   Social Determinants of Health   Financial Resource Strain: Low Risk  (07/07/2022)   Overall Financial Resource Strain (CARDIA)    Difficulty of Paying Living Expenses: Not hard at all  Food Insecurity: No Food Insecurity (07/07/2022)   Hunger Vital Sign    Worried About Running Out of Food in the Last Year: Never true    [place] in  the Last Year: Never true  Transportation Needs: No Transportation Needs (07/07/2022)   PRAPARE - Hydrologist (Medical): No    Lack of Transportation (Non-Medical): No  Physical Activity: Sufficiently Active (07/07/2022)   Exercise Vital Sign    Days of Exercise per Week: 5 days    Minutes of Exercise per Session: 30 min  Stress: No Stress Concern Present (07/07/2022)   [place]    Feeling of Stress : Not at all  Social Connections: Socially Isolated (07/07/2022)   Social Connection and Isolation Panel [NHANES]    Frequency of Communication with Friends and Family: More than three times a week    Frequency of Social Gatherings with Friends and Family: Once a week    Attends Religious Services: Never    Marine scientist or Organizations: No    Attends Archivist Meetings: Never    Marital Status: Widowed  Intimate  Partner Violence: Not At Risk (07/07/2022)   Humiliation, Afraid, Rape, and Kick questionnaire    Fear of Current or Ex-Partner: No    Emotionally Abused: No    Physically Abused: No    Sexually Abused: No     Current Outpatient Medications:    Accu-Chek Softclix Lancets lancets, TEST BLOOD SUGAR THREE TIMES DAILY, Disp: 300 each, Rfl: 1   atorvastatin (LIPITOR) 40 MG tablet, Take 1 tablet (40 mg total) by mouth daily., Disp: 90 tablet, Rfl: 0   Blood Glucose Calibration (ACCU-CHEK AVIVA) SOLN, , Disp: , Rfl:    Blood Glucose Monitoring Suppl (ACCU-CHEK AVIVA PLUS) w/Device KIT, 1 each by Does not apply route 3 (three) times daily., Disp: 1 kit, Rfl: 0   clotrimazole-betamethasone (LOTRISONE) cream, Use 1 Application topically daily., Disp: 30 g, Rfl: 2   cyclobenzaprine (FLEXERIL) 10 MG tablet, Take 1 tablet (10 mg total) by mouth 3 (three) times daily as needed for muscle spasms., Disp: 30 tablet, Rfl: 0   doxycycline (VIBRA-TABS) 100 MG tablet, Take 1 tablet (100 mg total) by mouth 2 (two) times daily., Disp: 20 tablet, Rfl: 0   fluticasone (FLONASE) 50 MCG/ACT nasal spray, Place 2 sprays into both nostrils daily., Disp: , Rfl:    glucose blood (ACCU-CHEK GUIDE) test strip, Check sugars threes times daily, Disp: 300 each, Rfl: 3   Insulin Pen Needle (B-D UF III MINI PEN NEEDLES) 31G X 5 MM MISC, USE 1 PEN NEEDLE DAILY, Disp: 100 each, Rfl: 2   lacosamide (VIMPAT) 200 MG TABS tablet, Take 1.5 tablets (300 mg total) by mouth 2 (two) times daily., Disp: 180 tablet, Rfl: 2   levETIRAcetam (KEPPRA) 1000 MG tablet, Take 1 tablet (1,000 mg total) by mouth in the morning, at noon, and at bedtime., Disp: 90 tablet, Rfl: 6   losartan-hydrochlorothiazide (HYZAAR) 100-25 MG tablet, Take 1 tablet by mouth daily., Disp: 90 tablet, Rfl: 2   meclizine (ANTIVERT) 25 MG tablet, Take 25 mg by mouth every 6 (six) hours as needed for dizziness., Disp: , Rfl:    montelukast (SINGULAIR) 10 MG tablet, Take 1  tablet (10 mg total) by mouth daily. as directed, Disp: 90 tablet, Rfl: 2   mupirocin ointment (BACTROBAN) 2 %, Apply 1 Application topically 2 (two) times daily., Disp: 22 g, Rfl: 0   ondansetron (ZOFRAN-ODT) 4 MG disintegrating tablet, , Disp: , Rfl:    pantoprazole (PROTONIX) 40 MG tablet, Take 1 tablet (40 mg total) by mouth 2 (two) times daily.,  Disp: 180 tablet, Rfl: 1   Semaglutide, 2 MG/DOSE, 8 MG/3ML SOPN, Inject 2 mg as directed once a week., Disp: 3 mL, Rfl: 5   torsemide (DEMADEX) 10 MG tablet, Take 1 tablet (10 mg total) by mouth 2 (two) times daily., Disp: 60 tablet, Rfl: 1   TRESIBA FLEXTOUCH 100 UNIT/ML FlexTouch Pen, Inject 30 Units into the skin daily., Disp: 3 mL, Rfl: 2   Vitamin D, Ergocalciferol, (DRISDOL) 1.25 MG (50000 UNIT) CAPS capsule, Twice a week, Disp: 8 capsule, Rfl: 3   Allergies  Allergen Reactions   Levofloxacin Other (See Comments)    Throat swelling   Lisinopril Hives   Nitrofurantoin Swelling    Throat swelling   Penicillins Anaphylaxis   Clarithromycin Other (See Comments)    seizures   Sulfamethoxazole-Trimethoprim     Causes seizures    ROS CONSTITUTIONAL: Negative for chills, fatigue, fever, unintentional weight gain and unintentional weight loss.  E/N/T: Negative for ear pain, nasal congestion and sore throat.  CARDIOVASCULAR: Negative for chest pain, dizziness, palpitations -  does have edema RESPIRATORY: Negative for recent cough and dyspnea.  GASTROINTESTINAL: Negative for abdominal pain, acid reflux symptoms, constipation, diarrhea, nausea and vomiting.  MSK: see HPI INTEGUMENTARY: Negative for rash.      Objective:    PHYSICAL EXAM:   VS: BP 120/70 (BP Location: Left Arm, Patient Position: Sitting, Cuff Size: Large)   Pulse 96   Temp (!) 97.1 F (36.2 C) (Temporal)   Ht 5\' 6"  (1.676 m)   Wt 240 lb 6.4 oz (109 kg)   LMP  (LMP Unknown)   SpO2 97%   BMI 38.80 kg/m   GEN: Well nourished, well developed, in no acute distress   Cardiac: RRR; no murmurs, rubs, or gallops,- 2 + edema with weeping - superficial erosions noted with clear fluid leakage Respiratory:  normal respiratory rate and pattern with no distress - normal breath sounds with no rales, rhonchi, wheezes or rubs MS: right anterior shoulder tender to palpation and rom diminshed - not able to extend right arm---- chest wall with large 'knot' at base of right rib cage - possibly xiphoid process but enlarged and toward right side Skin: warm and dry, no rash  Psych: euthymic mood, appropriate affect and demeanor      Health Maintenance Due  Topic Date Due   OPHTHALMOLOGY EXAM  Never done   PAP SMEAR-Modifier  Never done   COVID-19 Vaccine (6 - 2023-24 season) 07/28/2022    There are no preventive care reminders to display for this patient.  Lab Results  Component Value Date   TSH 1.210 06/18/2022   Lab Results  Component Value Date   WBC 10.4 07/23/2022   HGB 11.2 07/23/2022   HCT 34.4 07/23/2022   MCV 89 07/23/2022   PLT 592 (H) 07/23/2022   Lab Results  Component Value Date   NA 138 07/23/2022   K 3.8 07/23/2022   CO2 23 07/23/2022   GLUCOSE 178 (H) 07/23/2022   BUN 25 07/23/2022   CREATININE 0.86 07/23/2022   BILITOT 0.2 07/23/2022   ALKPHOS 93 07/23/2022   AST 14 07/23/2022   ALT 16 07/23/2022   PROT 6.8 07/23/2022   ALBUMIN 4.1 07/23/2022   CALCIUM 10.0 07/23/2022   EGFR 75 07/23/2022   Lab Results  Component Value Date   CHOL 118 06/18/2022   Lab Results  Component Value Date   HDL 40 06/18/2022   Lab Results  Component Value Date   LDLCALC  49 06/18/2022   Lab Results  Component Value Date   TRIG 172 (H) 06/18/2022   Lab Results  Component Value Date   CHOLHDL 3.0 06/18/2022   Lab Results  Component Value Date   HGBA1C 8.4 (H) 06/18/2022      Assessment & Plan:   Problem List Items Addressed This Visit       Musculoskeletal and Integument   Injury of right rotator cuff   Relevant Orders    Ambulatory referral to Orthopedics     Other   Bilateral leg edema   Relevant Medications   torsemide (DEMADEX) 10 MG tablet bid STOP LASIX   Acute pain of right shoulder - Primary   Relevant Orders   Ambulatory referral to Orthopedics   Mass of chest wall, right   Relevant Orders   DG Chest 2 View    Meds ordered this encounter  Medications   torsemide (DEMADEX) 10 MG tablet    Sig: Take 1 tablet (10 mg total) by mouth 2 (two) times daily.    Dispense:  60 tablet    Refill:  1    Order Specific Question:   Supervising Provider    Answer:   Shelton Silvas    Follow-up: Return in about 3 weeks (around 09/04/2022) for follow up - 40 min.    SARA R Emili Mcloughlin, PA-C

## 2022-08-16 ENCOUNTER — Encounter: Payer: Self-pay | Admitting: Oncology

## 2022-08-17 ENCOUNTER — Other Ambulatory Visit: Payer: Self-pay | Admitting: Physician Assistant

## 2022-08-17 ENCOUNTER — Encounter: Payer: Self-pay | Admitting: Oncology

## 2022-08-17 DIAGNOSIS — R222 Localized swelling, mass and lump, trunk: Secondary | ICD-10-CM

## 2022-08-18 ENCOUNTER — Other Ambulatory Visit: Payer: Self-pay

## 2022-08-18 ENCOUNTER — Encounter: Payer: Self-pay | Admitting: Oncology

## 2022-08-18 DIAGNOSIS — R222 Localized swelling, mass and lump, trunk: Secondary | ICD-10-CM

## 2022-08-18 NOTE — Progress Notes (Unsigned)
Cardiology Office Note:    Date:  08/19/2022   ID:  Alicia Kelly, [name] 1955-06-20, MRN [id number]  PCP:  [name], PA-C  Cardiologist:  Shirlee More, MD   Referring MD: [name], PA-C  ASSESSMENT:    1. Swelling of both lower extremities   2. Hypertensive heart disease, unspecified whether heart failure present    PLAN:    In order of problems listed above:  Is a very complex case with longstanding lower extremity edema worsened.  Clinically she does not appear to have left heart failure either by physical exam EKG symptoms are proBNP level differential diagnosis would include pseudo heart failure from untreated obstructive sleep apnea pulmonary artery hypertension with right heart failure pericardial disease with  cardiac constriction and even unusually restrictive cardiomyopathy.  I think she needs an echocardiogram to sort these issues and if normal I do not think she requires further cardiac evaluation.  The differential diagnosis also include chronic venous disease she is taking a loop diuretic that does not seem to be effective.  Renal and hepatic causes she may benefit from comprehensive vein evaluation. Stable continue her current antihypertensive agent losartan I think she will requires referral back for sleep apnea treatment She has a bony mass that found a x-ray from [facility] in 2017 that showed that she should be get the CT scan done  Next appointment 3 months   Medication Adjustments/Labs and Tests Ordered: Current medicines are reviewed at length with the patient today.  Concerns regarding medicines are outlined above.  Orders Placed This Encounter  Procedures   ECHOCARDIOGRAM COMPLETE   No orders of the defined types were placed in this encounter.    Chief Complaint  Patient presents with   Edema    History of Present Illness:    Alicia Kelly is a 67 y.o. female who is being seen today for the evaluation of lower extremity edema she  had a chest x-ray at [facility] 08/14/2022 normal no findings of heart failure.  At the request of [name], PA-C.  She was seen by me 08/01/2018 for chest pain felt to represent recurrent or chronic costochondral pain syndrome.  Other problems include hypertension type 2 diabetes [place] hyperlipidemia and coronary artery calcification which was described as scant on a CT scan.  She had previous evaluation as an inpatient [facility] 07/07/2019 with a myocardial perfusion study showing no ischemia and normal left ventricular function low risk.  Chart review shows she is iron deficient with a saturation of 11% and a ferritin of 28.  Hemoglobin normal 11.2 creatinine 0.86 potassium 3.8  proBNP level was 38 in the rule out range for heart failure.  This is a very complex visit.  She recognizes me decades ago I take care of her husband she send I had seen her many years ago when she had a stress test done likely as an outpatient that was normal continue to me she was at [facility] 1 to 2 years ago because of lower extremity edema and she is already downstairs in the cardiopulmonary department.  I do not have those records her husband tells me she has had a duplex of her legs performed there open a search to see if we can find those records Initially she said she has had leg swelling for 6 months but in retrospect it has been there for years and it has probably worsened in the last 6 months she has had venous ulceration in the  legs. She has no known history of heart disease congenital rheumatic or atrial fibrillation her knowledge is that her previous cardiac testing was normal She has shortness of breath at times but she is really limited by gait being unsteady and heaviness in her legs and is very sedentary She tells me she has sleep apnea is untreated and she sleeps in a chair that minimizes her snoring.  Her husband tells me she snores severely She has no known history of  pericardial or lung disease proteinuria or liver disease. She is sent to see me today regarding edema with concerns of heart failure She not having chest pain palpitation syncope or orthopnea Relates a chronic cough and sputum but no wheezing Past Medical History:  Diagnosis Date   Asthma    Calculus of gallbladder with chronic cholecystitis without obstruction 03/22/2017   Diabetes    Epilepsy    Hypertension    Sleep apnea     Past Surgical History:  Procedure Laterality Date   APPENDECTOMY     CHOLECYSTECTOMY     TONSILLECTOMY      Current Medications: Current Meds  Medication Sig   Accu-Chek Softclix Lancets lancets TEST BLOOD SUGAR THREE TIMES DAILY   atorvastatin (LIPITOR) 40 MG tablet Take 1 tablet (40 mg total) by mouth daily.   Blood Glucose Calibration (ACCU-CHEK AVIVA) SOLN    Blood Glucose Monitoring Suppl (ACCU-CHEK AVIVA PLUS) w/Device KIT 1 each by Does not apply route 3 (three) times daily.   clotrimazole-betamethasone (LOTRISONE) cream Use 1 Application topically daily.   cyclobenzaprine (FLEXERIL) 10 MG tablet Take 1 tablet (10 mg total) by mouth 3 (three) times daily as needed for muscle spasms.   doxycycline (VIBRA-TABS) 100 MG tablet Take 1 tablet (100 mg total) by mouth 2 (two) times daily.   fluticasone (FLONASE) 50 MCG/ACT nasal spray Place 2 sprays into both nostrils daily.   furosemide (LASIX) 20 MG tablet Take 20 mg by mouth 2 (two) times daily.   glucose blood (ACCU-CHEK GUIDE) test strip Check sugars threes times daily   Insulin Pen Needle (B-D UF III MINI PEN NEEDLES) 31G X 5 MM MISC USE 1 PEN NEEDLE DAILY   lacosamide (VIMPAT) 200 MG TABS tablet Take 1.5 tablets (300 mg total) by mouth 2 (two) times daily.   levETIRAcetam (KEPPRA) 1000 MG tablet Take 1 tablet (1,000 mg total) by mouth in the morning, at noon, and at bedtime.   losartan-hydrochlorothiazide (HYZAAR) 100-25 MG tablet Take 1 tablet by mouth daily.   meclizine (ANTIVERT) 25 MG tablet  Take 25 mg by mouth every 6 (six) hours as needed for dizziness.   montelukast (SINGULAIR) 10 MG tablet Take 1 tablet (10 mg total) by mouth daily. as directed   mupirocin ointment (BACTROBAN) 2 % Apply 1 Application topically 2 (two) times daily.   ondansetron (ZOFRAN-ODT) 4 MG disintegrating tablet    pantoprazole (PROTONIX) 40 MG tablet Take 1 tablet (40 mg total) by mouth 2 (two) times daily.   Semaglutide, 2 MG/DOSE, 8 MG/3ML SOPN Inject 2 mg as directed once a week.   TRESIBA FLEXTOUCH 100 UNIT/ML FlexTouch Pen Inject 30 Units into the skin daily.   Vitamin D, Ergocalciferol, (DRISDOL) 1.25 MG (50000 UNIT) CAPS capsule Twice a week     Allergies:   Levofloxacin, Lisinopril, Nitrofurantoin, Penicillins, Clarithromycin, and Sulfamethoxazole-trimethoprim   Social History   Socioeconomic History   Marital status: Widowed    Spouse name: Not on file   Number of children: 0  Years of education: Not on file   Highest education level: 10th grade  Occupational History   Occupation: Disabled  Tobacco Use   Smoking status: Never   Smokeless tobacco: Never  Vaping Use   Vaping Use: Never used  Substance and Sexual Activity   Alcohol use: Never   Drug use: Never   Sexual activity: Not Currently  Other Topics Concern   Not on file  Social History Narrative   Spouse passed away 10 yrs ago -- no children   Social Determinants of Health   Financial Resource Strain: Low Risk  (07/07/2022)   Overall Financial Resource Strain (CARDIA)    Difficulty of Paying Living Expenses: Not hard at all  Food Insecurity: No Food Insecurity (07/07/2022)   Hunger Vital Sign    Worried About Running Out of Food in the Last Year: Never true    [place] in the Last Year: Never true  Transportation Needs: No Transportation Needs (07/07/2022)   PRAPARE - Hydrologist (Medical): No    Lack of Transportation (Non-Medical): No  Physical Activity: Sufficiently Active  (07/07/2022)   Exercise Vital Sign    Days of Exercise per Week: 5 days    Minutes of Exercise per Session: 30 min  Stress: No Stress Concern Present (07/07/2022)   [place]    Feeling of Stress : Not at all  Social Connections: Socially Isolated (07/07/2022)   Social Connection and Isolation Panel [NHANES]    Frequency of Communication with Friends and Family: More than three times a week    Frequency of Social Gatherings with Friends and Family: Once a week    Attends Religious Services: Never    Marine scientist or Organizations: No    Attends Archivist Meetings: Never    Marital Status: Widowed     Family History: The patient's family history includes Allergic rhinitis in her sister; Cancer in her maternal uncle; Cancer - Colon in her maternal aunt; Colon cancer in her maternal grandmother, mother, and paternal grandmother; Diabetes in her father and paternal grandmother; Hypertension in her father and sister. There is no history of Breast cancer.  ROS:   ROS Please see the history of present illness.     All other systems reviewed and are negative.  EKGs/Labs/Other Studies Reviewed:    The following studies were reviewed today:  She had a lower extremity venous duplex to exclude DVT study done at [facility] in [place] January 2023 with no findings of DVT but was limited in the proximal superficial veins were poorly visualized Cardiac Studies & Procedures     STRESS TESTS  MYOCARDIAL PERFUSION IMAGING 02/26/2021               Recent Labs: 06/18/2022: NT-Pro BNP 38; TSH 1.210 07/23/2022: ALT 16; BUN 25; Creatinine, Ser 0.86; Hemoglobin 11.2; Platelets 592; Potassium 3.8; Sodium 138  Recent Lipid Panel    Component Value Date/Time   CHOL 118 06/18/2022 1045   TRIG 172 (H) 06/18/2022 1045   HDL 40 06/18/2022 1045   CHOLHDL 3.0 06/18/2022 1045   LDLCALC 49 06/18/2022 1045     Physical Exam:    VS:  BP 120/74 (BP Location: Left Arm, Patient Position: Sitting, Cuff Size: Large)   Pulse 78   Ht 5\' 6"  (1.676 m)   Wt 239 lb (108.4 kg)   LMP  (LMP Unknown)   SpO2  97%   BMI 38.58 kg/m     Wt Readings from Last 3 Encounters:  08/19/22 239 lb (108.4 kg)  08/14/22 240 lb 6.4 oz (109 kg)  08/05/22 244 lb (110.7 kg)     GEN: Quite obese well nourished, well developed in no acute distress she has a resting tremor HEENT: Normal NECK: No JVD; No carotid bruits LYMPHATICS: No lymphadenopathy CARDIAC: RRR, no murmurs, rubs, gallops she has me to palpate her chest she has a bony outgrowth at the junction of the lower sternum and lower rib margin and is scheduled for an outpatient CT RESPIRATORY:  Clear to auscultation without rales, wheezing or rhonchi  ABDOMEN: Soft, non-tender, non-distended MUSCULOSKELETAL: She has very severe bilateral lower extremity edema into her thighs greater than 4+ she has venous ulcerations and she is oozing from the skin bilaterally clear liquid edema; No deformity  SKIN: Warm and dry NEUROLOGIC:  Alert and oriented x 3 PSYCHIATRIC:  Normal affect     Signed, Shirlee More, MD  08/19/2022 10:09 AM    [place]

## 2022-08-19 ENCOUNTER — Ambulatory Visit: Payer: 59 | Attending: Cardiology | Admitting: Cardiology

## 2022-08-19 ENCOUNTER — Encounter: Payer: Self-pay | Admitting: Cardiology

## 2022-08-19 VITALS — BP 120/74 | HR 78 | Ht 66.0 in | Wt 239.0 lb

## 2022-08-19 DIAGNOSIS — I119 Hypertensive heart disease without heart failure: Secondary | ICD-10-CM

## 2022-08-19 DIAGNOSIS — M7989 Other specified soft tissue disorders: Secondary | ICD-10-CM

## 2022-08-19 NOTE — Patient Instructions (Signed)
Medication Instructions:  Your physician recommends that you continue on your current medications as directed. Please refer to the Current Medication list given to you today.  *If you need a refill on your cardiac medications before your next appointment, please call your pharmacy*   Lab Work: None If you have labs (blood work) drawn today and your tests are completely normal, you will receive your results only by: MyChart Message (if you have MyChart) OR A paper copy in the mail If you have any lab test that is abnormal or we need to change your treatment, we will call you to review the results.   Testing/Procedures: Your physician has requested that you have an echocardiogram. Echocardiography is a painless test that uses sound waves to create images of your heart. It provides your doctor with information about the size and shape of your heart and how well your heart's chambers and valves are working. This procedure takes approximately one hour. There are no restrictions for this procedure. Please do NOT wear cologne, perfume, aftershave, or lotions (deodorant is allowed). Please arrive 15 minutes prior to your appointment time.    Follow-Up: At [facility], you and your health needs are our priority.  As part of our continuing mission to provide you with exceptional heart care, we have created designated Provider Care Teams.  These Care Teams include your primary Cardiologist (physician) and Advanced Practice Providers (APPs -  Physician Assistants and Nurse Practitioners) who all work together to provide you with the care you need, when you need it.  We recommend signing up for the patient portal called "MyChart".  Sign up information is provided on this After Visit Summary.  MyChart is used to connect with patients for Virtual Visits (Telemedicine).  Patients are able to view lab/test results, encounter notes, upcoming appointments, etc.  Non-urgent messages can be sent to your  provider as well.   To learn more about what you can do with MyChart, go to [email or web address].    Your next appointment:   3 month(s)  Provider:   Brian Munley, MD    Other Instructions None  

## 2022-08-21 ENCOUNTER — Other Ambulatory Visit: Payer: Self-pay

## 2022-08-21 DIAGNOSIS — R222 Localized swelling, mass and lump, trunk: Secondary | ICD-10-CM

## 2022-08-28 ENCOUNTER — Other Ambulatory Visit: Payer: Self-pay

## 2022-08-28 DIAGNOSIS — L97911 Non-pressure chronic ulcer of unspecified part of right lower leg limited to breakdown of skin: Secondary | ICD-10-CM

## 2022-08-28 DIAGNOSIS — R600 Localized edema: Secondary | ICD-10-CM

## 2022-08-31 ENCOUNTER — Other Ambulatory Visit: Payer: Self-pay | Admitting: Physician Assistant

## 2022-08-31 DIAGNOSIS — R600 Localized edema: Secondary | ICD-10-CM

## 2022-09-01 ENCOUNTER — Encounter: Payer: Self-pay | Admitting: Cardiology

## 2022-09-01 ENCOUNTER — Ambulatory Visit: Payer: 59 | Attending: Cardiology

## 2022-09-01 ENCOUNTER — Other Ambulatory Visit: Payer: Self-pay | Admitting: Family Medicine

## 2022-09-01 DIAGNOSIS — M7989 Other specified soft tissue disorders: Secondary | ICD-10-CM

## 2022-09-01 DIAGNOSIS — B354 Tinea corporis: Secondary | ICD-10-CM

## 2022-09-01 DIAGNOSIS — I119 Hypertensive heart disease without heart failure: Secondary | ICD-10-CM

## 2022-09-01 LAB — ECHOCARDIOGRAM COMPLETE
Area-P 1/2: 3.51 cm2
S' Lateral: 3.2 cm

## 2022-09-02 ENCOUNTER — Telehealth: Payer: Self-pay

## 2022-09-02 ENCOUNTER — Ambulatory Visit (INDEPENDENT_AMBULATORY_CARE_PROVIDER_SITE_OTHER): Payer: 59 | Admitting: Physician Assistant

## 2022-09-02 ENCOUNTER — Encounter: Payer: Self-pay | Admitting: Physician Assistant

## 2022-09-02 VITALS — BP 124/70 | HR 81 | Temp 97.2°F | Resp 14 | Ht 66.0 in | Wt 231.0 lb

## 2022-09-02 DIAGNOSIS — R600 Localized edema: Secondary | ICD-10-CM | POA: Diagnosis not present

## 2022-09-02 DIAGNOSIS — N3000 Acute cystitis without hematuria: Secondary | ICD-10-CM | POA: Diagnosis not present

## 2022-09-02 DIAGNOSIS — L97911 Non-pressure chronic ulcer of unspecified part of right lower leg limited to breakdown of skin: Secondary | ICD-10-CM

## 2022-09-02 DIAGNOSIS — L97921 Non-pressure chronic ulcer of unspecified part of left lower leg limited to breakdown of skin: Secondary | ICD-10-CM | POA: Diagnosis not present

## 2022-09-02 LAB — POCT URINALYSIS DIP (CLINITEK)
Bilirubin, UA: NEGATIVE
Blood, UA: NEGATIVE
Glucose, UA: NEGATIVE mg/dL
Ketones, POC UA: NEGATIVE mg/dL
Nitrite, UA: NEGATIVE
POC PROTEIN,UA: NEGATIVE
Spec Grav, UA: 1.015 (ref 1.010–1.025)
Urobilinogen, UA: 0.2 E.U./dL
pH, UA: 6 (ref 5.0–8.0)

## 2022-09-02 MED ORDER — DOXYCYCLINE HYCLATE 100 MG PO TABS
100.0000 mg | ORAL_TABLET | Freq: Two times a day (BID) | ORAL | 0 refills | Status: DC
Start: 2022-09-02 — End: 2022-09-25

## 2022-09-02 NOTE — Telephone Encounter (Signed)
Received the following message from Dr. Dulce Sellar below:  "Echo is normal"  Called patient and informed her of Dr. Hulen Shouts interpretation of her echo. Patient verbalized understanding and had no further questions at this time.

## 2022-09-02 NOTE — Progress Notes (Signed)
Acute Office Visit  Subjective:    Patient ID: Alicia Kelly, female    DOB: 02/17/1956, 67 y.o.   MRN: [id number]  Chief Complaint  Patient presents with   Leg ulcers    HPI: Patient is in today for leg ulcers  Pt in today after being seen at Urgent Care last week for complaints of lower leg pain and ulcerations.  Pt has been evaluated for this issue multiple times and has refused wound therapy, refuses unna boots, and not able to wear compression hose.  She has stasis dermatitis and also edema which causes ulcerations on lower legs.  She was given doxycycline to take which she has almost finished.  She finally agreed to go to wound therapy but deferred appt until next week because she wanted to 'rest her legs' this week She denies chest pain or shortness of breath.  Is currently following with cardiology and had echocardiogram yesterday  Pt states she has been having some mild dysuria as well  Past Medical History:  Diagnosis Date   Asthma    Calculus of gallbladder with chronic cholecystitis without obstruction 03/22/2017   Diabetes    Epilepsy    Hypertension    Sleep apnea     Past Surgical History:  Procedure Laterality Date   APPENDECTOMY     CHOLECYSTECTOMY     TONSILLECTOMY      Family History  Problem Relation Age of Onset   Colon cancer Mother        d. 24   Hypertension Father    Diabetes Father    Allergic rhinitis Sister    Hypertension Sister    Cancer - Colon Maternal Aunt    Cancer Maternal Uncle    Colon cancer Maternal Grandmother        dx 67s   Colon cancer Paternal Grandmother        dx 44s   Diabetes Paternal Grandmother    Breast cancer Neg Hx     Social History   Socioeconomic History   Marital status: Widowed    Spouse name: Not on file   Number of children: 0   Years of education: Not on file   Highest education level: 10th grade  Occupational History   Occupation: Disabled  Tobacco Use   Smoking status: Never    Smokeless tobacco: Never  Vaping Use   Vaping Use: Never used  Substance and Sexual Activity   Alcohol use: Never   Drug use: Never   Sexual activity: Not Currently  Other Topics Concern   Not on file  Social History Narrative   Spouse passed away 10 yrs ago -- no children   Social Determinants of Health   Financial Resource Strain: Low Risk  (07/07/2022)   Overall Financial Resource Strain (CARDIA)    Difficulty of Paying Living Expenses: Not hard at all  Food Insecurity: No Food Insecurity (07/07/2022)   Hunger Vital Sign    Worried About Running Out of Food in the Last Year: Never true    Ran Out of Food in the Last Year: Never true  Transportation Needs: No Transportation Needs (07/07/2022)   PRAPARE - Administrator, Civil Service (Medical): No    Lack of Transportation (Non-Medical): No  Physical Activity: Sufficiently Active (07/07/2022)   Exercise Vital Sign    Days of Exercise per Week: 5 days    Minutes of Exercise per Session: 30 min  Stress: No Stress Concern Present (07/07/2022)  [employer] of Occupational Health - Occupational Stress Questionnaire    Feeling of Stress : Not at all  Social Connections: Socially Isolated (07/07/2022)   Social Connection and Isolation Panel [NHANES]    Frequency of Communication with Friends and Family: More than three times a week    Frequency of Social Gatherings with Friends and Family: Once a week    Attends Religious Services: Never    Database administrator or Organizations: No    Attends Banker Meetings: Never    Marital Status: Widowed  Intimate Partner Violence: Not At Risk (07/07/2022)   Humiliation, Afraid, Rape, and Kick questionnaire    Fear of Current or Ex-Partner: No    Emotionally Abused: No    Physically Abused: No    Sexually Abused: No    Outpatient Medications Prior to Visit  Medication Sig Dispense Refill   Accu-Chek Softclix Lancets lancets TEST BLOOD SUGAR THREE TIMES DAILY  300 each 1   atorvastatin (LIPITOR) 40 MG tablet Take 1 tablet (40 mg total) by mouth daily. 90 tablet 0   Blood Glucose Calibration (ACCU-CHEK AVIVA) SOLN      Blood Glucose Monitoring Suppl (ACCU-CHEK AVIVA PLUS) w/Device KIT 1 each by Does not apply route 3 (three) times daily. 1 kit 0   clotrimazole-betamethasone (LOTRISONE) cream APPLY ONE APPLICATION TOPICALLY DAILY 30 g 2   cyclobenzaprine (FLEXERIL) 10 MG tablet Take 1 tablet (10 mg total) by mouth 3 (three) times daily as needed for muscle spasms. 30 tablet 0   fluticasone (FLONASE) 50 MCG/ACT nasal spray Place 2 sprays into both nostrils daily.     furosemide (LASIX) 20 MG tablet TAKE ONE TABLET BY MOUTH TWICE DAILY 30 tablet 3   glucose blood (ACCU-CHEK GUIDE) test strip Check sugars threes times daily 300 each 3   Insulin Pen Needle (B-D UF III MINI PEN NEEDLES) 31G X 5 MM MISC USE 1 PEN NEEDLE DAILY 100 each 2   lacosamide (VIMPAT) 200 MG TABS tablet Take 1.5 tablets (300 mg total) by mouth 2 (two) times daily. 180 tablet 2   levETIRAcetam (KEPPRA) 1000 MG tablet Take 1 tablet (1,000 mg total) by mouth in the morning, at noon, and at bedtime. 90 tablet 6   losartan-hydrochlorothiazide (HYZAAR) 100-25 MG tablet Take 1 tablet by mouth daily. 90 tablet 2   meclizine (ANTIVERT) 25 MG tablet Take 25 mg by mouth every 6 (six) hours as needed for dizziness.     montelukast (SINGULAIR) 10 MG tablet Take 1 tablet (10 mg total) by mouth daily. as directed 90 tablet 2   mupirocin ointment (BACTROBAN) 2 % Apply 1 Application topically 2 (two) times daily. 22 g 0   ondansetron (ZOFRAN-ODT) 4 MG disintegrating tablet      pantoprazole (PROTONIX) 40 MG tablet Take 1 tablet (40 mg total) by mouth 2 (two) times daily. 180 tablet 1   Semaglutide, 2 MG/DOSE, 8 MG/3ML SOPN Inject 2 mg as directed once a week. 3 mL 5   TRESIBA FLEXTOUCH 100 UNIT/ML FlexTouch Pen Inject 30 Units into the skin daily. 3 mL 2   Vitamin D, Ergocalciferol, (DRISDOL) 1.25 MG  (50000 UNIT) CAPS capsule Twice a week 8 capsule 3   doxycycline (VIBRAMYCIN) 100 MG capsule Take 100 mg by mouth 2 (two) times daily.     doxycycline (VIBRA-TABS) 100 MG tablet Take 1 tablet (100 mg total) by mouth 2 (two) times daily. 20 tablet 0   No facility-administered medications prior to visit.  Allergies  Allergen Reactions   Levofloxacin Other (See Comments)    Throat swelling   Lisinopril Hives   Nitrofurantoin Swelling    Throat swelling   Penicillins Anaphylaxis   Clarithromycin Other (See Comments)    seizures   Sulfamethoxazole-Trimethoprim     Causes seizures    Review of Systems CONSTITUTIONAL: Negative for chills, fatigue, fever,  CARDIOVASCULAR: Negative for chest pain, dizziness, palpitations - has moderate lower extremity edema with ulcerations RESPIRATORY: Negative for recent cough and dyspnea.  GASTROINTESTINAL: Negative for abdominal pain, acid reflux symptoms, constipation, diarrhea, nausea and vomiting.  GU - see HPI        Objective:   PHYSICAL EXAM:   VS: BP (!) 150/70   Pulse 81   Temp (!) 97.2 F (36.2 C)   Resp 14   Ht  (1.676 m)   Wt 231 lb (104.8 kg)   LMP  (LMP Unknown)   SpO2 97%   BMI 37.28 kg/m   GEN: Well nourished, well developed, in no acute distress  Cardiac: RRR; no murmurs, rubs, or gallops, 3+ edema with ulcerations and stasis dermatitis noted Respiratory:  normal respiratory rate and pattern with no distress - normal breath sounds with no rales, rhonchi, wheezes or rubs  Office Visit on 09/02/2022  Component Date Value Ref Range Status   Color, UA 09/02/2022 yellow  yellow Final   Clarity, UA 09/02/2022 clear  clear Final   Glucose, UA 09/02/2022 negative  negative mg/dL Final   Bilirubin, UA 98/03/9146 negative  negative Final   Ketones, POC UA 09/02/2022 negative  negative mg/dL Final   Spec Grav, UA 82/95/6213 1.015  1.010 - 1.025 Final   Blood, UA 09/02/2022 negative  negative Final   pH, UA 09/02/2022  6.0  5.0 - 8.0 Final   POC PROTEIN,UA 09/02/2022 negative  negative, trace Final   Urobilinogen, UA 09/02/2022 0.2  0.2 or 1.0 E.U./dL Final   Nitrite, UA 08/65/7846 Negative  Negative Final   Leukocytes, UA 09/02/2022 Trace (A)  Negative Final     Health Maintenance Due  Topic Date Due   OPHTHALMOLOGY EXAM  Never done   PAP SMEAR-Modifier  Never done   COVID-19 Vaccine (6 - 2023-24 season) 07/28/2022    There are no preventive care reminders to display for this patient.   Lab Results  Component Value Date   TSH 1.210 06/18/2022   Lab Results  Component Value Date   WBC 10.4 07/23/2022   HGB 11.2 07/23/2022   HCT 34.4 07/23/2022   MCV 89 07/23/2022   PLT 592 (H) 07/23/2022   Lab Results  Component Value Date   NA 138 07/23/2022   K 3.8 07/23/2022   CO2 23 07/23/2022   GLUCOSE 178 (H) 07/23/2022   BUN 25 07/23/2022   CREATININE 0.86 07/23/2022   BILITOT 0.2 07/23/2022   ALKPHOS 93 07/23/2022   AST 14 07/23/2022   ALT 16 07/23/2022   PROT 6.8 07/23/2022   ALBUMIN 4.1 07/23/2022   CALCIUM 10.0 07/23/2022   EGFR 75 07/23/2022   Lab Results  Component Value Date   CHOL 118 06/18/2022   Lab Results  Component Value Date   HDL 40 06/18/2022   Lab Results  Component Value Date   LDLCALC 49 06/18/2022   Lab Results  Component Value Date   TRIG 172 (H) 06/18/2022   Lab Results  Component Value Date   CHOLHDL 3.0 06/18/2022   Lab Results  Component Value Date  HGBA1C 8.4 (H) 06/18/2022       Assessment & Plan:  Acute infective cystitis -     POCT URINALYSIS DIP (CLINITEK) -     Urine Culture -     Doxycycline Hyclate; Take 1 tablet (100 mg total) by mouth 2 (two) times daily.  Dispense: 20 tablet; Refill: 0  Bilateral leg edema  Ulcers of both lower extremities, limited to breakdown of skin Pt refuses unna boots Recommend to keep legs elevated -- follow up with wound therapy next week    Meds ordered this encounter  Medications    doxycycline (VIBRA-TABS) 100 MG tablet    Sig: Take 1 tablet (100 mg total) by mouth 2 (two) times daily.    Dispense:  20 tablet    Refill:  0    Order Specific Question:   Supervising Provider    AnswerCorey Harold    Orders Placed This Encounter  Procedures   Urine Culture   POCT URINALYSIS DIP (CLINITEK)     Follow-up: Return for as scheduled for next chronic visit.  An After Visit Summary was printed and given to the patient.  Jettie Pagan [facility] [phone]

## 2022-09-03 LAB — URINE CULTURE

## 2022-09-15 ENCOUNTER — Telehealth: Payer: Self-pay

## 2022-09-15 NOTE — Telephone Encounter (Signed)
Patient called and stated that she went to see orthopedic and they stated they needed to do surgery on her shoulder but she said that they would not do surgery till A- she was cleared from her doctors, and B- if she was on antibiotics to get rid of infection on her legs, which she is being treated by wound clinic for and patient was informed that when she goes to see them tomorrow to follow up at the wound center she would need to discuss this with them because we are currently not treating her for her legs we referred her to them which she has been seeing them for her legs. Patient understood verbally and said she would let them know.

## 2022-09-18 ENCOUNTER — Encounter: Payer: Self-pay | Admitting: Oncology

## 2022-09-23 ENCOUNTER — Telehealth: Payer: Self-pay

## 2022-09-23 NOTE — Telephone Encounter (Signed)
Patient came in today with sister Misty [place]) and Dickie La (husband) stated patient has had two seizure last week and stated she had one last night and hit her head has 7 staples and a small hematoma on her head, went to see neurologist this morning and Dr stated patient may need to talk to her PCP about her having a hospital bed with rails ordered and sister wanted to know what's going on with the edema in her legs. Patient stated she stop going to wound clinic because she felt that they was not helping her legs, they were making them worst.   Provider came into talk to patient and family.  Per provider patient has an appointment for 10/26/22 at 3:40 PM, to discuss information.

## 2022-09-24 NOTE — Progress Notes (Signed)
[facility]  [address] [place],  [place]  [address] [phone]  Clinic Day:  09/25/2022  Referring physician: No ref. provider found  HISTORY OF PRESENT ILLNESS:  The patient is a 67 y.o. female  with leukocytosis that was due to previous wound infections.  Furthermore, past labs showed evidence of iron deficiency anemia for which she has received IV iron in the past to replenish her iron stores and normalize her hemoglobin.  She comes in today to reassess her peripheral counts.  Overall, the patient claims to be doing okay.  She has chronic problems with lymphedema in her legs, which are bandaged.  She has had prior problems with cellulitis for which she was placed on antibiotics.  As her legs remain problematic, she wishes to be referred to the [facility].  As it pertains to her leukocytosis, she denies having any acute infections.  As it pertains to her iron deficiency anemia, she denies having any overt forms of blood loss.  PHYSICAL EXAM:  Blood pressure 123/63, pulse 76, temperature 98.3 F (36.8 C), temperature source Oral, resp. rate 16, height 5\' 6"  (1.676 m), weight 232 lb 9.6 oz (105.5 kg), SpO2 100 %. Wt Readings from Last 3 Encounters:  09/25/22 232 lb 9.6 oz (105.5 kg)  09/02/22 231 lb (104.8 kg)  08/19/22 239 lb (108.4 kg)   Body mass index is 37.54 kg/m. Performance status (ECOG): 2 - Symptomatic, <50% confined to bed Physical Exam Constitutional:      Appearance: Normal appearance. She is not ill-appearing.  HENT:     Mouth/Throat:     Mouth: Mucous membranes are moist.     Pharynx: Oropharynx is clear. No oropharyngeal exudate or posterior oropharyngeal erythema.  Cardiovascular:     Rate and Rhythm: Normal rate and regular rhythm.     Heart sounds: No murmur heard.    No friction rub. No gallop.  Pulmonary:     Effort: Pulmonary effort is normal. No respiratory distress.     Breath sounds: Normal breath  sounds. No wheezing, rhonchi or rales.  Abdominal:     General: Bowel sounds are normal. There is no distension.     Palpations: Abdomen is soft. There is no mass.     Tenderness: There is no abdominal tenderness.  Musculoskeletal:        General: No swelling.     Right lower leg: No edema.     Left lower leg: No edema.  Lymphadenopathy:     Cervical: No cervical adenopathy.     Upper Body:     Right upper body: No supraclavicular or axillary adenopathy.     Left upper body: No supraclavicular or axillary adenopathy.     Lower Body: No right inguinal adenopathy. No left inguinal adenopathy.  Skin:    General: Skin is warm.     Coloration: Skin is not jaundiced.     Findings: No lesion or rash.  Neurological:     General: No focal deficit present.     Mental Status: She is alert and oriented to person, place, and time. Mental status is at baseline.  Psychiatric:        Mood and Affect: Mood normal.        Behavior: Behavior normal.        Thought Content: Thought content normal.    LABS:      Latest Reference Range & Units 09/25/22 08:32  Iron 28 - 170 ug/dL 48  UIBC ug/dL 161  TIBC 096 - 045 ug/dL 409  Saturation Ratios 10.4 - 31.8 % 13  Ferritin 11 - 307 ng/mL 13  Folate >5.9 ng/mL 4.6 (L)  Vitamin B12 180 - 914 pg/mL 171 (L)  (L): Data is abnormally low  ASSESSMENT & PLAN:  A 67 y.o. female who I follow for leukocytosis and iron deficiency anemia.  When evaluating her labs, the patient's hemoglobin has fallen since her last visit.  It clearly appears that she is deficient in multiple vitamins, including B12, iron, and folic acid.  Based upon this, I will arrange for her to receive IV iron over these next few weeks to replenish her iron stores.  She will also start a protracted course of B12 injections, which include 1000 mcg daily x 7 days, then weekly x 4 weeks, then monthly indefinitely.  I will also take tell her to take folate 1000 mcg daily for at least the next 4  months.  With respect to her lymphedema, I am concerned that her left leg may also be gradually developing cellulitis.  After speaking with her primary care office, the decision was made to start her on doxycycline 100 mg twice daily x 10 days.  I will also refer her to the wound care center in [facility] to where her legs can be better assessed.  Otherwise, I will see this patient back in 3 months to see how well she has responded to all of the aforementioned interventions.  The patient understands all the plans discussed today and is in agreement with them.   Binnie Vonderhaar Kirby Funk, MD

## 2022-09-25 ENCOUNTER — Inpatient Hospital Stay: Payer: 59

## 2022-09-25 ENCOUNTER — Inpatient Hospital Stay: Payer: 59 | Attending: Oncology | Admitting: Oncology

## 2022-09-25 ENCOUNTER — Telehealth: Payer: Self-pay

## 2022-09-25 DIAGNOSIS — D72829 Elevated white blood cell count, unspecified: Secondary | ICD-10-CM

## 2022-09-25 DIAGNOSIS — E538 Deficiency of other specified B group vitamins: Secondary | ICD-10-CM | POA: Insufficient documentation

## 2022-09-25 DIAGNOSIS — N3000 Acute cystitis without hematuria: Secondary | ICD-10-CM | POA: Diagnosis not present

## 2022-09-25 DIAGNOSIS — D509 Iron deficiency anemia, unspecified: Secondary | ICD-10-CM

## 2022-09-25 LAB — CMP (CANCER CENTER ONLY)
ALT: 21 U/L (ref 0–44)
AST: 17 U/L (ref 15–41)
Albumin: 3.1 g/dL — ABNORMAL LOW (ref 3.5–5.0)
Alkaline Phosphatase: 66 U/L (ref 38–126)
Anion gap: 11 (ref 5–15)
BUN: 25 mg/dL — ABNORMAL HIGH (ref 8–23)
CO2: 27 mmol/L (ref 22–32)
Calcium: 9.1 mg/dL (ref 8.9–10.3)
Chloride: 100 mmol/L (ref 98–111)
Creatinine: 0.97 mg/dL (ref 0.44–1.00)
GFR, Estimated: >60 mL/min (ref >60)
Glucose, Bld: 213 mg/dL — ABNORMAL HIGH (ref 70–99)
Potassium: 3 mmol/L — ABNORMAL LOW (ref 3.5–5.1)
Sodium: 138 mmol/L (ref 135–145)
Total Bilirubin: 0.4 mg/dL (ref 0.3–1.2)
Total Protein: 6.6 g/dL (ref 6.5–8.1)

## 2022-09-25 LAB — CBC AND DIFFERENTIAL
HCT: 30 — AB (ref 36–46)
Hemoglobin: 9.9 — AB (ref 12.0–16.0)
Neutrophils Absolute: 8.4
Platelets: 609 10*3/uL — AB (ref 150–400)
WBC: 11.2

## 2022-09-25 LAB — IRON AND TIBC
Iron: 48 ug/dL (ref 28–170)
Saturation Ratios: 13 % (ref 10.4–31.8)
TIBC: 381 ug/dL (ref 250–450)
UIBC: 333 ug/dL

## 2022-09-25 LAB — CBC: RBC: 3.45 — AB (ref 3.87–5.11)

## 2022-09-25 LAB — VITAMIN B12: Vitamin B-12: 171 pg/mL — ABNORMAL LOW (ref 180–914)

## 2022-09-25 LAB — FERRITIN: Ferritin: 13 ng/mL (ref 11–307)

## 2022-09-25 LAB — FOLATE: Folate: 4.6 ng/mL — ABNORMAL LOW (ref >5.9)

## 2022-09-25 MED ORDER — DOXYCYCLINE HYCLATE 100 MG PO TABS
100.0000 mg | ORAL_TABLET | Freq: Two times a day (BID) | ORAL | 0 refills | Status: AC
Start: 2022-09-25 — End: 2022-10-02

## 2022-09-25 NOTE — Telephone Encounter (Signed)
Dr Melvyn Neth reviewed the labs and recommends that pt have iron infusions and B12 injections. Pt agreed to both. I told her once insurance authorization is obtained, the schedulers will call her to set up appt's. She verbalized understanding.  Latest Reference Range & Units 09/25/22 08:32  Iron 28 - 170 ug/dL 48  UIBC ug/dL 161  TIBC 096 - 045 ug/dL 409  Saturation Ratios 10.4 - 31.8 % 13  Ferritin 11 - 307 ng/mL 13  Folate >5.9 ng/mL 4.6 (L)  Vitamin B12 180 - 914 pg/mL 171 (L)  (L): Data is abnormally low

## 2022-09-28 ENCOUNTER — Telehealth: Payer: Self-pay

## 2022-09-28 NOTE — Telephone Encounter (Signed)
I notified pt that Dr Melvyn Neth would like her to start taking folic acid 1mg  po qd. Pt wrote the information down and it read it back to me correctly. She will go to [facility] in the next couple days and pick it up. Pt also inquired if Dr Melvyn Neth had sent the referral to [facility] center. I told pt I would check into it.

## 2022-09-29 ENCOUNTER — Encounter: Payer: Self-pay | Admitting: Oncology

## 2022-09-29 ENCOUNTER — Ambulatory Visit: Payer: 59 | Admitting: Physician Assistant

## 2022-09-29 MED FILL — Ferumoxytol Inj 510 MG/17ML (30 MG/ML) (Elemental Fe): INTRAVENOUS | Qty: 17 | Status: AC

## 2022-09-29 NOTE — Addendum Note (Signed)
Addended by: Domenic Schwab on: 09/29/2022 02:01 PM   Modules accepted: Orders

## 2022-09-30 ENCOUNTER — Encounter: Payer: Self-pay | Admitting: Oncology

## 2022-09-30 ENCOUNTER — Inpatient Hospital Stay: Payer: 59

## 2022-09-30 VITALS — BP 138/70 | HR 79 | Temp 98.4°F | Resp 18 | Ht 66.0 in | Wt 231.2 lb

## 2022-09-30 DIAGNOSIS — D509 Iron deficiency anemia, unspecified: Secondary | ICD-10-CM | POA: Diagnosis not present

## 2022-09-30 MED ORDER — SODIUM CHLORIDE 0.9 % IV SOLN: Freq: Once | INTRAVENOUS | Status: DC | PRN

## 2022-09-30 MED ORDER — CYANOCOBALAMIN 1000 MCG/ML IJ SOLN
1000.0000 ug | Freq: Once | INTRAMUSCULAR | Status: AC
  Administered 2022-09-30: 1000 ug via INTRAMUSCULAR
  Filled 2022-09-30: qty 1

## 2022-09-30 MED ORDER — SODIUM CHLORIDE 0.9 % IV SOLN
510.0000 mg | Freq: Once | INTRAVENOUS | Status: AC
  Administered 2022-09-30: 510 mg via INTRAVENOUS
  Filled 2022-09-30: qty 510

## 2022-09-30 MED ORDER — FAMOTIDINE IN NACL 20-0.9 MG/50ML-% IV SOLN
20.0000 mg | Freq: Once | INTRAVENOUS | Status: AC | PRN
  Administered 2022-09-30: 20 mg via INTRAVENOUS

## 2022-09-30 MED ORDER — SODIUM CHLORIDE 0.9 % IV SOLN: Freq: Once | INTRAVENOUS | Status: AC

## 2022-09-30 NOTE — Patient Instructions (Signed)

## 2022-09-30 NOTE — Progress Notes (Signed)
Hypersensitivity Reaction note  Date of event: 09/30/22 Time of event: 1050 Generic name of drug involved: Feraheme Name of provider notified of the hypersensitivity reaction: 1050 Was agent that likely caused hypersensitivity reaction added to Allergies List within EMR? yes Chain of events including reaction signs/symptoms, treatment administered, and outcome (e.g., drug resumed; drug discontinued; sent to Emergency Department; etc.)  Patient started to get flush, hot feeling. SHOB. Stopped feraheme, called Darl Pikes, pharm over to assess patient. Started Pepcid IV. Patients bp dropped a little, (see Vitals) Darl Pikes, pharm ok to give 250cc of fluid since patient is already on a fluid pill. Patient starting to feel better at 1100. Did not resume feraheme and continued NS fluids. Patient has bf driving her and I reminded her of signs and symptoms to watch for when she is at home and to call 911 if symptoms worsen.   Sherlyn Lick, RN 09/30/2022 11:22 AM

## 2022-09-30 NOTE — Addendum Note (Signed)
Addended by: Domenic Schwab on: 09/30/2022 11:39 AM   Modules accepted: Orders

## 2022-10-01 ENCOUNTER — Inpatient Hospital Stay: Payer: 59

## 2022-10-01 VITALS — BP 116/63 | HR 80 | Temp 98.4°F | Resp 18 | Ht 66.0 in | Wt 231.0 lb

## 2022-10-01 DIAGNOSIS — D509 Iron deficiency anemia, unspecified: Secondary | ICD-10-CM

## 2022-10-01 MED ORDER — CYANOCOBALAMIN 1000 MCG/ML IJ SOLN
1000.0000 ug | Freq: Once | INTRAMUSCULAR | Status: AC
  Administered 2022-10-01: 1000 ug via INTRAMUSCULAR
  Filled 2022-10-01: qty 1

## 2022-10-01 MED FILL — Iron Sucrose Inj 20 MG/ML (Fe Equiv): INTRAVENOUS | Qty: 10 | Status: AC

## 2022-10-01 NOTE — Patient Instructions (Signed)

## 2022-10-02 ENCOUNTER — Encounter: Payer: Self-pay | Admitting: Oncology

## 2022-10-02 ENCOUNTER — Inpatient Hospital Stay: Payer: 59

## 2022-10-02 VITALS — BP 109/59 | HR 81 | Temp 97.8°F | Resp 18 | Ht 66.0 in | Wt 231.0 lb

## 2022-10-02 DIAGNOSIS — D509 Iron deficiency anemia, unspecified: Secondary | ICD-10-CM | POA: Diagnosis not present

## 2022-10-02 MED ORDER — SODIUM CHLORIDE 0.9 % IV SOLN
200.0000 mg | Freq: Once | INTRAVENOUS | Status: AC
  Administered 2022-10-02: 200 mg via INTRAVENOUS
  Filled 2022-10-02: qty 200

## 2022-10-02 MED ORDER — SODIUM CHLORIDE 0.9 % IV SOLN: Freq: Once | INTRAVENOUS | Status: AC

## 2022-10-02 MED ORDER — CYANOCOBALAMIN 1000 MCG/ML IJ SOLN
1000.0000 ug | Freq: Once | INTRAMUSCULAR | Status: AC
  Administered 2022-10-02: 1000 ug via INTRAMUSCULAR
  Filled 2022-10-02: qty 1

## 2022-10-02 MED FILL — Iron Sucrose Inj 20 MG/ML (Fe Equiv): INTRAVENOUS | Qty: 10 | Status: AC

## 2022-10-02 NOTE — Addendum Note (Signed)
Addended by: Domenic Schwab on: 10/02/2022 01:40 PM   Modules accepted: Orders

## 2022-10-02 NOTE — Patient Instructions (Signed)

## 2022-10-04 ENCOUNTER — Other Ambulatory Visit: Payer: Self-pay | Admitting: Physician Assistant

## 2022-10-04 DIAGNOSIS — R600 Localized edema: Secondary | ICD-10-CM

## 2022-10-05 ENCOUNTER — Inpatient Hospital Stay: Payer: 59

## 2022-10-05 VITALS — BP 104/56 | HR 72 | Temp 98.1°F | Resp 18 | Ht 66.0 in | Wt 232.8 lb

## 2022-10-05 DIAGNOSIS — D509 Iron deficiency anemia, unspecified: Secondary | ICD-10-CM | POA: Diagnosis not present

## 2022-10-05 MED ORDER — SODIUM CHLORIDE 0.9 % IV SOLN
200.0000 mg | Freq: Once | INTRAVENOUS | Status: AC
  Administered 2022-10-05: 200 mg via INTRAVENOUS
  Filled 2022-10-05: qty 200

## 2022-10-05 MED ORDER — SODIUM CHLORIDE 0.9 % IV SOLN: Freq: Once | INTRAVENOUS | Status: AC

## 2022-10-05 MED ORDER — CYANOCOBALAMIN 1000 MCG/ML IJ SOLN
1000.0000 ug | Freq: Once | INTRAMUSCULAR | Status: AC
  Administered 2022-10-05: 1000 ug via INTRAMUSCULAR
  Filled 2022-10-05: qty 1

## 2022-10-05 NOTE — Patient Instructions (Signed)

## 2022-10-06 ENCOUNTER — Inpatient Hospital Stay: Payer: 59

## 2022-10-06 VITALS — BP 130/68 | HR 89 | Temp 98.1°F | Resp 16 | Ht 66.0 in | Wt 231.1 lb

## 2022-10-06 DIAGNOSIS — D509 Iron deficiency anemia, unspecified: Secondary | ICD-10-CM

## 2022-10-06 MED ORDER — CYANOCOBALAMIN 1000 MCG/ML IJ SOLN
1000.0000 ug | Freq: Once | INTRAMUSCULAR | Status: AC
  Administered 2022-10-06: 1000 ug via INTRAMUSCULAR
  Filled 2022-10-06: qty 1

## 2022-10-06 MED FILL — Iron Sucrose Inj 20 MG/ML (Fe Equiv): INTRAVENOUS | Qty: 10 | Status: AC

## 2022-10-07 ENCOUNTER — Encounter: Payer: Self-pay | Admitting: Oncology

## 2022-10-07 ENCOUNTER — Inpatient Hospital Stay: Payer: 59

## 2022-10-07 VITALS — BP 125/60 | HR 85 | Temp 97.8°F | Resp 18 | Ht 66.0 in | Wt 231.1 lb

## 2022-10-07 DIAGNOSIS — D509 Iron deficiency anemia, unspecified: Secondary | ICD-10-CM | POA: Diagnosis not present

## 2022-10-07 MED ORDER — CYANOCOBALAMIN 1000 MCG/ML IJ SOLN
1000.0000 ug | Freq: Once | INTRAMUSCULAR | Status: AC
  Administered 2022-10-07: 1000 ug via INTRAMUSCULAR
  Filled 2022-10-07: qty 1

## 2022-10-07 MED FILL — Iron Sucrose Inj 20 MG/ML (Fe Equiv): INTRAVENOUS | Qty: 10 | Status: AC

## 2022-10-07 NOTE — Addendum Note (Signed)
Addended by: Domenic Schwab on: 10/07/2022 12:10 PM   Modules accepted: Orders

## 2022-10-08 ENCOUNTER — Inpatient Hospital Stay: Payer: 59

## 2022-10-08 VITALS — BP 120/60 | HR 84 | Temp 98.1°F | Resp 18 | Ht 66.0 in | Wt 233.0 lb

## 2022-10-08 DIAGNOSIS — D509 Iron deficiency anemia, unspecified: Secondary | ICD-10-CM | POA: Diagnosis not present

## 2022-10-08 MED ORDER — CYANOCOBALAMIN 1000 MCG/ML IJ SOLN
1000.0000 ug | Freq: Once | INTRAMUSCULAR | Status: AC
  Administered 2022-10-08: 1000 ug via INTRAMUSCULAR
  Filled 2022-10-08: qty 1

## 2022-10-08 MED ORDER — SODIUM CHLORIDE 0.9 % IV SOLN
200.0000 mg | Freq: Once | INTRAVENOUS | Status: AC
  Administered 2022-10-08: 200 mg via INTRAVENOUS
  Filled 2022-10-08: qty 200

## 2022-10-08 MED ORDER — SODIUM CHLORIDE 0.9 % IV SOLN: Freq: Once | INTRAVENOUS | Status: AC

## 2022-10-08 NOTE — Patient Instructions (Signed)
Iron Sucrose Injection What is this medication? IRON SUCROSE (EYE ern SOO krose) treats low levels of iron (iron deficiency anemia) in people with kidney disease. Iron is a mineral that plays an important role in making red blood cells, which carry oxygen from your lungs to the rest of your body. This medicine may be used for other purposes; ask your health care provider or pharmacist if you have questions. COMMON BRAND NAME(S): Venofer What should I tell my care team before I take this medication? They need to know if you have any of these conditions: Anemia not caused by low iron levels Heart disease High levels of iron in the blood Kidney disease Liver disease An unusual or allergic reaction to iron, other medications, foods, dyes, or preservatives Pregnant or trying to get pregnant Breastfeeding How should I use this medication? This medication is for infusion into a vein. It is given in a hospital or clinic setting. Talk to your care team about the use of this medication in children. While this medication may be prescribed for children as young as 2 years for selected conditions, precautions do apply. Overdosage: If you think you have taken too much of this medicine contact a poison control center or emergency room at once. NOTE: This medicine is only for you. Do not share this medicine with others. What if I miss a dose? Keep appointments for follow-up doses. It is important not to miss your dose. Call your care team if you are unable to keep an appointment. What may interact with this medication? Do not take this medication with any of the following: Deferoxamine Dimercaprol Other iron products This medication may also interact with the following: Chloramphenicol Deferasirox This list may not describe all possible interactions. Give your health care provider a list of all the medicines, herbs, non-prescription drugs, or dietary supplements you use. Also tell them if you smoke,  drink alcohol, or use illegal drugs. Some items may interact with your medicine. What should I watch for while using this medication? Visit your care team regularly. Tell your care team if your symptoms do not start to get better or if they get worse. You may need blood work done while you are taking this medication. You may need to follow a special diet. Talk to your care team. Foods that contain iron include: whole grains/cereals, dried fruits, beans, or peas, leafy green vegetables, and organ meats (liver, kidney). What side effects may I notice from receiving this medication? Side effects that you should report to your care team as soon as possible: Allergic reactions--skin rash, itching, hives, swelling of the face, lips, tongue, or throat Low blood pressure--dizziness, feeling faint or lightheaded, blurry vision Shortness of breath Side effects that usually do not require medical attention (report to your care team if they continue or are bothersome): Flushing Headache Joint pain Muscle pain Nausea Pain, redness, or irritation at injection site This list may not describe all possible side effects. Call your doctor for medical advice about side effects. You may report side effects to FDA at 1-800-FDA-1088. Where should I keep my medication? This medication is given in a hospital or clinic and will not be stored at home. NOTE: This sheet is a summary. It may not cover all possible information. If you have questions about this medicine, talk to your doctor, pharmacist, or health care provider.  2023 Elsevier/Gold Standard (2020-08-15 00:00:00) Vitamin B12 Deficiency Vitamin B12 deficiency occurs when the body does not have enough of this important vitamin.  The body needs this vitamin: To make red blood cells. To make DNA. This is the genetic material inside cells. To help the nerves work properly so they can carry messages from the brain to the body. Vitamin B12 deficiency can cause  health problems, such as not having enough red blood cells in the blood (anemia). This can lead to nerve damage if untreated. What are the causes? This condition may be caused by: Not eating enough foods that contain vitamin B12. Not having enough stomach acid and digestive fluids to properly absorb vitamin B12 from the food that you eat. Having certain diseases that make it hard to absorb vitamin B12. These diseases include Crohn's disease, chronic pancreatitis, and cystic fibrosis. An autoimmune disorder in which the body does not make enough of a protein (intrinsic factor) within the stomach, resulting in not enough absorption of vitamin B12. Having a surgery in which part of the stomach or small intestine is removed. Taking certain medicines that make it hard for the body to absorb vitamin B12. These include: Heartburn medicines, such as antacids and proton pump inhibitors. Some medicines that are used to treat diabetes. What increases the risk? The following factors may make you more likely to develop a vitamin B12 deficiency: Being an older adult. Eating a vegetarian or vegan diet that does not include any foods that come from animals. Eating a poor diet while you are pregnant. Taking certain medicines. Having alcoholism. What are the signs or symptoms? In some cases, there are no symptoms of this condition. If the condition leads to anemia or nerve damage, various symptoms may occur, such as: Weakness. Tiredness (fatigue). Loss of appetite. Numbness or tingling in your hands and feet. Redness and burning of the tongue. Depression, confusion, or memory problems. Trouble walking. If anemia is severe, symptoms can include: Shortness of breath. Dizziness. Rapid heart rate. How is this diagnosed? This condition may be diagnosed with a blood test to measure the level of vitamin B12 in your blood. You may also have other tests, including: A group of tests that measure certain  characteristics of blood cells (complete blood count, CBC). A blood test to measure intrinsic factor. A procedure where a thin tube with a camera on the end is used to look into your stomach or intestines (endoscopy). Other tests may be needed to discover the cause of the deficiency. How is this treated? Treatment for this condition depends on the cause. This condition may be treated by: Changing your eating and drinking habits, such as: Eating more foods that contain vitamin B12. Drinking less alcohol or no alcohol. Getting vitamin B12 injections. Taking vitamin B12 supplements by mouth (orally). Your health care provider will tell you which dose is best for you. Follow these instructions at home: Eating and drinking  Include foods in your diet that come from animals and contain a lot of vitamin B12. These include: Meats and poultry. This includes beef, pork, chicken, [place], and organ meats, such as liver. Seafood. This includes clams, rainbow trout, salmon, tuna, and haddock. Eggs. Dairy foods such as milk, yogurt, and cheese. Eat foods that have vitamin B12 added to them (are fortified), such as ready-to-eat breakfast cereals. Check the label on the package to see if a food is fortified. The items listed above may not be a complete list of foods and beverages you can eat and drink. Contact a dietitian for more information. Alcohol use Do not drink alcohol if: Your health care provider tells you not to  drink. You are pregnant, may be pregnant, or are planning to become pregnant. If you drink alcohol: Limit how much you have to: 0-1 drink a day for women. 0-2 drinks a day for men. Know how much alcohol is in your drink. In the [place] one drink equals one 12 oz bottle of beer (355 mL), one 5 oz glass of wine (148 mL), or one 1 oz glass of hard liquor (44 mL). General instructions Get vitamin B12 injections if told to by your health care provider. Take supplements only as told by your  health care provider. Follow the directions carefully. Keep all follow-up visits. This is important. Contact a health care provider if: Your symptoms come back. Your symptoms get worse or do not improve with treatment. Get help right away: You develop shortness of breath. You have a rapid heart rate. You have chest pain. You become dizzy or you faint. These symptoms may be an emergency. Get help right away. Call 911. Do not wait to see if the symptoms will go away. Do not drive yourself to the hospital. Summary Vitamin B12 deficiency occurs when the body does not have enough of this important vitamin. Common causes include not eating enough foods that contain vitamin B12, not being able to absorb vitamin B12 from the food that you eat, having a surgery in which part of the stomach or small intestine is removed, or taking certain medicines. Eat foods that have vitamin B12 in them. Treatment may include making a change in the way you eat and drink, getting vitamin B12 injections, or taking vitamin B12 supplements. This information is not intended to replace advice given to you by your health care provider. Make sure you discuss any questions you have with your health care provider. Document Revised: 12/27/2020 Document Reviewed: 12/27/2020 Elsevier Patient Education  2023 [employer].

## 2022-10-13 ENCOUNTER — Other Ambulatory Visit: Payer: Self-pay | Admitting: Family Medicine

## 2022-10-13 DIAGNOSIS — E1169 Type 2 diabetes mellitus with other specified complication: Secondary | ICD-10-CM

## 2022-10-14 ENCOUNTER — Other Ambulatory Visit: Payer: Self-pay | Admitting: Physician Assistant

## 2022-10-14 DIAGNOSIS — R600 Localized edema: Secondary | ICD-10-CM

## 2022-10-15 ENCOUNTER — Inpatient Hospital Stay: Payer: 59

## 2022-10-15 VITALS — BP 139/68 | HR 68 | Temp 97.5°F | Resp 18 | Ht 66.0 in | Wt 229.2 lb

## 2022-10-15 DIAGNOSIS — D509 Iron deficiency anemia, unspecified: Secondary | ICD-10-CM | POA: Diagnosis not present

## 2022-10-15 MED ORDER — CYANOCOBALAMIN 1000 MCG/ML IJ SOLN
1000.0000 ug | Freq: Once | INTRAMUSCULAR | Status: AC
  Administered 2022-10-15: 1000 ug via INTRAMUSCULAR
  Filled 2022-10-15: qty 1

## 2022-10-15 NOTE — Patient Instructions (Signed)

## 2022-10-19 ENCOUNTER — Ambulatory Visit: Payer: 59 | Admitting: Physician Assistant

## 2022-10-21 ENCOUNTER — Other Ambulatory Visit: Payer: Self-pay | Admitting: Physician Assistant

## 2022-10-21 DIAGNOSIS — R600 Localized edema: Secondary | ICD-10-CM

## 2022-10-22 ENCOUNTER — Inpatient Hospital Stay: Payer: 59 | Attending: Oncology

## 2022-10-22 VITALS — BP 131/61 | HR 79 | Temp 97.7°F | Resp 18 | Ht 66.0 in | Wt 228.5 lb

## 2022-10-22 DIAGNOSIS — D509 Iron deficiency anemia, unspecified: Secondary | ICD-10-CM | POA: Diagnosis present

## 2022-10-22 DIAGNOSIS — E538 Deficiency of other specified B group vitamins: Secondary | ICD-10-CM | POA: Insufficient documentation

## 2022-10-22 MED ORDER — CYANOCOBALAMIN 1000 MCG/ML IJ SOLN
1000.0000 ug | Freq: Once | INTRAMUSCULAR | Status: AC
  Administered 2022-10-22: 1000 ug via INTRAMUSCULAR
  Filled 2022-10-22: qty 1

## 2022-10-22 NOTE — Patient Instructions (Signed)

## 2022-10-26 ENCOUNTER — Other Ambulatory Visit: Payer: Self-pay | Admitting: Physician Assistant

## 2022-10-26 ENCOUNTER — Ambulatory Visit: Payer: 59 | Admitting: Physician Assistant

## 2022-10-26 DIAGNOSIS — R600 Localized edema: Secondary | ICD-10-CM

## 2022-10-28 ENCOUNTER — Ambulatory Visit (INDEPENDENT_AMBULATORY_CARE_PROVIDER_SITE_OTHER): Payer: 59 | Admitting: Physician Assistant

## 2022-10-28 ENCOUNTER — Encounter: Payer: Self-pay | Admitting: Physician Assistant

## 2022-10-28 VITALS — BP 102/64 | HR 85 | Temp 97.1°F | Ht 66.0 in | Wt 227.2 lb

## 2022-10-28 DIAGNOSIS — G40309 Generalized idiopathic epilepsy and epileptic syndromes, not intractable, without status epilepticus: Secondary | ICD-10-CM

## 2022-10-28 DIAGNOSIS — E559 Vitamin D deficiency, unspecified: Secondary | ICD-10-CM | POA: Diagnosis not present

## 2022-10-28 DIAGNOSIS — K2100 Gastro-esophageal reflux disease with esophagitis, without bleeding: Secondary | ICD-10-CM

## 2022-10-28 DIAGNOSIS — I10 Essential (primary) hypertension: Secondary | ICD-10-CM

## 2022-10-28 DIAGNOSIS — E782 Mixed hyperlipidemia: Secondary | ICD-10-CM

## 2022-10-28 DIAGNOSIS — Z794 Long term (current) use of insulin: Secondary | ICD-10-CM

## 2022-10-28 DIAGNOSIS — L97921 Non-pressure chronic ulcer of unspecified part of left lower leg limited to breakdown of skin: Secondary | ICD-10-CM

## 2022-10-28 DIAGNOSIS — L97911 Non-pressure chronic ulcer of unspecified part of right lower leg limited to breakdown of skin: Secondary | ICD-10-CM

## 2022-10-28 DIAGNOSIS — N3000 Acute cystitis without hematuria: Secondary | ICD-10-CM

## 2022-10-28 DIAGNOSIS — R296 Repeated falls: Secondary | ICD-10-CM

## 2022-10-28 DIAGNOSIS — E119 Type 2 diabetes mellitus without complications: Secondary | ICD-10-CM

## 2022-10-28 DIAGNOSIS — R600 Localized edema: Secondary | ICD-10-CM

## 2022-10-28 LAB — POCT URINALYSIS DIP (CLINITEK)
Bilirubin, UA: NEGATIVE
Blood, UA: NEGATIVE
Glucose, UA: NEGATIVE mg/dL
Ketones, POC UA: NEGATIVE mg/dL
Nitrite, UA: NEGATIVE
POC PROTEIN,UA: NEGATIVE
Spec Grav, UA: 1.01 (ref 1.010–1.025)
Urobilinogen, UA: 0.2 E.U./dL
pH, UA: 6 (ref 5.0–8.0)

## 2022-10-28 MED ORDER — DOXYCYCLINE HYCLATE 100 MG PO TABS
100.0000 mg | ORAL_TABLET | Freq: Two times a day (BID) | ORAL | 0 refills | Status: DC
Start: 2022-10-28 — End: 2022-12-22

## 2022-10-28 MED ORDER — TORSEMIDE 10 MG PO TABS
10.0000 mg | ORAL_TABLET | Freq: Every day | ORAL | 2 refills | Status: DC
Start: 2022-10-28 — End: 2022-12-01

## 2022-10-28 NOTE — Progress Notes (Addendum)
Established Patient Office Visit  Subjective:  Patient ID: Alicia Kelly, female    DOB: 1955-07-01  Age: 67 y.o. MRN: [id number]  CC:  Chief Complaint  Patient presents with   Medical Management of Chronic Issues    HPI Alicia Kelly presents for CHRONIC FOLLOW UP  Pt with history of IDDM for the past 10 years - currently she is taking Ozempic 2mg  weekly and Tresiba 30 units daily.  She states her glucose had been running randomly in the 140s -200s - seems to run higher when she has uti (which she does today) -   She does not check fasting glucose - checks a few hours after eating lunch.    She is due for eye exam and will schedule  Pt with history of GERD - stable on protonix 40mg  qd  Pt with history of hypertension for the past 10 years - she is currently on hyzaar 100/25mg  qd and  lasix 20mg  bid .  She denies chest pain or shortness of breath but is still having some swelling in lower extremities despite the use of lasix.  She was on demadex at one time and prefers to use that.  Will switch back to that medication.  She is currently seeing wound therapy for her lower extremity edema and ulcerations - has follow up appt this Friday Pt has requested a hospital bed and with her having this significant edema chronically she would benefit from being able to raise end of bed to help keep legs elevated and also have her do frequent position changes  Pt with history of hyperlipidemia and currently on lipitor 40mg  qd - she is trying to watch her diet .  Is due for labwork  Pt with history of epilepsy.  She does follow with neurology regularly.  Currently taking keppra and vimpat.  She has been having seizures again.  Recently had EEG this past Monday.  She has had a few episodes of falls - recently fell out of her bed and had to go to ED for head trauma and received 7 staples Requests hospital bed with rails  Pt with history of iron deficiency anemia.  She has seen Dr Melvyn Neth recently  and had iron infusions She has also been started on B12 injections - will have follow up appt with Dr Melvyn Neth in August  Pt complains of urinary frequency and urgency for the past few days    Past Medical History:  Diagnosis Date   Asthma    Calculus of gallbladder with chronic cholecystitis without obstruction 03/22/2017   Diabetes (HCC)    Epilepsy (HCC)    Hypertension    Sleep apnea     Past Surgical History:  Procedure Laterality Date   APPENDECTOMY     CHOLECYSTECTOMY     TONSILLECTOMY      Family History  Problem Relation Age of Onset   Colon cancer Mother        d. 26   Hypertension Father    Diabetes Father    Allergic rhinitis Sister    Hypertension Sister    Cancer - Colon Maternal Aunt    Cancer Maternal Uncle    Colon cancer Maternal Grandmother        dx 63s   Colon cancer Paternal Grandmother        dx 46s   Diabetes Paternal Grandmother    Breast cancer Neg Hx     Social History   Socioeconomic History   Marital status:  Widowed    Spouse name: Not on file   Number of children: 0   Years of education: Not on file   Highest education level: 10th grade  Occupational History   Occupation: Disabled  Tobacco Use   Smoking status: Never   Smokeless tobacco: Never  Vaping Use   Vaping Use: Never used  Substance and Sexual Activity   Alcohol use: Never   Drug use: Never   Sexual activity: Not Currently  Other Topics Concern   Not on file  Social History Narrative   Spouse passed away 10 yrs ago -- no children   Social Determinants of Health   Financial Resource Strain: Low Risk  (07/07/2022)   Overall Financial Resource Strain (CARDIA)    Difficulty of Paying Living Expenses: Not hard at all  Food Insecurity: No Food Insecurity (07/07/2022)   Hunger Vital Sign    Worried About Running Out of Food in the Last Year: Never true    Ran Out of Food in the Last Year: Never true  Transportation Needs: No Transportation Needs (07/07/2022)   PRAPARE  - Administrator, Civil Service (Medical): No    Lack of Transportation (Non-Medical): No  Physical Activity: Sufficiently Active (07/07/2022)   Exercise Vital Sign    Days of Exercise per Week: 5 days    Minutes of Exercise per Session: 30 min  Stress: No Stress Concern Present (07/07/2022)   [employer] of Occupational Health - Occupational Stress Questionnaire    Feeling of Stress : Not at all  Social Connections: Socially Isolated (07/07/2022)   Social Connection and Isolation Panel [NHANES]    Frequency of Communication with Friends and Family: More than three times a week    Frequency of Social Gatherings with Friends and Family: Once a week    Attends Religious Services: Never    Database administrator or Organizations: No    Attends Banker Meetings: Never    Marital Status: Widowed  Intimate Partner Violence: Not At Risk (07/07/2022)   Humiliation, Afraid, Rape, and Kick questionnaire    Fear of Current or Ex-Partner: No    Emotionally Abused: No    Physically Abused: No    Sexually Abused: No     Current Outpatient Medications:    Accu-Chek Softclix Lancets lancets, TEST BLOOD SUGAR THREE TIMES DAILY, Disp: 300 each, Rfl: 1   atorvastatin (LIPITOR) 40 MG tablet, Take 1 tablet (40 mg total) by mouth daily., Disp: 90 tablet, Rfl: 0   Blood Glucose Calibration (ACCU-CHEK AVIVA) SOLN, , Disp: , Rfl:    Blood Glucose Monitoring Suppl (ACCU-CHEK AVIVA PLUS) w/Device KIT, 1 each by Does not apply route 3 (three) times daily., Disp: 1 kit, Rfl: 0   clotrimazole-betamethasone (LOTRISONE) cream, APPLY ONE APPLICATION TOPICALLY DAILY, Disp: 30 g, Rfl: 2   cyclobenzaprine (FLEXERIL) 10 MG tablet, Take 1 tablet (10 mg total) by mouth 3 (three) times daily as needed for muscle spasms., Disp: 30 tablet, Rfl: 0   fluticasone (FLONASE) 50 MCG/ACT nasal spray, Place 2 sprays into both nostrils daily., Disp: , Rfl:    folic acid (FOLVITE) 1 MG tablet, Take 1 mg by  mouth daily., Disp: , Rfl:    glucose blood (ACCU-CHEK GUIDE) test strip, Check sugars threes times daily, Disp: 300 each, Rfl: 3   Insulin Pen Needle (B-D UF III MINI PEN NEEDLES) 31G X 5 MM MISC, USE 1 PEN NEEDLE DAILY, Disp: 100 each, Rfl: 2   lacosamide (  VIMPAT) 200 MG TABS tablet, Take 1.5 tablets (300 mg total) by mouth 2 (two) times daily., Disp: 180 tablet, Rfl: 2   levETIRAcetam (KEPPRA) 1000 MG tablet, Take 1 tablet (1,000 mg total) by mouth in the morning, at noon, and at bedtime., Disp: 90 tablet, Rfl: 6   losartan-hydrochlorothiazide (HYZAAR) 100-25 MG tablet, Take 1 tablet by mouth daily., Disp: 90 tablet, Rfl: 2   meclizine (ANTIVERT) 25 MG tablet, Take 25 mg by mouth every 6 (six) hours as needed for dizziness., Disp: , Rfl:    montelukast (SINGULAIR) 10 MG tablet, Take 1 tablet (10 mg total) by mouth daily. as directed, Disp: 90 tablet, Rfl: 2   mupirocin ointment (BACTROBAN) 2 %, Apply 1 Application topically 2 (two) times daily., Disp: 22 g, Rfl: 0   ondansetron (ZOFRAN-ODT) 4 MG disintegrating tablet, , Disp: , Rfl:    pantoprazole (PROTONIX) 40 MG tablet, Take 1 tablet (40 mg total) by mouth 2 (two) times daily., Disp: 180 tablet, Rfl: 1   SANTYL 250 UNIT/GM ointment, Apply topically., Disp: , Rfl:    Semaglutide, 2 MG/DOSE, 8 MG/3ML SOPN, Inject 2 mg as directed once a week., Disp: 3 mL, Rfl: 5   torsemide (DEMADEX) 10 MG tablet, Take 1 tablet (10 mg total) by mouth daily., Disp: 60 tablet, Rfl: 2   TRESIBA FLEXTOUCH 100 UNIT/ML FlexTouch Pen, Inject 30 Units into the skin daily., Disp: 15 mL, Rfl: 2   Vitamin D, Ergocalciferol, (DRISDOL) 1.25 MG (50000 UNIT) CAPS capsule, Twice a week, Disp: 8 capsule, Rfl: 3   Allergies  Allergen Reactions   Feraheme [Ferumoxytol] Shortness Of Breath and Other (See Comments)    Facial flushing   Levofloxacin Other (See Comments)    Throat swelling   Lisinopril Hives   Nitrofurantoin Swelling    Throat swelling   Penicillins  Anaphylaxis   Clarithromycin Other (See Comments)    seizures   Sulfamethoxazole-Trimethoprim     Causes seizures    CONSTITUTIONAL: Negative for chills, fatigue, fever, unintentional weight gain and unintentional weight loss.  E/N/T: Negative for ear pain, nasal congestion and sore throat.  CARDIOVASCULAR: Negative for chest pain, dizziness, palpitations and pedal edema.  RESPIRATORY: Negative for recent cough and dyspnea.  GASTROINTESTINAL: Negative for abdominal pain, acid reflux symptoms, constipation, diarrhea, nausea and vomiting.  GU - see HPI MSK: Negative for arthralgias and myalgias.  INTEGUMENTARY: Negative for rash.  NEUROLOGICAL: Negative for dizziness and headaches.  PSYCHIATRIC: Negative for sleep disturbance and to question depression screen.  Negative for depression, negative for anhedonia.        Objective:  PHYSICAL EXAM:   VS: BP 102/64 (BP Location: Left Arm, Patient Position: Sitting, Cuff Size: Large)   Pulse 85   Temp (!) 97.1 F (36.2 C) (Temporal)   Ht 5\' 6"  (1.676 m)   Wt 227 lb 3.2 oz (103.1 kg)   LMP  (LMP Unknown)   SpO2 97%   BMI 36.67 kg/m   GEN: Well nourished, well developed, in no acute distress  Cardiac: RRR; no murmurs, rubs, or gallops,- lower legs are wrapped with bandages Respiratory:  normal respiratory rate and pattern with no distress - normal breath sounds with no rales, rhonchi, wheezes or rubs  MS: no deformity or atrophy  Skin: warm and dry, no rash   Psych: euthymic mood, appropriate affect and demeanor  Office Visit on 10/28/2022  Component Date Value Ref Range Status   Color, UA 10/28/2022 yellow  yellow Final   Clarity, UA  10/28/2022 cloudy (A)  clear Final   Glucose, UA 10/28/2022 negative  negative mg/dL Final   Bilirubin, UA 91/47/8295 negative  negative Final   Ketones, POC UA 10/28/2022 negative  negative mg/dL Final   Spec Grav, UA 62/13/0865 1.010  1.010 - 1.025 Final   Blood, UA 10/28/2022 negative  negative  Final   pH, UA 10/28/2022 6.0  5.0 - 8.0 Final   POC PROTEIN,UA 10/28/2022 negative  negative, trace Final   Urobilinogen, UA 10/28/2022 0.2  0.2 or 1.0 E.U./dL Final   Nitrite, UA 78/46/9629 Negative  Negative Final   Leukocytes, UA 10/28/2022 Trace (A)  Negative Final      Health Maintenance Due  Topic Date Due   OPHTHALMOLOGY EXAM  Never done   Zoster Vaccines- Shingrix (1 of 2) Never done   PAP SMEAR-Modifier  Never done    There are no preventive care reminders to display for this patient.  Lab Results  Component Value Date   TSH 1.210 06/18/2022   Lab Results  Component Value Date   WBC 11.2 09/25/2022   HGB 9.9 (A) 09/25/2022   HCT 30 (A) 09/25/2022   MCV 89 07/23/2022   PLT 609 (A) 09/25/2022   Lab Results  Component Value Date   NA 138 09/25/2022   K 3.0 (L) 09/25/2022   CO2 27 09/25/2022   GLUCOSE 213 (H) 09/25/2022   BUN 25 (H) 09/25/2022   CREATININE 0.97 09/25/2022   BILITOT 0.4 09/25/2022   ALKPHOS 66 09/25/2022   AST 17 09/25/2022   ALT 21 09/25/2022   PROT 6.6 09/25/2022   ALBUMIN 3.1 (L) 09/25/2022   CALCIUM 9.1 09/25/2022   ANIONGAP 11 09/25/2022   EGFR 75 07/23/2022   Lab Results  Component Value Date   CHOL 118 06/18/2022   Lab Results  Component Value Date   HDL 40 06/18/2022   Lab Results  Component Value Date   LDLCALC 49 06/18/2022   Lab Results  Component Value Date   TRIG 172 (H) 06/18/2022   Lab Results  Component Value Date   CHOLHDL 3.0 06/18/2022   Lab Results  Component Value Date   HGBA1C 8.4 (H) 06/18/2022      Assessment & Plan:   Problem List Items Addressed This Visit       Cardiovascular and Mediastinum   Essential hypertension   Relevant Orders   CBC with Differential/Platelet   Comprehensive metabolic panel   TSH Continue current meds             Digestive   GERD (gastroesophageal reflux disease) Continue meds     Nervous and Auditory   Generalized epilepsy (HCC) Continue  meds Follow up with neurologist as directed     Other   Hyperlipidemia - Primary   Relevant Orders   Comprehensive metabolic panel   Lipid panel Continue meds Diet modifications   BMI 40.0-44.9, adult (HCC) Diet modifications Increase physical activity   Pedal edema Stop lasix and switch back to demadex Elevate legs and recommend hospital bed for positioning - needs to keep legs  elevated and have pt changing positioning frequently    Other Visit Diagnoses     Acute cystitis without hematuria       Relevant Orders   POCT URINALYSIS DIP (CLINITEK) (Completed) Rx for doxycycline   Recurrent falls Recommend hospital bed                  Insulin dependent type 2 diabetes mellitus (HCC)  Relevant Orders   CBC with Differential/Platelet   Comprehensive metabolic panel   Hemoglobin A1c    Continue current meds Well balanced diet       Meds ordered this encounter  Medications   torsemide (DEMADEX) 10 MG tablet    Sig: Take 1 tablet (10 mg total) by mouth daily.    Dispense:  60 tablet    Refill:  2    Order Specific Question:   Supervising Provider    AnswerCorey Harold    Follow-up: Return in about 3 months (around 01/28/2023) for chronic fasting follow-up.    SARA R Shama Monfils, PA-C

## 2022-10-29 ENCOUNTER — Inpatient Hospital Stay: Payer: 59

## 2022-10-29 ENCOUNTER — Other Ambulatory Visit: Payer: Self-pay | Admitting: Physician Assistant

## 2022-10-29 VITALS — BP 120/63 | HR 76 | Temp 98.0°F | Resp 16 | Ht 66.0 in | Wt 229.1 lb

## 2022-10-29 DIAGNOSIS — E538 Deficiency of other specified B group vitamins: Secondary | ICD-10-CM | POA: Diagnosis not present

## 2022-10-29 DIAGNOSIS — E1169 Type 2 diabetes mellitus with other specified complication: Secondary | ICD-10-CM

## 2022-10-29 DIAGNOSIS — D509 Iron deficiency anemia, unspecified: Secondary | ICD-10-CM

## 2022-10-29 LAB — COMPREHENSIVE METABOLIC PANEL
ALT: 22 IU/L (ref 0–32)
AST: 21 IU/L (ref 0–40)
Albumin/Globulin Ratio: 1.6
Albumin: 3.9 g/dL (ref 3.9–4.9)
Alkaline Phosphatase: 94 IU/L (ref 44–121)
BUN/Creatinine Ratio: 28 (ref 12–28)
BUN: 24 mg/dL (ref 8–27)
Bilirubin Total: 0.3 mg/dL (ref 0.0–1.2)
CO2: 25 mmol/L (ref 20–29)
Calcium: 9.9 mg/dL (ref 8.7–10.3)
Chloride: 97 mmol/L (ref 96–106)
Creatinine, Ser: 0.85 mg/dL (ref 0.57–1.00)
Globulin, Total: 2.5 g/dL (ref 1.5–4.5)
Glucose: 239 mg/dL — ABNORMAL HIGH (ref 70–99)
Potassium: 3.8 mmol/L (ref 3.5–5.2)
Sodium: 137 mmol/L (ref 134–144)
Total Protein: 6.4 g/dL (ref 6.0–8.5)
eGFR: 76 mL/min/{1.73_m2} (ref >59)

## 2022-10-29 LAB — LIPID PANEL
Chol/HDL Ratio: 3.1 ratio (ref 0.0–4.4)
Cholesterol, Total: 110 mg/dL (ref 100–199)
HDL: 35 mg/dL — ABNORMAL LOW (ref >39)
LDL Chol Calc (NIH): 44 mg/dL (ref 0–99)
Triglycerides: 189 mg/dL — ABNORMAL HIGH (ref 0–149)
VLDL Cholesterol Cal: 31 mg/dL (ref 5–40)

## 2022-10-29 LAB — CBC WITH DIFFERENTIAL/PLATELET
Basophils Absolute: 0 10*3/uL (ref 0.0–0.2)
Basos: 0 %
EOS (ABSOLUTE): 0.2 10*3/uL (ref 0.0–0.4)
Eos: 2 %
Hematocrit: 36.4 % (ref 34.0–46.6)
Hemoglobin: 11.7 g/dL (ref 11.1–15.9)
Immature Grans (Abs): 0 10*3/uL (ref 0.0–0.1)
Immature Granulocytes: 0 %
Lymphocytes Absolute: 1.3 10*3/uL (ref 0.7–3.1)
Lymphs: 13 %
MCH: 29.3 pg (ref 26.6–33.0)
MCHC: 32.1 g/dL (ref 31.5–35.7)
MCV: 91 fL (ref 79–97)
Monocytes Absolute: 0.8 10*3/uL (ref 0.1–0.9)
Monocytes: 8 %
Neutrophils Absolute: 7.8 10*3/uL — ABNORMAL HIGH (ref 1.4–7.0)
Neutrophils: 77 %
Platelets: 516 10*3/uL — ABNORMAL HIGH (ref 150–450)
RBC: 3.99 x10E6/uL (ref 3.77–5.28)
RDW: 13.9 % (ref 11.7–15.4)
WBC: 10.2 10*3/uL (ref 3.4–10.8)

## 2022-10-29 LAB — URINE CULTURE

## 2022-10-29 LAB — TSH: TSH: 0.717 u[IU]/mL (ref 0.450–4.500)

## 2022-10-29 LAB — HEMOGLOBIN A1C
Est. average glucose Bld gHb Est-mCnc: 194 mg/dL
Hgb A1c MFr Bld: 8.4 % — ABNORMAL HIGH (ref 4.8–5.6)

## 2022-10-29 LAB — VITAMIN D 25 HYDROXY (VIT D DEFICIENCY, FRACTURES): Vit D, 25-Hydroxy: 59.8 ng/mL (ref 30.0–100.0)

## 2022-10-29 MED ORDER — CYANOCOBALAMIN 1000 MCG/ML IJ SOLN
1000.0000 ug | Freq: Once | INTRAMUSCULAR | Status: AC
  Administered 2022-10-29: 1000 ug via INTRAMUSCULAR
  Filled 2022-10-29: qty 1

## 2022-10-29 MED ORDER — TRESIBA FLEXTOUCH 100 UNIT/ML ~~LOC~~ SOPN
PEN_INJECTOR | SUBCUTANEOUS | 2 refills | Status: DC
Start: 2022-10-29 — End: 2022-11-03

## 2022-11-03 ENCOUNTER — Other Ambulatory Visit: Payer: Self-pay

## 2022-11-03 DIAGNOSIS — E1169 Type 2 diabetes mellitus with other specified complication: Secondary | ICD-10-CM

## 2022-11-03 MED ORDER — TRESIBA FLEXTOUCH 100 UNIT/ML ~~LOC~~ SOPN
PEN_INJECTOR | SUBCUTANEOUS | 2 refills | Status: DC
Start: 2022-11-03 — End: 2022-12-16

## 2022-11-05 ENCOUNTER — Inpatient Hospital Stay: Payer: 59

## 2022-11-05 VITALS — BP 125/63 | HR 70 | Temp 98.1°F | Resp 18 | Ht 66.0 in | Wt 225.0 lb

## 2022-11-05 DIAGNOSIS — D509 Iron deficiency anemia, unspecified: Secondary | ICD-10-CM

## 2022-11-05 DIAGNOSIS — E538 Deficiency of other specified B group vitamins: Secondary | ICD-10-CM | POA: Diagnosis not present

## 2022-11-05 MED ORDER — CYANOCOBALAMIN 1000 MCG/ML IJ SOLN
1000.0000 ug | Freq: Once | INTRAMUSCULAR | Status: AC
  Administered 2022-11-05: 1000 ug via INTRAMUSCULAR
  Filled 2022-11-05: qty 1

## 2022-11-07 ENCOUNTER — Other Ambulatory Visit: Payer: Self-pay | Admitting: Physician Assistant

## 2022-11-07 DIAGNOSIS — B354 Tinea corporis: Secondary | ICD-10-CM

## 2022-11-11 ENCOUNTER — Telehealth: Payer: Self-pay | Admitting: Physician Assistant

## 2022-11-11 NOTE — Telephone Encounter (Signed)
[facility]

## 2022-11-16 IMAGING — MG DIGITAL SCREENING BILAT W/ CAD
6 series · 6 of 6 positions shown · non-contrast
Comparison: Previous exam(s).

CLINICAL DATA: Screening.

EXAM:
DIGITAL SCREENING BILATERAL MAMMOGRAM WITH CAD
TECHNIQUE: Bilateral screening digital craniocaudal and mediolateral oblique
mammograms were obtained. The images were evaluated with
computer-aided detection.

[R CC (1 of 2)]
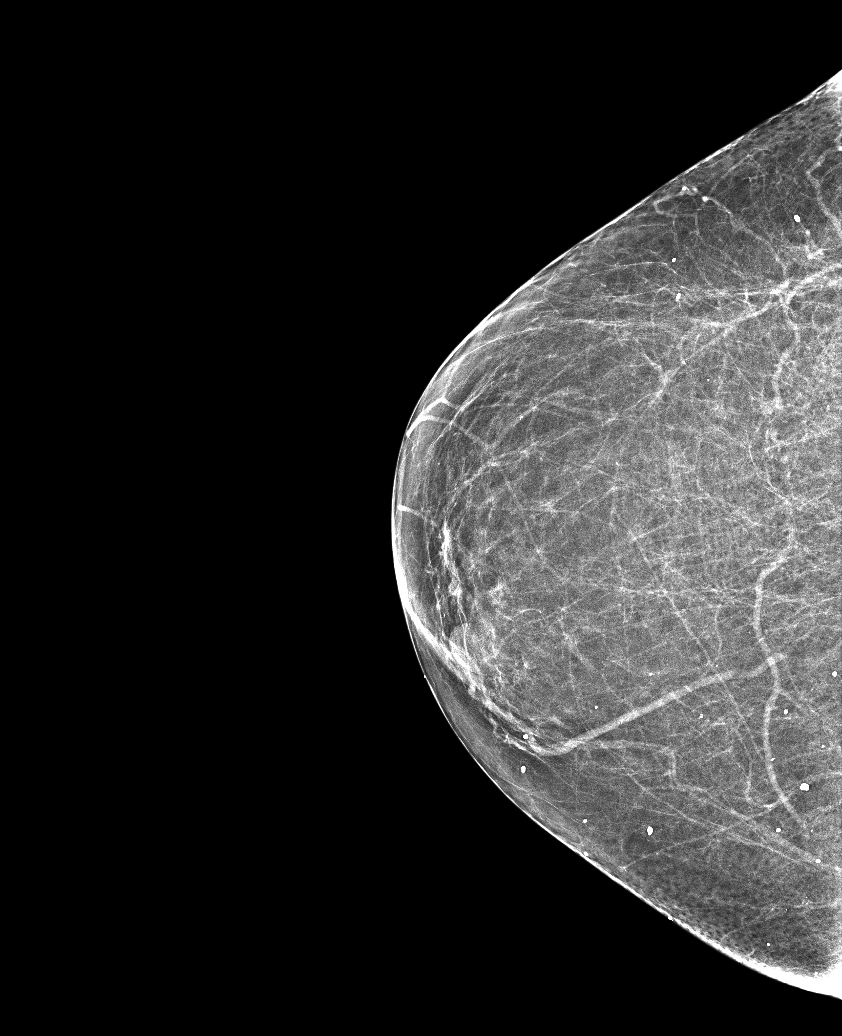

[R MLO]
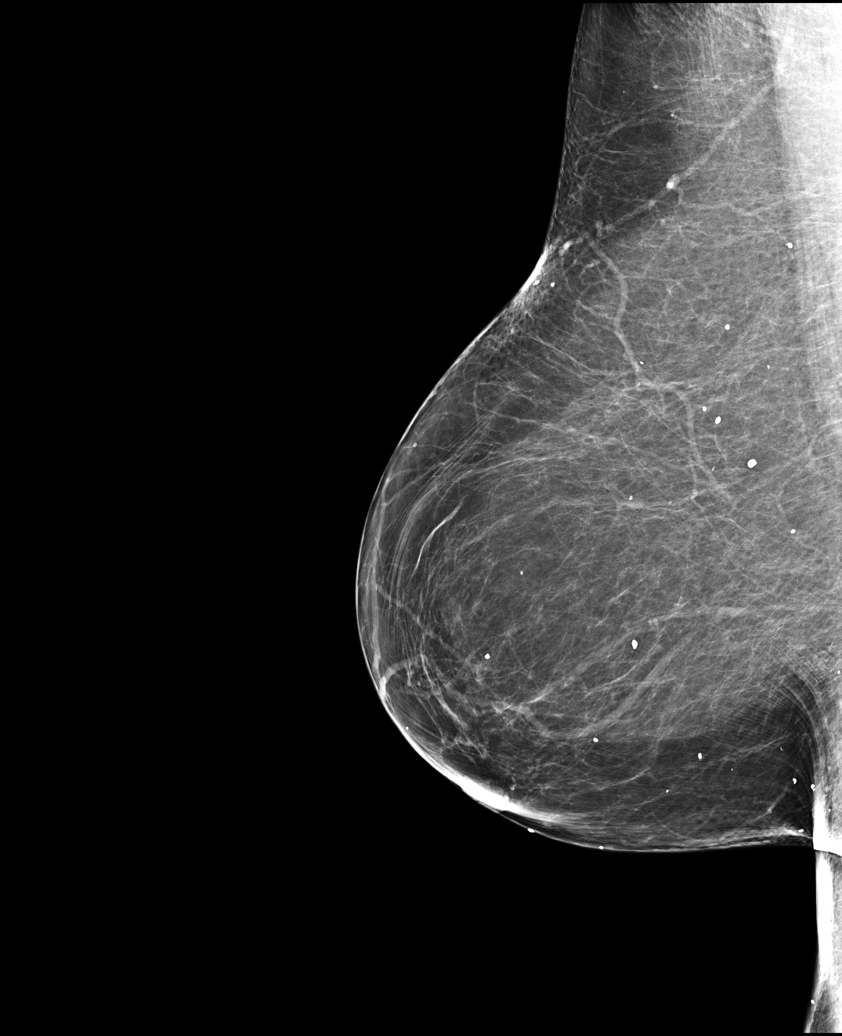

[L MLO (1 of 2)]
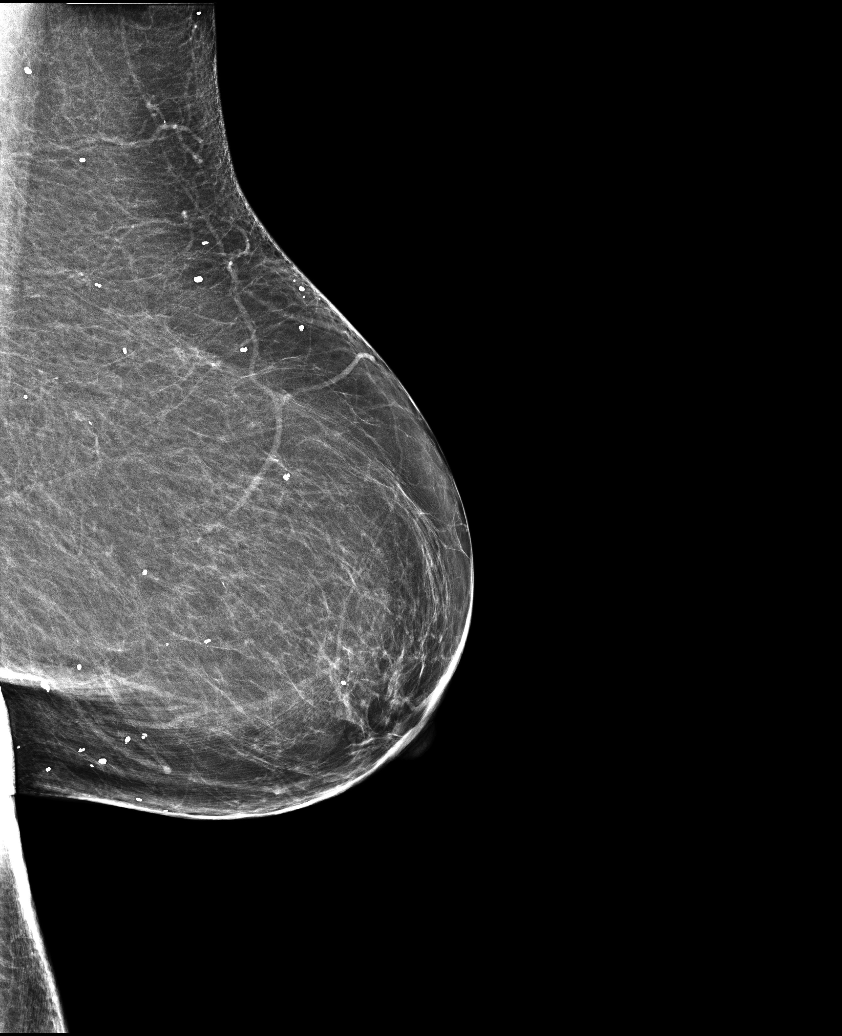

[L CC]
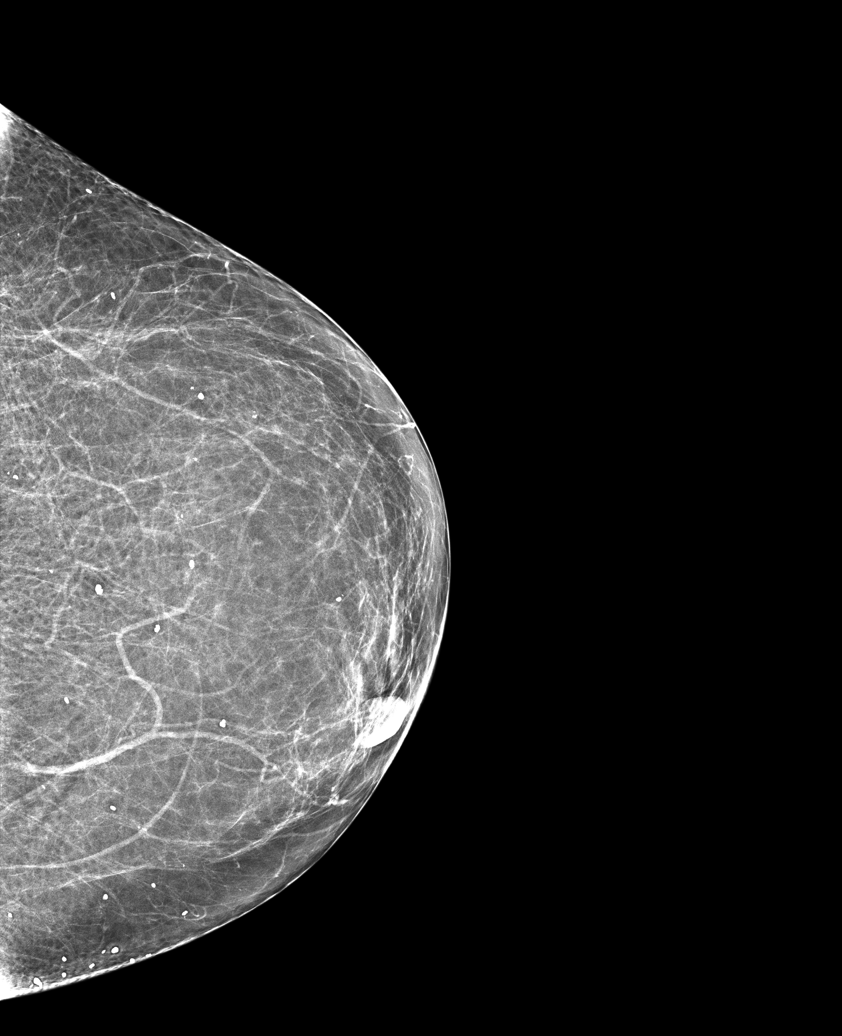

[L MLO (2 of 2)]
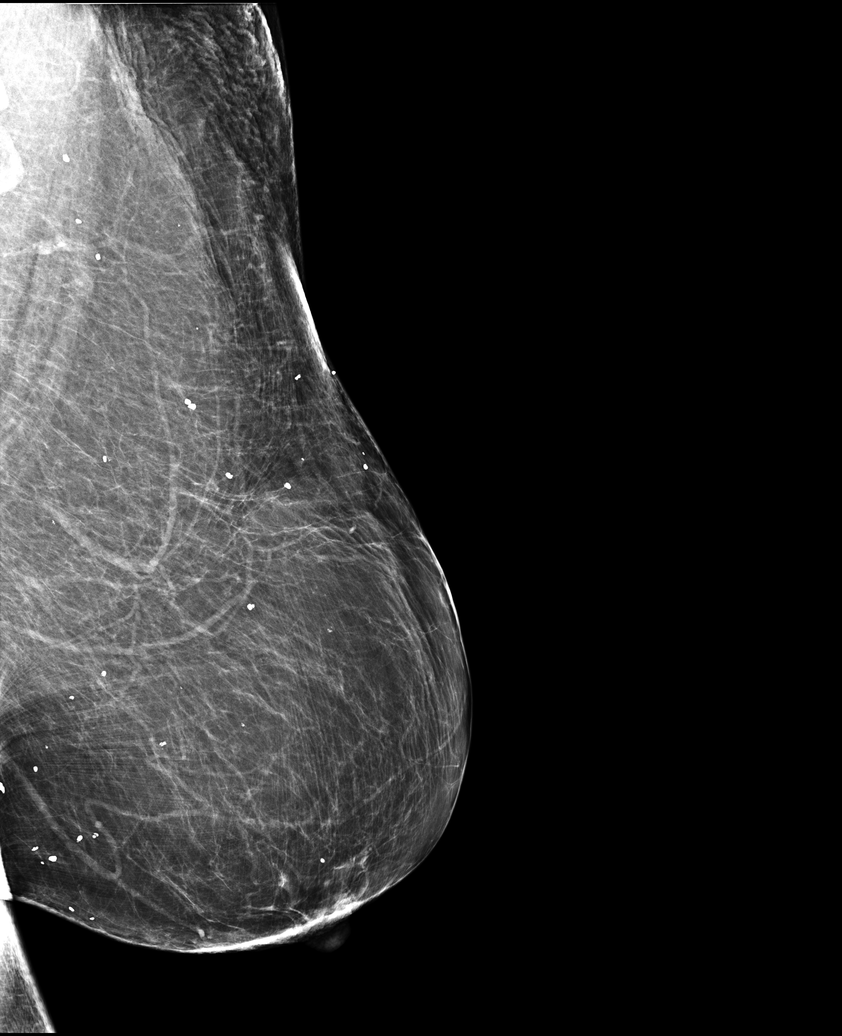

[R CC (2 of 2)]
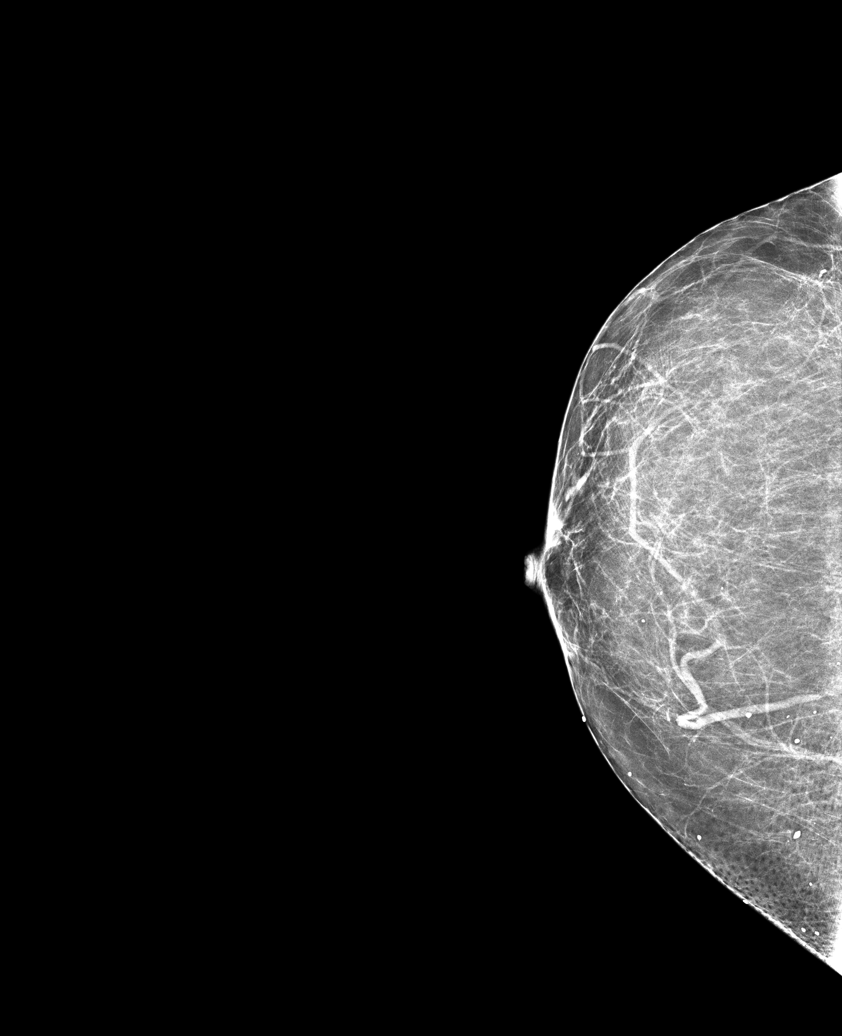

[6 of 6 positions shown; findings below may reference images not displayed]

ACR Breast Density Category b: There are scattered areas of
fibroglandular density.
FINDINGS: There are no findings suspicious for malignancy.
IMPRESSION: No mammographic evidence of malignancy. A result letter of this
screening mammogram will be mailed directly to the patient.

RECOMMENDATION:
Screening mammogram in one year. (Code:[id number])

BI-RADS CATEGORY  1: Negative.

## 2022-11-23 ENCOUNTER — Other Ambulatory Visit: Payer: Self-pay | Admitting: Family Medicine

## 2022-11-23 DIAGNOSIS — E782 Mixed hyperlipidemia: Secondary | ICD-10-CM

## 2022-12-01 ENCOUNTER — Other Ambulatory Visit: Payer: Self-pay

## 2022-12-01 DIAGNOSIS — R600 Localized edema: Secondary | ICD-10-CM

## 2022-12-01 MED ORDER — TORSEMIDE 10 MG PO TABS
10.0000 mg | ORAL_TABLET | Freq: Two times a day (BID) | ORAL | 2 refills | Status: DC
Start: 2022-12-01 — End: 2022-12-02

## 2022-12-01 NOTE — Telephone Encounter (Signed)
Patient is calling stating that she was having issues with [facility] and that they were trying to charge her for 60 day supply and they had only given her 30 day supply. She is requesting if you can send her a refill of her fluid pill to [facility] which is where she wants to go to from now on.

## 2022-12-02 ENCOUNTER — Other Ambulatory Visit: Payer: Self-pay | Admitting: Physician Assistant

## 2022-12-02 DIAGNOSIS — R600 Localized edema: Secondary | ICD-10-CM

## 2022-12-04 ENCOUNTER — Encounter: Payer: Self-pay | Admitting: Oncology

## 2022-12-04 NOTE — Addendum Note (Signed)
Addended by: Domenic Schwab on: 12/04/2022 04:34 PM   Modules accepted: Orders

## 2022-12-06 ENCOUNTER — Other Ambulatory Visit: Payer: Self-pay | Admitting: Physician Assistant

## 2022-12-06 DIAGNOSIS — E119 Type 2 diabetes mellitus without complications: Secondary | ICD-10-CM

## 2022-12-07 ENCOUNTER — Inpatient Hospital Stay: Payer: 59 | Attending: Oncology

## 2022-12-07 VITALS — BP 121/66 | HR 79 | Temp 97.6°F | Resp 18 | Ht 66.0 in | Wt 227.0 lb

## 2022-12-07 DIAGNOSIS — E538 Deficiency of other specified B group vitamins: Secondary | ICD-10-CM | POA: Diagnosis present

## 2022-12-07 DIAGNOSIS — D509 Iron deficiency anemia, unspecified: Secondary | ICD-10-CM

## 2022-12-07 MED ORDER — CYANOCOBALAMIN 1000 MCG/ML IJ SOLN
1000.0000 ug | Freq: Once | INTRAMUSCULAR | Status: AC
  Administered 2022-12-07: 1000 ug via INTRAMUSCULAR
  Filled 2022-12-07: qty 1

## 2022-12-09 ENCOUNTER — Other Ambulatory Visit: Payer: Self-pay | Admitting: Family Medicine

## 2022-12-09 DIAGNOSIS — I10 Essential (primary) hypertension: Secondary | ICD-10-CM

## 2022-12-16 ENCOUNTER — Other Ambulatory Visit: Payer: Self-pay

## 2022-12-16 DIAGNOSIS — E119 Type 2 diabetes mellitus without complications: Secondary | ICD-10-CM

## 2022-12-16 DIAGNOSIS — E1159 Type 2 diabetes mellitus with other circulatory complications: Secondary | ICD-10-CM

## 2022-12-16 MED ORDER — TRESIBA FLEXTOUCH 100 UNIT/ML ~~LOC~~ SOPN
PEN_INJECTOR | SUBCUTANEOUS | 2 refills | Status: DC
Start: 2022-12-16 — End: 2023-02-03

## 2022-12-16 MED ORDER — OZEMPIC (2 MG/DOSE) 8 MG/3ML ~~LOC~~ SOPN
2.0000 mg | PEN_INJECTOR | SUBCUTANEOUS | 5 refills | Status: DC
Start: 2022-12-16 — End: 2023-02-03

## 2022-12-16 NOTE — Telephone Encounter (Signed)
Patient called and requested refills for her insulin to go to [facility] she states that she no longer uses [facility] and wants it sent there.

## 2022-12-21 ENCOUNTER — Telehealth: Payer: Self-pay

## 2022-12-21 ENCOUNTER — Encounter: Payer: Self-pay | Admitting: Oncology

## 2022-12-21 NOTE — Telephone Encounter (Signed)
Patient called and stated that her legs are burning, swollen, and has some red spots on them, and think they may be infected, Also she may have a UTI. Denies any fever, fatigue, nausea   Patient stated she has not been to the wound clinic in a while.  Appointment schedule for 12/22/22 at 10:20 AM - Okay Per Marianne Sofia, PA-C

## 2022-12-22 ENCOUNTER — Ambulatory Visit (INDEPENDENT_AMBULATORY_CARE_PROVIDER_SITE_OTHER): Payer: Medicare PPO | Admitting: Physician Assistant

## 2022-12-22 ENCOUNTER — Encounter: Payer: Self-pay | Admitting: Physician Assistant

## 2022-12-22 VITALS — BP 90/64 | HR 71 | Temp 97.2°F | Ht 66.0 in | Wt 225.8 lb

## 2022-12-22 DIAGNOSIS — L97911 Non-pressure chronic ulcer of unspecified part of right lower leg limited to breakdown of skin: Secondary | ICD-10-CM | POA: Diagnosis not present

## 2022-12-22 DIAGNOSIS — L97921 Non-pressure chronic ulcer of unspecified part of left lower leg limited to breakdown of skin: Secondary | ICD-10-CM

## 2022-12-22 DIAGNOSIS — R600 Localized edema: Secondary | ICD-10-CM | POA: Diagnosis not present

## 2022-12-22 DIAGNOSIS — N3000 Acute cystitis without hematuria: Secondary | ICD-10-CM | POA: Diagnosis not present

## 2022-12-22 LAB — POCT URINALYSIS DIP (CLINITEK)
Bilirubin, UA: NEGATIVE
Blood, UA: NEGATIVE
Glucose, UA: NEGATIVE mg/dL
Ketones, POC UA: NEGATIVE mg/dL
Nitrite, UA: NEGATIVE
POC PROTEIN,UA: NEGATIVE
Spec Grav, UA: 1.015 (ref 1.010–1.025)
Urobilinogen, UA: 0.2 E.U./dL
pH, UA: 6 (ref 5.0–8.0)

## 2022-12-22 MED ORDER — DOXYCYCLINE HYCLATE 100 MG PO TABS: 100.0000 mg | ORAL_TABLET | Freq: Two times a day (BID) | ORAL | 0 refills | Status: DC

## 2022-12-22 MED ORDER — TORSEMIDE 10 MG PO TABS
ORAL_TABLET | ORAL | Status: DC
Start: 2022-12-22 — End: 2023-02-02

## 2022-12-22 NOTE — Progress Notes (Signed)
Subjective:  Patient ID: Alicia Kelly, female    DOB: 01/02/1956  Age: 67 y.o. MRN: [id number]  Chief Complaint  Patient presents with   Leg Swelling    Bilateral    HPI Pt complains of bilateral lower leg swelling which is chronic for her however it has worsened in the past few weeks.  She is taking demadex 10mg  bid and trying to keep legs elevated when she can.  Because the swelling has worsened she is now having small ulcers/blisters arise again on lower legs.  We have tried treating pt in past with unna boots which she refuses to wear - she has also seen wound clinic in past for treatment however she has not been in several weeks.  She states her insurance has changed but is agreeable to a new referral for treatment because until her legs heal she will not be able to have orthopedic surgery on shoulder  Pt complains of urinary urgency and dysuria for the past few days.  Denies fever or abdominal pain     10/28/2022   10:41 AM 07/07/2022   10:31 AM 06/18/2022   10:05 AM 07/03/2020   10:06 AM  Depression screen PHQ 2/9  Decreased Interest 0 0 0 0  Down, Depressed, Hopeless 0 0 0 0  PHQ - 2 Score 0 0 0 0        03/02/2022   11:00 AM 05/28/2022    9:28 AM 06/18/2022   10:04 AM 07/07/2022   10:35 AM 10/28/2022   10:41 AM  Fall Risk  Falls in the past year?   0 0 1  Was there an injury with Fall?   0 0 1  Fall Risk Category Calculator   0 0 2  (RETIRED) Patient Fall Risk Level Low fall risk Low fall risk     Patient at Risk for Falls Due to   No Fall Risks No Fall Risks History of fall(s);No Fall Risks  Fall risk Follow up    Falls evaluation completed;Education provided Falls evaluation completed     ROS CONSTITUTIONAL: Negative for chills, fatigue, fever,   CARDIOVASCULAR: Negative for chest pain, dizziness, palpitations - has 3+pitting edema RESPIRATORY: Negative for recent cough and dyspnea.  GASTROINTESTINAL: Negative for abdominal pain, acid reflux symptoms,  constipation, diarrhea, nausea and vomiting. GU - see HPI  MSK: chronic shoulder pain INTEGUMENTARY: see HPI    Current Outpatient Medications:    Accu-Chek Softclix Lancets lancets, TEST BLOOD SUGAR THREE TIMES DAILY, Disp: 300 each, Rfl: 1   atorvastatin (LIPITOR) 40 MG tablet, TAKE ONE TABLET BY MOUTH DAILY, Disp: 90 tablet, Rfl: 0   Blood Glucose Calibration (ACCU-CHEK AVIVA) SOLN, , Disp: , Rfl:    Blood Glucose Monitoring Suppl (ACCU-CHEK AVIVA PLUS) w/Device KIT, 1 each by Does not apply route 3 (three) times daily., Disp: 1 kit, Rfl: 0   clotrimazole-betamethasone (LOTRISONE) cream, APPLY ONE APPLICATION TOPICALLY DAILY, Disp: 30 g, Rfl: 2   cyclobenzaprine (FLEXERIL) 10 MG tablet, Take 1 tablet (10 mg total) by mouth 3 (three) times daily as needed for muscle spasms., Disp: 30 tablet, Rfl: 0   doxycycline (VIBRA-TABS) 100 MG tablet, Take 1 tablet (100 mg total) by mouth 2 (two) times daily., Disp: 20 tablet, Rfl: 0   fluticasone (FLONASE) 50 MCG/ACT nasal spray, Place 2 sprays into both nostrils daily., Disp: , Rfl:    folic acid (FOLVITE) 1 MG tablet, Take 1 mg by mouth daily., Disp: , Rfl:    glucose  blood (ACCU-CHEK GUIDE) test strip, Check sugars threes times daily, Disp: 300 each, Rfl: 3   Insulin Pen Needle (B-D UF III MINI PEN NEEDLES) 31G X 5 MM MISC, USE 1 PEN NEEDLE DAILY, Disp: 100 each, Rfl: 2   lacosamide (VIMPAT) 200 MG TABS tablet, Take 1.5 tablets (300 mg total) by mouth 2 (two) times daily., Disp: 180 tablet, Rfl: 2   levETIRAcetam (KEPPRA) 1000 MG tablet, Take 1 tablet (1,000 mg total) by mouth in the morning, at noon, and at bedtime., Disp: 90 tablet, Rfl: 6   losartan-hydrochlorothiazide (HYZAAR) 100-25 MG tablet, TAKE 1 TABLET BY MOUTH EVERY DAY, Disp: 90 tablet, Rfl: 2   meclizine (ANTIVERT) 25 MG tablet, Take 25 mg by mouth every 6 (six) hours as needed for dizziness., Disp: , Rfl:    montelukast (SINGULAIR) 10 MG tablet, Take 1 tablet (10 mg total) by mouth  daily. as directed, Disp: 90 tablet, Rfl: 2   mupirocin ointment (BACTROBAN) 2 %, Apply 1 Application topically 2 (two) times daily., Disp: 22 g, Rfl: 0   ondansetron (ZOFRAN-ODT) 4 MG disintegrating tablet, , Disp: , Rfl:    pantoprazole (PROTONIX) 40 MG tablet, Take 1 tablet (40 mg total) by mouth 2 (two) times daily., Disp: 180 tablet, Rfl: 1   SANTYL 250 UNIT/GM ointment, Apply topically., Disp: , Rfl:    Semaglutide, 2 MG/DOSE, (OZEMPIC, 2 MG/DOSE,) 8 MG/3ML SOPN, Inject 2 mg into the skin once a week., Disp: 3 mL, Rfl: 5   TRESIBA FLEXTOUCH 100 UNIT/ML FlexTouch Pen, Inject 35 units daily, Disp: 15 mL, Rfl: 2   Vitamin D, Ergocalciferol, (DRISDOL) 1.25 MG (50000 UNIT) CAPS capsule, Twice a week, Disp: 8 capsule, Rfl: 3   torsemide (DEMADEX) 10 MG tablet, 2 po bid, Disp: , Rfl:   Past Medical History:  Diagnosis Date   Asthma    Calculus of gallbladder with chronic cholecystitis without obstruction 03/22/2017   Diabetes (HCC)    Epilepsy (HCC)    Hypertension    Sleep apnea    Objective:  PHYSICAL EXAM:   BP 90/64 (BP Location: Left Arm, Patient Position: Sitting, Cuff Size: Large)   Pulse 71   Temp (!) 97.2 F (36.2 C) (Temporal)   Ht 5\' 6"  (1.676 m)   Wt 225 lb 12.8 oz (102.4 kg)   LMP  (LMP Unknown)   SpO2 98%   BMI 36.45 kg/m    GEN: Well nourished, well developed, in no acute distress  Cardiac: RRR; no murmurs, - 3+pitting edema Respiratory:  normal respiratory rate and pattern with no distress - normal breath sounds with no rales, rhonchi, wheezes or rubs Skin: generalized rubor of lower legs and small ulcerations noted Psych: euthymic mood, appropriate affect and demeanor  Office Visit on 12/22/2022  Component Date Value Ref Range Status   Color, UA 12/22/2022 yellow  yellow Final   Clarity, UA 12/22/2022 clear  clear Final   Glucose, UA 12/22/2022 negative  negative mg/dL Final   Bilirubin, UA 66/10/3014 negative  negative Final   Ketones, POC UA 12/22/2022  negative  negative mg/dL Final   Spec Grav, UA 05/26/3233 1.015  1.010 - 1.025 Final   Blood, UA 12/22/2022 negative  negative Final   pH, UA 12/22/2022 6.0  5.0 - 8.0 Final   POC PROTEIN,UA 12/22/2022 negative  negative, trace Final   Urobilinogen, UA 12/22/2022 0.2  0.2 or 1.0 E.U./dL Final   Nitrite, UA 57/32/2025 Negative  Negative Final   Leukocytes, UA 12/22/2022 Trace (A)  Negative Final    Assessment & Plan:    Acute cystitis without hematuria -     POCT URINALYSIS DIP (CLINITEK) -     Doxycycline Hyclate; Take 1 tablet (100 mg total) by mouth 2 (two) times daily.  Dispense: 20 tablet; Refill: 0 -     Urine Culture  Ulcers of both lower extremities, limited to breakdown of skin (HCC) -     Ambulatory referral to Wound Clinic  Bilateral leg edema -     Ambulatory referral to Wound Clinic -     Torsemide10mg  ; 2 po bid ---- increased from 1 po bid Elevate legs     Follow-up: Return in about 4 weeks (around 01/19/2023) for chronic fasting follow-up.  An After Visit Summary was printed and given to the patient.  Jettie Pagan [facility] [phone]

## 2022-12-24 ENCOUNTER — Other Ambulatory Visit: Payer: Self-pay

## 2022-12-24 DIAGNOSIS — I152 Hypertension secondary to endocrine disorders: Secondary | ICD-10-CM

## 2022-12-24 MED ORDER — ACCU-CHEK GUIDE VI STRP
ORAL_STRIP | 3 refills | Status: DC
Start: 2022-12-24 — End: 2023-06-15

## 2022-12-27 NOTE — Progress Notes (Unsigned)
[facility]  [address] [place],  [place]  [address] [phone]  Clinic Day:  12/28/2022  Referring physician: Marianne Sofia, PA-C  HISTORY OF PRESENT ILLNESS:  The patient is a 67 y.o. female  with leukocytosis.  Furthermore, recent labs show iron, vitamin B12, and folic acid deficiency to where she was given therapies to replenish all 3 vitamins.  She comes in today to reassess her hemoglobin and see how well she responded to the vitamin replenishment interventions.  Since her last visit, the patient has been doing fairly well.  Of note, she continues to have problems with cellulitis and lymphedema in her legs to where she has needed repeated courses of antibiotics.  She is scheduled to see wound care again in the forthcoming weeks to have her lymphedema reevaluated.  She denies having increased fatigue or any overt forms of blood loss which concern her for progressive anemia.  PHYSICAL EXAM:  Blood pressure 112/61, pulse 71, temperature 97.6 F (36.4 C), resp. rate 16, height 5\' 6"  (1.676 m), weight 223 lb 4.8 oz (101.3 kg), SpO2 100%. Wt Readings from Last 3 Encounters:  12/28/22 223 lb 4.8 oz (101.3 kg)  12/22/22 225 lb 12.8 oz (102.4 kg)  12/07/22 227 lb 0.6 oz (103 kg)   Body mass index is 36.04 kg/m. Performance status (ECOG): 2 - Symptomatic, <50% confined to bed Physical Exam Constitutional:      Appearance: Normal appearance. She is not ill-appearing.  HENT:     Mouth/Throat:     Mouth: Mucous membranes are moist.     Pharynx: Oropharynx is clear. No oropharyngeal exudate or posterior oropharyngeal erythema.  Cardiovascular:     Rate and Rhythm: Normal rate and regular rhythm.     Heart sounds: No murmur heard.    No friction rub. No gallop.  Pulmonary:     Effort: Pulmonary effort is normal. No respiratory distress.     Breath sounds: Normal breath sounds. No wheezing, rhonchi or rales.  Abdominal:     General: Bowel sounds are  normal. There is no distension.     Palpations: Abdomen is soft. There is no mass.     Tenderness: There is no abdominal tenderness.  Musculoskeletal:        General: No swelling.     Right lower leg: No edema.     Left lower leg: No edema.  Lymphadenopathy:     Cervical: No cervical adenopathy.     Upper Body:     Right upper body: No supraclavicular or axillary adenopathy.     Left upper body: No supraclavicular or axillary adenopathy.     Lower Body: No right inguinal adenopathy. No left inguinal adenopathy.  Skin:    General: Skin is warm.     Coloration: Skin is not jaundiced.     Findings: No lesion or rash.  Neurological:     General: No focal deficit present.     Mental Status: She is alert and oriented to person, place, and time. Mental status is at baseline.  Psychiatric:        Mood and Affect: Mood normal.        Behavior: Behavior normal.        Thought Content: Thought content normal.    LABS:     Iron b12 folat irone pending  ASSESSMENT & PLAN:  A 67 y.o. female who I follow for leukocytosis and iron deficiency anemia.  When evaluating her labs today, her hemoglobin  has marginally improved despite receiving IV iron, B12 injections, and oral folate over these past months.     I will see this patient back in 4 months for repeat clinical assessment.  The patient understands all the plans discussed today and is in agreement with them.   Josehua Hammar Kirby Funk, MD

## 2022-12-28 ENCOUNTER — Encounter: Payer: Self-pay | Admitting: Oncology

## 2022-12-28 ENCOUNTER — Inpatient Hospital Stay: Payer: Medicare PPO

## 2022-12-28 ENCOUNTER — Telehealth: Payer: Self-pay

## 2022-12-28 ENCOUNTER — Other Ambulatory Visit: Payer: Self-pay | Admitting: Oncology

## 2022-12-28 ENCOUNTER — Inpatient Hospital Stay: Payer: Medicare PPO | Attending: Oncology | Admitting: Oncology

## 2022-12-28 VITALS — BP 112/61 | HR 71 | Temp 97.6°F | Resp 16 | Ht 66.0 in | Wt 223.3 lb

## 2022-12-28 DIAGNOSIS — E538 Deficiency of other specified B group vitamins: Secondary | ICD-10-CM | POA: Insufficient documentation

## 2022-12-28 DIAGNOSIS — D649 Anemia, unspecified: Secondary | ICD-10-CM | POA: Diagnosis not present

## 2022-12-28 DIAGNOSIS — D509 Iron deficiency anemia, unspecified: Secondary | ICD-10-CM | POA: Insufficient documentation

## 2022-12-28 DIAGNOSIS — D508 Other iron deficiency anemias: Secondary | ICD-10-CM

## 2022-12-28 DIAGNOSIS — D72829 Elevated white blood cell count, unspecified: Secondary | ICD-10-CM | POA: Diagnosis not present

## 2022-12-28 LAB — FERRITIN: Ferritin: 27 ng/mL (ref 11–307)

## 2022-12-28 LAB — IRON AND TIBC
Iron: 44 ug/dL (ref 28–170)
Saturation Ratios: 14 % (ref 10.4–31.8)
TIBC: 325 ug/dL (ref 250–450)
UIBC: 281 ug/dL

## 2022-12-28 LAB — CBC AND DIFFERENTIAL
HCT: 33 — AB (ref 36–46)
Hemoglobin: 10.9 — AB (ref 12.0–16.0)
Neutrophils Absolute: 7.77
Platelets: 541 10*3/uL — AB (ref 150–400)
WBC: 11.1

## 2022-12-28 LAB — CBC: RBC: 3.77 — AB (ref 3.87–5.11)

## 2022-12-28 LAB — FOLATE: Folate: 3.7 ng/mL — ABNORMAL LOW (ref >5.9)

## 2022-12-28 LAB — VITAMIN B12: Vitamin B-12: 605 pg/mL (ref 180–914)

## 2022-12-28 NOTE — Telephone Encounter (Signed)
Dr Melvyn Neth reviewed labs, recommends IV iron, and ensure taking folic acid 1mg  every day.   Latest Reference Range & Units 12/28/22 16:36  Iron 28 - 170 ug/dL 44  UIBC ug/dL 295  TIBC 284 - 132 ug/dL 440  Saturation Ratios 10.4 - 31.8 % 14  Ferritin 11 - 307 ng/mL 27  Folate >5.9 ng/mL 3.7 (L)  Vitamin B12 180 - 914 pg/mL 605  (L): Data is abnormally low

## 2022-12-29 ENCOUNTER — Other Ambulatory Visit: Payer: Self-pay | Admitting: Oncology

## 2022-12-29 ENCOUNTER — Encounter: Payer: Self-pay | Admitting: Oncology

## 2022-12-30 ENCOUNTER — Encounter: Payer: Self-pay | Admitting: Oncology

## 2022-12-30 ENCOUNTER — Other Ambulatory Visit: Payer: Self-pay | Admitting: Oncology

## 2022-12-30 DIAGNOSIS — D508 Other iron deficiency anemias: Secondary | ICD-10-CM

## 2023-01-01 MED FILL — Iron Sucrose Inj 20 MG/ML (Fe Equiv): INTRAVENOUS | Qty: 10 | Status: AC

## 2023-01-04 ENCOUNTER — Inpatient Hospital Stay: Payer: Medicare PPO

## 2023-01-04 VITALS — BP 118/75 | HR 74 | Temp 98.1°F | Resp 18 | Ht 66.0 in | Wt 223.1 lb

## 2023-01-04 DIAGNOSIS — D509 Iron deficiency anemia, unspecified: Secondary | ICD-10-CM | POA: Diagnosis not present

## 2023-01-04 DIAGNOSIS — E538 Deficiency of other specified B group vitamins: Secondary | ICD-10-CM | POA: Diagnosis not present

## 2023-01-04 MED ORDER — ACETAMINOPHEN 325 MG PO TABS
650.0000 mg | ORAL_TABLET | Freq: Once | ORAL | Status: AC
  Administered 2023-01-04: 650 mg via ORAL
  Filled 2023-01-04: qty 2

## 2023-01-04 MED ORDER — FAMOTIDINE IN NACL 20-0.9 MG/50ML-% IV SOLN
20.0000 mg | Freq: Once | INTRAVENOUS | Status: AC
  Administered 2023-01-04: 20 mg via INTRAVENOUS
  Filled 2023-01-04: qty 50

## 2023-01-04 MED ORDER — SODIUM CHLORIDE 0.9 % IV SOLN: Freq: Once | INTRAVENOUS | Status: AC

## 2023-01-04 MED ORDER — SODIUM CHLORIDE 0.9 % IV SOLN
200.0000 mg | Freq: Once | INTRAVENOUS | Status: AC
  Administered 2023-01-04: 200 mg via INTRAVENOUS
  Filled 2023-01-04: qty 200

## 2023-01-04 NOTE — Patient Instructions (Signed)
 Iron Sucrose Injection What is this medication? IRON SUCROSE (EYE ern SOO krose) treats low levels of iron (iron deficiency anemia) in people with kidney disease. Iron is a mineral that plays an important role in making red blood cells, which carry oxygen from your lungs to the rest of your body. This medicine may be used for other purposes; ask your health care provider or pharmacist if you have questions. COMMON BRAND NAME(S): Venofer What should I tell my care team before I take this medication? They need to know if you have any of these conditions: Anemia not caused by low iron levels Heart disease High levels of iron in the blood Kidney disease Liver disease An unusual or allergic reaction to iron, other medications, foods, dyes, or preservatives Pregnant or trying to get pregnant Breastfeeding How should I use this medication? This medication is for infusion into a vein. It is given in a hospital or clinic setting. Talk to your care team about the use of this medication in children. While this medication may be prescribed for children as young as 2 years for selected conditions, precautions do apply. Overdosage: If you think you have taken too much of this medicine contact a poison control center or emergency room at once. NOTE: This medicine is only for you. Do not share this medicine with others. What if I miss a dose? Keep appointments for follow-up doses. It is important not to miss your dose. Call your care team if you are unable to keep an appointment. What may interact with this medication? Do not take this medication with any of the following: Deferoxamine Dimercaprol Other iron products This medication may also interact with the following: Chloramphenicol Deferasirox This list may not describe all possible interactions. Give your health care provider a list of all the medicines, herbs, non-prescription drugs, or dietary supplements you use. Also tell them if you smoke,  drink alcohol, or use illegal drugs. Some items may interact with your medicine. What should I watch for while using this medication? Visit your care team regularly. Tell your care team if your symptoms do not start to get better or if they get worse. You may need blood work done while you are taking this medication. You may need to follow a special diet. Talk to your care team. Foods that contain iron include: whole grains/cereals, dried fruits, beans, or peas, leafy green vegetables, and organ meats (liver, kidney). What side effects may I notice from receiving this medication? Side effects that you should report to your care team as soon as possible: Allergic reactions--skin rash, itching, hives, swelling of the face, lips, tongue, or throat Low blood pressure--dizziness, feeling faint or lightheaded, blurry vision Shortness of breath Side effects that usually do not require medical attention (report to your care team if they continue or are bothersome): Flushing Headache Joint pain Muscle pain Nausea Pain, redness, or irritation at injection site This list may not describe all possible side effects. Call your doctor for medical advice about side effects. You may report side effects to FDA at 1-800-FDA-1088. Where should I keep my medication? This medication is given in a hospital or clinic. It will not be stored at home. NOTE: This sheet is a summary. It may not cover all possible information. If you have questions about this medicine, talk to your doctor, pharmacist, or health care provider.  2024 Elsevier/Gold Standard (2022-10-09 00:00:00)

## 2023-01-05 MED FILL — Iron Sucrose Inj 20 MG/ML (Fe Equiv): INTRAVENOUS | Qty: 10 | Status: AC

## 2023-01-06 ENCOUNTER — Inpatient Hospital Stay: Payer: Medicare PPO

## 2023-01-06 VITALS — BP 107/58 | HR 72 | Temp 97.6°F | Resp 18 | Wt 224.0 lb

## 2023-01-06 DIAGNOSIS — D509 Iron deficiency anemia, unspecified: Secondary | ICD-10-CM

## 2023-01-06 DIAGNOSIS — E538 Deficiency of other specified B group vitamins: Secondary | ICD-10-CM | POA: Diagnosis not present

## 2023-01-06 MED ORDER — SODIUM CHLORIDE 0.9 % IV SOLN
200.0000 mg | Freq: Once | INTRAVENOUS | Status: AC
  Administered 2023-01-06: 200 mg via INTRAVENOUS
  Filled 2023-01-06: qty 200

## 2023-01-06 MED ORDER — SODIUM CHLORIDE 0.9 % IV SOLN: Freq: Once | INTRAVENOUS | Status: AC

## 2023-01-06 MED ORDER — FAMOTIDINE IN NACL 20-0.9 MG/50ML-% IV SOLN
20.0000 mg | Freq: Once | INTRAVENOUS | Status: AC
  Administered 2023-01-06: 20 mg via INTRAVENOUS
  Filled 2023-01-06: qty 50

## 2023-01-06 MED ORDER — ACETAMINOPHEN 325 MG PO TABS
650.0000 mg | ORAL_TABLET | Freq: Once | ORAL | Status: AC
  Administered 2023-01-06: 650 mg via ORAL
  Filled 2023-01-06: qty 2

## 2023-01-06 NOTE — Progress Notes (Signed)
Monday appt time changed at patient request

## 2023-01-06 NOTE — Patient Instructions (Signed)
 Iron Sucrose Injection What is this medication? IRON SUCROSE (EYE ern SOO krose) treats low levels of iron (iron deficiency anemia) in people with kidney disease. Iron is a mineral that plays an important role in making red blood cells, which carry oxygen from your lungs to the rest of your body. This medicine may be used for other purposes; ask your health care provider or pharmacist if you have questions. COMMON BRAND NAME(S): Venofer What should I tell my care team before I take this medication? They need to know if you have any of these conditions: Anemia not caused by low iron levels Heart disease High levels of iron in the blood Kidney disease Liver disease An unusual or allergic reaction to iron, other medications, foods, dyes, or preservatives Pregnant or trying to get pregnant Breastfeeding How should I use this medication? This medication is for infusion into a vein. It is given in a hospital or clinic setting. Talk to your care team about the use of this medication in children. While this medication may be prescribed for children as young as 2 years for selected conditions, precautions do apply. Overdosage: If you think you have taken too much of this medicine contact a poison control center or emergency room at once. NOTE: This medicine is only for you. Do not share this medicine with others. What if I miss a dose? Keep appointments for follow-up doses. It is important not to miss your dose. Call your care team if you are unable to keep an appointment. What may interact with this medication? Do not take this medication with any of the following: Deferoxamine Dimercaprol Other iron products This medication may also interact with the following: Chloramphenicol Deferasirox This list may not describe all possible interactions. Give your health care provider a list of all the medicines, herbs, non-prescription drugs, or dietary supplements you use. Also tell them if you smoke,  drink alcohol, or use illegal drugs. Some items may interact with your medicine. What should I watch for while using this medication? Visit your care team regularly. Tell your care team if your symptoms do not start to get better or if they get worse. You may need blood work done while you are taking this medication. You may need to follow a special diet. Talk to your care team. Foods that contain iron include: whole grains/cereals, dried fruits, beans, or peas, leafy green vegetables, and organ meats (liver, kidney). What side effects may I notice from receiving this medication? Side effects that you should report to your care team as soon as possible: Allergic reactions--skin rash, itching, hives, swelling of the face, lips, tongue, or throat Low blood pressure--dizziness, feeling faint or lightheaded, blurry vision Shortness of breath Side effects that usually do not require medical attention (report to your care team if they continue or are bothersome): Flushing Headache Joint pain Muscle pain Nausea Pain, redness, or irritation at injection site This list may not describe all possible side effects. Call your doctor for medical advice about side effects. You may report side effects to FDA at 1-800-FDA-1088. Where should I keep my medication? This medication is given in a hospital or clinic. It will not be stored at home. NOTE: This sheet is a summary. It may not cover all possible information. If you have questions about this medicine, talk to your doctor, pharmacist, or health care provider.  2024 Elsevier/Gold Standard (2022-10-09 00:00:00)

## 2023-01-07 ENCOUNTER — Encounter: Payer: Self-pay | Admitting: Oncology

## 2023-01-07 ENCOUNTER — Ambulatory Visit: Payer: 59

## 2023-01-07 ENCOUNTER — Other Ambulatory Visit: Payer: Self-pay

## 2023-01-07 DIAGNOSIS — E559 Vitamin D deficiency, unspecified: Secondary | ICD-10-CM

## 2023-01-07 MED ORDER — VITAMIN D (ERGOCALCIFEROL) 1.25 MG (50000 UNIT) PO CAPS
ORAL_CAPSULE | ORAL | 3 refills | Status: DC
Start: 2023-01-07 — End: 2023-02-03

## 2023-01-07 MED FILL — Iron Sucrose Inj 20 MG/ML (Fe Equiv): INTRAVENOUS | Qty: 10 | Status: AC

## 2023-01-08 ENCOUNTER — Inpatient Hospital Stay: Payer: Medicare PPO

## 2023-01-08 VITALS — BP 106/61 | HR 69 | Resp 18 | Wt 226.0 lb

## 2023-01-08 DIAGNOSIS — D509 Iron deficiency anemia, unspecified: Secondary | ICD-10-CM

## 2023-01-08 DIAGNOSIS — E538 Deficiency of other specified B group vitamins: Secondary | ICD-10-CM | POA: Diagnosis not present

## 2023-01-08 MED ORDER — HEPARIN SOD (PORK) LOCK FLUSH 100 UNIT/ML IV SOLN: 500.0000 [IU] | Freq: Once | INTRAVENOUS | Status: DC | PRN

## 2023-01-08 MED ORDER — CYANOCOBALAMIN 1000 MCG/ML IJ SOLN
1000.0000 ug | Freq: Once | INTRAMUSCULAR | Status: AC
  Administered 2023-01-08: 1000 ug via INTRAMUSCULAR
  Filled 2023-01-08: qty 1

## 2023-01-08 MED ORDER — HEPARIN SOD (PORK) LOCK FLUSH 100 UNIT/ML IV SOLN: 250.0000 [IU] | Freq: Once | INTRAVENOUS | Status: DC | PRN

## 2023-01-08 MED ORDER — SODIUM CHLORIDE 0.9% FLUSH: 10.0000 mL | Freq: Once | INTRAVENOUS | Status: DC | PRN

## 2023-01-08 MED ORDER — SODIUM CHLORIDE 0.9% FLUSH: 3.0000 mL | Freq: Once | INTRAVENOUS | Status: DC | PRN

## 2023-01-08 MED ORDER — FAMOTIDINE IN NACL 20-0.9 MG/50ML-% IV SOLN
20.0000 mg | Freq: Once | INTRAVENOUS | Status: AC
  Administered 2023-01-08: 20 mg via INTRAVENOUS
  Filled 2023-01-08: qty 50

## 2023-01-08 MED ORDER — ALTEPLASE 2 MG IJ SOLR: 2.0000 mg | Freq: Once | INTRAMUSCULAR | Status: DC | PRN

## 2023-01-08 MED ORDER — SODIUM CHLORIDE 0.9 % IV SOLN: Freq: Once | INTRAVENOUS | Status: AC

## 2023-01-08 MED ORDER — ACETAMINOPHEN 325 MG PO TABS
650.0000 mg | ORAL_TABLET | Freq: Once | ORAL | Status: AC
  Administered 2023-01-08: 650 mg via ORAL
  Filled 2023-01-08: qty 2

## 2023-01-08 MED ORDER — SODIUM CHLORIDE 0.9 % IV SOLN
200.0000 mg | Freq: Once | INTRAVENOUS | Status: AC
  Administered 2023-01-08: 200 mg via INTRAVENOUS
  Filled 2023-01-08: qty 200

## 2023-01-08 MED FILL — Iron Sucrose Inj 20 MG/ML (Fe Equiv): INTRAVENOUS | Qty: 10 | Status: AC

## 2023-01-11 ENCOUNTER — Inpatient Hospital Stay: Payer: Medicare PPO

## 2023-01-11 ENCOUNTER — Encounter: Payer: Self-pay | Admitting: Oncology

## 2023-01-11 VITALS — BP 130/62 | HR 90 | Temp 97.8°F | Resp 18 | Ht 66.0 in | Wt 223.0 lb

## 2023-01-11 DIAGNOSIS — D509 Iron deficiency anemia, unspecified: Secondary | ICD-10-CM | POA: Diagnosis not present

## 2023-01-11 DIAGNOSIS — E538 Deficiency of other specified B group vitamins: Secondary | ICD-10-CM | POA: Diagnosis not present

## 2023-01-11 MED ORDER — SODIUM CHLORIDE 0.9 % IV SOLN
200.0000 mg | Freq: Once | INTRAVENOUS | Status: AC
  Administered 2023-01-11: 200 mg via INTRAVENOUS
  Filled 2023-01-11: qty 200

## 2023-01-11 MED ORDER — FAMOTIDINE IN NACL 20-0.9 MG/50ML-% IV SOLN
20.0000 mg | Freq: Once | INTRAVENOUS | Status: AC
  Administered 2023-01-11: 20 mg via INTRAVENOUS
  Filled 2023-01-11: qty 50

## 2023-01-11 MED ORDER — SODIUM CHLORIDE 0.9 % IV SOLN: Freq: Once | INTRAVENOUS | Status: AC

## 2023-01-11 MED ORDER — SODIUM CHLORIDE 0.9 % IV SOLN: Freq: Once | INTRAVENOUS | Status: DC | PRN

## 2023-01-11 MED ORDER — ACETAMINOPHEN 325 MG PO TABS
650.0000 mg | ORAL_TABLET | Freq: Once | ORAL | Status: AC
  Administered 2023-01-11: 650 mg via ORAL
  Filled 2023-01-11: qty 2

## 2023-01-11 MED ORDER — ONDANSETRON HCL 4 MG/2ML IJ SOLN
8.0000 mg | Freq: Once | INTRAMUSCULAR | Status: AC
  Administered 2023-01-11: 8 mg via INTRAVENOUS
  Filled 2023-01-11: qty 4

## 2023-01-11 MED ORDER — ALUM & MAG HYDROXIDE-SIMETH 200-200-20 MG/5ML PO SUSP
30.0000 mL | Freq: Once | ORAL | Status: AC
  Administered 2023-01-11: 30 mL via ORAL
  Filled 2023-01-11: qty 30

## 2023-01-11 NOTE — Progress Notes (Signed)
1155- Iron restarted at half rate-

## 2023-01-11 NOTE — Patient Instructions (Signed)
 Iron Sucrose Injection What is this medication? IRON SUCROSE (EYE ern SOO krose) treats low levels of iron (iron deficiency anemia) in people with kidney disease. Iron is a mineral that plays an important role in making red blood cells, which carry oxygen from your lungs to the rest of your body. This medicine may be used for other purposes; ask your health care provider or pharmacist if you have questions. COMMON BRAND NAME(S): Venofer What should I tell my care team before I take this medication? They need to know if you have any of these conditions: Anemia not caused by low iron levels Heart disease High levels of iron in the blood Kidney disease Liver disease An unusual or allergic reaction to iron, other medications, foods, dyes, or preservatives Pregnant or trying to get pregnant Breastfeeding How should I use this medication? This medication is for infusion into a vein. It is given in a hospital or clinic setting. Talk to your care team about the use of this medication in children. While this medication may be prescribed for children as young as 2 years for selected conditions, precautions do apply. Overdosage: If you think you have taken too much of this medicine contact a poison control center or emergency room at once. NOTE: This medicine is only for you. Do not share this medicine with others. What if I miss a dose? Keep appointments for follow-up doses. It is important not to miss your dose. Call your care team if you are unable to keep an appointment. What may interact with this medication? Do not take this medication with any of the following: Deferoxamine Dimercaprol Other iron products This medication may also interact with the following: Chloramphenicol Deferasirox This list may not describe all possible interactions. Give your health care provider a list of all the medicines, herbs, non-prescription drugs, or dietary supplements you use. Also tell them if you smoke,  drink alcohol, or use illegal drugs. Some items may interact with your medicine. What should I watch for while using this medication? Visit your care team regularly. Tell your care team if your symptoms do not start to get better or if they get worse. You may need blood work done while you are taking this medication. You may need to follow a special diet. Talk to your care team. Foods that contain iron include: whole grains/cereals, dried fruits, beans, or peas, leafy green vegetables, and organ meats (liver, kidney). What side effects may I notice from receiving this medication? Side effects that you should report to your care team as soon as possible: Allergic reactions--skin rash, itching, hives, swelling of the face, lips, tongue, or throat Low blood pressure--dizziness, feeling faint or lightheaded, blurry vision Shortness of breath Side effects that usually do not require medical attention (report to your care team if they continue or are bothersome): Flushing Headache Joint pain Muscle pain Nausea Pain, redness, or irritation at injection site This list may not describe all possible side effects. Call your doctor for medical advice about side effects. You may report side effects to FDA at 1-800-FDA-1088. Where should I keep my medication? This medication is given in a hospital or clinic. It will not be stored at home. NOTE: This sheet is a summary. It may not cover all possible information. If you have questions about this medicine, talk to your doctor, pharmacist, or health care provider.  2024 Elsevier/Gold Standard (2022-10-09 00:00:00)

## 2023-01-11 NOTE — Progress Notes (Signed)
Upon assessment patient began to complain of stinging at IV site. Infusion stopped and site assessed. IV was infiltrated. IV catheter removed intact. Patient noted to have bruising at site. Gauze dressing placed over site. Patient states she does not want to be stuck again for the remainder of IV infusion. Ice compress placed on IV site per Allayne Butcher [facility].

## 2023-01-11 NOTE — Progress Notes (Signed)
Patient ambulatory to bathroom- complains of nausea and light headedness- D[facility] in to see patient- Pt. States that she has not eaten today- small snack provided- Iron slowed to 223/hr without improvement- Iron stopped and NS 250 bag hung

## 2023-01-12 MED FILL — Iron Sucrose Inj 20 MG/ML (Fe Equiv): INTRAVENOUS | Qty: 10 | Status: AC

## 2023-01-13 ENCOUNTER — Telehealth: Payer: Self-pay | Admitting: Oncology

## 2023-01-13 ENCOUNTER — Other Ambulatory Visit: Payer: Self-pay

## 2023-01-13 ENCOUNTER — Inpatient Hospital Stay: Payer: Medicare PPO

## 2023-01-13 DIAGNOSIS — I10 Essential (primary) hypertension: Secondary | ICD-10-CM

## 2023-01-13 MED ORDER — ONDANSETRON 4 MG PO TBDP: 4.0000 mg | ORAL_TABLET | Freq: Three times a day (TID) | ORAL | 0 refills | Status: DC | PRN

## 2023-01-13 NOTE — Telephone Encounter (Signed)
01/13/23 Rescheduled IV iron.Patient stated she was feeling ill.

## 2023-01-20 ENCOUNTER — Encounter: Payer: Self-pay | Admitting: Oncology

## 2023-01-20 MED FILL — Iron Sucrose Inj 20 MG/ML (Fe Equiv): INTRAVENOUS | Qty: 10 | Status: AC

## 2023-01-21 ENCOUNTER — Inpatient Hospital Stay: Payer: Medicare Other | Attending: Oncology

## 2023-01-21 VITALS — BP 124/59 | HR 69 | Temp 98.6°F | Resp 20 | Ht 66.0 in | Wt 229.0 lb

## 2023-01-21 DIAGNOSIS — D509 Iron deficiency anemia, unspecified: Secondary | ICD-10-CM | POA: Diagnosis present

## 2023-01-21 DIAGNOSIS — E538 Deficiency of other specified B group vitamins: Secondary | ICD-10-CM | POA: Diagnosis not present

## 2023-01-21 DIAGNOSIS — Z23 Encounter for immunization: Secondary | ICD-10-CM | POA: Diagnosis not present

## 2023-01-21 MED ORDER — FAMOTIDINE IN NACL 20-0.9 MG/50ML-% IV SOLN
20.0000 mg | Freq: Once | INTRAVENOUS | Status: AC
  Administered 2023-01-21: 20 mg via INTRAVENOUS
  Filled 2023-01-21: qty 50

## 2023-01-21 MED ORDER — SODIUM CHLORIDE 0.9 % IV SOLN: Freq: Once | INTRAVENOUS | Status: AC

## 2023-01-21 MED ORDER — ACETAMINOPHEN 325 MG PO TABS
650.0000 mg | ORAL_TABLET | Freq: Once | ORAL | Status: AC
  Administered 2023-01-21: 650 mg via ORAL
  Filled 2023-01-21: qty 2

## 2023-01-21 MED ORDER — SODIUM CHLORIDE 0.9 % IV SOLN
200.0000 mg | Freq: Once | INTRAVENOUS | Status: AC
  Administered 2023-01-21: 200 mg via INTRAVENOUS
  Filled 2023-01-21: qty 200

## 2023-01-21 MED ORDER — INFLUENZA VAC A&B SURF ANT ADJ 0.5 ML IM SUSY
0.5000 mL | PREFILLED_SYRINGE | Freq: Once | INTRAMUSCULAR | Status: AC
  Administered 2023-01-21: 0.5 mL via INTRAMUSCULAR
  Filled 2023-01-21: qty 0.5

## 2023-01-21 NOTE — Patient Instructions (Signed)
 Iron Sucrose Injection What is this medication? IRON SUCROSE (EYE ern SOO krose) treats low levels of iron (iron deficiency anemia) in people with kidney disease. Iron is a mineral that plays an important role in making red blood cells, which carry oxygen from your lungs to the rest of your body. This medicine may be used for other purposes; ask your health care provider or pharmacist if you have questions. COMMON BRAND NAME(S): Venofer What should I tell my care team before I take this medication? They need to know if you have any of these conditions: Anemia not caused by low iron levels Heart disease High levels of iron in the blood Kidney disease Liver disease An unusual or allergic reaction to iron, other medications, foods, dyes, or preservatives Pregnant or trying to get pregnant Breastfeeding How should I use this medication? This medication is for infusion into a vein. It is given in a hospital or clinic setting. Talk to your care team about the use of this medication in children. While this medication may be prescribed for children as young as 2 years for selected conditions, precautions do apply. Overdosage: If you think you have taken too much of this medicine contact a poison control center or emergency room at once. NOTE: This medicine is only for you. Do not share this medicine with others. What if I miss a dose? Keep appointments for follow-up doses. It is important not to miss your dose. Call your care team if you are unable to keep an appointment. What may interact with this medication? Do not take this medication with any of the following: Deferoxamine Dimercaprol Other iron products This medication may also interact with the following: Chloramphenicol Deferasirox This list may not describe all possible interactions. Give your health care provider a list of all the medicines, herbs, non-prescription drugs, or dietary supplements you use. Also tell them if you smoke,  drink alcohol, or use illegal drugs. Some items may interact with your medicine. What should I watch for while using this medication? Visit your care team regularly. Tell your care team if your symptoms do not start to get better or if they get worse. You may need blood work done while you are taking this medication. You may need to follow a special diet. Talk to your care team. Foods that contain iron include: whole grains/cereals, dried fruits, beans, or peas, leafy green vegetables, and organ meats (liver, kidney). What side effects may I notice from receiving this medication? Side effects that you should report to your care team as soon as possible: Allergic reactions--skin rash, itching, hives, swelling of the face, lips, tongue, or throat Low blood pressure--dizziness, feeling faint or lightheaded, blurry vision Shortness of breath Side effects that usually do not require medical attention (report to your care team if they continue or are bothersome): Flushing Headache Joint pain Muscle pain Nausea Pain, redness, or irritation at injection site This list may not describe all possible side effects. Call your doctor for medical advice about side effects. You may report side effects to FDA at 1-800-FDA-1088. Where should I keep my medication? This medication is given in a hospital or clinic. It will not be stored at home. NOTE: This sheet is a summary. It may not cover all possible information. If you have questions about this medicine, talk to your doctor, pharmacist, or health care provider.  2024 Elsevier/Gold Standard (2022-10-09 00:00:00)

## 2023-01-21 NOTE — Progress Notes (Signed)
Per Dorothy Spark, RPH pt will not need an additional dose of iron this course.  Pt had nausea and iv infiltration last dose.  Pt informed that this will be her last dose this cycle. Pt verbalizes understanding.

## 2023-02-02 ENCOUNTER — Ambulatory Visit (INDEPENDENT_AMBULATORY_CARE_PROVIDER_SITE_OTHER): Payer: Medicare Other | Admitting: Physician Assistant

## 2023-02-02 ENCOUNTER — Encounter: Payer: Self-pay | Admitting: Physician Assistant

## 2023-02-02 VITALS — BP 102/60 | HR 74 | Temp 97.6°F | Resp 16 | Ht 66.0 in | Wt 228.6 lb

## 2023-02-02 DIAGNOSIS — E119 Type 2 diabetes mellitus without complications: Secondary | ICD-10-CM | POA: Diagnosis not present

## 2023-02-02 DIAGNOSIS — K2100 Gastro-esophageal reflux disease with esophagitis, without bleeding: Secondary | ICD-10-CM

## 2023-02-02 DIAGNOSIS — L97911 Non-pressure chronic ulcer of unspecified part of right lower leg limited to breakdown of skin: Secondary | ICD-10-CM | POA: Diagnosis not present

## 2023-02-02 DIAGNOSIS — R2681 Unsteadiness on feet: Secondary | ICD-10-CM

## 2023-02-02 DIAGNOSIS — R600 Localized edema: Secondary | ICD-10-CM

## 2023-02-02 DIAGNOSIS — E1169 Type 2 diabetes mellitus with other specified complication: Secondary | ICD-10-CM

## 2023-02-02 DIAGNOSIS — G40309 Generalized idiopathic epilepsy and epileptic syndromes, not intractable, without status epilepticus: Secondary | ICD-10-CM

## 2023-02-02 DIAGNOSIS — Z1231 Encounter for screening mammogram for malignant neoplasm of breast: Secondary | ICD-10-CM

## 2023-02-02 DIAGNOSIS — N3000 Acute cystitis without hematuria: Secondary | ICD-10-CM | POA: Diagnosis not present

## 2023-02-02 DIAGNOSIS — N958 Other specified menopausal and perimenopausal disorders: Secondary | ICD-10-CM | POA: Insufficient documentation

## 2023-02-02 DIAGNOSIS — L97921 Non-pressure chronic ulcer of unspecified part of left lower leg limited to breakdown of skin: Secondary | ICD-10-CM

## 2023-02-02 DIAGNOSIS — E559 Vitamin D deficiency, unspecified: Secondary | ICD-10-CM | POA: Insufficient documentation

## 2023-02-02 DIAGNOSIS — B354 Tinea corporis: Secondary | ICD-10-CM

## 2023-02-02 DIAGNOSIS — D508 Other iron deficiency anemias: Secondary | ICD-10-CM

## 2023-02-02 DIAGNOSIS — Z794 Long term (current) use of insulin: Secondary | ICD-10-CM

## 2023-02-02 LAB — POCT URINALYSIS DIP (CLINITEK)
Bilirubin, UA: NEGATIVE
Blood, UA: NEGATIVE
Glucose, UA: NEGATIVE mg/dL
Ketones, POC UA: NEGATIVE mg/dL
Nitrite, UA: NEGATIVE
POC PROTEIN,UA: NEGATIVE
Spec Grav, UA: 1.01 (ref 1.010–1.025)
Urobilinogen, UA: 0.2 U/dL
pH, UA: 6 (ref 5.0–8.0)

## 2023-02-02 MED ORDER — TORSEMIDE 20 MG PO TABS
20.0000 mg | ORAL_TABLET | Freq: Two times a day (BID) | ORAL | Status: DC
Start: 2023-02-02 — End: 2023-02-03

## 2023-02-02 MED ORDER — DOXYCYCLINE HYCLATE 100 MG PO TABS: 100.0000 mg | ORAL_TABLET | Freq: Two times a day (BID) | ORAL | 0 refills | Status: DC

## 2023-02-02 MED ORDER — CLOTRIMAZOLE-BETAMETHASONE 1-0.05 % EX CREA
TOPICAL_CREAM | Freq: Two times a day (BID) | CUTANEOUS | 2 refills | Status: DC
Start: 2023-02-02 — End: 2023-05-06

## 2023-02-02 NOTE — Progress Notes (Signed)
Subjective:  Patient ID: Alicia Kelly, female    DOB: April 08, 1956  Age: 67 y.o. MRN: [id number]  Chief Complaint  Patient presents with   Follow-up - chronic   Urinary Tract Infection    HPI Alicia Kelly has a history of type 2 Diabetes Mellitus for more than 5 years. Current treatment includes ozempic 2mg  weekly and tresiba 35 units qd -   She denies hypoglycemic episodes. She states blood glucose levels at home range from 140s to 200s. She  is consuming a heart healthy diet and exercising regularly. She had been treated by wound center for swelling in legs and feet but currently has been released. She is overdue for eye exam  Mixed hyperlipidemia  Pt presents with hyperlipidemia. Compliance with treatment has been fair - The patient is compliant with medications, and currently taking lipitor 40mg  qd  Pt with complaints of urinary urgency and dysuria for the past few days  Pt with diagnosis of generalized epilepsy - currently following with neurology and has appt due within the next month Taking vimpat and keppra  Pt presents for follow up of hypertension.  The patient is tolerating the medication well without side effects. Compliance with treatment has been good; including taking medication as directed  Taking hyzaar 100/25mg  qd  She is also taking demadex 20mg  bid for leg edema  Pt with GERD - taking protonix 40mg  qd - states she has been having problems with breakthrough symptoms recently.  She also has been diagnosed with iron def anemia and has received iron infusions - following with Dr Melvyn Neth Pt has not seen GI for evaluation since being diagnosed with the anemia - used to see Dr Charm Barges but would like to establish with Dr Jennye Boroughs for further evaluation  Pt with Vit D def - is taking weekly supplement - due for labwork  Pt is due for mammogram and dexa scan - would like to schedule  Pt states she is having trouble with her balance.  Has noted that she is having  to use her walker more and has had some recent falls - would like to be referred to PT for her gait instability I also mentioned seeing podiatry because she does have inturning of feet - would benefit from orthotics or fitted shoes but pt would like to see PT first  Pt continues to have swelling of lower legs with mild weeping of open lesions - legs are minimally red today and look better than I have ever seen - she has been released from the wound center for her legs at this time (but does follow for ulcer of buttock which I have not been made aware of until today)  Lab Results  Component Value Date   HGBA1C 8.4 (H) 10/28/2022   HGBA1C 8.4 (H) 06/18/2022   HGBA1C 7.5 (H) 01/28/2022        02/02/2023   10:45 AM 10/28/2022   10:41 AM 07/07/2022   10:31 AM 06/18/2022   10:05 AM 07/03/2020   10:06 AM  Depression screen PHQ 2/9  Decreased Interest 0 0 0 0 0  Down, Depressed, Hopeless 0 0 0 0 0  PHQ - 2 Score 0 0 0 0 0  Altered sleeping 0      Tired, decreased energy 0      Change in appetite 0      Feeling bad or failure about yourself  0      Trouble concentrating 0      Moving  slowly or fidgety/restless 0      Suicidal thoughts 0      PHQ-9 Score 0            05/28/2022    9:28 AM 06/18/2022   10:04 AM 07/07/2022   10:35 AM 10/28/2022   10:41 AM 02/02/2023   10:44 AM  Fall Risk  Falls in the past year?  0 0 1 1  Was there an injury with Fall?  0 0 1 1  Fall Risk Category Calculator  0 0 2 3  (RETIRED) Patient Fall Risk Level Low fall risk      Patient at Risk for Falls Due to  No Fall Risks No Fall Risks History of fall(s);No Fall Risks   Fall risk Follow up   Falls evaluation completed;Education provided Falls evaluation completed Falls evaluation completed     ROS CONSTITUTIONAL: Negative for chills, fatigue, fever, unintentional weight gain and unintentional weight loss.  E/N/T: Negative for ear pain, nasal congestion and sore throat.  CARDIOVASCULAR: Negative for chest pain,  dizziness, palpitations - has moderate pedal edema RESPIRATORY: Negative for recent cough and dyspnea.  GASTROINTESTINAL: has been having dyspepsia and trouble with GERD INTEGUMENTARY: see HPI NEUROLOGICAL: Negative for dizziness and headaches.  PSYCHIATRIC: Negative for sleep disturbance and to question depression screen.  Negative for depression, negative for anhedonia.    Current Outpatient Medications:    Accu-Chek Softclix Lancets lancets, TEST BLOOD SUGAR THREE TIMES DAILY, Disp: 300 each, Rfl: 1   atorvastatin (LIPITOR) 40 MG tablet, TAKE ONE TABLET BY MOUTH DAILY, Disp: 90 tablet, Rfl: 0   Blood Glucose Calibration (ACCU-CHEK AVIVA) SOLN, , Disp: , Rfl:    Blood Glucose Monitoring Suppl (ACCU-CHEK AVIVA PLUS) w/Device KIT, 1 each by Does not apply route 3 (three) times daily., Disp: 1 kit, Rfl: 0   doxycycline (VIBRA-TABS) 100 MG tablet, Take 1 tablet (100 mg total) by mouth 2 (two) times daily., Disp: 20 tablet, Rfl: 0   fluticasone (FLONASE) 50 MCG/ACT nasal spray, Place 2 sprays into both nostrils daily., Disp: , Rfl:    folic acid (FOLVITE) 1 MG tablet, Take 1 mg by mouth daily., Disp: , Rfl:    glucose blood (ACCU-CHEK GUIDE) test strip, Check sugars threes times daily, Disp: 300 each, Rfl: 3   Insulin Pen Needle (B-D UF III MINI PEN NEEDLES) 31G X 5 MM MISC, USE 1 PEN NEEDLE DAILY, Disp: 100 each, Rfl: 2   lacosamide (VIMPAT) 200 MG TABS tablet, Take 1.5 tablets (300 mg total) by mouth 2 (two) times daily., Disp: 180 tablet, Rfl: 2   levETIRAcetam (KEPPRA) 1000 MG tablet, Take 1 tablet (1,000 mg total) by mouth in the morning, at noon, and at bedtime., Disp: 90 tablet, Rfl: 6   losartan-hydrochlorothiazide (HYZAAR) 100-25 MG tablet, TAKE 1 TABLET BY MOUTH EVERY DAY, Disp: 90 tablet, Rfl: 2   meclizine (ANTIVERT) 25 MG tablet, Take 25 mg by mouth every 6 (six) hours as needed for dizziness., Disp: , Rfl:    montelukast (SINGULAIR) 10 MG tablet, Take 1 tablet (10 mg total) by mouth  daily. as directed, Disp: 90 tablet, Rfl: 2   mupirocin ointment (BACTROBAN) 2 %, Apply 1 Application topically 2 (two) times daily., Disp: 22 g, Rfl: 0   ondansetron (ZOFRAN-ODT) 4 MG disintegrating tablet, Take 1 tablet (4 mg total) by mouth every 8 (eight) hours as needed for nausea or vomiting., Disp: 20 tablet, Rfl: 0   pantoprazole (PROTONIX) 40 MG tablet, Take 1 tablet (40  mg total) by mouth 2 (two) times daily., Disp: 180 tablet, Rfl: 1   SANTYL 250 UNIT/GM ointment, Apply topically., Disp: , Rfl:    Semaglutide, 2 MG/DOSE, (OZEMPIC, 2 MG/DOSE,) 8 MG/3ML SOPN, Inject 2 mg into the skin once a week., Disp: 3 mL, Rfl: 5   torsemide (DEMADEX) 20 MG tablet, Take 1 tablet (20 mg total) by mouth 2 (two) times daily., Disp: , Rfl:    TRESIBA FLEXTOUCH 100 UNIT/ML FlexTouch Pen, Inject 35 units daily, Disp: 15 mL, Rfl: 2   Vitamin D, Ergocalciferol, (DRISDOL) 1.25 MG (50000 UNIT) CAPS capsule, Twice a week, Disp: 8 capsule, Rfl: 3   clotrimazole-betamethasone (LOTRISONE) cream, Apply topically 2 (two) times daily., Disp: 30 g, Rfl: 2  Past Medical History:  Diagnosis Date   Asthma    Calculus of gallbladder with chronic cholecystitis without obstruction 03/22/2017   Diabetes (HCC)    Epilepsy (HCC)    Hypertension    Sleep apnea    Objective:  PHYSICAL EXAM:   BP 102/60 (BP Location: Left Arm, Patient Position: Sitting, Cuff Size: Large)   Pulse 74   Temp 97.6 F (36.4 C)   Resp 16   Ht 5\' 6"  (1.676 m)   Wt 228 lb 9.6 oz (103.7 kg)   LMP  (LMP Unknown)   SpO2 100%   BMI 36.90 kg/m    GEN: Well nourished, well developed, in no acute distress  HEENT: normal external ears and nose - normal external auditory canals and TMS - hearing grossly normal - normal nasal mucosa and septum - Lips, Teeth and Gums - normal  Oropharynx - normal mucosa, palate, and posterior pharynx Neck: no JVD or masses - no thyromegaly Cardiac: RRR; no murmurs, rubs, or gallops,no edema - no significant  varicosities Respiratory:  normal respiratory rate and pattern with no distress - normal breath sounds with no rales, rhonchi, wheezes or rubs GI: normal bowel sounds, no masses or tenderness MS: no deformity or atrophy  Skin: blisters noted on lower legs Neuro:  Alert and Oriented x 3,- CN II-Xii grossly intact Psych: euthymic mood, appropriate affect and demeanor  Foot exam done -see screening Assessment & Plan:    Acute cystitis without hematuria -     POCT URINALYSIS DIP (CLINITEK) -     Urine Culture -     Doxycycline Hyclate; Take 1 tablet (100 mg total) by mouth 2 (two) times daily.  Dispense: 20 tablet; Refill: 0  Ulcers of both lower extremities, limited to breakdown of skin (HCC) Continue meds, elevation Bilateral leg edema -     Torsemide; Take 1 tablet (20 mg total) by mouth 2 (two) times daily.  Vitamin D deficiency -     VITAMIN D 25 Hydroxy (Vit-D Deficiency, Fractures)  Insulin dependent type 2 diabetes mellitus (HCC) -     CBC with Differential/Platelet -     Comprehensive metabolic panel -     TSH -     Lipid panel -     Hemoglobin A1c  Generalized epilepsy (HCC) Continue meds and follow up with neurology as directed Hyperlipidemia associated with type 2 diabetes mellitus (HCC) -     Comprehensive metabolic panel -     Lipid panel  Gastroesophageal reflux disease with esophagitis without hemorrhage -     Ambulatory referral to Gastroenterology  Gait instability -     Ambulatory referral to Physical Therapy  Other iron deficiency anemia -     CBC with Differential/Platelet -  Iron, TIBC and Ferritin Panel -     Ambulatory referral to Gastroenterology  Encounter for screening mammogram for breast cancer -     3D Screening Mammogram, Left and Right; Future  Other specified menopausal and perimenopausal disorders -     DG Bone Density; Future  Tinea corporis -     Clotrimazole-Betamethasone; Apply topically 2 (two) times daily.  Dispense: 30 g;  Refill: 2     Follow-up: Return in about 3 months (around 05/04/2023) for chronic fasting follow-up-- 40 min.  An After Visit Summary was printed and given to the patient.  Jettie Pagan [facility] [phone]

## 2023-02-03 ENCOUNTER — Other Ambulatory Visit: Payer: Self-pay | Admitting: Physician Assistant

## 2023-02-03 ENCOUNTER — Telehealth: Payer: Self-pay

## 2023-02-03 DIAGNOSIS — E119 Type 2 diabetes mellitus without complications: Secondary | ICD-10-CM

## 2023-02-03 DIAGNOSIS — N3000 Acute cystitis without hematuria: Secondary | ICD-10-CM

## 2023-02-03 DIAGNOSIS — R600 Localized edema: Secondary | ICD-10-CM

## 2023-02-03 DIAGNOSIS — E559 Vitamin D deficiency, unspecified: Secondary | ICD-10-CM

## 2023-02-03 DIAGNOSIS — T7840XD Allergy, unspecified, subsequent encounter: Secondary | ICD-10-CM

## 2023-02-03 DIAGNOSIS — I10 Essential (primary) hypertension: Secondary | ICD-10-CM

## 2023-02-03 DIAGNOSIS — K2100 Gastro-esophageal reflux disease with esophagitis, without bleeding: Secondary | ICD-10-CM

## 2023-02-03 DIAGNOSIS — E782 Mixed hyperlipidemia: Secondary | ICD-10-CM

## 2023-02-03 MED ORDER — LOSARTAN POTASSIUM-HCTZ 100-25 MG PO TABS
1.0000 | ORAL_TABLET | Freq: Every day | ORAL | 0 refills | Status: DC
Start: 2023-02-03 — End: 2023-05-06

## 2023-02-03 MED ORDER — TRESIBA FLEXTOUCH 200 UNIT/ML ~~LOC~~ SOPN: 40.0000 [IU] | PEN_INJECTOR | Freq: Every day | SUBCUTANEOUS | Status: DC

## 2023-02-03 MED ORDER — DOXYCYCLINE HYCLATE 100 MG PO TABS
100.0000 mg | ORAL_TABLET | Freq: Two times a day (BID) | ORAL | 0 refills | Status: DC
Start: 2023-02-03 — End: 2023-02-04

## 2023-02-03 MED ORDER — ATORVASTATIN CALCIUM 40 MG PO TABS
40.0000 mg | ORAL_TABLET | Freq: Every day | ORAL | 0 refills | Status: DC
Start: 2023-02-03 — End: 2023-05-06

## 2023-02-03 MED ORDER — MONTELUKAST SODIUM 10 MG PO TABS
10.0000 mg | ORAL_TABLET | Freq: Every day | ORAL | 0 refills | Status: DC
Start: 2023-02-03 — End: 2023-05-06

## 2023-02-03 MED ORDER — TRESIBA FLEXTOUCH 200 UNIT/ML ~~LOC~~ SOPN: 40.0000 [IU] | PEN_INJECTOR | Freq: Every day | SUBCUTANEOUS | 2 refills | Status: DC

## 2023-02-03 MED ORDER — TORSEMIDE 20 MG PO TABS
20.0000 mg | ORAL_TABLET | Freq: Two times a day (BID) | ORAL | 2 refills | Status: DC
Start: 2023-02-03 — End: 2023-04-05

## 2023-02-03 MED ORDER — VITAMIN D (ERGOCALCIFEROL) 1.25 MG (50000 UNIT) PO CAPS: ORAL_CAPSULE | ORAL | 3 refills | Status: DC

## 2023-02-03 MED ORDER — PANTOPRAZOLE SODIUM 40 MG PO TBEC
40.0000 mg | DELAYED_RELEASE_TABLET | Freq: Two times a day (BID) | ORAL | 0 refills | Status: DC
Start: 2023-02-03 — End: 2023-02-25

## 2023-02-03 MED ORDER — OZEMPIC (2 MG/DOSE) 8 MG/3ML ~~LOC~~ SOPN
2.0000 mg | PEN_INJECTOR | SUBCUTANEOUS | 2 refills | Status: DC
Start: 2023-02-03 — End: 2023-06-11

## 2023-02-03 NOTE — Telephone Encounter (Signed)
Please clarify this with patient --- is she switching pharmacies from now on? ---- is she completely out of all her medications and needs them all filled? (Because insurance will not fill meds if she is not due for them) What medications exactly does she need?

## 2023-02-03 NOTE — Telephone Encounter (Signed)
Patient called and stated that she needs all RX [facility] [place], [facility] has not sent over any of her medication and she need refills. Please sen medication to [facility] [place].

## 2023-02-04 ENCOUNTER — Other Ambulatory Visit: Payer: Self-pay | Admitting: Physician Assistant

## 2023-02-04 LAB — URINE CULTURE

## 2023-02-04 MED ORDER — CIPROFLOXACIN HCL 500 MG PO TABS
500.0000 mg | ORAL_TABLET | Freq: Two times a day (BID) | ORAL | 0 refills | Status: AC
Start: 2023-02-04 — End: 2023-02-14

## 2023-02-06 ENCOUNTER — Other Ambulatory Visit: Payer: Self-pay | Admitting: Physician Assistant

## 2023-02-07 ENCOUNTER — Encounter: Payer: Self-pay | Admitting: Oncology

## 2023-02-07 NOTE — Addendum Note (Signed)
Addended by: Domenic Schwab on: 02/07/2023 08:48 AM   Modules accepted: Orders

## 2023-02-08 ENCOUNTER — Inpatient Hospital Stay: Payer: Medicare Other

## 2023-02-08 ENCOUNTER — Other Ambulatory Visit: Payer: Self-pay

## 2023-02-08 VITALS — BP 136/72 | HR 90 | Temp 98.1°F | Resp 18 | Ht 66.0 in | Wt 211.0 lb

## 2023-02-08 DIAGNOSIS — D509 Iron deficiency anemia, unspecified: Secondary | ICD-10-CM | POA: Diagnosis not present

## 2023-02-08 MED ORDER — ONDANSETRON 4 MG PO TBDP: 4.0000 mg | ORAL_TABLET | Freq: Three times a day (TID) | ORAL | 0 refills | Status: DC | PRN

## 2023-02-08 MED ORDER — CYANOCOBALAMIN 1000 MCG/ML IJ SOLN
1000.0000 ug | Freq: Once | INTRAMUSCULAR | Status: AC
  Administered 2023-02-08: 1000 ug via INTRAMUSCULAR
  Filled 2023-02-08: qty 1

## 2023-02-08 NOTE — Patient Instructions (Signed)
Vitamin B12 Deficiency Vitamin B12 deficiency means that your body does not have enough vitamin B12. The body needs this important vitamin: To make red blood cells. To make genes (DNA). To help the nerves work. If you do not have enough vitamin B12 in your body, you can have health problems, such as not having enough red blood cells in the blood (anemia). What are the causes? Not eating enough foods that contain vitamin B12. Not being able to take in (absorb) vitamin B12 from the food that you eat. Certain diseases. A condition in which the body does not make enough of a certain protein. This results in your body not taking in enough vitamin B12. Having a surgery in which part of the stomach or small intestine is taken out. Taking medicines that make it hard for the body to take in vitamin B12. These include: Heartburn medicines. Some medicines that are used to treat diabetes. What increases the risk? Being an older adult. Eating a vegetarian or vegan diet that does not include any foods that come from animals. Not eating enough foods that contain vitamin B12 while you are pregnant. Taking certain medicines. Having alcoholism. What are the signs or symptoms? In some cases, there are no symptoms. If the condition leads to too few blood cells or nerve damage, symptoms can occur, such as: Feeling weak or tired. Not being hungry. Losing feeling (numbness) or tingling in your hands and feet. Redness and burning of the tongue. Feeling sad (depressed). Confusion or memory problems. Trouble walking. If anemia is very bad, symptoms can include: Being short of breath. Being dizzy. Having a very fast heartbeat. How is this treated? Changing the way you eat and drink, such as: Eating more foods that contain vitamin B12. Drinking little or no alcohol. Getting vitamin B12 shots. Taking vitamin B12 supplements by mouth (orally). Your doctor will tell you the dose that is best for you. Follow  these instructions at home: Eating and drinking  Eat foods that come from animals and have a lot of vitamin B12 in them. These include: Meats and poultry. This includes beef, pork, chicken, [place], and organ meats, such as liver. Seafood, such as clams, rainbow trout, salmon, tuna, and haddock. Eggs. Dairy foods such as milk, yogurt, and cheese. Eat breakfast cereals that have vitamin B12 added to them (are fortified). Check the label. The items listed above may not be a complete list of foods and beverages you can eat and drink. Contact a dietitian for more information. Alcohol use Do not drink alcohol if: Your doctor tells you not to drink. You are pregnant, may be pregnant, or are planning to become pregnant. If you drink alcohol: Limit how much you have to: 0-1 drink a day for women. 0-2 drinks a day for men. Know how much alcohol is in your drink. In the [place] one drink equals one 12 oz bottle of beer (355 mL), one 5 oz glass of wine (148 mL), or one 1 oz glass of hard liquor (44 mL). General instructions Get any vitamin B12 shots if told by your doctor. Take supplements only as told by your doctor. Follow the directions. Keep all follow-up visits. Contact a doctor if: Your symptoms come back. Your symptoms get worse or do not get better with treatment. Get help right away if: You have trouble breathing. You have a very fast heartbeat. You have chest pain. You get dizzy. You faint. These symptoms may be an emergency. Get help right away. Call 911.  Do not wait to see if the symptoms will go away. Do not drive yourself to the hospital. Summary Vitamin B12 deficiency means that your body is not getting enough of the vitamin. In some cases, there are no symptoms of this condition. Treatment may include making a change in the way you eat and drink, getting shots, or taking supplements. Eat foods that have vitamin B12 in them. This information is not intended to replace advice  given to you by your health care provider. Make sure you discuss any questions you have with your health care provider. Document Revised: 12/27/2020 Document Reviewed: 12/27/2020 Elsevier Patient Education  2024 [employer].

## 2023-02-21 DIAGNOSIS — L03115 Cellulitis of right lower limb: Secondary | ICD-10-CM | POA: Insufficient documentation

## 2023-02-21 DIAGNOSIS — I872 Venous insufficiency (chronic) (peripheral): Secondary | ICD-10-CM | POA: Insufficient documentation

## 2023-02-24 ENCOUNTER — Telehealth: Payer: Self-pay

## 2023-02-24 ENCOUNTER — Telehealth: Payer: Self-pay | Admitting: Physician Assistant

## 2023-02-24 NOTE — Telephone Encounter (Signed)
No ma'am

## 2023-02-24 NOTE — Telephone Encounter (Signed)
   Alicia Kelly has been scheduled for the following appointment:  WHAT: MAMMOGRAM AND BONE DENSITY WHERE: [facility] DATE: 03/26/2023 TIME: 1:45 PM CHECK-IN  Patient has been made aware.

## 2023-02-24 NOTE — Telephone Encounter (Signed)
?? -   will discuss at appt tomorrow --- was she needing or requesting anything in particular that needed addressed today?

## 2023-02-24 NOTE — Telephone Encounter (Signed)
Patient called and stated she has an appointment for hospital follow up tomorrow, but wanted the  provider to know she was recently seen in [facility] due to her legs swelling and leaking. Stated she is taking the fluid pills but they are making her nauseated and weak.

## 2023-02-25 ENCOUNTER — Telehealth: Payer: Self-pay | Admitting: Physician Assistant

## 2023-02-25 ENCOUNTER — Ambulatory Visit (INDEPENDENT_AMBULATORY_CARE_PROVIDER_SITE_OTHER): Payer: Medicare Other | Admitting: Physician Assistant

## 2023-02-25 ENCOUNTER — Encounter: Payer: Self-pay | Admitting: Physician Assistant

## 2023-02-25 VITALS — BP 116/70 | HR 77 | Temp 97.5°F | Ht 66.0 in | Wt 234.0 lb

## 2023-02-25 DIAGNOSIS — L97921 Non-pressure chronic ulcer of unspecified part of left lower leg limited to breakdown of skin: Secondary | ICD-10-CM

## 2023-02-25 DIAGNOSIS — L97911 Non-pressure chronic ulcer of unspecified part of right lower leg limited to breakdown of skin: Secondary | ICD-10-CM

## 2023-02-25 DIAGNOSIS — R350 Frequency of micturition: Secondary | ICD-10-CM

## 2023-02-25 DIAGNOSIS — K2100 Gastro-esophageal reflux disease with esophagitis, without bleeding: Secondary | ICD-10-CM | POA: Diagnosis not present

## 2023-02-25 DIAGNOSIS — R600 Localized edema: Secondary | ICD-10-CM

## 2023-02-25 LAB — POCT URINALYSIS DIP (CLINITEK)
Bilirubin, UA: NEGATIVE
Blood, UA: NEGATIVE
Glucose, UA: 500 mg/dL — AB
Ketones, POC UA: NEGATIVE mg/dL
Leukocytes, UA: NEGATIVE
Nitrite, UA: NEGATIVE
POC PROTEIN,UA: NEGATIVE
Spec Grav, UA: 1.015 (ref 1.010–1.025)
Urobilinogen, UA: 0.2 U/dL
pH, UA: 6 (ref 5.0–8.0)

## 2023-02-25 MED ORDER — PANTOPRAZOLE SODIUM 40 MG PO TBEC: 40.0000 mg | DELAYED_RELEASE_TABLET | Freq: Two times a day (BID) | ORAL | 1 refills | Status: DC

## 2023-02-25 NOTE — Progress Notes (Signed)
Subjective:  Patient ID: Alicia Kelly, female    DOB: [birth year]  Age: 67 y.o. MRN: [id number]  Chief Complaint  Patient presents with   Edema    Legs    HPI Follow up Hospitalization  Patient was admitted to [facility] on 10/6 and discharged on 10/7. She was treated for stasis dermatitis and edema. Treatment for this included lasix and wound therapy.  She reports good compliance with treatment. She reports this condition is improved.   Pt went to ED and was at first diagnosed with cellulitis (which she did not end up receiving antibiotics for) and for edema and stasis dermatitis - she did have venous ultrasound and did not have DVT Pt had been taking demadex 20mg  bid but then stopped the med last week because she thought it was making her feel bad (despite she has been on this medication awhile) and after several days the swelling in her lower legs became severe She was treated with IV lasix at the hospital and was discharged only with magnesium supplement for 10 days She did note that she had gotten rx again for demadex but only the 10mg  twice daily dosing Pt has had some urine frequency and would like to check ua (told her this is probably due to restarting demadex)  She is having home wound therapy come out twice weekly as well    02/02/2023   10:45 AM 10/28/2022   10:41 AM 07/07/2022   10:31 AM 06/18/2022   10:05 AM 07/03/2020   10:06 AM  Depression screen PHQ 2/9  Decreased Interest 0 0 0 0 0  Down, Depressed, Hopeless 0 0 0 0 0  PHQ - 2 Score 0 0 0 0 0  Altered sleeping 0      Tired, decreased energy 0      Change in appetite 0      Feeling bad or failure about yourself  0      Trouble concentrating 0      Moving slowly or fidgety/restless 0      Suicidal thoughts 0      PHQ-9 Score 0            05/28/2022    9:28 AM 06/18/2022   10:04 AM 07/07/2022   10:35 AM 10/28/2022   10:41 AM 02/02/2023   10:44 AM  Fall Risk  Falls in the past year?  0 0 1 1   Was there an injury with Fall?  0 0 1 1  Fall Risk Category Calculator  0 0 2 3  (RETIRED) Patient Fall Risk Level Low fall risk      Patient at Risk for Falls Due to  No Fall Risks No Fall Risks History of fall(s);No Fall Risks   Fall risk Follow up   Falls evaluation completed;Education provided Falls evaluation completed Falls evaluation completed     ROS CONSTITUTIONAL: Negative for chills, fatigue, fever, unintentional weight gain and unintentional weight loss.  E/N/T: Negative for ear pain, nasal congestion and sore throat.  CARDIOVASCULAR: Negative for chest pain, dizziness, palpitations - has chronic pedal/leg edema RESPIRATORY: Negative for recent cough and dyspnea.  GASTROINTESTINAL: Negative for abdominal pain, acid reflux symptoms, constipation, diarrhea, nausea and vomiting.  MSK: Negative for arthralgias and myalgias.  INTEGUMENTARY: see HPI NEUROLOGICAL: Negative for dizziness and headaches.  PSYCHIATRIC: Negative for sleep disturbance and to question depression screen.  Negative for depression, negative for anhedonia.    Current Outpatient Medications:    Accu-Chek Softclix  Lancets lancets, TEST BLOOD SUGAR THREE TIMES DAILY, Disp: 300 each, Rfl: 1   atorvastatin (LIPITOR) 40 MG tablet, Take 1 tablet (40 mg total) by mouth daily., Disp: 90 tablet, Rfl: 0   Blood Glucose Calibration (ACCU-CHEK AVIVA) SOLN, , Disp: , Rfl:    Blood Glucose Monitoring Suppl (ACCU-CHEK AVIVA PLUS) w/Device KIT, 1 each by Does not apply route 3 (three) times daily., Disp: 1 kit, Rfl: 0   clotrimazole-betamethasone (LOTRISONE) cream, Apply topically 2 (two) times daily., Disp: 30 g, Rfl: 2   fluticasone (FLONASE) 50 MCG/ACT nasal spray, Place 2 sprays into both nostrils daily., Disp: , Rfl:    folic acid (FOLVITE) 1 MG tablet, Take 1 mg by mouth daily., Disp: , Rfl:    glucose blood (ACCU-CHEK GUIDE) test strip, Check sugars threes times daily, Disp: 300 each, Rfl: 3   insulin degludec (TRESIBA  FLEXTOUCH) 200 UNIT/ML FlexTouch Pen, Inject 40 Units into the skin daily., Disp: 3 mL, Rfl: 2   Insulin Pen Needle (B-D UF III MINI PEN NEEDLES) 31G X 5 MM MISC, USE 1 PEN NEEDLE DAILY, Disp: 100 each, Rfl: 2   lacosamide (VIMPAT) 200 MG TABS tablet, Take 1.5 tablets (300 mg total) by mouth 2 (two) times daily., Disp: 180 tablet, Rfl: 2   levETIRAcetam (KEPPRA) 1000 MG tablet, Take 1 tablet (1,000 mg total) by mouth in the morning, at noon, and at bedtime., Disp: 90 tablet, Rfl: 6   losartan-hydrochlorothiazide (HYZAAR) 100-25 MG tablet, Take 1 tablet by mouth daily., Disp: 90 tablet, Rfl: 0   magnesium oxide (MAG-OX) 400 MG tablet, Take by mouth., Disp: , Rfl:    meclizine (ANTIVERT) 25 MG tablet, Take 25 mg by mouth every 6 (six) hours as needed for dizziness., Disp: , Rfl:    montelukast (SINGULAIR) 10 MG tablet, Take 1 tablet (10 mg total) by mouth daily. as directed, Disp: 90 tablet, Rfl: 0   mupirocin ointment (BACTROBAN) 2 %, Apply 1 Application topically 2 (two) times daily., Disp: 22 g, Rfl: 0   ondansetron (ZOFRAN-ODT) 4 MG disintegrating tablet, Take 1 tablet (4 mg total) by mouth every 8 (eight) hours as needed for nausea or vomiting., Disp: 90 tablet, Rfl: 0   SANTYL 250 UNIT/GM ointment, Apply topically., Disp: , Rfl:    Semaglutide, 2 MG/DOSE, (OZEMPIC, 2 MG/DOSE,) 8 MG/3ML SOPN, Inject 2 mg into the skin once a week., Disp: 3 mL, Rfl: 2   torsemide (DEMADEX) 20 MG tablet, Take 1 tablet (20 mg total) by mouth 2 (two) times daily., Disp: 60 tablet, Rfl: 2   Vitamin D, Ergocalciferol, (DRISDOL) 1.25 MG (50000 UNIT) CAPS capsule, Twice a week, Disp: 8 capsule, Rfl: 3   pantoprazole (PROTONIX) 40 MG tablet, Take 1 tablet (40 mg total) by mouth 2 (two) times daily., Disp: 180 tablet, Rfl: 1  Past Medical History:  Diagnosis Date   Asthma    Calculus of gallbladder with chronic cholecystitis without obstruction 03/22/2017   Diabetes (HCC)    Epilepsy (HCC)    Hypertension    Sleep  apnea    Objective:  PHYSICAL EXAM:   BP 116/70 (BP Location: Left Arm, Patient Position: Sitting, Cuff Size: Large)   Pulse 77   Temp (!) 97.5 F (36.4 C) (Temporal)   Ht 5\' 6"  (1.676 m)   Wt 234 lb (106.1 kg)   LMP  (LMP Unknown)   SpO2 98%   BMI 37.77 kg/m    GEN: Well nourished, well developed, in no acute distress  Cardiac: RRR; no murmurs, rubs, or gallops,no edema -  Respiratory:  normal respiratory rate and pattern with no distress - normal breath sounds with no rales, rhonchi, wheezes or rubs  MS: no deformity or atrophy  Skin: stasis dermatitis with blisters noted on both lower legs Neuro:  Alert and Oriented x 3, Strength and sensation are intact - CN II-Xii grossly intact Psych: euthymic mood, appropriate affect and demeanor  Office Visit on 02/25/2023  Component Date Value Ref Range Status   Color, UA 02/25/2023 yellow  yellow Final   Clarity, UA 02/25/2023 clear  clear Final   Glucose, UA 02/25/2023 =500 (A)  negative mg/dL Final   Bilirubin, UA 40/34/7425 negative  negative Final   Ketones, POC UA 02/25/2023 negative  negative mg/dL Final   Spec Grav, UA 95/63/8756 1.015  1.010 - 1.025 Final   Blood, UA 02/25/2023 negative  negative Final   pH, UA 02/25/2023 6.0  5.0 - 8.0 Final   POC PROTEIN,UA 02/25/2023 negative  negative, trace Final   Urobilinogen, UA 02/25/2023 0.2  0.2 or 1.0 E.U./dL Final   Nitrite, UA 43/32/9518 Negative  Negative Final   Leukocytes, UA 02/25/2023 Negative  Negative Final    Assessment & Plan:    Frequency of urination -     POCT URINALYSIS DIP (CLINITEK)  Bilateral leg edema -     CBC with Differential/Platelet -     Comprehensive metabolic panel -     Magnesium  Ulcers of both lower extremities, limited to breakdown of skin (HCC) -     CBC with Differential/Platelet -     Comprehensive metabolic panel  Gastroesophageal reflux disease with esophagitis without hemorrhage -     Pantoprazole Sodium; Take 1 tablet (40 mg  total) by mouth 2 (two) times daily.  Dispense: 180 tablet; Refill: 1     Follow-up: Return for for next chronic scheduled visit.  An After Visit Summary was printed and given to the patient.  Jettie Pagan [facility] [phone]

## 2023-02-25 NOTE — Telephone Encounter (Signed)
[facility] - Client Coordination Note Report - 02/24/23

## 2023-02-26 LAB — CBC WITH DIFFERENTIAL/PLATELET
Basophils Absolute: 0.1 10*3/uL (ref 0.0–0.2)
Basos: 1 %
EOS (ABSOLUTE): 0.4 10*3/uL (ref 0.0–0.4)
Eos: 4 %
Hematocrit: 33.3 % — ABNORMAL LOW (ref 34.0–46.6)
Hemoglobin: 10.4 g/dL — ABNORMAL LOW (ref 11.1–15.9)
Immature Grans (Abs): 0 10*3/uL (ref 0.0–0.1)
Immature Granulocytes: 0 %
Lymphocytes Absolute: 1.4 10*3/uL (ref 0.7–3.1)
Lymphs: 12 %
MCH: 29.7 pg (ref 26.6–33.0)
MCHC: 31.2 g/dL — ABNORMAL LOW (ref 31.5–35.7)
MCV: 95 fL (ref 79–97)
Monocytes Absolute: 0.8 10*3/uL (ref 0.1–0.9)
Monocytes: 8 %
Neutrophils Absolute: 8.4 10*3/uL — ABNORMAL HIGH (ref 1.4–7.0)
Neutrophils: 75 %
Platelets: 483 10*3/uL — ABNORMAL HIGH (ref 150–450)
RBC: 3.5 x10E6/uL — ABNORMAL LOW (ref 3.77–5.28)
RDW: 12.6 % (ref 11.7–15.4)
WBC: 11.1 10*3/uL — ABNORMAL HIGH (ref 3.4–10.8)

## 2023-02-26 LAB — COMPREHENSIVE METABOLIC PANEL
ALT: 13 [IU]/L (ref 0–32)
AST: 14 [IU]/L (ref 0–40)
Albumin: 3.9 g/dL (ref 3.9–4.9)
Alkaline Phosphatase: 82 [IU]/L (ref 44–121)
BUN/Creatinine Ratio: 30 — ABNORMAL HIGH (ref 12–28)
BUN: 22 mg/dL (ref 8–27)
Bilirubin Total: 0.2 mg/dL (ref 0.0–1.2)
CO2: 24 mmol/L (ref 20–29)
Calcium: 9.9 mg/dL (ref 8.7–10.3)
Chloride: 99 mmol/L (ref 96–106)
Creatinine, Ser: 0.73 mg/dL (ref 0.57–1.00)
Globulin, Total: 2.3 g/dL (ref 1.5–4.5)
Glucose: 251 mg/dL — ABNORMAL HIGH (ref 70–99)
Potassium: 4.5 mmol/L (ref 3.5–5.2)
Sodium: 139 mmol/L (ref 134–144)
Total Protein: 6.2 g/dL (ref 6.0–8.5)
eGFR: 91 mL/min/{1.73_m2} (ref >59)

## 2023-02-26 LAB — MAGNESIUM: Magnesium: 2.1 mg/dL (ref 1.6–2.3)

## 2023-03-11 ENCOUNTER — Inpatient Hospital Stay: Payer: Medicare Other | Attending: Oncology

## 2023-03-11 ENCOUNTER — Telehealth: Payer: Self-pay

## 2023-03-11 VITALS — BP 129/68 | HR 85 | Temp 97.9°F | Resp 20 | Ht 66.0 in | Wt 212.0 lb

## 2023-03-11 DIAGNOSIS — E538 Deficiency of other specified B group vitamins: Secondary | ICD-10-CM | POA: Diagnosis present

## 2023-03-11 DIAGNOSIS — D509 Iron deficiency anemia, unspecified: Secondary | ICD-10-CM

## 2023-03-11 MED ORDER — CYANOCOBALAMIN 1000 MCG/ML IJ SOLN
1000.0000 ug | Freq: Once | INTRAMUSCULAR | Status: AC
  Administered 2023-03-11: 1000 ug via INTRAMUSCULAR
  Filled 2023-03-11: qty 1

## 2023-03-11 NOTE — Patient Instructions (Signed)
 Vitamin B12 Injection What is this medication? Vitamin B12 (VAHY tuh min B12) prevents and treats low vitamin B12 levels in your body. It is used in people who do not get enough vitamin B12 from their diet or when their digestive tract does not absorb enough. Vitamin B12 plays an important role in maintaining the health of your nervous system and red blood cells. This medicine may be used for other purposes; ask your health care provider or pharmacist if you have questions. COMMON BRAND NAME(S): B-12 Compliance Kit, B-12 Injection Kit, Cyomin, Dodex, LA-12, Nutri-Twelve, Physicians EZ Use B-12, Primabalt, Vitamin Deficiency Injectable System - B12 What should I tell my care team before I take this medication? They need to know if you have any of these conditions: Kidney disease Leber's disease Megaloblastic anemia An unusual or allergic reaction to cyanocobalamin, cobalt, other medications, foods, dyes, or preservatives Pregnant or trying to get pregnant Breast-feeding How should I use this medication? This medication is injected into a muscle or deeply under the skin. It is usually given in a clinic or care team's office. However, your care team may teach you how to inject yourself. Follow all instructions. Talk to your care team about the use of this medication in children. Special care may be needed. Overdosage: If you think you have taken too much of this medicine contact a poison control center or emergency room at once. NOTE: This medicine is only for you. Do not share this medicine with others. What if I miss a dose? If you are given your dose at a clinic or care team's office, call to reschedule your appointment. If you give your own injections, and you miss a dose, take it as soon as you can. If it is almost time for your next dose, take only that dose. Do not take double or extra doses. What may interact with this medication? Alcohol Colchicine This list may not describe all possible  interactions. Give your health care provider a list of all the medicines, herbs, non-prescription drugs, or dietary supplements you use. Also tell them if you smoke, drink alcohol, or use illegal drugs. Some items may interact with your medicine. What should I watch for while using this medication? Visit your care team regularly. You may need blood work done while you are taking this medication. You may need to follow a special diet. Talk to your care team. Limit your alcohol intake and avoid smoking to get the best benefit. What side effects may I notice from receiving this medication? Side effects that you should report to your care team as soon as possible: Allergic reactions--skin rash, itching, hives, swelling of the face, lips, tongue, or throat Swelling of the ankles, hands, or feet Trouble breathing Side effects that usually do not require medical attention (report to your care team if they continue or are bothersome): Diarrhea This list may not describe all possible side effects. Call your doctor for medical advice about side effects. You may report side effects to FDA at 1-800-FDA-1088. Where should I keep my medication? Keep out of the reach of children. Store at room temperature between 15 and 30 degrees C (59 and 85 degrees F). Protect from light. Throw away any unused medication after the expiration date. NOTE: This sheet is a summary. It may not cover all possible information. If you have questions about this medicine, talk to your doctor, pharmacist, or health care provider.  2024 Elsevier/Gold Standard (2021-01-14 00:00:00)

## 2023-03-11 NOTE — Telephone Encounter (Signed)
[facility]-standard written order

## 2023-03-12 ENCOUNTER — Telehealth: Payer: Self-pay | Admitting: Physician Assistant

## 2023-03-12 NOTE — Telephone Encounter (Signed)
[facility] -Client Coordination Note Report - 03/11/23 - Physician verbal order fax request

## 2023-03-25 ENCOUNTER — Telehealth: Payer: Self-pay | Admitting: Physician Assistant

## 2023-03-25 ENCOUNTER — Other Ambulatory Visit: Payer: Self-pay

## 2023-03-25 DIAGNOSIS — E119 Type 2 diabetes mellitus without complications: Secondary | ICD-10-CM

## 2023-03-25 MED ORDER — BD PEN NEEDLE MINI U/F 31G X 5 MM MISC: 2 refills | Status: DC

## 2023-03-25 NOTE — Telephone Encounter (Signed)
Prescription Request  03/25/2023  LOV: 02/25/2023  What is the name of the medication or equipment? Insulin Pen Needle (B-D UF III MINI PEN NEEDLES) 31G X 5 MM MISC [37706]     Which pharmacy would you like this sent to?  [id number] - [place], [place] - [address] [place] [address] Phone: [phone] Fax: [phone]    Patient notified that their request is being sent to the clinical staff for review and that they should receive a response within 2 business days.   Please advise at Mobile [phone] (mobile)

## 2023-04-02 ENCOUNTER — Other Ambulatory Visit: Payer: Self-pay | Admitting: Physician Assistant

## 2023-04-02 DIAGNOSIS — E559 Vitamin D deficiency, unspecified: Secondary | ICD-10-CM

## 2023-04-02 DIAGNOSIS — R600 Localized edema: Secondary | ICD-10-CM

## 2023-04-02 DIAGNOSIS — E1169 Type 2 diabetes mellitus with other specified complication: Secondary | ICD-10-CM

## 2023-04-05 ENCOUNTER — Other Ambulatory Visit: Payer: Self-pay | Admitting: Physician Assistant

## 2023-04-05 DIAGNOSIS — R600 Localized edema: Secondary | ICD-10-CM

## 2023-04-05 MED ORDER — TORSEMIDE 20 MG PO TABS: 20.0000 mg | ORAL_TABLET | Freq: Two times a day (BID) | ORAL | 2 refills | Status: DC

## 2023-04-06 ENCOUNTER — Telehealth: Payer: Self-pay

## 2023-04-06 NOTE — Telephone Encounter (Signed)
Verbal given to Nurse, Called patient and spoke with her advise her to cut back on sodium intake to help her swelling in legs. She stated that when she does cut back she starts to not feeling good and than when she eats sodium again she feels better, so she knows her sodium is low. I advise her to cut back and when she starts felling bad call office and make an appointment so labs can be taking so provider can see if it is her sodium and maybe treat it another way instead of her doing it from what she eats to help prevent her legs from swelling and possible infection due to the sores she get when her legs are swollen.  Patient Verbalized understanding.

## 2023-04-06 NOTE — Telephone Encounter (Signed)
Patient told them that her doctor told her she needs to eat as much sodium as she can due to her sodium being low, they are concern with her sodium intake and may be some of the cause of her legs swelling. Also wanted a verbal for  2x a week, for the last 2/3 weeks instead of once a week for Home health for her legs.

## 2023-04-06 NOTE — Telephone Encounter (Signed)
You can give home health a verbal order for the treatment -- I personally have not told her to eat as much salt as possible so unsure which provider she is talking about (perhaps from the times she has been in hospital? )

## 2023-04-19 ENCOUNTER — Telehealth: Payer: Self-pay | Admitting: Physician Assistant

## 2023-04-19 NOTE — Telephone Encounter (Signed)
[facility] Certification and Plan of Care - order # [id number]

## 2023-04-19 NOTE — Telephone Encounter (Signed)
[facility] [phone]

## 2023-04-27 NOTE — Progress Notes (Unsigned)
Cardiology Office Note:    Date:  04/28/2023   ID:  Alicia Kelly, [place] Meo [birth year], MRN [id number]  PCP:  Marianne Sofia, PA-C  Cardiologist:  Norman Herrlich, MD    Referring MD: Marianne Sofia, PA-C    ASSESSMENT:    1. Swelling of both lower extremities   2. Hypertensive heart disease, unspecified whether heart failure present   3. Mixed hyperlipidemia   4. Type 2 diabetes mellitus without complication, without long-term current use of insulin (HCC)    PLAN:    In order of problems listed above:  Improved with conservative measures chronic venous insufficiency does not have heart failure Well-controlled continue treatment including thiazide diuretic ARB and a particularly avoid calcium channel blockers LDL at target continue her high intensity statin with type 2 diabetes On good therapy including semaglutide if another agent was needed and SGLT2 inhibitor would be beneficial   Next appointment: I will plan to see as needed   Medication Adjustments/Labs and Tests Ordered: Current medicines are reviewed at length with the patient today.  Concerns regarding medicines are outlined above.  Orders Placed This Encounter  Procedures   EKG 12-Lead   No orders of the defined types were placed in this encounter.    History of Present Illness:    Alicia Kelly is a 67 y.o. female with a hx of costochondral pain syndrome type 2 diabetes hyperlipidemia and coronary calcification on CT scan iron deficiency and lower extremity edema last seen 08/19/2022.  She had an echocardiogram reported 09/01/2022.  Left ventricle normal in size wall thickness normal systolic and diastolic EF 60 to 65% with normal right ventricular size function and no valvular abnormality.  proBNP level was very low at 27 excluding heart failure  Compliance with diet, lifestyle and medications: Yes  She is improved she is doing all the right things leg wraps elevation restricting salt in physical effort and  her edema is diminished and is tolerable She is not having shortness of breath orthopnea chest pain palpitation or syncope She takes no medications favoring edema She is on semaglutide for diabetes which I think is favorable  She is quite relieved by the results of her laboratory test and echocardiogram Past Medical History:  Diagnosis Date   Asthma    Calculus of gallbladder with chronic cholecystitis without obstruction 03/22/2017   Diabetes (HCC)    Epilepsy (HCC)    Hypertension    Sleep apnea     Current Medications: Current Meds  Medication Sig   Accu-Chek Softclix Lancets lancets TEST BLOOD SUGAR THREE TIMES DAILY   atorvastatin (LIPITOR) 40 MG tablet Take 1 tablet (40 mg total) by mouth daily.   Blood Glucose Calibration (ACCU-CHEK AVIVA) SOLN    Blood Glucose Monitoring Suppl (ACCU-CHEK AVIVA PLUS) w/Device KIT 1 each by Does not apply route 3 (three) times daily.   clotrimazole-betamethasone (LOTRISONE) cream Apply topically 2 (two) times daily.   fluticasone (FLONASE) 50 MCG/ACT nasal spray Place 2 sprays into both nostrils daily.   folic acid (FOLVITE) 1 MG tablet Take 1 mg by mouth daily.   glucose blood (ACCU-CHEK GUIDE) test strip Check sugars threes times daily   Insulin Pen Needle (B-D UF III MINI PEN NEEDLES) 31G X 5 MM MISC USE 1 PEN NEEDLE DAILY   lacosamide (VIMPAT) 200 MG TABS tablet Take 1.5 tablets (300 mg total) by mouth 2 (two) times daily.   levETIRAcetam (KEPPRA) 1000 MG tablet Take 1 tablet (1,000 mg total) by mouth in the  morning, at noon, and at bedtime.   losartan-hydrochlorothiazide (HYZAAR) 100-25 MG tablet Take 1 tablet by mouth daily.   meclizine (ANTIVERT) 25 MG tablet Take 25 mg by mouth every 6 (six) hours as needed for dizziness.   montelukast (SINGULAIR) 10 MG tablet Take 1 tablet (10 mg total) by mouth daily. as directed   mupirocin ointment (BACTROBAN) 2 % Apply 1 Application topically 2 (two) times daily.   ondansetron (ZOFRAN-ODT) 4 MG  disintegrating tablet TAKE 1 TABLET BY MOUTH EVERY 8 HOURS AS NEEDED FOR NAUSEA AND VOMITING   pantoprazole (PROTONIX) 40 MG tablet Take 1 tablet (40 mg total) by mouth 2 (two) times daily.   SANTYL 250 UNIT/GM ointment Apply topically.   Semaglutide, 2 MG/DOSE, (OZEMPIC, 2 MG/DOSE,) 8 MG/3ML SOPN Inject 2 mg into the skin once a week.   torsemide (DEMADEX) 20 MG tablet Take 1 tablet (20 mg total) by mouth 2 (two) times daily.   TRESIBA FLEXTOUCH 100 UNIT/ML FlexTouch Pen INJECT 35 UNITS SUBCUTANEOUSLY DAILY   Vitamin D, Ergocalciferol, (DRISDOL) 1.25 MG (50000 UNIT) CAPS capsule TAKE 1 CAPSULE BY MOUTH TWICE A WEEK      EKGs/Labs/Other Studies Reviewed:    The following studies were reviewed today:  Cardiac Studies & Procedures     STRESS TESTS  MYOCARDIAL PERFUSION IMAGING 02/26/2021   ECHOCARDIOGRAM  ECHOCARDIOGRAM COMPLETE 09/01/2022  Narrative ECHOCARDIOGRAM REPORT    Patient Name:   Alicia Kelly Date of Exam: 09/01/2022 Medical Rec #:  [id number]          Height:       66.0 in Accession #:    [id number]         Weight:       239.0 lb Date of Birth:  10/14/1955          BSA:          2.157 m Patient Age:    66 years           BP:           120/74 mmHg Patient Gender: F                  HR:           73 bpm. Exam Location:  [place]  Procedure: 2D Echo, Cardiac Doppler, Color Doppler and Strain Analysis  Indications:    Hypertensive heart disease, unspecified whether heart failure present [I11.9 (ICD-10-CM)]; Swelling of both lower extremities [M79.89 (ICD-10-CM)]  History:        Patient has no prior history of Echocardiogram examinations. CHF, CAD; Risk Factors:Dyslipidemia.  Sonographer:    Margreta Journey RDCS Referring Phys: [id number] Breya Cass J Chenell Lozon  IMPRESSIONS   1. Left ventricular ejection fraction, by estimation, is 60 to 65%. The left ventricle has normal function. The left ventricle has no regional wall motion abnormalities. Left ventricular  diastolic parameters were normal. 2. Right ventricular systolic function is normal. The right ventricular size is normal. 3. The mitral valve is normal in structure. No evidence of mitral valve regurgitation. No evidence of mitral stenosis. 4. The aortic valve is normal in structure. Aortic valve regurgitation is not visualized. No aortic stenosis is present. 5. The inferior vena cava is normal in size with greater than 50% respiratory variability, suggesting right atrial pressure of 3 mmHg.  FINDINGS Left Ventricle: Left ventricular ejection fraction, by estimation, is 60 to 65%. The left ventricle has normal function. The left ventricle has no regional wall motion abnormalities. The left  ventricular internal cavity size was normal in size. There is no left ventricular hypertrophy. Left ventricular diastolic parameters were normal.  Right Ventricle: The right ventricular size is normal. No increase in right ventricular wall thickness. Right ventricular systolic function is normal.  Left Atrium: Left atrial size was normal in size.  Right Atrium: Right atrial size was normal in size.  Pericardium: There is no evidence of pericardial effusion.  Mitral Valve: The mitral valve is normal in structure. No evidence of mitral valve regurgitation. No evidence of mitral valve stenosis.  Tricuspid Valve: The tricuspid valve is normal in structure. Tricuspid valve regurgitation is not demonstrated. No evidence of tricuspid stenosis.  Aortic Valve: The aortic valve is normal in structure. Aortic valve regurgitation is not visualized. No aortic stenosis is present.  Pulmonic Valve: The pulmonic valve was normal in structure. Pulmonic valve regurgitation is not visualized. No evidence of pulmonic stenosis.  Aorta: The aortic root is normal in size and structure.  Venous: The inferior vena cava is normal in size with greater than 50% respiratory variability, suggesting right atrial pressure of 3  mmHg.  IAS/Shunts: No atrial level shunt detected by color flow Doppler.   LEFT VENTRICLE PLAX 2D LVIDd:         5.30 cm   Diastology LVIDs:         3.20 cm   LV e' medial:    10.19 cm/s LV PW:         0.80 cm   LV E/e' medial:  9.2 LV IVS:        0.80 cm   LV e' lateral:   13.10 cm/s LVOT diam:     2.10 cm   LV E/e' lateral: 7.2 LV SV:         79 LV SV Index:   36        2D Longitudinal Strain LVOT Area:     3.46 cm  2D Strain GLS Avg:     -20.4 %   RIGHT VENTRICLE             IVC RV Basal diam:  3.20 cm     IVC diam: 2.00 cm RV Mid diam:    2.50 cm RV S prime:     15.20 cm/s TAPSE (M-mode): 3.1 cm  LEFT ATRIUM             Index        RIGHT ATRIUM           Index LA diam:        4.10 cm 1.90 cm/m   RA Area:     14.20 cm LA Vol (A2C):   52.7 ml 24.43 ml/m  RA Volume:   31.10 ml  14.42 ml/m LA Vol (A4C):   49.9 ml 23.13 ml/m LA Biplane Vol: 54.3 ml 25.17 ml/m AORTIC VALVE LVOT Vmax:   111.67 cm/s LVOT Vmean:  72.767 cm/s LVOT VTI:    0.227 m  AORTA Ao Root diam: 3.50 cm Ao Asc diam:  3.20 cm Ao Desc diam: 1.80 cm  MITRAL VALVE MV Area (PHT): 3.51 cm    SHUNTS MV Decel Time: 216 msec    Systemic VTI:  0.23 m MV E velocity: 94.20 cm/s  Systemic Diam: 2.10 cm MV A velocity: 87.30 cm/s MV E/A ratio:  1.08  Gypsy Balsam MD Electronically signed by Gypsy Balsam MD Signature Date/Time: 09/01/2022/12:48:21 PM    Final  EKG Interpretation Date/Time:  Wednesday April 28 2023 08:27:36 EST Ventricular Rate:  70 PR Interval:  202 QRS Duration:  100 QT Interval:  386 QTC Calculation: 416 R Axis:   -25  Text Interpretation: Normal sinus rhythm Normal ECG No previous ECGs available Confirmed by Norman Herrlich ([id number]) on 04/28/2023 8:33:00 AM   Recent Labs: 06/18/2022: NT-Pro BNP 38 02/02/2023: TSH 1.180 02/25/2023: ALT 13; BUN 22; Creatinine, Ser 0.73; Hemoglobin 10.4; Magnesium 2.1; Platelets 483; Potassium 4.5; Sodium 139  Recent Lipid  Panel    Component Value Date/Time   CHOL 117 02/02/2023 1146   TRIG 173 (H) 02/02/2023 1146   HDL 44 02/02/2023 1146   CHOLHDL 2.7 02/02/2023 1146   LDLCALC 44 02/02/2023 1146    Physical Exam:    VS:  BP 122/72   Pulse 70   Ht 5\' 4"  (1.626 m)   Wt 212 lb 3.2 oz (96.3 kg)   LMP  (LMP Unknown)   SpO2 98%   BMI 36.42 kg/m     Wt Readings from Last 3 Encounters:  04/28/23 212 lb 3.2 oz (96.3 kg)  03/11/23 212 lb (96.2 kg)  02/25/23 234 lb (106.1 kg)     GEN:  Well nourished, well developed in no acute distress HEENT: Normal NECK: No JVD; No carotid bruits LYMPHATICS: No lymphadenopathy CARDIAC: RRR, no murmurs, rubs, gallops RESPIRATORY:  Clear to auscultation without rales, wheezing or rhonchi  ABDOMEN: Soft, non-tender, non-distended MUSCULOSKELETAL: Decreased but still present bilateral lower extremity pitting edema; No deformity  SKIN: Warm and dry NEUROLOGIC:  Alert and oriented x 3 PSYCHIATRIC:  Normal affect    Signed, Norman Herrlich, MD  04/28/2023 8:47 AM    [facility]

## 2023-04-28 ENCOUNTER — Ambulatory Visit: Payer: Medicare Other | Attending: Cardiology | Admitting: Cardiology

## 2023-04-28 ENCOUNTER — Encounter: Payer: Self-pay | Admitting: Cardiology

## 2023-04-28 VITALS — BP 122/72 | HR 70 | Ht 64.0 in | Wt 212.2 lb

## 2023-04-28 DIAGNOSIS — I119 Hypertensive heart disease without heart failure: Secondary | ICD-10-CM | POA: Diagnosis not present

## 2023-04-28 DIAGNOSIS — M7989 Other specified soft tissue disorders: Secondary | ICD-10-CM | POA: Diagnosis not present

## 2023-04-28 DIAGNOSIS — E119 Type 2 diabetes mellitus without complications: Secondary | ICD-10-CM

## 2023-04-28 DIAGNOSIS — E782 Mixed hyperlipidemia: Secondary | ICD-10-CM | POA: Diagnosis not present

## 2023-04-28 NOTE — Patient Instructions (Signed)
Medication Instructions:  Your physician recommends that you continue on your current medications as directed. Please refer to the Current Medication list given to you today.  *If you need a refill on your cardiac medications before your next appointment, please call your pharmacy*   Lab Work: NONE If you have labs (blood work) drawn today and your tests are completely normal, you will receive your results only by: MyChart Message (if you have MyChart) OR A paper copy in the mail If you have any lab test that is abnormal or we need to change your treatment, we will call you to review the results.   Testing/Procedures: NONE   Follow-Up: At [facility], you and your health needs are our priority.  As part of our continuing mission to provide you with exceptional heart care, we have created designated Provider Care Teams.  These Care Teams include your primary Cardiologist (physician) and Advanced Practice Providers (APPs -  Physician Assistants and Nurse Practitioners) who all work together to provide you with the care you need, when you need it.  We recommend signing up for the patient portal called "MyChart".  Sign up information is provided on this After Visit Summary.  MyChart is used to connect with patients for Virtual Visits (Telemedicine).  Patients are able to view lab/test results, encounter notes, upcoming appointments, etc.  Non-urgent messages can be sent to your provider as well.   To learn more about what you can do with MyChart, go to [email or web address].    Your next appointment:  Call or return to clinic prn if these symptoms worsen or fail to improve as anticipated.     Provider:   Norman Herrlich, MD    Other Instructions

## 2023-05-06 ENCOUNTER — Ambulatory Visit (INDEPENDENT_AMBULATORY_CARE_PROVIDER_SITE_OTHER): Payer: Medicare Other | Admitting: Physician Assistant

## 2023-05-06 ENCOUNTER — Encounter: Payer: Self-pay | Admitting: Physician Assistant

## 2023-05-06 VITALS — BP 128/72 | HR 70 | Temp 97.8°F | Ht 64.0 in | Wt 212.0 lb

## 2023-05-06 DIAGNOSIS — R600 Localized edema: Secondary | ICD-10-CM | POA: Diagnosis not present

## 2023-05-06 DIAGNOSIS — G40309 Generalized idiopathic epilepsy and epileptic syndromes, not intractable, without status epilepticus: Secondary | ICD-10-CM

## 2023-05-06 DIAGNOSIS — Z1231 Encounter for screening mammogram for malignant neoplasm of breast: Secondary | ICD-10-CM

## 2023-05-06 DIAGNOSIS — E1169 Type 2 diabetes mellitus with other specified complication: Secondary | ICD-10-CM

## 2023-05-06 DIAGNOSIS — I10 Essential (primary) hypertension: Secondary | ICD-10-CM

## 2023-05-06 DIAGNOSIS — L97921 Non-pressure chronic ulcer of unspecified part of left lower leg limited to breakdown of skin: Secondary | ICD-10-CM

## 2023-05-06 DIAGNOSIS — K2100 Gastro-esophageal reflux disease with esophagitis, without bleeding: Secondary | ICD-10-CM

## 2023-05-06 DIAGNOSIS — E559 Vitamin D deficiency, unspecified: Secondary | ICD-10-CM

## 2023-05-06 DIAGNOSIS — N958 Other specified menopausal and perimenopausal disorders: Secondary | ICD-10-CM

## 2023-05-06 DIAGNOSIS — L97911 Non-pressure chronic ulcer of unspecified part of right lower leg limited to breakdown of skin: Secondary | ICD-10-CM

## 2023-05-06 DIAGNOSIS — R350 Frequency of micturition: Secondary | ICD-10-CM

## 2023-05-06 DIAGNOSIS — E611 Iron deficiency: Secondary | ICD-10-CM

## 2023-05-06 DIAGNOSIS — E782 Mixed hyperlipidemia: Secondary | ICD-10-CM

## 2023-05-06 DIAGNOSIS — E119 Type 2 diabetes mellitus without complications: Secondary | ICD-10-CM

## 2023-05-06 LAB — POCT URINALYSIS DIP (CLINITEK)
Bilirubin, UA: NEGATIVE
Blood, UA: NEGATIVE
Glucose, UA: NEGATIVE mg/dL
Ketones, POC UA: NEGATIVE mg/dL
Leukocytes, UA: NEGATIVE
Nitrite, UA: NEGATIVE
POC PROTEIN,UA: NEGATIVE
Spec Grav, UA: 1.015 (ref 1.010–1.025)
Urobilinogen, UA: NEGATIVE U/dL
pH, UA: 6 (ref 5.0–8.0)

## 2023-05-06 MED ORDER — PANTOPRAZOLE SODIUM 40 MG PO TBEC: 40.0000 mg | DELAYED_RELEASE_TABLET | Freq: Two times a day (BID) | ORAL | 1 refills | Status: DC

## 2023-05-06 MED ORDER — ATORVASTATIN CALCIUM 40 MG PO TABS: 40.0000 mg | ORAL_TABLET | Freq: Every day | ORAL | 0 refills | Status: DC

## 2023-05-06 MED ORDER — TORSEMIDE 10 MG PO TABS: 10.0000 mg | ORAL_TABLET | Freq: Two times a day (BID) | ORAL | Status: DC

## 2023-05-06 NOTE — Progress Notes (Signed)
Subjective:  Patient ID: Alicia Kelly, female    DOB: 16-Jul-1955  Age: 67 y.o. MRN: [id number]  Chief Complaint  Patient presents with   Follow-up - chronic   Urinary frequency  Lurline HAS BEEN OUT OF [facility], [facility] FOR THE PAST 3 MONTHS - [facility]PITE THE RX WAS SENT SHE ALSO IS TAKING DEMADEX 10MG  BID INSTEAD OF 20MG  BID (WAS CONFUSED ABOUT DOSING)  HPI Alicia Kelly has a history of type 2 Diabetes Mellitus for more than 5 years. Current treatment includes ozempic 2mg  weekly and tresiba 35 units qd -   She denies hypoglycemic episodes. She states blood glucose levels at home range from 140s to 200s. She  is consuming a heart healthy diet and exercising regularly. She is overdue for eye exam  Mixed hyperlipidemia  Pt presents with hyperlipidemia. She is due for labwork - the pharmacy did not fill her medication since last visit so she has been off lipitor - will send in rx again  Pt with complaints of urinary urgency for the past few days and would like ua checked  Pt with diagnosis of generalized epilepsy - currently following with neurology and has appt due within the next month Taking vimpat and keppra  Pt presents for follow up of hypertension.  Patient actually has been on losartan for the past 3 months because her pharmacy did not fill her medication and she did not call to ask for med to be sent in  - she states her bp has been fine and she denies chest pain/dyspnea  Pt with GERD - she has been having breakthrough gastritis symptoms but we also figured out pharmacy has not filled her protonix in the past 3 months and realized she has not been taking --- will send in med refill but she actually has appt with GI in the next few weeks also  She also has been diagnosed with iron def anemia and has received iron infusions - following with Dr Melvyn Neth  Pt with Vit D def - is taking weekly supplement - due for labwork  Pt is due for mammogram and dexa scan -  would like to schedule  Pt continues to have swelling of lower legs with mild weeping of open lesions - she was supposed to be taking demadex 20mg  bid but she was confused about dosing and states for the past several months she has been taking 10mg  bid Overall states legs have been doing well - currently following with [facility] for wound management and has compression bandages on both legs  Lab Results  Component Value Date   HGBA1C 8.4 (H) 02/02/2023   HGBA1C 8.4 (H) 10/28/2022   HGBA1C 8.4 (H) 06/18/2022        02/02/2023   10:45 AM 10/28/2022   10:41 AM 07/07/2022   10:31 AM 06/18/2022   10:05 AM 07/03/2020   10:06 AM  Depression screen PHQ 2/9  Decreased Interest 0 0 0 0 0  Down, Depressed, Hopeless 0 0 0 0 0  PHQ - 2 Score 0 0 0 0 0  Altered sleeping 0      Tired, decreased energy 0      Change in appetite 0      Feeling bad or failure about yourself  0      Trouble concentrating 0      Moving slowly or fidgety/restless 0      Suicidal thoughts 0  PHQ-9 Score 0            05/28/2022    9:28 AM 06/18/2022   10:04 AM 07/07/2022   10:35 AM 10/28/2022   10:41 AM 02/02/2023   10:44 AM  Fall Risk  Falls in the past year?  0 0 1 1  Was there an injury with Fall?  0 0 1 1  Fall Risk Category Calculator  0 0 2 3  (RETIRED) Patient Fall Risk Level Low fall risk      Patient at Risk for Falls Due to  No Fall Risks No Fall Risks History of fall(s);No Fall Risks   Fall risk Follow up   Falls evaluation completed;Education provided Falls evaluation completed Falls evaluation completed    CONSTITUTIONAL: Negative for chills, fatigue, fever, unintentional weight gain and unintentional weight loss.  E/N/T: Negative for ear pain, nasal congestion and sore throat.  CARDIOVASCULAR: Negative for chest pain, dizziness, palpitations - does have edema RESPIRATORY: Negative for recent cough and dyspnea.  GASTROINTESTINAL: see HPI MSK: Negative for arthralgias and myalgias.   INTEGUMENTARY: Negative for rash.  NEUROLOGICAL: Negative for dizziness and headaches.  PSYCHIATRIC: Negative for sleep disturbance and to question depression screen.  Negative for depression, negative for anhedonia.        Current Outpatient Medications:    Accu-Chek Softclix Lancets lancets, TEST BLOOD SUGAR THREE TIMES DAILY, Disp: 300 each, Rfl: 1   Blood Glucose Calibration (ACCU-CHEK AVIVA) SOLN, , Disp: , Rfl:    Blood Glucose Monitoring Suppl (ACCU-CHEK AVIVA PLUS) w/Device KIT, 1 each by Does not apply route 3 (three) times daily., Disp: 1 kit, Rfl: 0   fluticasone (FLONASE) 50 MCG/ACT nasal spray, Place 2 sprays into both nostrils daily., Disp: , Rfl:    glucose blood (ACCU-CHEK GUIDE) test strip, Check sugars threes times daily, Disp: 300 each, Rfl: 3   Insulin Pen Needle (B-D UF III MINI PEN NEEDLES) 31G X 5 MM MISC, USE 1 PEN NEEDLE DAILY, Disp: 100 each, Rfl: 2   lacosamide (VIMPAT) 200 MG TABS tablet, Take 1.5 tablets (300 mg total) by mouth 2 (two) times daily. (Patient taking differently: Take 200 mg by mouth 2 (two) times daily.), Disp: 180 tablet, Rfl: 2   levETIRAcetam (KEPPRA) 1000 MG tablet, Take 1 tablet (1,000 mg total) by mouth in the morning, at noon, and at bedtime., Disp: 90 tablet, Rfl: 6   Semaglutide, 2 MG/DOSE, (OZEMPIC, 2 MG/DOSE,) 8 MG/3ML SOPN, Inject 2 mg into the skin once a week., Disp: 3 mL, Rfl: 2   torsemide (DEMADEX) 10 MG tablet, Take 1 tablet (10 mg total) by mouth 2 (two) times daily., Disp: , Rfl:    TRESIBA FLEXTOUCH 100 UNIT/ML FlexTouch Pen, INJECT 35 UNITS SUBCUTANEOUSLY DAILY, Disp: 15 mL, Rfl: 2   Vitamin D, Ergocalciferol, (DRISDOL) 1.25 MG (50000 UNIT) CAPS capsule, TAKE 1 CAPSULE BY MOUTH TWICE A WEEK, Disp: 24 capsule, Rfl: 1   atorvastatin (LIPITOR) 40 MG tablet, Take 1 tablet (40 mg total) by mouth daily., Disp: 90 tablet, Rfl: 0   pantoprazole (PROTONIX) 40 MG tablet, Take 1 tablet (40 mg total) by mouth 2 (two) times daily., Disp: 180  tablet, Rfl: 1  Past Medical History:  Diagnosis Date   Asthma    Calculus of gallbladder with chronic cholecystitis without obstruction 03/22/2017   Diabetes (HCC)    Epilepsy (HCC)    Hypertension    Sleep apnea    Objective:  PHYSICAL EXAM:   VS: BP 128/72 (BP  Location: Left Arm, Patient Position: Sitting)   Pulse 70   Temp 97.8 F (36.6 C) (Temporal)   Ht 5\' 4"  (1.626 m)   Wt 212 lb (96.2 kg)   LMP  (LMP Unknown)   SpO2 98%   BMI 36.39 kg/m   GEN: Well nourished, well developed, in no acute distress   Cardiac: RRR; no murmurs, rubs, or gallops,- legs are wrapped with compressive dressings Respiratory:  normal respiratory rate and pattern with no distress - normal breath sounds with no rales, rhonchi, wheezes or rubs MS: no deformity or atrophy  Skin: warm and dry, no rash  Neuro:  Alert and Oriented x 3,  CN II-Xii grossly intact Psych: euthymic mood, appropriate affect and demeanor  Office Visit on 05/06/2023  Component Date Value Ref Range Status   Glucose, UA 05/06/2023 negative  negative mg/dL Final   Bilirubin, UA 16/02/9603 negative  negative Final   Ketones, POC UA 05/06/2023 negative  negative mg/dL Final   Spec Grav, UA 54/01/8118 1.015  1.010 - 1.025 Final   Blood, UA 05/06/2023 negative  negative Final   pH, UA 05/06/2023 6.0  5.0 - 8.0 Final   POC PROTEIN,UA 05/06/2023 negative  negative, trace Final   Urobilinogen, UA 05/06/2023 negative  0.2 or 1.0 E.U./dL Final   Nitrite, UA 14/78/2956 Negative  Negative Final   Leukocytes, UA 05/06/2023 Negative  Negative Final    Assessment & Plan:    Ulcers of both lower extremities, limited to breakdown of skin (HCC) Continue meds, elevation Continue therapy with [facility] Bilateral leg edema -     Torsemide 20mg  1/2 po bid  Vitamin D deficiency -     VITAMIN D 25 Hydroxy (Vit-D Deficiency, Fractures) Continue supplement twice weekly Insulin dependent type 2 diabetes mellitus (HCC) -     CBC  with Differential/Platelet -     Comprehensive metabolic panel -     TSH -     Lipid panel -     Hemoglobin A1c Continue meds Generalized epilepsy (HCC) Continue meds and follow up with neurology as directed Hyperlipidemia associated with type 2 diabetes mellitus (HCC) -     Comprehensive metabolic panel -     Lipid panel Restart lipitor Gastroesophageal reflux disease with esophagitis without hemorrhage -     Ambulatory referral to Gastroenterology Restart protonix  Other iron deficiency anemia -     CBC with Differential/Platelet -     Iron, TIBC and Ferritin Panel -    follow up with GI as scheduled  Encounter for screening mammogram for breast cancer -     3D Screening Mammogram, Left and Right; Future  Other specified menopausal and perimenopausal disorders -     DG Bone Density; Future       Follow-up: Return in about 3 months (around 08/04/2023) for chronic fasting follow-up.  An After Visit Summary was printed and given to the patient.  Jettie Pagan [facility] [phone]

## 2023-05-07 ENCOUNTER — Other Ambulatory Visit: Payer: Self-pay | Admitting: Physician Assistant

## 2023-05-07 DIAGNOSIS — E119 Type 2 diabetes mellitus without complications: Secondary | ICD-10-CM

## 2023-05-07 LAB — IRON,TIBC AND FERRITIN PANEL
Ferritin: 42 ng/mL (ref 15–150)
Iron Saturation: 17 % (ref 15–55)
Iron: 55 ug/dL (ref 27–139)
Total Iron Binding Capacity: 323 ug/dL (ref 250–450)
UIBC: 268 ug/dL (ref 118–369)

## 2023-05-07 LAB — COMPREHENSIVE METABOLIC PANEL
ALT: 11 [IU]/L (ref 0–32)
AST: 13 [IU]/L (ref 0–40)
Albumin: 3.8 g/dL — ABNORMAL LOW (ref 3.9–4.9)
Alkaline Phosphatase: 86 [IU]/L (ref 44–121)
BUN/Creatinine Ratio: 24 (ref 12–28)
BUN: 18 mg/dL (ref 8–27)
Bilirubin Total: 0.3 mg/dL (ref 0.0–1.2)
CO2: 22 mmol/L (ref 20–29)
Calcium: 9.4 mg/dL (ref 8.7–10.3)
Chloride: 98 mmol/L (ref 96–106)
Creatinine, Ser: 0.75 mg/dL (ref 0.57–1.00)
Globulin, Total: 2.3 g/dL (ref 1.5–4.5)
Glucose: 217 mg/dL — ABNORMAL HIGH (ref 70–99)
Potassium: 4.4 mmol/L (ref 3.5–5.2)
Sodium: 137 mmol/L (ref 134–144)
Total Protein: 6.1 g/dL (ref 6.0–8.5)
eGFR: 88 mL/min/{1.73_m2} (ref >59)

## 2023-05-07 LAB — CBC WITH DIFFERENTIAL/PLATELET
Basophils Absolute: 0 10*3/uL (ref 0.0–0.2)
Basos: 0 %
EOS (ABSOLUTE): 0.3 10*3/uL (ref 0.0–0.4)
Eos: 3 %
Hematocrit: 34.8 % (ref 34.0–46.6)
Hemoglobin: 11 g/dL — ABNORMAL LOW (ref 11.1–15.9)
Immature Grans (Abs): 0 10*3/uL (ref 0.0–0.1)
Immature Granulocytes: 0 %
Lymphocytes Absolute: 1.4 10*3/uL (ref 0.7–3.1)
Lymphs: 13 %
MCH: 29.4 pg (ref 26.6–33.0)
MCHC: 31.6 g/dL (ref 31.5–35.7)
MCV: 93 fL (ref 79–97)
Monocytes Absolute: 0.7 10*3/uL (ref 0.1–0.9)
Monocytes: 7 %
Neutrophils Absolute: 8 10*3/uL — ABNORMAL HIGH (ref 1.4–7.0)
Neutrophils: 77 %
Platelets: 532 10*3/uL — ABNORMAL HIGH (ref 150–450)
RBC: 3.74 x10E6/uL — ABNORMAL LOW (ref 3.77–5.28)
RDW: 12.8 % (ref 11.7–15.4)
WBC: 10.4 10*3/uL (ref 3.4–10.8)

## 2023-05-07 LAB — TSH: TSH: 1.21 u[IU]/mL (ref 0.450–4.500)

## 2023-05-07 LAB — LIPID PANEL
Chol/HDL Ratio: 2.5 {ratio} (ref 0.0–4.4)
Cholesterol, Total: 135 mg/dL (ref 100–199)
HDL: 53 mg/dL (ref >39)
LDL Chol Calc (NIH): 56 mg/dL (ref 0–99)
Triglycerides: 155 mg/dL — ABNORMAL HIGH (ref 0–149)
VLDL Cholesterol Cal: 26 mg/dL (ref 5–40)

## 2023-05-07 LAB — HEMOGLOBIN A1C
Est. average glucose Bld gHb Est-mCnc: 226 mg/dL
Hgb A1c MFr Bld: 9.5 % — ABNORMAL HIGH (ref 4.8–5.6)

## 2023-05-07 LAB — VITAMIN D 25 HYDROXY (VIT D DEFICIENCY, FRACTURES): Vit D, 25-Hydroxy: 67.8 ng/mL (ref 30.0–100.0)

## 2023-05-07 MED ORDER — DAPAGLIFLOZIN PROPANEDIOL 5 MG PO TABS: 5.0000 mg | ORAL_TABLET | Freq: Every day | ORAL | 2 refills | Status: DC

## 2023-05-20 ENCOUNTER — Encounter: Payer: Self-pay | Admitting: Physician Assistant

## 2023-05-28 ENCOUNTER — Ambulatory Visit: Payer: Medicare Other

## 2023-06-01 ENCOUNTER — Telehealth: Payer: Self-pay | Admitting: Family Medicine

## 2023-06-01 NOTE — Telephone Encounter (Signed)
 [facility] - PLAN OF CARE

## 2023-06-03 DIAGNOSIS — D508 Other iron deficiency anemias: Secondary | ICD-10-CM

## 2023-06-03 DIAGNOSIS — E1169 Type 2 diabetes mellitus with other specified complication: Secondary | ICD-10-CM

## 2023-06-03 DIAGNOSIS — L97829 Non-pressure chronic ulcer of other part of left lower leg with unspecified severity: Secondary | ICD-10-CM | POA: Diagnosis not present

## 2023-06-03 DIAGNOSIS — G40309 Generalized idiopathic epilepsy and epileptic syndromes, not intractable, without status epilepticus: Secondary | ICD-10-CM

## 2023-06-03 DIAGNOSIS — K219 Gastro-esophageal reflux disease without esophagitis: Secondary | ICD-10-CM

## 2023-06-03 DIAGNOSIS — E782 Mixed hyperlipidemia: Secondary | ICD-10-CM

## 2023-06-03 DIAGNOSIS — I10 Essential (primary) hypertension: Secondary | ICD-10-CM

## 2023-06-03 DIAGNOSIS — Z794 Long term (current) use of insulin: Secondary | ICD-10-CM

## 2023-06-03 DIAGNOSIS — I872 Venous insufficiency (chronic) (peripheral): Secondary | ICD-10-CM | POA: Diagnosis not present

## 2023-06-03 DIAGNOSIS — E559 Vitamin D deficiency, unspecified: Secondary | ICD-10-CM

## 2023-06-03 DIAGNOSIS — L89323 Pressure ulcer of left buttock, stage 3: Secondary | ICD-10-CM | POA: Diagnosis not present

## 2023-06-03 DIAGNOSIS — E1151 Type 2 diabetes mellitus with diabetic peripheral angiopathy without gangrene: Secondary | ICD-10-CM | POA: Diagnosis not present

## 2023-06-09 ENCOUNTER — Other Ambulatory Visit: Payer: Self-pay | Admitting: Physician Assistant

## 2023-06-09 DIAGNOSIS — E119 Type 2 diabetes mellitus without complications: Secondary | ICD-10-CM

## 2023-06-09 DIAGNOSIS — R600 Localized edema: Secondary | ICD-10-CM

## 2023-06-09 DIAGNOSIS — B354 Tinea corporis: Secondary | ICD-10-CM

## 2023-06-09 DIAGNOSIS — E782 Mixed hyperlipidemia: Secondary | ICD-10-CM

## 2023-06-11 ENCOUNTER — Other Ambulatory Visit: Payer: Self-pay | Admitting: Physician Assistant

## 2023-06-11 DIAGNOSIS — R600 Localized edema: Secondary | ICD-10-CM

## 2023-06-11 MED ORDER — TORSEMIDE 10 MG PO TABS: 10.0000 mg | ORAL_TABLET | Freq: Two times a day (BID) | ORAL | 1 refills | Status: DC

## 2023-06-14 ENCOUNTER — Telehealth: Payer: Self-pay

## 2023-06-14 NOTE — Telephone Encounter (Signed)
[facility]: CONTINUED NEED OF HOSPITAL BED. USE AND BENEFIT. INS CHANGE

## 2023-06-15 ENCOUNTER — Telehealth: Payer: Self-pay

## 2023-06-15 ENCOUNTER — Other Ambulatory Visit: Payer: Self-pay

## 2023-06-15 DIAGNOSIS — E119 Type 2 diabetes mellitus without complications: Secondary | ICD-10-CM

## 2023-06-15 MED ORDER — ACCU-CHEK GUIDE TEST VI STRP: ORAL_STRIP | 12 refills | Status: DC

## 2023-06-15 NOTE — Telephone Encounter (Signed)
Copied from CRM [id number]. Topic: Clinical - Prescription Issue >> Jun 15, 2023 10:14 AM Eunice Blase wrote: Reason for CRM: [facility] due to waiting on PCP approval. Pt does not know which medications. Please call pt and pharmacy.

## 2023-06-15 NOTE — Telephone Encounter (Signed)
[facility] [place], they stated they don't see that the patient need any refills, but they do have Ondansetron and torsemide ready for her.  Called patient and made her aware of information. I had patient to go over her medication with me and she didn't need any refills at the moment. I made her aware if she is low of something to keep bottles and call office and make [place] aware she needs refill.

## 2023-06-22 ENCOUNTER — Telehealth: Payer: Self-pay

## 2023-06-22 ENCOUNTER — Other Ambulatory Visit: Payer: Self-pay | Admitting: Physician Assistant

## 2023-06-22 DIAGNOSIS — R600 Localized edema: Secondary | ICD-10-CM

## 2023-06-22 DIAGNOSIS — L97921 Non-pressure chronic ulcer of unspecified part of left lower leg limited to breakdown of skin: Secondary | ICD-10-CM

## 2023-06-22 NOTE — Telephone Encounter (Signed)
 Referral made

## 2023-06-22 NOTE — Telephone Encounter (Signed)
It is ok to give them a verbal ok to continue treatment

## 2023-06-22 NOTE — Telephone Encounter (Signed)
 Called starla from [facility] and she stated that they will need a new refferal sent in due to patient has missed her recertifcation date due to patient having other appointments so they will have to do a whole new start of care for wound of her left buttock and unna boot for bilateral lower extremities

## 2023-06-22 NOTE — Telephone Encounter (Signed)
A referral order is needed for Home Health.

## 2023-06-22 NOTE — Telephone Encounter (Signed)
 Copied from CRM [id number]. Topic: Referral - Question >> Jun 22, 2023  9:56 AM Delon DASEN wrote: Reason for CRM: Starla with [facility], need new referral for home health, patient still needs wound care and unnaboots for leg ulcers and buttock wound, please call [phone]

## 2023-06-23 ENCOUNTER — Telehealth: Payer: Self-pay | Admitting: Physician Assistant

## 2023-06-23 NOTE — Telephone Encounter (Signed)
 [facility]-PLAN OF CARE-ORDER #[id number]

## 2023-06-24 ENCOUNTER — Telehealth: Payer: Self-pay

## 2023-06-24 NOTE — Telephone Encounter (Signed)
 Order #: [id number]

## 2023-06-28 DIAGNOSIS — L89892 Pressure ulcer of other site, stage 2: Secondary | ICD-10-CM

## 2023-06-28 DIAGNOSIS — E782 Mixed hyperlipidemia: Secondary | ICD-10-CM

## 2023-06-28 DIAGNOSIS — L97819 Non-pressure chronic ulcer of other part of right lower leg with unspecified severity: Secondary | ICD-10-CM

## 2023-06-28 DIAGNOSIS — L89323 Pressure ulcer of left buttock, stage 3: Secondary | ICD-10-CM

## 2023-06-28 DIAGNOSIS — E1169 Type 2 diabetes mellitus with other specified complication: Secondary | ICD-10-CM

## 2023-06-28 DIAGNOSIS — E669 Obesity, unspecified: Secondary | ICD-10-CM

## 2023-06-28 DIAGNOSIS — I872 Venous insufficiency (chronic) (peripheral): Secondary | ICD-10-CM

## 2023-06-28 DIAGNOSIS — G40309 Generalized idiopathic epilepsy and epileptic syndromes, not intractable, without status epilepticus: Secondary | ICD-10-CM

## 2023-06-28 DIAGNOSIS — Z4800 Encounter for change or removal of nonsurgical wound dressing: Secondary | ICD-10-CM

## 2023-06-28 DIAGNOSIS — L97822 Non-pressure chronic ulcer of other part of left lower leg with fat layer exposed: Secondary | ICD-10-CM

## 2023-06-28 DIAGNOSIS — Z794 Long term (current) use of insulin: Secondary | ICD-10-CM

## 2023-06-28 DIAGNOSIS — E1151 Type 2 diabetes mellitus with diabetic peripheral angiopathy without gangrene: Secondary | ICD-10-CM | POA: Diagnosis not present

## 2023-07-05 ENCOUNTER — Other Ambulatory Visit: Payer: Self-pay | Admitting: Physician Assistant

## 2023-07-06 ENCOUNTER — Other Ambulatory Visit: Payer: Self-pay

## 2023-07-06 ENCOUNTER — Telehealth: Payer: Self-pay

## 2023-07-06 NOTE — Telephone Encounter (Signed)
Yes with her last labwork in December farxiga 5mg  qd was added to help control diabetes better I do not know exactly what other medications she needs from pharmacy - we have sent in all requests Let us know exactly what she does not have or is having trouble getting and will send in

## 2023-07-06 NOTE — Telephone Encounter (Signed)
Copied from CRM [id number]. Topic: Clinical - Prescription Issue >> Jul 06, 2023  1:38 PM Fredrica W wrote: Reason for CRM: Patient called states she picked up medication and they gave her dapagliflozin propanediol (FARXIGA) 5 MG TABS tablet but she does not think it was prescribed and wants to know if its something she should be taking. Also states [facility] not [facility] she requested, even after Kennon Rounds reached out to them. States Kennon Rounds has a list. Thank You

## 2023-07-07 ENCOUNTER — Other Ambulatory Visit: Payer: Self-pay | Admitting: Physician Assistant

## 2023-07-07 DIAGNOSIS — R600 Localized edema: Secondary | ICD-10-CM

## 2023-07-08 ENCOUNTER — Encounter: Payer: Self-pay | Admitting: Oncology

## 2023-07-08 ENCOUNTER — Ambulatory Visit: Payer: Medicare Other

## 2023-07-08 VITALS — BP 118/68 | HR 70 | Ht 66.0 in | Wt 214.0 lb

## 2023-07-08 DIAGNOSIS — N959 Unspecified menopausal and perimenopausal disorder: Secondary | ICD-10-CM

## 2023-07-08 DIAGNOSIS — Z0000 Encounter for general adult medical examination without abnormal findings: Secondary | ICD-10-CM | POA: Diagnosis not present

## 2023-07-08 DIAGNOSIS — Z1231 Encounter for screening mammogram for malignant neoplasm of breast: Secondary | ICD-10-CM

## 2023-07-08 NOTE — Progress Notes (Signed)
Subjective:   Alicia Kelly is a 68 y.o. female who presents for Medicare Annual (Subsequent) preventive examination.  This wellness visit is conducted by a nurse.  The patient's medications were reviewed and reconciled since the patient's last visit.  History details were provided by the patient.  The history appears to be reliable.    Medical History: Patient history and Family history was reviewed  Medications, Allergies, and preventative health maintenance was reviewed and updated.   Visit Complete: Virtual I connected with  Alicia Kelly on 07/08/23 by a audio enabled telemedicine application and verified that I am speaking with the correct person using two identifiers.  Patient Location: Home  Provider Location: Office/Clinic  I discussed the limitations of evaluation and management by telemedicine. The patient expressed understanding and agreed to proceed.  Vital Signs: Because this visit was a virtual/telehealth visit, some criteria may be missing or patient reported. Any vitals not documented were not able to be obtained and vitals that have been documented are patient reported.  Cardiac Risk Factors include: obesity (BMI >30kg/m2);diabetes mellitus;advanced age (>52men, >62 women)     Objective:    Today's Vitals   07/08/23 1418  BP: 118/68  Pulse: 70  Weight: 214 lb (97.1 kg)  Height: 5\' 6"  (1.676 m)  PainSc: 0-No pain  Vitals were self reported Body mass index is 34.54 kg/m.     02/08/2023   10:09 AM 01/06/2023    2:05 PM 12/28/2022   10:48 AM 12/07/2022   10:15 AM 11/05/2022   10:05 AM 10/29/2022   10:02 AM 10/15/2022   10:12 AM  Advanced Directives  Does Patient Have a Medical Advance Directive? No No No No No No No  Would patient like information on creating a medical advance directive? No - Patient declined   No - Patient declined No - Patient declined No - Patient declined No - Patient declined    Current Medications (verified) Outpatient  Encounter Medications as of 07/08/2023  Medication Sig   Accu-Chek Softclix Lancets lancets TEST BLOOD SUGAR THREE TIMES DAILY   atorvastatin (LIPITOR) 40 MG tablet TAKE 1 TABLET BY MOUTH EVERY DAY   Blood Glucose Calibration (ACCU-CHEK AVIVA) SOLN    Blood Glucose Monitoring Suppl (ACCU-CHEK AVIVA PLUS) w/Device KIT 1 each by Does not apply route 3 (three) times daily.   clotrimazole-betamethasone (LOTRISONE) cream APPLY TOPICALLY TWICE A DAY   dapagliflozin propanediol (FARXIGA) 5 MG TABS tablet Take 1 tablet (5 mg total) by mouth daily before breakfast.   fluticasone (FLONASE) 50 MCG/ACT nasal spray Place 2 sprays into both nostrils daily.   glucose blood (ACCU-CHEK GUIDE TEST) test strip Use as instructed   Insulin Pen Needle (B-D UF III MINI PEN NEEDLES) 31G X 5 MM MISC USE 1 PEN NEEDLE DAILY   lacosamide (VIMPAT) 200 MG TABS tablet Take 1.5 tablets (300 mg total) by mouth 2 (two) times daily. (Patient taking differently: Take 200 mg by mouth 2 (two) times daily.)   levETIRAcetam (KEPPRA) 1000 MG tablet Take 1 tablet (1,000 mg total) by mouth in the morning, at noon, and at bedtime.   ondansetron (ZOFRAN-ODT) 4 MG disintegrating tablet TAKE 1 TABLET BY MOUTH EVERY 8 HOURS AS NEEDED FOR NAUSEA AND VOMITING   pantoprazole (PROTONIX) 40 MG tablet Take 1 tablet (40 mg total) by mouth 2 (two) times daily.   Semaglutide, 2 MG/DOSE, (OZEMPIC, 2 MG/DOSE,) 8 MG/3ML SOPN INJECT 2 MG INTO THE SKIN ONCE A WEEK.   torsemide (DEMADEX) 10  MG tablet TAKE 1 TABLET BY MOUTH TWICE A DAY   TRESIBA FLEXTOUCH 100 UNIT/ML FlexTouch Pen INJECT 35 UNITS SUBCUTANEOUSLY DAILY   Vitamin D, Ergocalciferol, (DRISDOL) 1.25 MG (50000 UNIT) CAPS capsule TAKE 1 CAPSULE BY MOUTH TWICE A WEEK   No facility-administered encounter medications on file as of 07/08/2023.    Allergies (verified) Feraheme [ferumoxytol], Levofloxacin, Lisinopril, Nitrofurantoin, Penicillins, Clarithromycin, and Sulfamethoxazole-trimethoprim    History: Past Medical History:  Diagnosis Date   Asthma    Calculus of gallbladder with chronic cholecystitis without obstruction 03/22/2017   Diabetes (HCC)    Epilepsy (HCC)    Hypertension    Sleep apnea    Past Surgical History:  Procedure Laterality Date   APPENDECTOMY     CHOLECYSTECTOMY     TONSILLECTOMY     Family History  Problem Relation Age of Onset   Colon cancer Mother        d. 36   Hypertension Father    Diabetes Father    Allergic rhinitis Sister    Hypertension Sister    Cancer - Colon Maternal Aunt    Cancer Maternal Uncle    Colon cancer Maternal Grandmother        dx 37s   Colon cancer Paternal Grandmother        dx 51s   Diabetes Paternal Grandmother    Breast cancer Neg Hx    Social History   Socioeconomic History   Marital status: Widowed    Spouse name: Not on file   Number of children: 0   Years of education: Not on file   Highest education level: 10th grade  Occupational History   Occupation: Disabled  Tobacco Use   Smoking status: Never   Smokeless tobacco: Never  Vaping Use   Vaping status: Never Used  Substance and Sexual Activity   Alcohol use: Never   Drug use: Never   Sexual activity: Not Currently  Other Topics Concern   Not on file  Social History Narrative   Spouse passed away 10 yrs ago -- no children   Social Drivers of Corporate investment banker Strain: Low Risk  (07/08/2023)   Overall Financial Resource Strain (CARDIA)    Difficulty of Paying Living Expenses: Not hard at all  Food Insecurity: No Food Insecurity (07/08/2023)   Hunger Vital Sign    Worried About Running Out of Food in the Last Year: Never true    Ran Out of Food in the Last Year: Never true  Transportation Needs: No Transportation Needs (07/08/2023)   PRAPARE - Administrator, Civil Service (Medical): No    Lack of Transportation (Non-Medical): No  Physical Activity: Sufficiently Active (07/08/2023)   Exercise Vital Sign    Days of  Exercise per Week: 5 days    Minutes of Exercise per Session: 30 min  Stress: No Stress Concern Present (07/08/2023)   [employer] of Occupational Health - Occupational Stress Questionnaire    Feeling of Stress : Not at all  Social Connections: Socially Isolated (07/08/2023)   Social Connection and Isolation Panel [NHANES]    Frequency of Communication with Friends and Family: More than three times a week    Frequency of Social Gatherings with Friends and Family: Once a week    Attends Religious Services: Never    Database administrator or Organizations: No    Attends Banker Meetings: Never    Marital Status: Widowed    Tobacco Counseling Counseling given: Not  Answered   Clinical Intake:  Pre-visit preparation completed: Yes  Pain : No/denies pain Pain Score: 0-No pain     BMI - recorded: 34.54 Nutritional Status: BMI > 30  Obese Nutritional Risks: None Diabetes: Yes (Most recent A1C 9.5) CBG done?: No (patient reported 186-197 range) Did pt. bring in CBG monitor from home?: No  How often do you need to have someone help you when you read instructions, pamphlets, or other written materials from your doctor or pharmacy?: 1 - Never  Interpreter Needed?: No      Activities of Daily Living    07/08/2023    2:33 PM  In your present state of health, do you have any difficulty performing the following activities:  Hearing? 0  Vision? 0  Difficulty concentrating or making decisions? 0  Walking or climbing stairs? 1  Comment uses walker at times, off balance when legs are swollen  Dressing or bathing? 0  Doing errands, shopping? 0  Preparing Food and eating ? N  Using the Toilet? N  In the past six months, have you accidently leaked urine? Y  Do you have problems with loss of bowel control? N  Managing your Medications? N  Managing your Finances? N  Housekeeping or managing your Housekeeping? N    Patient Care Team: Marianne Sofia, Cordelia Poche as PCP -  General (Physician Assistant) Weston Settle, MD as Consulting Physician (Hematology and Oncology) Iven Finn., MD as Referring Physician (Neurology)  Indicate any recent Medical Services you may have received from other than [facility] providers in the past year (date may be approximate).     Assessment:   This is a routine wellness examination for Alicia Kelly.  Hearing/Vision screen No results found.   Goals Addressed   None    Depression Screen    07/08/2023    2:04 PM 02/02/2023   10:45 AM 10/28/2022   10:41 AM 07/07/2022   10:31 AM 06/18/2022   10:05 AM 07/03/2020   10:06 AM  PHQ 2/9 Scores  PHQ - 2 Score 0 0 0 0 0 0  PHQ- 9 Score  0        Fall Risk    07/08/2023    2:04 PM 02/02/2023   10:44 AM 10/28/2022   10:41 AM 07/07/2022   10:35 AM 06/18/2022   10:04 AM  Fall Risk   Falls in the past year? 1 1 1  0 0  Number falls in past yr: 1 1 0 0 0  Injury with Fall? 0 1 1 0 0  Risk for fall due to :   History of fall(s);No Fall Risks No Fall Risks No Fall Risks  Follow up  Falls evaluation completed Falls evaluation completed Falls evaluation completed;Education provided     MEDICARE RISK AT HOME: Medicare Risk at Home Any stairs in or around the home?: No If so, are there any without handrails?: No Home free of loose throw rugs in walkways, pet beds, electrical cords, etc?: Yes Adequate lighting in your home to reduce risk of falls?: Yes Life alert?: No Use of a cane, walker or w/c?: Yes Grab bars in the bathroom?: No Shower chair or bench in shower?: No Elevated toilet seat or a handicapped toilet?: No  TIMED UP AND GO:  Was the test performed?  No    Cognitive Function:        07/08/2023    2:34 PM 07/07/2022   10:36 AM  6CIT Screen  What Year? 0 points 0 points  What month? 0 points 0 points  What time? 0 points 0 points  Count back from 20 0 points 0 points  Months in reverse 2 points 2 points  Repeat phrase 0 points 2 points  Total Score 2 points 4  points    Immunizations Immunization History  Administered Date(s) Administered   Fluad Quad(high Dose 65+) 06/18/2022   Fluad Trivalent(High Dose 65+) 01/21/2023   Influenza Inj Mdck Quad Pf 06/05/2021   Moderna Covid-19 Fall Seasonal Vaccine 34yrs & older 06/02/2022   Moderna Covid-19 Vaccine Bivalent Booster 38yrs & up 11/10/2021   Moderna Sars-Covid-2 Vaccination 09/15/2019, 10/13/2019, 05/15/2020, 11/10/2020   PFIZER(Purple Top)SARS-COV-2 Vaccination 06/02/2022   PNEUMOCOCCAL CONJUGATE-20 06/18/2022   Pneumococcal Polysaccharide-23 10/02/2020   Tdap 04/04/2014    TDAP status: Up to date  Flu Vaccine status: Up to date  Pneumococcal vaccine status: Up to date  Covid-19 vaccine status: Declined, Education has been provided regarding the importance of this vaccine but patient still declined. Advised may receive this vaccine at local pharmacy or Health Dept.or vaccine clinic. Aware to provide a copy of the vaccination record if obtained from local pharmacy or Health Dept. Verbalized acceptance and understanding.  Qualifies for Shingles Vaccine? Yes   Zostavax completed No   Shingrix Completed?: No.    Education has been provided regarding the importance of this vaccine. Patient has been advised to call insurance company to determine out of pocket expense if they have not yet received this vaccine. Advised may also receive vaccine at local pharmacy or Health Dept. Verbalized acceptance and understanding.  Screening Tests Health Maintenance  Topic Date Due   OPHTHALMOLOGY EXAM  Never done   Zoster Vaccines- Shingrix (1 of 2) Never done   MAMMOGRAM  11/12/2022   Diabetic kidney evaluation - Urine ACR  06/19/2023   Medicare Annual Wellness (AWV)  07/08/2023   DEXA SCAN  11/09/2023 (Originally 11/24/2021)   HEMOGLOBIN A1C  11/04/2023   FOOT EXAM  02/02/2024   DTaP/Tdap/Td (2 - Td or Tdap) 04/04/2024   Diabetic kidney evaluation - eGFR measurement  05/05/2024   Colonoscopy   07/25/2024   Pneumonia Vaccine 18+ Years old  Completed   INFLUENZA VACCINE  Completed   Hepatitis C Screening  Completed   HPV VACCINES  Aged Out   COVID-19 Vaccine  Discontinued    Health Maintenance  Health Maintenance Due  Topic Date Due   OPHTHALMOLOGY EXAM  Never done   Zoster Vaccines- Shingrix (1 of 2) Never done   MAMMOGRAM  11/12/2022   Diabetic kidney evaluation - Urine ACR  06/19/2023   Medicare Annual Wellness (AWV)  07/08/2023    Colorectal cancer screening: Type of screening: Colonoscopy. Completed 07/2019. Repeat every 5 years  Mammogram status: Ordered   Bone Density status: Ordered  Lung Cancer Screening: (Low Dose CT Chest recommended if Age 37-80 years, 20 pack-year currently smoking OR have quit w/in 15years.) does not qualify.    Additional Screening:  Vision Screening: Recommended annual ophthalmology exams for early detection of glaucoma and other disorders of the eye. Is the patient up to date with their annual eye exam?  No   Dental Screening: Recommended annual dental exams for proper oral hygiene  Community Resource Referral / Chronic Care Management: CRR required this visit?  No   CCM required this visit?  No     Plan:    1- Recommended RSV Vaccine and Shingrix Vaccine 2- Patient agreed to have mammogram and DEXA - will order [facility] 3- Aim for 30 minutes of exercise or brisk walking, 6-8 glasses of water, and 5 servings of fruits and vegetables each day.   I have personally reviewed and noted the following in the patient's chart:   Medical and social history Use of alcohol, tobacco or illicit drugs  Current medications and supplements including opioid prescriptions.  Functional ability and status Nutritional status Physical activity Advanced directives List of other physicians Hospitalizations, surgeries, and ER visits in previous 12 months Vitals Screenings to include cognitive, depression, and falls Referrals  and appointments  In addition, I have reviewed and discussed with patient certain preventive protocols, quality metrics, and best practice recommendations. A written personalized care plan for preventive services as well as general preventive health recommendations were provided to patient.    Jacklynn Bue, LPN   08/24/8117   After Visit Summary: (MyChart) Due to this being a telephonic visit, the after visit summary with patients personalized plan was offered to patient via MyChart

## 2023-07-08 NOTE — Addendum Note (Signed)
Addended by: Jacklynn Bue on: 07/08/2023 03:34 PM   Modules accepted: Orders

## 2023-07-08 NOTE — Patient Instructions (Signed)
Alicia Kelly , Thank you for taking time to come for your Medicare Wellness Visit. I appreciate your ongoing commitment to your health goals. Please review the following plan we discussed and let me know if I can assist you in the future.    This is a list of the screening recommended for you and due dates:  Health Maintenance  Topic Date Due   Eye exam for diabetics  Never done   Zoster (Shingles) Vaccine (1 of 2) Never done   Mammogram  11/12/2022   Yearly kidney health urinalysis for diabetes  06/19/2023   Medicare Annual Wellness Visit  07/08/2023   DEXA scan (bone density measurement)  11/09/2023*   Hemoglobin A1C  11/04/2023   Complete foot exam   02/02/2024   DTaP/Tdap/Td vaccine (2 - Td or Tdap) 04/04/2024   Yearly kidney function blood test for diabetes  05/05/2024   Colon Cancer Screening  07/25/2024   Pneumonia Vaccine  Completed   Flu Shot  Completed   Hepatitis C Screening  Completed   HPV Vaccine  Aged Out   COVID-19 Vaccine  Discontinued  *Topic was postponed. The date shown is not the original due date.    Preventive Care 10 Years and Older, Female Preventive care refers to lifestyle choices and visits with your health care provider that can promote health and wellness. What does preventive care include? A yearly physical exam. This is also called an annual well check. Dental exams once or twice a year. Routine eye exams. Ask your health care provider how often you should have your eyes checked. Personal lifestyle choices, including: Daily care of your teeth and gums. Regular physical activity. Eating a healthy diet. Avoiding tobacco and drug use. Limiting alcohol use. Practicing safe sex. Taking low-dose aspirin every day. Taking vitamin and mineral supplements as recommended by your health care provider. What happens during an annual well check? The services and screenings done by your health care provider during your annual well check will depend on your  age, overall health, lifestyle risk factors, and family history of disease. Counseling  Your health care provider may ask you questions about your: Alcohol use. Tobacco use. Drug use. Emotional well-being. Home and relationship well-being. Sexual activity. Eating habits. History of falls. Memory and ability to understand (cognition). Work and work Astronomer. Reproductive health. Screening  You may have the following tests or measurements: Height, weight, and BMI. Blood pressure. Lipid and cholesterol levels. These may be checked every 5 years, or more frequently if you are over 55 years old. Skin check. Lung cancer screening. You may have this screening every year starting at age 68 if you have a 30-pack-year history of smoking and currently smoke or have quit within the past 15 years. Fecal occult blood test (FOBT) of the stool. You may have this test every year starting at age 17. Flexible sigmoidoscopy or colonoscopy. You may have a sigmoidoscopy every 5 years or a colonoscopy every 10 years starting at age 6. Hepatitis C blood test. Hepatitis B blood test. Sexually transmitted disease (STD) testing. Diabetes screening. This is done by checking your blood sugar (glucose) after you have not eaten for a while (fasting). You may have this done every 1-3 years. Bone density scan. This is done to screen for osteoporosis. You may have this done starting at age 75. Mammogram. This may be done every 1-2 years. Talk to your health care provider about how often you should have regular mammograms. Talk with your health care  provider about your test results, treatment options, and if necessary, the need for more tests. Vaccines  Your health care provider may recommend certain vaccines, such as: Influenza vaccine. This is recommended every year. Tetanus, diphtheria, and acellular pertussis (Tdap, Td) vaccine. You may need a Td booster every 10 years. Zoster vaccine. You may need this after  age 72. Pneumococcal 13-valent conjugate (PCV13) vaccine. One dose is recommended after age 68. Pneumococcal polysaccharide (PPSV23) vaccine. One dose is recommended after age 49. Talk to your health care provider about which screenings and vaccines you need and how often you need them. This information is not intended to replace advice given to you by your health care provider. Make sure you discuss any questions you have with your health care provider. Document Released: 05/31/2015 Document Revised: 01/22/2016 Document Reviewed: 03/05/2015 Elsevier Interactive Patient Education  2017 [employer].  Fall Prevention in the Home Falls can cause injuries. They can happen to people of all ages. There are many things you can do to make your home safe and to help prevent falls. What can I do on the outside of my home? Regularly fix the edges of walkways and driveways and fix any cracks. Remove anything that might make you trip as you walk through a door, such as a raised step or threshold. Trim any bushes or trees on the path to your home. Use bright outdoor lighting. Clear any walking paths of anything that might make someone trip, such as rocks or tools. Regularly check to see if handrails are loose or broken. Make sure that both sides of any steps have handrails. Any raised decks and porches should have guardrails on the edges. Have any leaves, snow, or ice cleared regularly. Use sand or salt on walking paths during winter. Clean up any spills in your garage right away. This includes oil or grease spills. What can I do in the bathroom? Use night lights. Install grab bars by the toilet and in the tub and shower. Do not use towel bars as grab bars. Use non-skid mats or decals in the tub or shower. If you need to sit down in the shower, use a plastic, non-slip stool. Keep the floor dry. Clean up any water that spills on the floor as soon as it happens. Remove soap buildup in the tub or shower  regularly. Attach bath mats securely with double-sided non-slip rug tape. Do not have throw rugs and other things on the floor that can make you trip. What can I do in the bedroom? Use night lights. Make sure that you have a light by your bed that is easy to reach. Do not use any sheets or blankets that are too big for your bed. They should not hang down onto the floor. Have a firm chair that has side arms. You can use this for support while you get dressed. Do not have throw rugs and other things on the floor that can make you trip. What can I do in the kitchen? Clean up any spills right away. Avoid walking on wet floors. Keep items that you use a lot in easy-to-reach places. If you need to reach something above you, use a strong step stool that has a grab bar. Keep electrical cords out of the way. Do not use floor polish or wax that makes floors slippery. If you must use wax, use non-skid floor wax. Do not have throw rugs and other things on the floor that can make you trip. What can I  do with my stairs? Do not leave any items on the stairs. Make sure that there are handrails on both sides of the stairs and use them. Fix handrails that are broken or loose. Make sure that handrails are as long as the stairways. Check any carpeting to make sure that it is firmly attached to the stairs. Fix any carpet that is loose or worn. Avoid having throw rugs at the top or bottom of the stairs. If you do have throw rugs, attach them to the floor with carpet tape. Make sure that you have a light switch at the top of the stairs and the bottom of the stairs. If you do not have them, ask someone to add them for you. What else can I do to help prevent falls? Wear shoes that: Do not have high heels. Have rubber bottoms. Are comfortable and fit you well. Are closed at the toe. Do not wear sandals. If you use a stepladder: Make sure that it is fully opened. Do not climb a closed stepladder. Make sure that  both sides of the stepladder are locked into place. Ask someone to hold it for you, if possible. Clearly mark and make sure that you can see: Any grab bars or handrails. First and last steps. Where the edge of each step is. Use tools that help you move around (mobility aids) if they are needed. These include: Canes. Walkers. Scooters. Crutches. Turn on the lights when you go into a dark area. Replace any light bulbs as soon as they burn out. Set up your furniture so you have a clear path. Avoid moving your furniture around. If any of your floors are uneven, fix them. If there are any pets around you, be aware of where they are. Review your medicines with your doctor. Some medicines can make you feel dizzy. This can increase your chance of falling. Ask your doctor what other things that you can do to help prevent falls. This information is not intended to replace advice given to you by your health care provider. Make sure you discuss any questions you have with your health care provider. Document Released: 02/28/2009 Document Revised: 10/10/2015 Document Reviewed: 06/08/2014 Elsevier Interactive Patient Education  2017 [employer].

## 2023-07-23 ENCOUNTER — Other Ambulatory Visit: Payer: Self-pay | Admitting: Physician Assistant

## 2023-07-23 DIAGNOSIS — Z794 Long term (current) use of insulin: Secondary | ICD-10-CM

## 2023-07-23 NOTE — Telephone Encounter (Signed)
 Refills are available through patient's pharmacy. Per chart view-blood glucose test strips were written with 12 refills per order from January.

## 2023-07-23 NOTE — Telephone Encounter (Signed)
 Copied from CRM [id number]. Topic: Clinical - Medication Refill >> Jul 23, 2023 11:56 AM Higinio Roger wrote: Most Recent Primary Care Visit:  Provider: Jacklynn Bue  Department: [facility]  Visit Type: MEDICARE AWV, SEQUENTIAL  Date: 07/08/2023  Medication: glucose blood (ACCU-CHEK GUIDE TEST) test strip   Has the patient contacted their pharmacy? Yes (Agent: If no, request that the patient contact the pharmacy for the refill. If patient does not wish to contact the pharmacy document the reason why and proceed with request.) (Agent: If yes, when and what did the pharmacy advise?) [facility]. Patient is requesting it to be filled at [facility]  Is this the correct pharmacy for this prescription? Yes  If no, delete pharmacy and type the correct one.  This is the patient's preferred pharmacy:   [facility] - [place], [place] - [address] [place] [address] Phone: [phone] Fax: [phone]   Has the prescription been filled recently? Yes  Is the patient out of the medication? Yes  Has the patient been seen for an appointment in the last year OR does the patient have an upcoming appointment? Yes  Can we respond through MyChart? Yes  Agent: Please be advised that Rx refills may take up to 3 business days. We ask that you follow-up with your pharmacy.

## 2023-07-27 ENCOUNTER — Telehealth: Payer: Self-pay

## 2023-07-27 ENCOUNTER — Telehealth: Payer: Self-pay | Admitting: Physician Assistant

## 2023-07-27 ENCOUNTER — Encounter: Payer: Self-pay | Admitting: Oncology

## 2023-07-27 DIAGNOSIS — G40309 Generalized idiopathic epilepsy and epileptic syndromes, not intractable, without status epilepticus: Secondary | ICD-10-CM

## 2023-07-27 NOTE — Telephone Encounter (Signed)
 Copied from CRM [id number]. Topic: General - Other >> Jul 27, 2023  9:55 AM Franchot Heidelberg wrote: Reason for CRM: Pt says she missed a call yesterday regarding seeing a specialist for epilepsy. Pt has questions for the clinic

## 2023-07-27 NOTE — Telephone Encounter (Signed)
 [facility] [phone]

## 2023-07-27 NOTE — Telephone Encounter (Addendum)
 Called Patient she stated she needs 7 day supplies of Vimpat 200 mg, she takes 1.5 tablet twice a day. Called [facility] and they stated they only have a rx for 200 mg twice a day.  Made them aware I would call patient neurologist and see how the patient is suppose to be taking medication  Called and spoke to someone from [facility] Neurologist and they stated they have the patient takes medication twice a day, after reviewing notes from care every, it states for patient to take Vimpat 200 mg three times a day. Nurse is going to give me a call back once she verify how the patient is suppose to take medication

## 2023-07-27 NOTE — Telephone Encounter (Signed)
 Called patient after looking in here chart care everywhere, looks like medication was sent. Was not able to leave voicemail due to voicemail being full.    Copied from CRM [id number]. Topic: General - Other >> Jul 26, 2023  5:11 PM Ja-Kwan M wrote: Reason for CRM: Patient stated that she has been having a problem getting her seizure medication and she would like to ask if Kennon Rounds could help. Patient asked if Kennon Rounds would prescribed about 10 pills to at least get her to the appointment with the other doctor. Patient requests call back to advise. Call back# [phone]

## 2023-08-03 ENCOUNTER — Other Ambulatory Visit (HOSPITAL_BASED_OUTPATIENT_CLINIC_OR_DEPARTMENT_OTHER): Payer: Medicare HMO | Admitting: Radiology

## 2023-08-03 ENCOUNTER — Inpatient Hospital Stay (HOSPITAL_BASED_OUTPATIENT_CLINIC_OR_DEPARTMENT_OTHER): Admission: RE | Admit: 2023-08-03 | Payer: Medicare HMO | Source: Ambulatory Visit | Admitting: Radiology

## 2023-08-10 ENCOUNTER — Ambulatory Visit: Payer: Medicare Other | Admitting: Physician Assistant

## 2023-08-23 ENCOUNTER — Other Ambulatory Visit: Payer: Self-pay | Admitting: Physician Assistant

## 2023-08-23 DIAGNOSIS — E1169 Type 2 diabetes mellitus with other specified complication: Secondary | ICD-10-CM

## 2023-09-02 ENCOUNTER — Ambulatory Visit (INDEPENDENT_AMBULATORY_CARE_PROVIDER_SITE_OTHER): Admitting: Physician Assistant

## 2023-09-02 ENCOUNTER — Encounter: Payer: Self-pay | Admitting: Physician Assistant

## 2023-09-02 ENCOUNTER — Telehealth: Payer: Self-pay | Admitting: Physician Assistant

## 2023-09-02 VITALS — BP 120/72 | HR 80 | Temp 98.0°F | Resp 18 | Ht 66.0 in | Wt 221.6 lb

## 2023-09-02 DIAGNOSIS — Z1231 Encounter for screening mammogram for malignant neoplasm of breast: Secondary | ICD-10-CM

## 2023-09-02 DIAGNOSIS — K2100 Gastro-esophageal reflux disease with esophagitis, without bleeding: Secondary | ICD-10-CM

## 2023-09-02 DIAGNOSIS — N3000 Acute cystitis without hematuria: Secondary | ICD-10-CM

## 2023-09-02 DIAGNOSIS — R600 Localized edema: Secondary | ICD-10-CM

## 2023-09-02 DIAGNOSIS — Z794 Long term (current) use of insulin: Secondary | ICD-10-CM

## 2023-09-02 DIAGNOSIS — G40309 Generalized idiopathic epilepsy and epileptic syndromes, not intractable, without status epilepticus: Secondary | ICD-10-CM

## 2023-09-02 DIAGNOSIS — N959 Unspecified menopausal and perimenopausal disorder: Secondary | ICD-10-CM

## 2023-09-02 DIAGNOSIS — E1169 Type 2 diabetes mellitus with other specified complication: Secondary | ICD-10-CM

## 2023-09-02 DIAGNOSIS — I10 Essential (primary) hypertension: Secondary | ICD-10-CM

## 2023-09-02 DIAGNOSIS — E782 Mixed hyperlipidemia: Secondary | ICD-10-CM

## 2023-09-02 DIAGNOSIS — E119 Type 2 diabetes mellitus without complications: Secondary | ICD-10-CM | POA: Diagnosis not present

## 2023-09-02 DIAGNOSIS — E559 Vitamin D deficiency, unspecified: Secondary | ICD-10-CM

## 2023-09-02 LAB — POCT URINALYSIS DIP (CLINITEK)
Bilirubin, UA: NEGATIVE
Blood, UA: NEGATIVE
Glucose, UA: NEGATIVE mg/dL
Ketones, POC UA: NEGATIVE mg/dL
Nitrite, UA: NEGATIVE
POC PROTEIN,UA: NEGATIVE
Spec Grav, UA: 1.015 (ref 1.010–1.025)
Urobilinogen, UA: 0.2 U/dL
pH, UA: 6 (ref 5.0–8.0)

## 2023-09-02 MED ORDER — DOXYCYCLINE HYCLATE 100 MG PO TABS: 100.0000 mg | ORAL_TABLET | Freq: Two times a day (BID) | ORAL | 0 refills | Status: DC

## 2023-09-02 MED ORDER — PANTOPRAZOLE SODIUM 40 MG PO TBEC: 40.0000 mg | DELAYED_RELEASE_TABLET | Freq: Two times a day (BID) | ORAL | 1 refills | Status: DC

## 2023-09-02 MED ORDER — TRESIBA FLEXTOUCH 100 UNIT/ML ~~LOC~~ SOPN: 40.0000 [IU] | PEN_INJECTOR | Freq: Every day | SUBCUTANEOUS | Status: DC

## 2023-09-02 MED ORDER — ATORVASTATIN CALCIUM 40 MG PO TABS: 40.0000 mg | ORAL_TABLET | Freq: Every day | ORAL | 1 refills | Status: DC

## 2023-09-02 NOTE — Telephone Encounter (Signed)
 [facility] [phone]

## 2023-09-02 NOTE — Progress Notes (Signed)
 Subjective:  Patient ID: Alicia Kelly, female    DOB: Dec 30, 1955  Age: 68 y.o. MRN: [id number]  Chief Complaint  Patient presents with   Follow-up - chronic   Urinary frequency    HPI Vannah Nadal has a history of type 2 Diabetes Mellitus for more than 5 years. Current treatment includes ozempic 2mg  weekly and tresiba 40 units qd - her last hgb A1c was elevated and advised to start farxiga however pt never got medication from pharmacy - she does state she feels her glucose readings have improved since increasing tresiba to 40 units daily -   She denies hypoglycemic episodes. She states blood glucose levels at home range from 140s to 200s. She  is consuming a heart healthy diet and exercising regularly. She is overdue for eye exam  Mixed hyperlipidemia  Pt presents with hyperlipidemia. She is due for labwork -once again pt states that she has not been taking the medication and blames on pharmacy not filling med (despite the med has been sent in multiple times) - pt states she would like medication sent to [facility] pending  Pt with complaints of urinary urgency for the past few days and would like ua checked  Pt with diagnosis of generalized epilepsy - currently following with neurology and is following with them every 4 months Taking vimpat and keppra  Pt presents for follow up of hypertension.  However she has been off medication for several months and bp has been running well - she denies chest pain/sob/edema  Pt with GERD - she states she has not been taking protonix because has not gotten filled at pharmacy - request to change pharmacy and send in med to [facility]  Pt with Vit D def - is taking supplement twice weekly - due for labwork  Pt is due for mammogram and dexa scan - would like to schedule  Pt continues to have swelling of lower legs - she is taking demadex 10mg  - currently home health is coming out and wrapping her legs twice weekly (legs are wrapped today ) -  she states she does not have any open sores or lesions Pt does have hospital bed at home that she sleeps in that helps with keeping legs elevated to prevent further edema  Lab Results  Component Value Date   HGBA1C 9.5 (H) 05/06/2023   HGBA1C 8.4 (H) 02/02/2023   HGBA1C 8.4 (H) 10/28/2022        09/02/2023    9:57 AM 07/08/2023    2:04 PM 02/02/2023   10:45 AM 10/28/2022   10:41 AM 07/07/2022   10:31 AM  Depression screen PHQ 2/9  Decreased Interest 0 0 0 0 0  Down, Depressed, Hopeless 0 0 0 0 0  PHQ - 2 Score 0 0 0 0 0  Altered sleeping 0  0    Tired, decreased energy 0  0    Change in appetite 0  0    Feeling bad or failure about yourself  0  0    Trouble concentrating 0  0    Moving slowly or fidgety/restless 0  0    Suicidal thoughts 0  0    PHQ-9 Score 0  0    Difficult doing work/chores Not difficult at all            07/07/2022   10:35 AM 10/28/2022   10:41 AM 02/02/2023   10:44 AM 07/08/2023    2:04 PM 09/02/2023    9:56  AM  Fall Risk  Falls in the past year? 0 1 1 1 1   Was there an injury with Fall? 0 1 1 0 0  Fall Risk Category Calculator 0 2 3 2 1   Patient at Risk for Falls Due to No Fall Risks History of fall(s);No Fall Risks   No Fall Risks;History of fall(s)  Fall risk Follow up Falls evaluation completed;Education provided Falls evaluation completed Falls evaluation completed  Falls evaluation completed    CONSTITUTIONAL: Negative for chills, fatigue, fever, unintentional weight gain and unintentional weight loss.  E/N/T: Negative for ear pain, nasal congestion and sore throat.  CARDIOVASCULAR: Negative for chest pain, dizziness, palpitations - has chronic pedal edema RESPIRATORY: Negative for recent cough and dyspnea.  GASTROINTESTINAL: Negative for abdominal pain, acid reflux symptoms, constipation, diarrhea, nausea and vomiting.  MSK: Negative for arthralgias and myalgias.  INTEGUMENTARY: Negative for rash.  NEUROLOGICAL: Negative for dizziness and  headaches.  PSYCHIATRIC: Negative for sleep disturbance and to question depression screen.  Negative for depression, negative for anhedonia.        Current Outpatient Medications:    Accu-Chek Softclix Lancets lancets, TEST BLOOD SUGAR THREE TIMES DAILY, Disp: 300 each, Rfl: 1   Blood Glucose Calibration (ACCU-CHEK AVIVA) SOLN, , Disp: , Rfl:    Blood Glucose Monitoring Suppl (ACCU-CHEK AVIVA PLUS) w/Device KIT, 1 each by Does not apply route 3 (three) times daily., Disp: 1 kit, Rfl: 0   clotrimazole-betamethasone (LOTRISONE) cream, APPLY TOPICALLY TWICE A DAY, Disp: 30 g, Rfl: 2   doxycycline (VIBRA-TABS) 100 MG tablet, Take 1 tablet (100 mg total) by mouth 2 (two) times daily., Disp: 20 tablet, Rfl: 0   fluticasone (FLONASE) 50 MCG/ACT nasal spray, Place 2 sprays into both nostrils daily., Disp: , Rfl:    glucose blood (ACCU-CHEK GUIDE TEST) test strip, Use as instructed, Disp: 100 each, Rfl: 12   Insulin Pen Needle (B-D UF III MINI PEN NEEDLES) 31G X 5 MM MISC, USE 1 PEN NEEDLE DAILY, Disp: 100 each, Rfl: 2   lacosamide (VIMPAT) 200 MG TABS tablet, Take 1.5 tablets (300 mg total) by mouth 2 (two) times daily. (Patient taking differently: Take 200 mg by mouth 2 (two) times daily.), Disp: 180 tablet, Rfl: 2   levETIRAcetam (KEPPRA) 1000 MG tablet, Take 1 tablet (1,000 mg total) by mouth in the morning, at noon, and at bedtime., Disp: 90 tablet, Rfl: 6   ondansetron (ZOFRAN-ODT) 4 MG disintegrating tablet, TAKE 1 TABLET BY MOUTH EVERY 8 HOURS AS NEEDED FOR NAUSEA AND VOMITING, Disp: 90 tablet, Rfl: 0   Semaglutide, 2 MG/DOSE, (OZEMPIC, 2 MG/DOSE,) 8 MG/3ML SOPN, INJECT 2 MG INTO THE SKIN ONCE A WEEK., Disp: 3 mL, Rfl: 5   torsemide (DEMADEX) 10 MG tablet, TAKE 1 TABLET BY MOUTH TWICE A DAY, Disp: 180 tablet, Rfl: 1   Vitamin D, Ergocalciferol, (DRISDOL) 1.25 MG (50000 UNIT) CAPS capsule, TAKE 1 CAPSULE BY MOUTH TWICE A WEEK, Disp: 24 capsule, Rfl: 1   atorvastatin (LIPITOR) 40 MG tablet, Take 1  tablet (40 mg total) by mouth daily., Disp: 90 tablet, Rfl: 1   insulin degludec (TRESIBA FLEXTOUCH) 100 UNIT/ML FlexTouch Pen, Inject 40 Units into the skin daily., Disp: , Rfl:    pantoprazole (PROTONIX) 40 MG tablet, Take 1 tablet (40 mg total) by mouth 2 (two) times daily., Disp: 180 tablet, Rfl: 1  Past Medical History:  Diagnosis Date   Asthma    Calculus of gallbladder with chronic cholecystitis without obstruction 03/22/2017  Diabetes (HCC)    Epilepsy (HCC)    Hypertension    Sleep apnea    Objective:  PHYSICAL EXAM:   VS: BP 120/72   Pulse 80   Temp 98 F (36.7 C) (Temporal)   Resp 18   Ht 5\' 6"  (1.676 m)   Wt 221 lb 9.6 oz (100.5 kg)   LMP  (LMP Unknown)   SpO2 98%   BMI 35.77 kg/m   GEN: Well nourished, well developed, in no acute distress   Cardiac: RRR; no murmurs, rubs, or gallops, Respiratory:  normal respiratory rate and pattern with no distress - normal breath sounds with no rales, rhonchi, wheezes or rubs  MS: no deformity or atrophy  Skin: warm and dry, no rash  Neuro:  Alert and Oriented x 3, - CN II-Xii grossly intact Psych: euthymic mood, appropriate affect and demeanor Office Visit on 09/02/2023  Component Date Value Ref Range Status   Color, UA 09/02/2023 yellow  yellow Final   Clarity, UA 09/02/2023 cloudy (A)  clear Final   Glucose, UA 09/02/2023 negative  negative mg/dL Final   Bilirubin, UA 16/02/9603 negative  negative Final   Ketones, POC UA 09/02/2023 negative  negative mg/dL Final   Spec Grav, UA 54/01/8118 1.015  1.010 - 1.025 Final   Blood, UA 09/02/2023 negative  negative Final   pH, UA 09/02/2023 6.0  5.0 - 8.0 Final   POC PROTEIN,UA 09/02/2023 negative  negative, trace Final   Urobilinogen, UA 09/02/2023 0.2  0.2 or 1.0 E.U./dL Final   Nitrite, UA 14/78/2956 Negative  Negative Final   Leukocytes, UA 09/02/2023 Small (1+) (A)  Negative Final     Office Visit on 09/02/2023  Component Date Value Ref Range Status   Color, UA  09/02/2023 yellow  yellow Final   Clarity, UA 09/02/2023 cloudy (A)  clear Final   Glucose, UA 09/02/2023 negative  negative mg/dL Final   Bilirubin, UA 21/30/8657 negative  negative Final   Ketones, POC UA 09/02/2023 negative  negative mg/dL Final   Spec Grav, UA 84/69/6295 1.015  1.010 - 1.025 Final   Blood, UA 09/02/2023 negative  negative Final   pH, UA 09/02/2023 6.0  5.0 - 8.0 Final   POC PROTEIN,UA 09/02/2023 negative  negative, trace Final   Urobilinogen, UA 09/02/2023 0.2  0.2 or 1.0 E.U./dL Final   Nitrite, UA 28/41/3244 Negative  Negative Final   Leukocytes, UA 09/02/2023 Small (1+) (A)  Negative Final    Assessment & Plan:    Bilateral leg edema -     Torsemide 10 mg bid Continue therapy with home health  Vitamin D deficiency -     VITAMIN D 25 Hydroxy (Vit-D Deficiency, Fractures) Continue supplement twice weekly Labwork pending Insulin dependent type 2 diabetes mellitus (HCC) -     CBC with Differential/Platelet -     Comprehensive metabolic panel -     TSH -     Lipid panel -     Hemoglobin A1c Continue meds and will adjust therapy according to lab results Generalized epilepsy (HCC) Continue meds and follow up with neurology as directed Hyperlipidemia associated with type 2 diabetes mellitus (HCC) -     Comprehensive metabolic panel -     Lipid panel Restart lipitor Gastroesophageal reflux disease with esophagitis without hemorrhage  Restart protonix  Encounter for screening mammogram for breast cancer -     3D Screening Mammogram, Left and Right; Future  Other specified menopausal and perimenopausal disorders -  DG Bone Density; Future  UTI Urine culture pending Rx doxycycline 100mg  bid     Follow-up: Return in about 3 months (around 12/02/2023) for chronic fasting follow-up.  An After Visit Summary was printed and given to the patient.  Anthonette Bastos [facility] [phone]

## 2023-09-03 ENCOUNTER — Other Ambulatory Visit: Payer: Self-pay | Admitting: Physician Assistant

## 2023-09-03 DIAGNOSIS — E119 Type 2 diabetes mellitus without complications: Secondary | ICD-10-CM

## 2023-09-03 LAB — CBC WITH DIFFERENTIAL/PLATELET
Basophils Absolute: 0 10*3/uL (ref 0.0–0.2)
Basos: 0 %
EOS (ABSOLUTE): 0.2 10*3/uL (ref 0.0–0.4)
Eos: 2 %
Hematocrit: 36.8 % (ref 34.0–46.6)
Hemoglobin: 11.6 g/dL (ref 11.1–15.9)
Immature Grans (Abs): 0 10*3/uL (ref 0.0–0.1)
Immature Granulocytes: 0 %
Lymphocytes Absolute: 1.6 10*3/uL (ref 0.7–3.1)
Lymphs: 15 %
MCH: 27.9 pg (ref 26.6–33.0)
MCHC: 31.5 g/dL (ref 31.5–35.7)
MCV: 89 fL (ref 79–97)
Monocytes Absolute: 0.8 10*3/uL (ref 0.1–0.9)
Monocytes: 7 %
Neutrophils Absolute: 7.8 10*3/uL — ABNORMAL HIGH (ref 1.4–7.0)
Neutrophils: 76 %
Platelets: 529 10*3/uL — ABNORMAL HIGH (ref 150–450)
RBC: 4.16 x10E6/uL (ref 3.77–5.28)
RDW: 13.2 % (ref 11.7–15.4)
WBC: 10.4 10*3/uL (ref 3.4–10.8)

## 2023-09-03 LAB — MICROALBUMIN / CREATININE URINE RATIO
Creatinine, Urine: 92.7 mg/dL
Microalb/Creat Ratio: 4 mg/g{creat} (ref 0–29)
Microalbumin, Urine: 3.3 ug/mL

## 2023-09-03 LAB — COMPREHENSIVE METABOLIC PANEL WITH GFR
ALT: 14 IU/L (ref 0–32)
AST: 14 IU/L (ref 0–40)
Albumin: 4.1 g/dL (ref 3.9–4.9)
Alkaline Phosphatase: 89 IU/L (ref 44–121)
BUN/Creatinine Ratio: 28 (ref 12–28)
BUN: 22 mg/dL (ref 8–27)
Bilirubin Total: <0.2 mg/dL (ref 0.0–1.2)
CO2: 26 mmol/L (ref 20–29)
Calcium: 9.9 mg/dL (ref 8.7–10.3)
Chloride: 98 mmol/L (ref 96–106)
Creatinine, Ser: 0.8 mg/dL (ref 0.57–1.00)
Globulin, Total: 2.5 g/dL (ref 1.5–4.5)
Glucose: 168 mg/dL — ABNORMAL HIGH (ref 70–99)
Potassium: 4.5 mmol/L (ref 3.5–5.2)
Sodium: 138 mmol/L (ref 134–144)
Total Protein: 6.6 g/dL (ref 6.0–8.5)
eGFR: 81 mL/min/{1.73_m2} (ref >59)

## 2023-09-03 LAB — TSH: TSH: 0.969 u[IU]/mL (ref 0.450–4.500)

## 2023-09-03 LAB — LIPID PANEL
Chol/HDL Ratio: 2.7 ratio (ref 0.0–4.4)
Cholesterol, Total: 132 mg/dL (ref 100–199)
HDL: 49 mg/dL (ref >39)
LDL Chol Calc (NIH): 53 mg/dL (ref 0–99)
Triglycerides: 184 mg/dL — ABNORMAL HIGH (ref 0–149)
VLDL Cholesterol Cal: 30 mg/dL (ref 5–40)

## 2023-09-03 LAB — VITAMIN D 25 HYDROXY (VIT D DEFICIENCY, FRACTURES): Vit D, 25-Hydroxy: 74.3 ng/mL (ref 30.0–100.0)

## 2023-09-03 LAB — HEMOGLOBIN A1C
Est. average glucose Bld gHb Est-mCnc: 197 mg/dL
Hgb A1c MFr Bld: 8.5 % — ABNORMAL HIGH (ref 4.8–5.6)

## 2023-09-03 MED ORDER — DAPAGLIFLOZIN PROPANEDIOL 5 MG PO TABS: 5.0000 mg | ORAL_TABLET | Freq: Every day | ORAL | 2 refills | Status: DC

## 2023-09-04 LAB — URINE CULTURE

## 2023-09-07 ENCOUNTER — Telehealth: Payer: Self-pay

## 2023-09-07 NOTE — Telephone Encounter (Signed)
 She is a current patient there and was seen in August and September Did she go for a follow up appt? --- should not need new referral for chronic follow up  I did not send her back for any new problems Please call hematology office and clarify what patient actually needs done

## 2023-09-07 NOTE — Telephone Encounter (Signed)
 Patient went to hematologist office and they told her that she needs a new referral because the last one was last year. Please advice

## 2023-09-07 NOTE — Telephone Encounter (Signed)
 Called [facility] and left message for them to give me a call back at my direct number.

## 2023-09-08 ENCOUNTER — Other Ambulatory Visit: Payer: Self-pay | Admitting: Physician Assistant

## 2023-09-08 DIAGNOSIS — B354 Tinea corporis: Secondary | ICD-10-CM

## 2023-09-08 DIAGNOSIS — E782 Mixed hyperlipidemia: Secondary | ICD-10-CM

## 2023-09-08 DIAGNOSIS — K2100 Gastro-esophageal reflux disease with esophagitis, without bleeding: Secondary | ICD-10-CM

## 2023-09-08 DIAGNOSIS — E559 Vitamin D deficiency, unspecified: Secondary | ICD-10-CM

## 2023-09-13 ENCOUNTER — Telehealth: Payer: Self-pay

## 2023-09-13 NOTE — Telephone Encounter (Signed)
 Reason for CRM: THE PATIENT CALLED [facility] TOLD HER THE PROVIDER HAS TO CALL THEM OR PUT IN AN ORDER FOR HER TO GET HER IRON  CHECKED.

## 2023-09-13 NOTE — Telephone Encounter (Addendum)
 Spoke with patient and made her aware I would call hematology office and see what is going on. I called hematology office and made her an appointment  May 12 at 11 AM  Called patient back and made er aware of appointment due to her husband having an appointment the same day, her about was changed to 10/08/23 at 10:30 AM

## 2023-09-27 ENCOUNTER — Ambulatory Visit: Admitting: Oncology

## 2023-10-01 ENCOUNTER — Encounter: Payer: Self-pay | Admitting: Oncology

## 2023-10-06 ENCOUNTER — Other Ambulatory Visit: Payer: Self-pay | Admitting: Physician Assistant

## 2023-10-07 ENCOUNTER — Other Ambulatory Visit: Payer: Self-pay | Admitting: Oncology

## 2023-10-07 DIAGNOSIS — D72829 Elevated white blood cell count, unspecified: Secondary | ICD-10-CM

## 2023-10-07 NOTE — Progress Notes (Signed)
 [facility]  [address] [place],  [place]  [address] [phone]  Clinic Day: 10/08/2023  Referring physician: Cyndi Drain, PA-C  HISTORY OF PRESENT ILLNESS:  The patient is a 68 y.o. female  with leukocytosis.  Furthermore, she has been deficient in iron , vitamin B12, and folic acid  to where they had to be replaced.  Of note, she last received IV iron  in August/September 2024.  She comes in today to reassess her peripheral counts and her vitamin levels.  Since her last visit, the patient has been doing okay.  However, she recently went to [facility] where large polyps in her stomach had to be removed.  Due to areas of bleeding in her stomach, clamps had to be placed.  Since her GI procedure, the patient claims to be doing fairly well.  She denies having increased fatigue or any overt forms of GI blood loss which concern her progressive anemia.  PHYSICAL EXAM:  Blood pressure 133/83, pulse 77, temperature 98.4 F (36.9 C), temperature source Oral, resp. rate 16, height 5\' 6"  (1.676 m), weight 219 lb 14.4 oz (99.7 kg), SpO2 100%. Wt Readings from Last 3 Encounters:  10/08/23 219 lb 14.4 oz (99.7 kg)  09/02/23 221 lb 9.6 oz (100.5 kg)  07/08/23 214 lb (97.1 kg)   Body mass index is 35.49 kg/m. Performance status (ECOG): 2 - Symptomatic, <50% confined to bed Physical Exam Constitutional:      Appearance: Normal appearance. She is not ill-appearing.  HENT:     Mouth/Throat:     Mouth: Mucous membranes are moist.     Pharynx: Oropharynx is clear. No oropharyngeal exudate or posterior oropharyngeal erythema.  Cardiovascular:     Rate and Rhythm: Normal rate and regular rhythm.     Heart sounds: No murmur heard.    No friction rub. No gallop.  Pulmonary:     Effort: Pulmonary effort is normal. No respiratory distress.     Breath sounds: Normal breath sounds. No wheezing, rhonchi or rales.  Abdominal:     General: Bowel sounds are normal. There is no  distension.     Palpations: Abdomen is soft. There is no mass.     Tenderness: There is no abdominal tenderness.  Musculoskeletal:        General: No swelling.     Right lower leg: No edema.     Left lower leg: No edema.  Lymphadenopathy:     Cervical: No cervical adenopathy.     Upper Body:     Right upper body: No supraclavicular or axillary adenopathy.     Left upper body: No supraclavicular or axillary adenopathy.     Lower Body: No right inguinal adenopathy. No left inguinal adenopathy.  Skin:    General: Skin is warm.     Coloration: Skin is not jaundiced.     Findings: No lesion or rash.  Neurological:     General: No focal deficit present.     Mental Status: She is alert and oriented to person, place, and time. Mental status is at baseline.  Psychiatric:        Mood and Affect: Mood normal.        Behavior: Behavior normal.        Thought Content: Thought content normal.    LABS:    Latest Reference Range & Units 10/08/23 10:54  WBC 4.0 - 10.5 K/uL 10.0  RBC 3.87 - 5.11 MIL/uL 4.05  Hemoglobin 12.0 - 15.0 g/dL 02.7 (L)  HCT 36.0 - 46.0 % 36.1  MCV 80.0 - 100.0 fL 89.1  MCH 26.0 - 34.0 pg 28.1  MCHC 30.0 - 36.0 g/dL 01.0  RDW 27.2 - 53.6 % 13.5  Platelets 150 - 400 K/uL 445 (H)  nRBC 0.0 - 0.2 % 0.0  Neutrophils % 75  Lymphocytes % 16  Monocytes Relative % 7  Eosinophil % 2  Basophil % 0  Immature Granulocytes % 0  (L): Data is abnormally low (H): Data is abnormally high   Latest Reference Range & Units 10/08/23 10:54  Iron  28 - 170 ug/dL 53  UIBC ug/dL 644  TIBC 034 - 742 ug/dL 595  Saturation Ratios 10.4 - 31.8 % 14  Ferritin 11 - 307 ng/mL 20  Folate >5.9 ng/mL 23.5  Vitamin B12 180 - 914 pg/mL 362   ASSESSMENT & PLAN:  A 68 y.o. female who I follow for leukocytosis and iron  deficiency anemia.  When evaluating her labs today, her white count remains normal.  Her hemoglobin is only slightly lower versus what it was previously.  However, her vitamin  B12, iron , and folate levels remain satisfactory.  Clinically, the patient appears to be doing well.  As that is the case, the I will see this patient back in 4 months to reassess her hematologic parameters.  The patient understands all the plans discussed today and is in agreement with them.   Brayln Duque Felicia Horde, MD

## 2023-10-08 ENCOUNTER — Inpatient Hospital Stay: Attending: Oncology | Admitting: Oncology

## 2023-10-08 ENCOUNTER — Telehealth: Payer: Self-pay | Admitting: Oncology

## 2023-10-08 ENCOUNTER — Inpatient Hospital Stay

## 2023-10-08 ENCOUNTER — Other Ambulatory Visit: Payer: Self-pay | Admitting: Oncology

## 2023-10-08 DIAGNOSIS — D509 Iron deficiency anemia, unspecified: Secondary | ICD-10-CM | POA: Insufficient documentation

## 2023-10-08 DIAGNOSIS — D72829 Elevated white blood cell count, unspecified: Secondary | ICD-10-CM | POA: Insufficient documentation

## 2023-10-08 DIAGNOSIS — Z79899 Other long term (current) drug therapy: Secondary | ICD-10-CM | POA: Insufficient documentation

## 2023-10-08 DIAGNOSIS — Z860100 Personal history of colon polyps, unspecified: Secondary | ICD-10-CM | POA: Insufficient documentation

## 2023-10-08 LAB — FERRITIN: Ferritin: 20 ng/mL (ref 11–307)

## 2023-10-08 LAB — CBC WITH DIFFERENTIAL (CANCER CENTER ONLY)
Abs Immature Granulocytes: 0.03 10*3/uL (ref 0.00–0.07)
Basophils Absolute: 0 10*3/uL (ref 0.0–0.1)
Basophils Relative: 0 %
Eosinophils Absolute: 0.2 10*3/uL (ref 0.0–0.5)
Eosinophils Relative: 2 %
HCT: 36.1 % (ref 36.0–46.0)
Hemoglobin: 11.4 g/dL — ABNORMAL LOW (ref 12.0–15.0)
Immature Granulocytes: 0 %
Lymphocytes Relative: 16 %
Lymphs Abs: 1.6 10*3/uL (ref 0.7–4.0)
MCH: 28.1 pg (ref 26.0–34.0)
MCHC: 31.6 g/dL (ref 30.0–36.0)
MCV: 89.1 fL (ref 80.0–100.0)
Monocytes Absolute: 0.7 10*3/uL (ref 0.1–1.0)
Monocytes Relative: 7 %
Neutro Abs: 7.5 10*3/uL (ref 1.7–7.7)
Neutrophils Relative %: 75 %
Platelet Count: 445 10*3/uL — ABNORMAL HIGH (ref 150–400)
RBC: 4.05 MIL/uL (ref 3.87–5.11)
RDW: 13.5 % (ref 11.5–15.5)
WBC Count: 10 10*3/uL (ref 4.0–10.5)
nRBC: 0 % (ref 0.0–0.2)

## 2023-10-08 LAB — IRON AND TIBC
Iron: 53 ug/dL (ref 28–170)
Saturation Ratios: 14 % (ref 10.4–31.8)
TIBC: 367 ug/dL (ref 250–450)
UIBC: 314 ug/dL

## 2023-10-08 LAB — VITAMIN B12: Vitamin B-12: 362 pg/mL (ref 180–914)

## 2023-10-08 LAB — CMP (CANCER CENTER ONLY)
ALT: 9 U/L (ref 0–44)
AST: 10 U/L — ABNORMAL LOW (ref 15–41)
Albumin: 3.7 g/dL (ref 3.5–5.0)
Alkaline Phosphatase: 82 U/L (ref 38–126)
Anion gap: 11 (ref 5–15)
BUN: 19 mg/dL (ref 8–23)
CO2: 26 mmol/L (ref 22–32)
Calcium: 9.8 mg/dL (ref 8.9–10.3)
Chloride: 103 mmol/L (ref 98–111)
Creatinine: 0.83 mg/dL (ref 0.44–1.00)
GFR, Estimated: >60 mL/min (ref >60)
Glucose, Bld: 232 mg/dL — ABNORMAL HIGH (ref 70–99)
Potassium: 3.8 mmol/L (ref 3.5–5.1)
Sodium: 140 mmol/L (ref 135–145)
Total Bilirubin: 0.3 mg/dL (ref 0.0–1.2)
Total Protein: 6.6 g/dL (ref 6.5–8.1)

## 2023-10-08 LAB — FOLATE: Folate: 23.5 ng/mL (ref >5.9)

## 2023-10-08 NOTE — Telephone Encounter (Signed)
 Patient has been scheduled for follow-up visit per 10/06/23 LOS.  Pt given an appt calendar with date and time.

## 2023-10-12 ENCOUNTER — Telehealth: Payer: Self-pay

## 2023-10-12 NOTE — Telephone Encounter (Signed)
  Latest Reference Range & Units 10/08/23 10:54   Iron  28 - 170 ug/dL 53  UIBC ug/dL 295  TIBC 284 - 132 ug/dL 440  Saturation Ratios 10.4 - 31.8 % 14  Ferritin 11 - 307 ng/mL 20  Folate >5.9 ng/mL 23.5  Vitamin B12 180 - 914 pg/mL 362    ASSESSMENT & PLAN:  A 68 y.o. female who I follow for leukocytosis and iron  deficiency anemia.  When evaluating her labs today, her white count remains normal.  Her hemoglobin is only slightly lower versus what it was previously.  However, her vitamin B12, iron , and folate levels remain satisfactory.  Clinically, the patient appears to be doing well.  As that is the case, the I will see this patient back in 4 months to reassess her hematologic parameters.  The patient understands all the plans discussed today and is in agreement with them.     Deloria Fetch, MD                  Electronically signed by Deloria Fetch, MD at 10/08/2023  6:03 PM

## 2023-10-18 DIAGNOSIS — I872 Venous insufficiency (chronic) (peripheral): Secondary | ICD-10-CM | POA: Diagnosis not present

## 2023-10-18 DIAGNOSIS — D508 Other iron deficiency anemias: Secondary | ICD-10-CM | POA: Diagnosis not present

## 2023-10-18 DIAGNOSIS — G4730 Sleep apnea, unspecified: Secondary | ICD-10-CM | POA: Diagnosis not present

## 2023-10-18 DIAGNOSIS — E1151 Type 2 diabetes mellitus with diabetic peripheral angiopathy without gangrene: Secondary | ICD-10-CM | POA: Diagnosis not present

## 2023-10-18 DIAGNOSIS — K8020 Calculus of gallbladder without cholecystitis without obstruction: Secondary | ICD-10-CM | POA: Diagnosis not present

## 2023-10-18 DIAGNOSIS — E782 Mixed hyperlipidemia: Secondary | ICD-10-CM | POA: Diagnosis not present

## 2023-10-18 DIAGNOSIS — J45909 Unspecified asthma, uncomplicated: Secondary | ICD-10-CM | POA: Diagnosis not present

## 2023-10-18 DIAGNOSIS — K2100 Gastro-esophageal reflux disease with esophagitis, without bleeding: Secondary | ICD-10-CM | POA: Diagnosis not present

## 2023-10-18 DIAGNOSIS — E538 Deficiency of other specified B group vitamins: Secondary | ICD-10-CM | POA: Diagnosis not present

## 2023-10-18 DIAGNOSIS — E559 Vitamin D deficiency, unspecified: Secondary | ICD-10-CM | POA: Diagnosis not present

## 2023-10-18 DIAGNOSIS — I10 Essential (primary) hypertension: Secondary | ICD-10-CM | POA: Diagnosis not present

## 2023-10-18 DIAGNOSIS — L89323 Pressure ulcer of left buttock, stage 3: Secondary | ICD-10-CM | POA: Diagnosis not present

## 2023-10-18 DIAGNOSIS — E1169 Type 2 diabetes mellitus with other specified complication: Secondary | ICD-10-CM | POA: Diagnosis not present

## 2023-10-21 DIAGNOSIS — J45909 Unspecified asthma, uncomplicated: Secondary | ICD-10-CM | POA: Diagnosis not present

## 2023-10-21 DIAGNOSIS — E1151 Type 2 diabetes mellitus with diabetic peripheral angiopathy without gangrene: Secondary | ICD-10-CM | POA: Diagnosis not present

## 2023-10-21 DIAGNOSIS — E1169 Type 2 diabetes mellitus with other specified complication: Secondary | ICD-10-CM | POA: Diagnosis not present

## 2023-10-21 DIAGNOSIS — E782 Mixed hyperlipidemia: Secondary | ICD-10-CM | POA: Diagnosis not present

## 2023-10-21 DIAGNOSIS — G4730 Sleep apnea, unspecified: Secondary | ICD-10-CM | POA: Diagnosis not present

## 2023-10-21 DIAGNOSIS — D508 Other iron deficiency anemias: Secondary | ICD-10-CM | POA: Diagnosis not present

## 2023-10-21 DIAGNOSIS — I10 Essential (primary) hypertension: Secondary | ICD-10-CM | POA: Diagnosis not present

## 2023-10-21 DIAGNOSIS — L89323 Pressure ulcer of left buttock, stage 3: Secondary | ICD-10-CM | POA: Diagnosis not present

## 2023-10-21 DIAGNOSIS — E538 Deficiency of other specified B group vitamins: Secondary | ICD-10-CM | POA: Diagnosis not present

## 2023-10-21 DIAGNOSIS — I872 Venous insufficiency (chronic) (peripheral): Secondary | ICD-10-CM | POA: Diagnosis not present

## 2023-10-21 DIAGNOSIS — K8020 Calculus of gallbladder without cholecystitis without obstruction: Secondary | ICD-10-CM | POA: Diagnosis not present

## 2023-10-21 DIAGNOSIS — K2100 Gastro-esophageal reflux disease with esophagitis, without bleeding: Secondary | ICD-10-CM | POA: Diagnosis not present

## 2023-10-21 DIAGNOSIS — E559 Vitamin D deficiency, unspecified: Secondary | ICD-10-CM | POA: Diagnosis not present

## 2023-10-25 DIAGNOSIS — G4730 Sleep apnea, unspecified: Secondary | ICD-10-CM | POA: Diagnosis not present

## 2023-10-25 DIAGNOSIS — I872 Venous insufficiency (chronic) (peripheral): Secondary | ICD-10-CM | POA: Diagnosis not present

## 2023-10-25 DIAGNOSIS — K2100 Gastro-esophageal reflux disease with esophagitis, without bleeding: Secondary | ICD-10-CM | POA: Diagnosis not present

## 2023-10-25 DIAGNOSIS — E559 Vitamin D deficiency, unspecified: Secondary | ICD-10-CM | POA: Diagnosis not present

## 2023-10-25 DIAGNOSIS — E1151 Type 2 diabetes mellitus with diabetic peripheral angiopathy without gangrene: Secondary | ICD-10-CM | POA: Diagnosis not present

## 2023-10-25 DIAGNOSIS — E538 Deficiency of other specified B group vitamins: Secondary | ICD-10-CM | POA: Diagnosis not present

## 2023-10-25 DIAGNOSIS — J45909 Unspecified asthma, uncomplicated: Secondary | ICD-10-CM | POA: Diagnosis not present

## 2023-10-25 DIAGNOSIS — E782 Mixed hyperlipidemia: Secondary | ICD-10-CM | POA: Diagnosis not present

## 2023-10-25 DIAGNOSIS — L89323 Pressure ulcer of left buttock, stage 3: Secondary | ICD-10-CM | POA: Diagnosis not present

## 2023-10-25 DIAGNOSIS — E1169 Type 2 diabetes mellitus with other specified complication: Secondary | ICD-10-CM | POA: Diagnosis not present

## 2023-10-25 DIAGNOSIS — D508 Other iron deficiency anemias: Secondary | ICD-10-CM | POA: Diagnosis not present

## 2023-10-25 DIAGNOSIS — K8020 Calculus of gallbladder without cholecystitis without obstruction: Secondary | ICD-10-CM | POA: Diagnosis not present

## 2023-10-25 DIAGNOSIS — I10 Essential (primary) hypertension: Secondary | ICD-10-CM | POA: Diagnosis not present

## 2023-10-27 DIAGNOSIS — R3915 Urgency of urination: Secondary | ICD-10-CM | POA: Diagnosis not present

## 2023-10-27 DIAGNOSIS — R300 Dysuria: Secondary | ICD-10-CM | POA: Diagnosis not present

## 2023-10-27 DIAGNOSIS — R350 Frequency of micturition: Secondary | ICD-10-CM | POA: Diagnosis not present

## 2023-10-28 ENCOUNTER — Telehealth: Payer: Self-pay | Admitting: Physician Assistant

## 2023-10-28 NOTE — Telephone Encounter (Signed)
 [facility] [phone]

## 2023-10-29 DIAGNOSIS — K8020 Calculus of gallbladder without cholecystitis without obstruction: Secondary | ICD-10-CM | POA: Diagnosis not present

## 2023-10-29 DIAGNOSIS — D508 Other iron deficiency anemias: Secondary | ICD-10-CM | POA: Diagnosis not present

## 2023-10-29 DIAGNOSIS — E559 Vitamin D deficiency, unspecified: Secondary | ICD-10-CM | POA: Diagnosis not present

## 2023-10-29 DIAGNOSIS — K2100 Gastro-esophageal reflux disease with esophagitis, without bleeding: Secondary | ICD-10-CM | POA: Diagnosis not present

## 2023-10-29 DIAGNOSIS — I10 Essential (primary) hypertension: Secondary | ICD-10-CM | POA: Diagnosis not present

## 2023-10-29 DIAGNOSIS — G4730 Sleep apnea, unspecified: Secondary | ICD-10-CM | POA: Diagnosis not present

## 2023-10-29 DIAGNOSIS — E1151 Type 2 diabetes mellitus with diabetic peripheral angiopathy without gangrene: Secondary | ICD-10-CM | POA: Diagnosis not present

## 2023-10-29 DIAGNOSIS — E1169 Type 2 diabetes mellitus with other specified complication: Secondary | ICD-10-CM | POA: Diagnosis not present

## 2023-10-29 DIAGNOSIS — L89323 Pressure ulcer of left buttock, stage 3: Secondary | ICD-10-CM | POA: Diagnosis not present

## 2023-10-29 DIAGNOSIS — J45909 Unspecified asthma, uncomplicated: Secondary | ICD-10-CM | POA: Diagnosis not present

## 2023-10-29 DIAGNOSIS — G40409 Other generalized epilepsy and epileptic syndromes, not intractable, without status epilepticus: Secondary | ICD-10-CM | POA: Diagnosis not present

## 2023-10-29 DIAGNOSIS — E538 Deficiency of other specified B group vitamins: Secondary | ICD-10-CM | POA: Diagnosis not present

## 2023-10-29 DIAGNOSIS — I872 Venous insufficiency (chronic) (peripheral): Secondary | ICD-10-CM | POA: Diagnosis not present

## 2023-10-29 DIAGNOSIS — E782 Mixed hyperlipidemia: Secondary | ICD-10-CM | POA: Diagnosis not present

## 2023-11-02 ENCOUNTER — Other Ambulatory Visit: Payer: Self-pay | Admitting: Physician Assistant

## 2023-11-02 DIAGNOSIS — I10 Essential (primary) hypertension: Secondary | ICD-10-CM | POA: Diagnosis not present

## 2023-11-02 DIAGNOSIS — E538 Deficiency of other specified B group vitamins: Secondary | ICD-10-CM | POA: Diagnosis not present

## 2023-11-02 DIAGNOSIS — G4730 Sleep apnea, unspecified: Secondary | ICD-10-CM | POA: Diagnosis not present

## 2023-11-02 DIAGNOSIS — J45909 Unspecified asthma, uncomplicated: Secondary | ICD-10-CM | POA: Diagnosis not present

## 2023-11-02 DIAGNOSIS — E1169 Type 2 diabetes mellitus with other specified complication: Secondary | ICD-10-CM | POA: Diagnosis not present

## 2023-11-02 DIAGNOSIS — E1151 Type 2 diabetes mellitus with diabetic peripheral angiopathy without gangrene: Secondary | ICD-10-CM | POA: Diagnosis not present

## 2023-11-02 DIAGNOSIS — I872 Venous insufficiency (chronic) (peripheral): Secondary | ICD-10-CM | POA: Diagnosis not present

## 2023-11-02 DIAGNOSIS — K2100 Gastro-esophageal reflux disease with esophagitis, without bleeding: Secondary | ICD-10-CM | POA: Diagnosis not present

## 2023-11-02 DIAGNOSIS — K8020 Calculus of gallbladder without cholecystitis without obstruction: Secondary | ICD-10-CM | POA: Diagnosis not present

## 2023-11-02 DIAGNOSIS — L89323 Pressure ulcer of left buttock, stage 3: Secondary | ICD-10-CM | POA: Diagnosis not present

## 2023-11-02 DIAGNOSIS — D508 Other iron deficiency anemias: Secondary | ICD-10-CM | POA: Diagnosis not present

## 2023-11-02 DIAGNOSIS — E559 Vitamin D deficiency, unspecified: Secondary | ICD-10-CM | POA: Diagnosis not present

## 2023-11-02 DIAGNOSIS — E782 Mixed hyperlipidemia: Secondary | ICD-10-CM | POA: Diagnosis not present

## 2023-11-04 DIAGNOSIS — K8020 Calculus of gallbladder without cholecystitis without obstruction: Secondary | ICD-10-CM | POA: Diagnosis not present

## 2023-11-04 DIAGNOSIS — E1151 Type 2 diabetes mellitus with diabetic peripheral angiopathy without gangrene: Secondary | ICD-10-CM | POA: Diagnosis not present

## 2023-11-04 DIAGNOSIS — E559 Vitamin D deficiency, unspecified: Secondary | ICD-10-CM | POA: Diagnosis not present

## 2023-11-04 DIAGNOSIS — I872 Venous insufficiency (chronic) (peripheral): Secondary | ICD-10-CM | POA: Diagnosis not present

## 2023-11-04 DIAGNOSIS — K2100 Gastro-esophageal reflux disease with esophagitis, without bleeding: Secondary | ICD-10-CM | POA: Diagnosis not present

## 2023-11-04 DIAGNOSIS — E782 Mixed hyperlipidemia: Secondary | ICD-10-CM | POA: Diagnosis not present

## 2023-11-04 DIAGNOSIS — L89323 Pressure ulcer of left buttock, stage 3: Secondary | ICD-10-CM | POA: Diagnosis not present

## 2023-11-04 DIAGNOSIS — G4730 Sleep apnea, unspecified: Secondary | ICD-10-CM | POA: Diagnosis not present

## 2023-11-04 DIAGNOSIS — I10 Essential (primary) hypertension: Secondary | ICD-10-CM | POA: Diagnosis not present

## 2023-11-04 DIAGNOSIS — E538 Deficiency of other specified B group vitamins: Secondary | ICD-10-CM | POA: Diagnosis not present

## 2023-11-04 DIAGNOSIS — D508 Other iron deficiency anemias: Secondary | ICD-10-CM | POA: Diagnosis not present

## 2023-11-04 DIAGNOSIS — E1169 Type 2 diabetes mellitus with other specified complication: Secondary | ICD-10-CM | POA: Diagnosis not present

## 2023-11-04 DIAGNOSIS — J45909 Unspecified asthma, uncomplicated: Secondary | ICD-10-CM | POA: Diagnosis not present

## 2023-11-07 ENCOUNTER — Encounter: Payer: Self-pay | Admitting: Oncology

## 2023-11-08 ENCOUNTER — Ambulatory Visit: Payer: Self-pay

## 2023-11-08 DIAGNOSIS — I10 Essential (primary) hypertension: Secondary | ICD-10-CM | POA: Diagnosis not present

## 2023-11-08 DIAGNOSIS — K2100 Gastro-esophageal reflux disease with esophagitis, without bleeding: Secondary | ICD-10-CM | POA: Diagnosis not present

## 2023-11-08 DIAGNOSIS — L89323 Pressure ulcer of left buttock, stage 3: Secondary | ICD-10-CM | POA: Diagnosis not present

## 2023-11-08 DIAGNOSIS — I872 Venous insufficiency (chronic) (peripheral): Secondary | ICD-10-CM | POA: Diagnosis not present

## 2023-11-08 DIAGNOSIS — E538 Deficiency of other specified B group vitamins: Secondary | ICD-10-CM | POA: Diagnosis not present

## 2023-11-08 DIAGNOSIS — E782 Mixed hyperlipidemia: Secondary | ICD-10-CM | POA: Diagnosis not present

## 2023-11-08 DIAGNOSIS — E1151 Type 2 diabetes mellitus with diabetic peripheral angiopathy without gangrene: Secondary | ICD-10-CM | POA: Diagnosis not present

## 2023-11-08 DIAGNOSIS — G4730 Sleep apnea, unspecified: Secondary | ICD-10-CM | POA: Diagnosis not present

## 2023-11-08 DIAGNOSIS — D508 Other iron deficiency anemias: Secondary | ICD-10-CM | POA: Diagnosis not present

## 2023-11-08 DIAGNOSIS — E559 Vitamin D deficiency, unspecified: Secondary | ICD-10-CM | POA: Diagnosis not present

## 2023-11-08 DIAGNOSIS — E1169 Type 2 diabetes mellitus with other specified complication: Secondary | ICD-10-CM | POA: Diagnosis not present

## 2023-11-08 DIAGNOSIS — J45909 Unspecified asthma, uncomplicated: Secondary | ICD-10-CM | POA: Diagnosis not present

## 2023-11-08 DIAGNOSIS — K8020 Calculus of gallbladder without cholecystitis without obstruction: Secondary | ICD-10-CM | POA: Diagnosis not present

## 2023-11-08 NOTE — Telephone Encounter (Signed)
 FYI Only or Action Required?: FYI only for provider.  Patient was last seen in primary care on 09/02/2023 by Nicholaus Credit, PA-C. Called Nurse Triage reporting Mass. Symptoms began several weeks ago. Interventions attempted: OTC medications: tylenol . Symptoms are: gradually worsening.  Triage Disposition: See PCP When Office is Open (Within 3 Days)  Patient/caregiver understands and will follow disposition?: Yes       Copied from CRM [id number]. Topic: Clinical - Red Word Triage >> Nov 08, 2023  1:30 PM Selinda RAMAN wrote: Red Word that prompted transfer to Nurse Triage: The patient called in stating she fell about 2 weeks ago landing on her side and she has swelling on her left side below her ribs close to her back. I will transfer her to E2C2 NT Reason for Disposition  [1] Small swelling or lump AND [2] unexplained AND [3] present > 1 week  Answer Assessment - Initial Assessment Questions 1. APPEARANCE of SWELLING: What does it look like?     knot 2. SIZE: How large is the swelling? (e.g., inches, cm; or compare to size of pinhead, tip of pen, eraser, coin, pea, grape, ping pong ball)      Tip of thumb now to the bend of thumb 3. LOCATION: Where is the swelling located?     Left side towards back of ribs 4. ONSET: When did the swelling start?     2-3 weeks 5. COLOR: What color is it? Is there more than one color?     Normal skin color 6. PAIN: Is there any pain? If Yes, ask: How bad is the pain? (e.g., scale 1-10; or mild, moderate, severe)     - NONE (0): no pain   - MILD (1-3): doesn't interfere with normal activities    - MODERATE (4-7): interferes with normal activities or awakens from sleep    - SEVERE (8-10): excruciating pain, unable to do any normal activities      1/10 during day and 4/10 at night when laying on that side 7. ITCH: Does it itch? If Yes, ask: How bad is the itch?      no 8. CAUSE: What do you think caused the swelling? unknown 9 OTHER  SYMPTOMS: Do you have any other symptoms? (e.g., fever)     no  Protocols used: Skin Lump or Localized Swelling-A-AH

## 2023-11-09 ENCOUNTER — Other Ambulatory Visit: Payer: Self-pay

## 2023-11-09 ENCOUNTER — Encounter: Payer: Self-pay | Admitting: Family Medicine

## 2023-11-09 ENCOUNTER — Ambulatory Visit (INDEPENDENT_AMBULATORY_CARE_PROVIDER_SITE_OTHER): Admitting: Family Medicine

## 2023-11-09 VITALS — BP 100/70 | HR 76 | Temp 97.3°F | Resp 16 | Ht 66.0 in | Wt 220.0 lb

## 2023-11-09 DIAGNOSIS — G40209 Localization-related (focal) (partial) symptomatic epilepsy and epileptic syndromes with complex partial seizures, not intractable, without status epilepticus: Secondary | ICD-10-CM | POA: Diagnosis not present

## 2023-11-09 DIAGNOSIS — R2242 Localized swelling, mass and lump, left lower limb: Secondary | ICD-10-CM | POA: Diagnosis not present

## 2023-11-09 DIAGNOSIS — E782 Mixed hyperlipidemia: Secondary | ICD-10-CM

## 2023-11-09 DIAGNOSIS — L8941 Pressure ulcer of contiguous site of back, buttock and hip, stage 1: Secondary | ICD-10-CM

## 2023-11-09 NOTE — Progress Notes (Signed)
 Subjective:  Patient ID: Alicia Kelly, female    DOB: 11/18/1955  Age: 68 y.o. MRN: [id number]  No chief complaint on file.   Discussed the use of AI scribe software for clinical note transcription with the patient, who gave verbal consent to proceed.  History of Present Illness   Alicia Kelly is a 68 year old female with epilepsy who presents with a non-healing wound and a concern for a mass on her left side.  She has a non-healing wound on her buttock that has persisted for two to three years. Initially, the wound was deep, but it has been healing with the use of a Band-Aid and a device that 'sucks the infection out'. The wound is not as deep as before, but there is concern it might worsen. She has been using a different type of dressing that pulls the skin off, which she fears might exacerbate the wound. She would like send a prescription of Optifoam gentle lite 3/3 square. Wound care is managed with assistance from a company in [facility], and they take pictures of the wound for monitoring.  She is concerned about a mass on her left side, located in the left flank area. Initially thought to be part of her rib, she noticed it getting bigger over the last month. There is a family history of cancer, including colon cancer in both grandmothers and a sister with cancer on her leg. She is apprehensive about surgical intervention due to her slow healing process and past experiences with seizures.  She has a long-standing history of epilepsy, diagnosed since age 44, with multiple falls resulting in injuries, including two instances requiring head sutures. She has been advised against having children due to the risk of birth defects from her medication.          09/02/2023    9:57 AM 07/08/2023    2:04 PM 02/02/2023   10:45 AM 10/28/2022   10:41 AM 07/07/2022   10:31 AM  Depression screen PHQ 2/9  Decreased Interest 0 0 0 0 0  Down, Depressed, Hopeless 0 0 0 0 0  PHQ - 2 Score 0 0  0 0 0  Altered sleeping 0  0    Tired, decreased energy 0  0    Change in appetite 0  0    Feeling bad or failure about yourself  0  0    Trouble concentrating 0  0    Moving slowly or fidgety/restless 0  0    Suicidal thoughts 0  0    PHQ-9 Score 0  0    Difficult doing work/chores Not difficult at all            09/02/2023    9:56 AM  Fall Risk   Falls in the past year? 1  Number falls in past yr: 0  Injury with Fall? 0  Risk for fall due to : No Fall Risks;History of fall(s)  Follow up Falls evaluation completed    Patient Care Team: Nicholaus Credit, DEVONNA as PCP - General (Physician Assistant) Ezzard Valaria LABOR, MD as Consulting Physician (Hematology and Oncology) Scot Richardson SAUNDERS., MD as Referring Physician (Neurology)   Review of Systems  Constitutional:  Negative for chills, fatigue and fever.  HENT:  Negative for congestion, ear pain and sore throat.   Respiratory:  Negative for cough and shortness of breath.   Cardiovascular:  Negative for chest pain and palpitations.  Gastrointestinal:  Negative for abdominal pain, constipation,  diarrhea, nausea and vomiting.  Endocrine: Negative for polydipsia, polyphagia and polyuria.  Genitourinary:  Negative for difficulty urinating and dysuria.  Musculoskeletal:  Negative for arthralgias, back pain and myalgias.  Skin:  Positive for wound. Negative for rash.       Mass noted on the left flank area  Neurological:  Negative for headaches.  Psychiatric/Behavioral:  Negative for dysphoric mood. The patient is not nervous/anxious.     Current Outpatient Medications on File Prior to Visit  Medication Sig Dispense Refill   nystatin  (NYAMYC ) powder Apply topically.     nystatin  cream (MYCOSTATIN ) Apply topically.     Accu-Chek Softclix Lancets lancets TEST BLOOD SUGAR THREE TIMES DAILY 300 each 1   atorvastatin  (LIPITOR) 40 MG tablet TAKE 1 TABLET BY MOUTH EVERY DAY 90 tablet 1   Blood Glucose Calibration (ACCU-CHEK AVIVA) SOLN       Blood Glucose Monitoring Suppl (ACCU-CHEK AVIVA PLUS) w/Device KIT 1 each by Does not apply route 3 (three) times daily. 1 kit 0   clotrimazole -betamethasone  (LOTRISONE ) cream APPLY TOPICALLY TWICE A DAY 30 g 2   dapagliflozin  propanediol (FARXIGA ) 5 MG TABS tablet Take 1 tablet (5 mg total) by mouth daily before breakfast. 30 tablet 2   fluticasone  (FLONASE ) 50 MCG/ACT nasal spray Place 2 sprays into both nostrils daily.     glucose blood (ACCU-CHEK GUIDE TEST) test strip Use as instructed 100 each 12   insulin degludec  (TRESIBA  FLEXTOUCH) 100 UNIT/ML FlexTouch Pen Inject 40 Units into the skin daily.     Insulin Pen Needle (B-D UF III MINI PEN NEEDLES) 31G X 5 MM MISC USE 1 PEN NEEDLE DAILY 100 each 2   lacosamide  (VIMPAT ) 200 MG TABS tablet Take 1.5 tablets (300 mg total) by mouth 2 (two) times daily. (Patient taking differently: Take 200 mg by mouth 2 (two) times daily.) 180 tablet 2   levETIRAcetam  (KEPPRA ) 1000 MG tablet Take 1 tablet (1,000 mg total) by mouth in the morning, at noon, and at bedtime. 90 tablet 6   ondansetron  (ZOFRAN -ODT) 4 MG disintegrating tablet TAKE 1 TABLET BY MOUTH EVERY 8 HOURS AS NEEDED FOR NAUSEA AND VOMITING 90 tablet 0   pantoprazole  (PROTONIX ) 40 MG tablet TAKE 1 TABLET BY MOUTH TWICE A DAY 180 tablet 1   Semaglutide , 2 MG/DOSE, (OZEMPIC , 2 MG/DOSE,) 8 MG/3ML SOPN INJECT 2 MG INTO THE SKIN ONCE A WEEK. 3 mL 5   torsemide  (DEMADEX ) 10 MG tablet TAKE 1 TABLET BY MOUTH TWICE A DAY 180 tablet 1   Vitamin D , Ergocalciferol , (DRISDOL ) 1.25 MG (50000 UNIT) CAPS capsule TAKE 1 CAPSULE BY MOUTH TWICE A WEEK 24 capsule 1   No current facility-administered medications on file prior to visit.   Past Medical History:  Diagnosis Date   Asthma    Calculus of gallbladder with chronic cholecystitis without obstruction 03/22/2017   Diabetes (HCC)    Epilepsy (HCC)    Hypertension    Sleep apnea    Past Surgical History:  Procedure Laterality Date   APPENDECTOMY      CHOLECYSTECTOMY     TONSILLECTOMY      Family History  Problem Relation Age of Onset   Colon cancer Mother        d. 62   Hypertension Father    Diabetes Father    Allergic rhinitis Sister    Hypertension Sister    Cancer - Colon Maternal Aunt    Cancer Maternal Uncle    Colon cancer Maternal Grandmother  dx 27s   Colon cancer Paternal Grandmother        dx 60s   Diabetes Paternal Grandmother    Breast cancer Neg Hx    Social History   Socioeconomic History   Marital status: Widowed    Spouse name: Not on file   Number of children: 0   Years of education: Not on file   Highest education level: 10th grade  Occupational History   Occupation: Disabled  Tobacco Use   Smoking status: Never   Smokeless tobacco: Never  Vaping Use   Vaping status: Never Used  Substance and Sexual Activity   Alcohol  use: Never   Drug use: Never   Sexual activity: Not Currently  Other Topics Concern   Not on file  Social History Narrative   Spouse passed away 10 yrs ago -- no children   Social Drivers of Corporate investment banker Strain: Low Risk  (07/08/2023)   Overall Financial Resource Strain (CARDIA)    Difficulty of Paying Living Expenses: Not hard at all  Food Insecurity: No Food Insecurity (07/08/2023)   Hunger Vital Sign    Worried About Running Out of Food in the Last Year: Never true    Ran Out of Food in the Last Year: Never true  Transportation Needs: No Transportation Needs (07/08/2023)   PRAPARE - Administrator, Civil Service (Medical): No    Lack of Transportation (Non-Medical): No  Physical Activity: Sufficiently Active (07/08/2023)   Exercise Vital Sign    Days of Exercise per Week: 5 days    Minutes of Exercise per Session: 30 min  Stress: No Stress Concern Present (07/08/2023)   [employer] of Occupational Health - Occupational Stress Questionnaire    Feeling of Stress : Not at all  Social Connections: Socially Isolated (07/08/2023)    Social Connection and Isolation Panel    Frequency of Communication with Friends and Family: More than three times a week    Frequency of Social Gatherings with Friends and Family: Once a week    Attends Religious Services: Never    Database administrator or Organizations: No    Attends Banker Meetings: Never    Marital Status: Widowed    Objective:  BP 100/70   Pulse 76   Temp (!) 97.3 F (36.3 C)   Resp 16   Ht 5' 6 (1.676 m)   Wt 220 lb (99.8 kg)   LMP  (LMP Unknown)   SpO2 97%   BMI 35.51 kg/m      11/09/2023    9:51 AM 10/08/2023   11:45 AM 10/08/2023   11:44 AM  BP/Weight  Systolic BP 100 133 148  Diastolic BP 70 83 69  Wt. (Lbs) 220  219.9  BMI 35.51 kg/m2  35.49 kg/m2    Physical Exam Constitutional:      General: She is not in acute distress.    Appearance: Normal appearance. She is obese. She is not ill-appearing.   Eyes:     Conjunctiva/sclera: Conjunctivae normal.    Cardiovascular:     Rate and Rhythm: Normal rate and regular rhythm.     Heart sounds: Normal heart sounds. No murmur heard. Pulmonary:     Effort: Pulmonary effort is normal.     Breath sounds: Normal breath sounds. No wheezing.  Abdominal:     General: Bowel sounds are normal.     Palpations: Abdomen is soft.     Tenderness: There is no  abdominal tenderness.   Musculoskeletal:        General: Normal range of motion.   Skin:    General: Skin is warm.     Findings: Wound (covered) present.     Comments: Mass on the left flank area. See photo   Neurological:     Mental Status: She is alert. Mental status is at baseline.   Psychiatric:        Mood and Affect: Mood normal.        Behavior: Behavior normal.     Lab Results  Component Value Date   WBC 10.0 10/08/2023   HGB 11.4 (L) 10/08/2023   HCT 36.1 10/08/2023   PLT 445 (H) 10/08/2023   GLUCOSE 232 (H) 10/08/2023   CHOL 132 09/02/2023   TRIG 184 (H) 09/02/2023   HDL 49 09/02/2023   LDLCALC 53  09/02/2023   ALT 9 10/08/2023   AST 10 (L) 10/08/2023   NA 140 10/08/2023   K 3.8 10/08/2023   CL 103 10/08/2023   CREATININE 0.83 10/08/2023   BUN 19 10/08/2023   CO2 26 10/08/2023   TSH 0.969 09/02/2023   HGBA1C 8.5 (H) 09/02/2023   MICROALBUR 10 07/03/2020      Assessment & Plan:  Pressure injury of contiguous region involving back, buttock, and hip, stage 1, unspecified laterality Assessment & Plan: Chronic wound on buttocks improving with current regimen. Discussed concerns about adhesive bandage causing skin damage. - Send wound care supplies through [employer]. - Coordinate with wound care company in [facility] for wound monitoring.  Orders: -     For home use only DME Other see comment  Hip region mass, left Assessment & Plan: Mass likely a lipoma, but malignancy considered due to size increase and family cancer history. She prefers to avoid surgery unless necessary. - Order soft tissue ultrasound of the left flank. - Consider referral to general surgery based on ultrasound results.  Orders: -     US  LT LOWER EXTREM LTD SOFT TISSUE NON VASCULAR; Future  Complex partial seizures (HCC) Assessment & Plan: Current medical regimen effective - Managed epilepsy since age 39 with history of falls and injuries.  - Continue Keppra  1000 mg THREE TIMES A DAY and Vimpat  300 mg TWICE A DAY           No orders of the defined types were placed in this encounter.   Orders Placed This Encounter  Procedures   For home use only DME Other see comment   US  LT LOWER EXTREM LTD SOFT TISSUE NON VASCULAR     Follow-up: Return if symptoms worsen or fail to improve.   LILLETTE Kato I Leal-Borjas,acting as a scribe for Harrie CHRISTELLA Cedar, FNP.,have documented all relevant documentation on the behalf of Harrie CHRISTELLA Cedar, FNP,as directed by  Harrie CHRISTELLA Cedar, FNP while in the presence of Harrie CHRISTELLA Cedar, FNP.   An After Visit Summary was printed and given to the patient.  I attest that I  have reviewed this visit and agree with the plan scribed by my staff.   Harrie CHRISTELLA Cedar, FNP [facility] [phone]

## 2023-11-09 NOTE — Assessment & Plan Note (Signed)
 Mass likely a lipoma, but malignancy considered due to size increase and family cancer history. She prefers to avoid surgery unless necessary. - Order soft tissue ultrasound of the left flank. - Consider referral to general surgery based on ultrasound results.

## 2023-11-09 NOTE — Assessment & Plan Note (Signed)
 Current medical regimen effective - Managed epilepsy since age 68 with history of falls and injuries.  - Continue Keppra  1000 mg THREE TIMES A DAY and Vimpat  300 mg TWICE A DAY

## 2023-11-09 NOTE — Assessment & Plan Note (Signed)
 Chronic wound on buttocks improving with current regimen. Discussed concerns about adhesive bandage causing skin damage. - Send wound care supplies through [employer]. - Coordinate with wound care company in [facility] for wound monitoring.

## 2023-11-15 DIAGNOSIS — E1151 Type 2 diabetes mellitus with diabetic peripheral angiopathy without gangrene: Secondary | ICD-10-CM | POA: Diagnosis not present

## 2023-11-15 DIAGNOSIS — E782 Mixed hyperlipidemia: Secondary | ICD-10-CM | POA: Diagnosis not present

## 2023-11-15 DIAGNOSIS — K8020 Calculus of gallbladder without cholecystitis without obstruction: Secondary | ICD-10-CM | POA: Diagnosis not present

## 2023-11-15 DIAGNOSIS — E538 Deficiency of other specified B group vitamins: Secondary | ICD-10-CM | POA: Diagnosis not present

## 2023-11-15 DIAGNOSIS — I872 Venous insufficiency (chronic) (peripheral): Secondary | ICD-10-CM | POA: Diagnosis not present

## 2023-11-15 DIAGNOSIS — L89323 Pressure ulcer of left buttock, stage 3: Secondary | ICD-10-CM | POA: Diagnosis not present

## 2023-11-15 DIAGNOSIS — E559 Vitamin D deficiency, unspecified: Secondary | ICD-10-CM | POA: Diagnosis not present

## 2023-11-15 DIAGNOSIS — K2100 Gastro-esophageal reflux disease with esophagitis, without bleeding: Secondary | ICD-10-CM | POA: Diagnosis not present

## 2023-11-15 DIAGNOSIS — E1169 Type 2 diabetes mellitus with other specified complication: Secondary | ICD-10-CM | POA: Diagnosis not present

## 2023-11-15 DIAGNOSIS — J45909 Unspecified asthma, uncomplicated: Secondary | ICD-10-CM | POA: Diagnosis not present

## 2023-11-15 DIAGNOSIS — G4730 Sleep apnea, unspecified: Secondary | ICD-10-CM | POA: Diagnosis not present

## 2023-11-15 DIAGNOSIS — D508 Other iron deficiency anemias: Secondary | ICD-10-CM | POA: Diagnosis not present

## 2023-11-15 DIAGNOSIS — I10 Essential (primary) hypertension: Secondary | ICD-10-CM | POA: Diagnosis not present

## 2023-11-22 DIAGNOSIS — I10 Essential (primary) hypertension: Secondary | ICD-10-CM | POA: Diagnosis not present

## 2023-11-22 DIAGNOSIS — L89323 Pressure ulcer of left buttock, stage 3: Secondary | ICD-10-CM | POA: Diagnosis not present

## 2023-11-22 DIAGNOSIS — E538 Deficiency of other specified B group vitamins: Secondary | ICD-10-CM | POA: Diagnosis not present

## 2023-11-22 DIAGNOSIS — D508 Other iron deficiency anemias: Secondary | ICD-10-CM | POA: Diagnosis not present

## 2023-11-22 DIAGNOSIS — I872 Venous insufficiency (chronic) (peripheral): Secondary | ICD-10-CM | POA: Diagnosis not present

## 2023-11-22 DIAGNOSIS — K8020 Calculus of gallbladder without cholecystitis without obstruction: Secondary | ICD-10-CM | POA: Diagnosis not present

## 2023-11-22 DIAGNOSIS — E782 Mixed hyperlipidemia: Secondary | ICD-10-CM | POA: Diagnosis not present

## 2023-11-22 DIAGNOSIS — E559 Vitamin D deficiency, unspecified: Secondary | ICD-10-CM | POA: Diagnosis not present

## 2023-11-22 DIAGNOSIS — J45909 Unspecified asthma, uncomplicated: Secondary | ICD-10-CM | POA: Diagnosis not present

## 2023-11-22 DIAGNOSIS — K2100 Gastro-esophageal reflux disease with esophagitis, without bleeding: Secondary | ICD-10-CM | POA: Diagnosis not present

## 2023-11-22 DIAGNOSIS — E1151 Type 2 diabetes mellitus with diabetic peripheral angiopathy without gangrene: Secondary | ICD-10-CM | POA: Diagnosis not present

## 2023-11-22 DIAGNOSIS — E1169 Type 2 diabetes mellitus with other specified complication: Secondary | ICD-10-CM | POA: Diagnosis not present

## 2023-11-22 DIAGNOSIS — G4730 Sleep apnea, unspecified: Secondary | ICD-10-CM | POA: Diagnosis not present

## 2023-11-23 ENCOUNTER — Ambulatory Visit (HOSPITAL_BASED_OUTPATIENT_CLINIC_OR_DEPARTMENT_OTHER)
Admission: RE | Admit: 2023-11-23 | Discharge: 2023-11-23 | Disposition: A | Discharge status: Home / Self Care | Source: Ambulatory Visit | Attending: Family Medicine | Admitting: Family Medicine

## 2023-11-23 DIAGNOSIS — R2242 Localized swelling, mass and lump, left lower limb: Secondary | ICD-10-CM

## 2023-11-24 DIAGNOSIS — R9431 Abnormal electrocardiogram [ECG] [EKG]: Secondary | ICD-10-CM | POA: Diagnosis not present

## 2023-11-24 DIAGNOSIS — R569 Unspecified convulsions: Secondary | ICD-10-CM | POA: Diagnosis not present

## 2023-11-24 DIAGNOSIS — Z7985 Long-term (current) use of injectable non-insulin antidiabetic drugs: Secondary | ICD-10-CM | POA: Diagnosis not present

## 2023-11-24 DIAGNOSIS — R531 Weakness: Secondary | ICD-10-CM | POA: Diagnosis not present

## 2023-11-24 DIAGNOSIS — R6889 Other general symptoms and signs: Secondary | ICD-10-CM | POA: Diagnosis not present

## 2023-11-24 DIAGNOSIS — R739 Hyperglycemia, unspecified: Secondary | ICD-10-CM | POA: Diagnosis not present

## 2023-11-24 DIAGNOSIS — Z79899 Other long term (current) drug therapy: Secondary | ICD-10-CM | POA: Diagnosis not present

## 2023-11-24 DIAGNOSIS — R110 Nausea: Secondary | ICD-10-CM | POA: Diagnosis not present

## 2023-11-24 DIAGNOSIS — Z794 Long term (current) use of insulin: Secondary | ICD-10-CM | POA: Diagnosis not present

## 2023-11-25 DIAGNOSIS — R300 Dysuria: Secondary | ICD-10-CM | POA: Diagnosis not present

## 2023-11-25 DIAGNOSIS — J019 Acute sinusitis, unspecified: Secondary | ICD-10-CM | POA: Diagnosis not present

## 2023-11-25 DIAGNOSIS — R0982 Postnasal drip: Secondary | ICD-10-CM | POA: Diagnosis not present

## 2023-11-25 DIAGNOSIS — R0981 Nasal congestion: Secondary | ICD-10-CM | POA: Diagnosis not present

## 2023-11-25 DIAGNOSIS — N390 Urinary tract infection, site not specified: Secondary | ICD-10-CM | POA: Diagnosis not present

## 2023-11-28 ENCOUNTER — Other Ambulatory Visit: Payer: Self-pay | Admitting: Physician Assistant

## 2023-11-28 DIAGNOSIS — I10 Essential (primary) hypertension: Secondary | ICD-10-CM

## 2023-11-29 ENCOUNTER — Other Ambulatory Visit: Payer: Self-pay | Admitting: Physician Assistant

## 2023-11-30 ENCOUNTER — Other Ambulatory Visit (HOSPITAL_BASED_OUTPATIENT_CLINIC_OR_DEPARTMENT_OTHER): Admitting: Radiology

## 2023-11-30 DIAGNOSIS — D508 Other iron deficiency anemias: Secondary | ICD-10-CM | POA: Diagnosis not present

## 2023-11-30 DIAGNOSIS — I872 Venous insufficiency (chronic) (peripheral): Secondary | ICD-10-CM | POA: Diagnosis not present

## 2023-11-30 DIAGNOSIS — E538 Deficiency of other specified B group vitamins: Secondary | ICD-10-CM | POA: Diagnosis not present

## 2023-11-30 DIAGNOSIS — I10 Essential (primary) hypertension: Secondary | ICD-10-CM | POA: Diagnosis not present

## 2023-11-30 DIAGNOSIS — E782 Mixed hyperlipidemia: Secondary | ICD-10-CM | POA: Diagnosis not present

## 2023-11-30 DIAGNOSIS — K8020 Calculus of gallbladder without cholecystitis without obstruction: Secondary | ICD-10-CM | POA: Diagnosis not present

## 2023-11-30 DIAGNOSIS — G4730 Sleep apnea, unspecified: Secondary | ICD-10-CM | POA: Diagnosis not present

## 2023-11-30 DIAGNOSIS — K2100 Gastro-esophageal reflux disease with esophagitis, without bleeding: Secondary | ICD-10-CM | POA: Diagnosis not present

## 2023-11-30 DIAGNOSIS — E559 Vitamin D deficiency, unspecified: Secondary | ICD-10-CM | POA: Diagnosis not present

## 2023-11-30 DIAGNOSIS — J45909 Unspecified asthma, uncomplicated: Secondary | ICD-10-CM | POA: Diagnosis not present

## 2023-11-30 DIAGNOSIS — L89323 Pressure ulcer of left buttock, stage 3: Secondary | ICD-10-CM | POA: Diagnosis not present

## 2023-11-30 DIAGNOSIS — E1169 Type 2 diabetes mellitus with other specified complication: Secondary | ICD-10-CM | POA: Diagnosis not present

## 2023-11-30 DIAGNOSIS — E1151 Type 2 diabetes mellitus with diabetic peripheral angiopathy without gangrene: Secondary | ICD-10-CM | POA: Diagnosis not present

## 2023-12-02 ENCOUNTER — Other Ambulatory Visit: Payer: Self-pay | Admitting: Physician Assistant

## 2023-12-02 ENCOUNTER — Ambulatory Visit (HOSPITAL_BASED_OUTPATIENT_CLINIC_OR_DEPARTMENT_OTHER)
Admission: RE | Admit: 2023-12-02 | Discharge: 2023-12-02 | Disposition: A | Discharge status: Home / Self Care | Source: Ambulatory Visit | Attending: Family Medicine | Admitting: Family Medicine

## 2023-12-02 DIAGNOSIS — R2242 Localized swelling, mass and lump, left lower limb: Secondary | ICD-10-CM

## 2023-12-02 DIAGNOSIS — E119 Type 2 diabetes mellitus without complications: Secondary | ICD-10-CM

## 2023-12-02 DIAGNOSIS — R1900 Intra-abdominal and pelvic swelling, mass and lump, unspecified site: Secondary | ICD-10-CM | POA: Diagnosis not present

## 2023-12-03 ENCOUNTER — Ambulatory Visit: Admitting: Physician Assistant

## 2023-12-06 ENCOUNTER — Encounter: Payer: Self-pay | Admitting: Oncology

## 2023-12-06 DIAGNOSIS — J45909 Unspecified asthma, uncomplicated: Secondary | ICD-10-CM | POA: Diagnosis not present

## 2023-12-06 DIAGNOSIS — G4730 Sleep apnea, unspecified: Secondary | ICD-10-CM | POA: Diagnosis not present

## 2023-12-06 DIAGNOSIS — E538 Deficiency of other specified B group vitamins: Secondary | ICD-10-CM | POA: Diagnosis not present

## 2023-12-06 DIAGNOSIS — E559 Vitamin D deficiency, unspecified: Secondary | ICD-10-CM | POA: Diagnosis not present

## 2023-12-06 DIAGNOSIS — I872 Venous insufficiency (chronic) (peripheral): Secondary | ICD-10-CM | POA: Diagnosis not present

## 2023-12-06 DIAGNOSIS — K2100 Gastro-esophageal reflux disease with esophagitis, without bleeding: Secondary | ICD-10-CM | POA: Diagnosis not present

## 2023-12-06 DIAGNOSIS — E1151 Type 2 diabetes mellitus with diabetic peripheral angiopathy without gangrene: Secondary | ICD-10-CM | POA: Diagnosis not present

## 2023-12-06 DIAGNOSIS — L89323 Pressure ulcer of left buttock, stage 3: Secondary | ICD-10-CM | POA: Diagnosis not present

## 2023-12-06 DIAGNOSIS — E782 Mixed hyperlipidemia: Secondary | ICD-10-CM | POA: Diagnosis not present

## 2023-12-06 DIAGNOSIS — D508 Other iron deficiency anemias: Secondary | ICD-10-CM | POA: Diagnosis not present

## 2023-12-06 DIAGNOSIS — E1169 Type 2 diabetes mellitus with other specified complication: Secondary | ICD-10-CM | POA: Diagnosis not present

## 2023-12-06 DIAGNOSIS — I10 Essential (primary) hypertension: Secondary | ICD-10-CM | POA: Diagnosis not present

## 2023-12-06 DIAGNOSIS — K8020 Calculus of gallbladder without cholecystitis without obstruction: Secondary | ICD-10-CM | POA: Diagnosis not present

## 2023-12-09 ENCOUNTER — Ambulatory Visit: Payer: Self-pay | Admitting: Family Medicine

## 2023-12-09 ENCOUNTER — Ambulatory Visit: Admitting: Physician Assistant

## 2023-12-14 DIAGNOSIS — E1151 Type 2 diabetes mellitus with diabetic peripheral angiopathy without gangrene: Secondary | ICD-10-CM | POA: Diagnosis not present

## 2023-12-14 DIAGNOSIS — E782 Mixed hyperlipidemia: Secondary | ICD-10-CM | POA: Diagnosis not present

## 2023-12-14 DIAGNOSIS — E538 Deficiency of other specified B group vitamins: Secondary | ICD-10-CM | POA: Diagnosis not present

## 2023-12-14 DIAGNOSIS — I872 Venous insufficiency (chronic) (peripheral): Secondary | ICD-10-CM | POA: Diagnosis not present

## 2023-12-14 DIAGNOSIS — G4730 Sleep apnea, unspecified: Secondary | ICD-10-CM | POA: Diagnosis not present

## 2023-12-14 DIAGNOSIS — J45909 Unspecified asthma, uncomplicated: Secondary | ICD-10-CM | POA: Diagnosis not present

## 2023-12-14 DIAGNOSIS — L89323 Pressure ulcer of left buttock, stage 3: Secondary | ICD-10-CM | POA: Diagnosis not present

## 2023-12-14 DIAGNOSIS — I10 Essential (primary) hypertension: Secondary | ICD-10-CM | POA: Diagnosis not present

## 2023-12-14 DIAGNOSIS — D508 Other iron deficiency anemias: Secondary | ICD-10-CM | POA: Diagnosis not present

## 2023-12-14 DIAGNOSIS — E1169 Type 2 diabetes mellitus with other specified complication: Secondary | ICD-10-CM | POA: Diagnosis not present

## 2023-12-14 DIAGNOSIS — E559 Vitamin D deficiency, unspecified: Secondary | ICD-10-CM | POA: Diagnosis not present

## 2023-12-14 DIAGNOSIS — K8020 Calculus of gallbladder without cholecystitis without obstruction: Secondary | ICD-10-CM | POA: Diagnosis not present

## 2023-12-14 DIAGNOSIS — K2100 Gastro-esophageal reflux disease with esophagitis, without bleeding: Secondary | ICD-10-CM | POA: Diagnosis not present

## 2023-12-16 ENCOUNTER — Encounter: Payer: Self-pay | Admitting: Physician Assistant

## 2023-12-16 ENCOUNTER — Ambulatory Visit (INDEPENDENT_AMBULATORY_CARE_PROVIDER_SITE_OTHER): Admitting: Physician Assistant

## 2023-12-16 ENCOUNTER — Telehealth: Payer: Self-pay

## 2023-12-16 VITALS — BP 120/70 | HR 78 | Temp 97.3°F | Resp 16 | Ht 66.0 in | Wt 223.0 lb

## 2023-12-16 DIAGNOSIS — Z794 Long term (current) use of insulin: Secondary | ICD-10-CM

## 2023-12-16 DIAGNOSIS — E119 Type 2 diabetes mellitus without complications: Secondary | ICD-10-CM

## 2023-12-16 DIAGNOSIS — E785 Hyperlipidemia, unspecified: Secondary | ICD-10-CM | POA: Diagnosis not present

## 2023-12-16 DIAGNOSIS — I10 Essential (primary) hypertension: Secondary | ICD-10-CM | POA: Diagnosis not present

## 2023-12-16 DIAGNOSIS — K2100 Gastro-esophageal reflux disease with esophagitis, without bleeding: Secondary | ICD-10-CM

## 2023-12-16 DIAGNOSIS — E1169 Type 2 diabetes mellitus with other specified complication: Secondary | ICD-10-CM

## 2023-12-16 DIAGNOSIS — G40309 Generalized idiopathic epilepsy and epileptic syndromes, not intractable, without status epilepticus: Secondary | ICD-10-CM | POA: Diagnosis not present

## 2023-12-16 DIAGNOSIS — L89322 Pressure ulcer of left buttock, stage 2: Secondary | ICD-10-CM | POA: Diagnosis not present

## 2023-12-16 DIAGNOSIS — N959 Unspecified menopausal and perimenopausal disorder: Secondary | ICD-10-CM | POA: Insufficient documentation

## 2023-12-16 DIAGNOSIS — R600 Localized edema: Secondary | ICD-10-CM

## 2023-12-16 DIAGNOSIS — Z1231 Encounter for screening mammogram for malignant neoplasm of breast: Secondary | ICD-10-CM | POA: Diagnosis not present

## 2023-12-16 DIAGNOSIS — E559 Vitamin D deficiency, unspecified: Secondary | ICD-10-CM

## 2023-12-16 DIAGNOSIS — I152 Hypertension secondary to endocrine disorders: Secondary | ICD-10-CM

## 2023-12-16 MED ORDER — ACCU-CHEK GUIDE TEST VI STRP: 1.0000 | ORAL_STRIP | Freq: Three times a day (TID) | 12 refills | Status: AC

## 2023-12-16 MED ORDER — LACOSAMIDE 200 MG PO TABS: 300.0000 mg | ORAL_TABLET | Freq: Two times a day (BID) | ORAL | Status: AC

## 2023-12-16 NOTE — Telephone Encounter (Signed)
 Called [facility] and spoke with with the manager made her aware that provider was made aware by patient that they will be discharging her and provider stated that she has edema bad in both lower legs and they don't have blister yet, but they do need to be compression wrap every two weeks and if the blister come back that she will need uno boots every two weeks.  Manager of [facility] stated that she believe the patient insurance is not paying after August 5 th she is going to check on this and call office back.

## 2023-12-16 NOTE — Progress Notes (Signed)
 Subjective:  Patient ID: Alicia Kelly, female    DOB: 11/06/1955  Age: 68 y.o. MRN: [id number]  Chief Complaint  Patient presents with   Follow-up - chronic   Urinary Tract Infection    HPI Alicia Kelly has a history of type 2 Diabetes Mellitus for more than 5 years. Current treatment includes ozempic  2mg  weekly , farxiga  5mg  qd and tresiba  35 units qd -   She denies hypoglycemic episodes. She states blood glucose levels at home range from 150s to 200s. She is trying to watch her diet but due to swelling in legs it is hard for her to exercise . She has been treated by wound center for swelling in legs and feet but states that care will be ending soon.  For several months she has gotten unna boots and compression wraps on lower legs twice weekly - will reach out to them to reinstate orders for her  Mixed hyperlipidemia  Pt presents with hyperlipidemia. Compliance with treatment has been fair - The patient is compliant with medications, and currently taking lipitor 40mg  qd Tries to watch diet  Pt with diagnosis of generalized epilepsy - currently following with neurology and had a video visit recently - she states she has not had recent seizure activity since adjusting medications Taking vimpat  and keppra   Pt presents for follow up of hypertension.   Compliance with treatment has been good; hyzaar was stopped several months ago  She is also taking demadex  10mg  bid for leg edema  Pt with GERD - taking protonix  40mg  qd - states controls symptoms well  Pt with Vit D def - is taking weekly supplement - due for labwork  Pt is due for mammogram and dexa scan - would like to schedule  Pt continues to have swelling of lower legs with no weeping at this time but can see skin is thinning and possibly blisters will arise soon.  She states wound center last wrapped legs last week and now swelling occurring again and worsening Will reach out to get orders to continue with compression  wraps for swelling and unna boots as needed when blistering occurs  Lab Results  Component Value Date   HGBA1C 8.5 (H) 09/02/2023   HGBA1C 9.5 (H) 05/06/2023   HGBA1C 8.4 (H) 02/02/2023        09/02/2023    9:57 AM 07/08/2023    2:04 PM 02/02/2023   10:45 AM 10/28/2022   10:41 AM 07/07/2022   10:31 AM  Depression screen PHQ 2/9  Decreased Interest 0 0 0 0 0  Down, Depressed, Hopeless 0 0 0 0 0  PHQ - 2 Score 0 0 0 0 0  Altered sleeping 0  0    Tired, decreased energy 0  0    Change in appetite 0  0    Feeling bad or failure about yourself  0  0    Trouble concentrating 0  0    Moving slowly or fidgety/restless 0  0    Suicidal thoughts 0  0    PHQ-9 Score 0  0    Difficult doing work/chores Not difficult at all            07/07/2022   10:35 AM 10/28/2022   10:41 AM 02/02/2023   10:44 AM 07/08/2023    2:04 PM 09/02/2023    9:56 AM  Fall Risk  Falls in the past year? 0 1 1 1 1   Was there an injury with Fall?  0 1 1 0 0  Fall Risk Category Calculator 0 2 3 2 1   Patient at Risk for Falls Due to No Fall Risks History of fall(s);No Fall Risks   No Fall Risks;History of fall(s)  Fall risk Follow up Falls evaluation completed;Education provided Falls evaluation completed Falls evaluation completed  Falls evaluation completed    CONSTITUTIONAL: Negative for chills, fatigue, fever, unintentional weight gain and unintentional weight loss.  E/N/T: Negative for ear pain, nasal congestion and sore throat.  CARDIOVASCULAR: Negative for chest pain, dizziness, palpitations - has chronic leg edema RESPIRATORY: Negative for recent cough and dyspnea.  GASTROINTESTINAL: Negative for abdominal pain, acid reflux symptoms, constipation, diarrhea, nausea and vomiting.  MSK: Negative for arthralgias and myalgias.  INTEGUMENTARY: see HPI  NEUROLOGICAL: Negative for dizziness and headaches.  PSYCHIATRIC: Negative for sleep disturbance and to question depression screen.  Negative for depression,  negative for anhedonia.       Current Outpatient Medications:    Accu-Chek Softclix Lancets lancets, TEST BLOOD SUGAR THREE TIMES DAILY, Disp: 300 each, Rfl: 1   atorvastatin  (LIPITOR) 40 MG tablet, TAKE 1 TABLET BY MOUTH EVERY DAY, Disp: 90 tablet, Rfl: 1   Blood Glucose Calibration (ACCU-CHEK AVIVA) SOLN, , Disp: , Rfl:    Blood Glucose Monitoring Suppl (ACCU-CHEK AVIVA PLUS) w/Device KIT, 1 each by Does not apply route 3 (three) times daily., Disp: 1 kit, Rfl: 0   clotrimazole -betamethasone  (LOTRISONE ) cream, APPLY TOPICALLY TWICE A DAY, Disp: 30 g, Rfl: 2   dapagliflozin  propanediol (FARXIGA ) 5 MG TABS tablet, Take 1 tablet (5 mg total) by mouth daily before breakfast., Disp: 30 tablet, Rfl: 2   fluticasone  (FLONASE ) 50 MCG/ACT nasal spray, Place 2 sprays into both nostrils daily., Disp: , Rfl:    insulin degludec  (TRESIBA  FLEXTOUCH) 100 UNIT/ML FlexTouch Pen, Inject 40 Units into the skin daily., Disp: , Rfl:    Insulin Pen Needle (B-D UF III MINI PEN NEEDLES) 31G X 5 MM MISC, USE 1 PEN NEEDLE DAILY, Disp: 100 each, Rfl: 2   levETIRAcetam  (KEPPRA ) 1000 MG tablet, Take 1 tablet (1,000 mg total) by mouth in the morning, at noon, and at bedtime., Disp: 90 tablet, Rfl: 6   nystatin  (NYAMYC ) powder, Apply topically., Disp: , Rfl:    nystatin  cream (MYCOSTATIN ), Apply topically., Disp: , Rfl:    ondansetron  (ZOFRAN -ODT) 4 MG disintegrating tablet, TAKE 1 TABLET BY MOUTH EVERY 8 HOURS AS NEEDED FOR NAUSEA AND VOMITING, Disp: 90 tablet, Rfl: 0   pantoprazole  (PROTONIX ) 40 MG tablet, TAKE 1 TABLET BY MOUTH TWICE A DAY, Disp: 180 tablet, Rfl: 1   Semaglutide , 2 MG/DOSE, (OZEMPIC , 2 MG/DOSE,) 8 MG/3ML SOPN, INJECT 2 MG INTO THE SKIN ONCE A WEEK., Disp: 3 mL, Rfl: 1   torsemide  (DEMADEX ) 10 MG tablet, TAKE 1 TABLET BY MOUTH TWICE A DAY, Disp: 180 tablet, Rfl: 1   Vitamin D , Ergocalciferol , (DRISDOL ) 1.25 MG (50000 UNIT) CAPS capsule, TAKE 1 CAPSULE BY MOUTH TWICE A WEEK, Disp: 24 capsule, Rfl: 1    glucose blood (ACCU-CHEK GUIDE TEST) test strip, 1 each by Other route 3 (three) times daily. Use as instructed, Disp: 100 each, Rfl: 12   lacosamide  (VIMPAT ) 200 MG TABS tablet, Take 1.5 tablets (300 mg total) by mouth 2 (two) times daily., Disp: , Rfl:   Past Medical History:  Diagnosis Date   Asthma    Calculus of gallbladder with chronic cholecystitis without obstruction 03/22/2017   Diabetes (HCC)    Epilepsy (HCC)    Hypertension  Sleep apnea    Objective:  PHYSICAL EXAM:   VS: BP 120/70   Pulse 78   Temp (!) 97.3 F (36.3 C)   Resp 16   Ht 5' 6 (1.676 m)   Wt 223 lb (101.2 kg)   LMP  (LMP Unknown)   SpO2 98%   BMI 35.99 kg/m   GEN: Well nourished, well developed, in no acute distress   Cardiac: RRR; no murmurs, rubs, or gallops, - 3+edema noted bilaterally - no blisters Respiratory:  normal respiratory rate and pattern with no distress - normal breath sounds with no rales, rhonchi, wheezes or rubs  MS: no deformity or atrophy  Skin: warm and dry, wound on buttock covered with small bandage Neuro:  Alert and Oriented x 3, - CN II-Xii grossly intact Psych: euthymic mood, appropriate affect and demeanor  Assessment & Plan:  Essential hypertension Stable off medication Watch diet/salt restrictions  Bilateral leg edema Continue torsemide  Will reach out to home health to continue compression wraps  Vitamin D  deficiency -     VITAMIN D  25 Hydroxy (Vit-D Deficiency, Fractures) Labwork pending Insulin dependent type 2 diabetes mellitus (HCC) -     CBC with Differential/Platelet -     Comprehensive metabolic panel -     TSH -     Lipid panel -     Hemoglobin A1c Continue current meds Generalized epilepsy (HCC) Continue meds and follow up with neurology as directed Labwork pending Hyperlipidemia associated with type 2 diabetes mellitus (HCC) -     Comprehensive metabolic panel -     Lipid panel Continue med Gastroesophageal reflux disease with esophagitis  without hemorrhage Continue med  Pressure injury of left buttock stage 2 (HCC) Continue management with wound center  Encounter for screening mammogram for breast cancer -     3D Screening Mammogram, Left and Right; Future  Other specified menopausal and perimenopausal disorders -     DG Bone Density; Future  Obesity, diabetes, hypertension syndrome (HCC) Continue current management as above      Follow-up: Return in about 4 months (around 04/16/2024) for chronic fasting follow-up.  An After Visit Summary was printed and given to the patient.  Alicia JONELLE NICHOLAUS DEVON[facility] [phone]

## 2023-12-16 NOTE — Addendum Note (Signed)
 Addended by: NICHOLAUS CREDIT on: 12/16/2023 02:15 PM   Modules accepted: Level of Service

## 2023-12-16 NOTE — Telephone Encounter (Signed)
 Notify pt that home health will only come out again if she gets blisters - notify them when that occurs Pt with chronic edema/lymphedema - compression socks will not resolve nor will watching her diet -- this will be a chronic recurrent problem for her

## 2023-12-16 NOTE — Telephone Encounter (Signed)
 C. Buren RN, called back and stated that they have been teaching patient to change her diet and teaching her how to use compression socks, and spouse has been there and stayed he would help her with the compression socks, patient doesn't  comply with diet, and as of now she is heal and they have been treating her since 2024,and they will continue with the discharge on 12/21/23 and if she does get sores again they will be happy to treat her.

## 2023-12-19 DIAGNOSIS — E1169 Type 2 diabetes mellitus with other specified complication: Secondary | ICD-10-CM | POA: Diagnosis not present

## 2023-12-19 DIAGNOSIS — E538 Deficiency of other specified B group vitamins: Secondary | ICD-10-CM | POA: Diagnosis not present

## 2023-12-19 DIAGNOSIS — L89323 Pressure ulcer of left buttock, stage 3: Secondary | ICD-10-CM | POA: Diagnosis not present

## 2023-12-19 DIAGNOSIS — J45909 Unspecified asthma, uncomplicated: Secondary | ICD-10-CM | POA: Diagnosis not present

## 2023-12-19 DIAGNOSIS — E1151 Type 2 diabetes mellitus with diabetic peripheral angiopathy without gangrene: Secondary | ICD-10-CM | POA: Diagnosis not present

## 2023-12-19 DIAGNOSIS — I872 Venous insufficiency (chronic) (peripheral): Secondary | ICD-10-CM | POA: Diagnosis not present

## 2023-12-19 DIAGNOSIS — E782 Mixed hyperlipidemia: Secondary | ICD-10-CM | POA: Diagnosis not present

## 2023-12-19 DIAGNOSIS — K2100 Gastro-esophageal reflux disease with esophagitis, without bleeding: Secondary | ICD-10-CM | POA: Diagnosis not present

## 2023-12-19 DIAGNOSIS — K8020 Calculus of gallbladder without cholecystitis without obstruction: Secondary | ICD-10-CM | POA: Diagnosis not present

## 2023-12-19 DIAGNOSIS — I10 Essential (primary) hypertension: Secondary | ICD-10-CM | POA: Diagnosis not present

## 2023-12-19 DIAGNOSIS — D508 Other iron deficiency anemias: Secondary | ICD-10-CM | POA: Diagnosis not present

## 2023-12-19 DIAGNOSIS — G4730 Sleep apnea, unspecified: Secondary | ICD-10-CM | POA: Diagnosis not present

## 2023-12-19 DIAGNOSIS — E559 Vitamin D deficiency, unspecified: Secondary | ICD-10-CM | POA: Diagnosis not present

## 2023-12-19 LAB — COMPREHENSIVE METABOLIC PANEL WITH GFR
ALT: 18 IU/L (ref 0–32)
AST: 12 IU/L (ref 0–40)
Albumin: 4.2 g/dL (ref 3.9–4.9)
Alkaline Phosphatase: 95 IU/L (ref 44–121)
BUN/Creatinine Ratio: 25 (ref 12–28)
BUN: 17 mg/dL (ref 8–27)
Bilirubin Total: 0.3 mg/dL (ref 0.0–1.2)
CO2: 22 mmol/L (ref 20–29)
Calcium: 9.9 mg/dL (ref 8.7–10.3)
Chloride: 99 mmol/L (ref 96–106)
Creatinine, Ser: 0.68 mg/dL (ref 0.57–1.00)
Globulin, Total: 2.8 g/dL (ref 1.5–4.5)
Glucose: 207 mg/dL — ABNORMAL HIGH (ref 70–99)
Potassium: 4.2 mmol/L (ref 3.5–5.2)
Sodium: 141 mmol/L (ref 134–144)
Total Protein: 7 g/dL (ref 6.0–8.5)
eGFR: 95 mL/min/1.73 (ref >59)

## 2023-12-19 LAB — CBC WITH DIFFERENTIAL/PLATELET
Basophils Absolute: 0 x10E3/uL (ref 0.0–0.2)
Basos: 0 %
EOS (ABSOLUTE): 0.3 x10E3/uL (ref 0.0–0.4)
Eos: 3 %
Hematocrit: 39.6 % (ref 34.0–46.6)
Hemoglobin: 12.7 g/dL (ref 11.1–15.9)
Immature Grans (Abs): 0 x10E3/uL (ref 0.0–0.1)
Immature Granulocytes: 0 %
Lymphocytes Absolute: 1.6 x10E3/uL (ref 0.7–3.1)
Lymphs: 14 %
MCH: 29.6 pg (ref 26.6–33.0)
MCHC: 32.1 g/dL (ref 31.5–35.7)
MCV: 92 fL (ref 79–97)
Monocytes Absolute: 0.9 x10E3/uL (ref 0.1–0.9)
Monocytes: 7 %
Neutrophils Absolute: 8.6 x10E3/uL — ABNORMAL HIGH (ref 1.4–7.0)
Neutrophils: 76 %
Platelets: 490 x10E3/uL — ABNORMAL HIGH (ref 150–450)
RBC: 4.29 x10E6/uL (ref 3.77–5.28)
RDW: 12.7 % (ref 11.7–15.4)
WBC: 11.5 x10E3/uL — ABNORMAL HIGH (ref 3.4–10.8)

## 2023-12-19 LAB — HEMOGLOBIN A1C
Est. average glucose Bld gHb Est-mCnc: 206 mg/dL
Hgb A1c MFr Bld: 8.8 % — ABNORMAL HIGH (ref 4.8–5.6)

## 2023-12-19 LAB — TSH: TSH: 0.774 u[IU]/mL (ref 0.450–4.500)

## 2023-12-19 LAB — LIPID PANEL
Chol/HDL Ratio: 2.8 ratio (ref 0.0–4.4)
Cholesterol, Total: 152 mg/dL (ref 100–199)
HDL: 54 mg/dL (ref >39)
LDL Chol Calc (NIH): 70 mg/dL (ref 0–99)
Triglycerides: 164 mg/dL — ABNORMAL HIGH (ref 0–149)
VLDL Cholesterol Cal: 28 mg/dL (ref 5–40)

## 2023-12-19 LAB — LACOSAMIDE: Lacosamide: 23 ug/mL — ABNORMAL HIGH (ref 5.0–10.0)

## 2023-12-19 LAB — VITAMIN D 25 HYDROXY (VIT D DEFICIENCY, FRACTURES): Vit D, 25-Hydroxy: 66.8 ng/mL (ref 30.0–100.0)

## 2023-12-19 LAB — LEVETIRACETAM LEVEL: Levetiracetam Lvl: 57.6 ug/mL — ABNORMAL HIGH (ref 10.0–40.0)

## 2023-12-20 ENCOUNTER — Ambulatory Visit: Payer: Self-pay | Admitting: Physician Assistant

## 2023-12-20 ENCOUNTER — Other Ambulatory Visit: Payer: Self-pay | Admitting: Physician Assistant

## 2023-12-20 DIAGNOSIS — E119 Type 2 diabetes mellitus without complications: Secondary | ICD-10-CM

## 2023-12-20 MED ORDER — TRESIBA FLEXTOUCH 100 UNIT/ML ~~LOC~~ SOPN: 45.0000 [IU] | PEN_INJECTOR | Freq: Every day | SUBCUTANEOUS | Status: DC

## 2023-12-23 ENCOUNTER — Other Ambulatory Visit: Payer: Self-pay | Admitting: Physician Assistant

## 2023-12-23 ENCOUNTER — Other Ambulatory Visit: Payer: Self-pay

## 2023-12-23 ENCOUNTER — Telehealth: Payer: Self-pay

## 2023-12-23 DIAGNOSIS — E119 Type 2 diabetes mellitus without complications: Secondary | ICD-10-CM

## 2023-12-23 DIAGNOSIS — R600 Localized edema: Secondary | ICD-10-CM

## 2023-12-23 MED ORDER — TRESIBA FLEXTOUCH 100 UNIT/ML ~~LOC~~ SOPN: 45.0000 [IU] | PEN_INJECTOR | Freq: Every day | SUBCUTANEOUS | 1 refills | Status: DC

## 2023-12-23 MED ORDER — DAPAGLIFLOZIN PROPANEDIOL 5 MG PO TABS: 5.0000 mg | ORAL_TABLET | Freq: Every day | ORAL | 2 refills | Status: AC

## 2023-12-23 MED ORDER — TORSEMIDE 10 MG PO TABS: 10.0000 mg | ORAL_TABLET | Freq: Two times a day (BID) | ORAL | 1 refills | Status: DC

## 2023-12-23 NOTE — Telephone Encounter (Signed)
 Please advice. Copied from CRM [id number]. Topic: Clinical - Medical Advice >> Dec 23, 2023  2:00 PM Thliyah D wrote: No one has called to come back and wrap her legs

## 2023-12-23 NOTE — Telephone Encounter (Signed)
 Notify pt that we called home health and they stated that unless her legs were weeping and open again they were not going to come back out.   If she is having weeping again we can send her to wound care again at hospital to start treatment again or we can reach back out to home health

## 2023-12-23 NOTE — Telephone Encounter (Signed)
 Called patient and let her know about what home health said. She states that her legs are not weeping, and they are not too swelling to go to wound care. She will call back if her legs are worse.

## 2023-12-24 ENCOUNTER — Other Ambulatory Visit: Payer: Self-pay | Admitting: Physician Assistant

## 2023-12-24 DIAGNOSIS — B354 Tinea corporis: Secondary | ICD-10-CM

## 2024-01-08 ENCOUNTER — Other Ambulatory Visit: Payer: Self-pay | Admitting: Physician Assistant

## 2024-01-08 DIAGNOSIS — B354 Tinea corporis: Secondary | ICD-10-CM

## 2024-01-17 DIAGNOSIS — R0981 Nasal congestion: Secondary | ICD-10-CM | POA: Diagnosis not present

## 2024-01-17 DIAGNOSIS — R300 Dysuria: Secondary | ICD-10-CM | POA: Diagnosis not present

## 2024-01-17 DIAGNOSIS — R051 Acute cough: Secondary | ICD-10-CM | POA: Diagnosis not present

## 2024-01-17 DIAGNOSIS — R070 Pain in throat: Secondary | ICD-10-CM | POA: Diagnosis not present

## 2024-01-22 ENCOUNTER — Other Ambulatory Visit: Payer: Self-pay | Admitting: Physician Assistant

## 2024-01-22 DIAGNOSIS — E119 Type 2 diabetes mellitus without complications: Secondary | ICD-10-CM

## 2024-02-07 NOTE — Progress Notes (Unsigned)
 [facility]  [address] [place],  [place]  [address] [phone]  Clinic Day: 10/08/2023  Referring physician: Nicholaus Credit, PA-C  HISTORY OF PRESENT ILLNESS:  The patient is a 68 y.o. female  with leukocytosis.  Furthermore, she has been deficient in iron , vitamin B12, and folic acid  to where they had to be replaced.  Of note, she last received IV iron  in August/September 2024.  She comes in today to reassess her peripheral counts and her vitamin levels.  Since her last visit, the patient has been doing okay.  However, she recently went to [facility] where large polyps in her stomach had to be removed.  Due to areas of bleeding in her stomach, clamps had to be placed.  Since her GI procedure, the patient claims to be doing fairly well.  She denies having increased fatigue or any overt forms of GI blood loss which concern her progressive anemia.  PHYSICAL EXAM:  There were no vitals taken for this visit. Wt Readings from Last 3 Encounters:  12/16/23 223 lb (101.2 kg)  11/09/23 220 lb (99.8 kg)  10/08/23 219 lb 14.4 oz (99.7 kg)   There is no height or weight on file to calculate BMI. Performance status (ECOG): 2 - Symptomatic, <50% confined to bed Physical Exam Constitutional:      Appearance: Normal appearance. She is not ill-appearing.  HENT:     Mouth/Throat:     Mouth: Mucous membranes are moist.     Pharynx: Oropharynx is clear. No oropharyngeal exudate or posterior oropharyngeal erythema.  Cardiovascular:     Rate and Rhythm: Normal rate and regular rhythm.     Heart sounds: No murmur heard.    No friction rub. No gallop.  Pulmonary:     Effort: Pulmonary effort is normal. No respiratory distress.     Breath sounds: Normal breath sounds. No wheezing, rhonchi or rales.  Abdominal:     General: Bowel sounds are normal. There is no distension.     Palpations: Abdomen is soft. There is no mass.     Tenderness: There is no abdominal tenderness.   Musculoskeletal:        General: No swelling.     Right lower leg: No edema.     Left lower leg: No edema.  Lymphadenopathy:     Cervical: No cervical adenopathy.     Upper Body:     Right upper body: No supraclavicular or axillary adenopathy.     Left upper body: No supraclavicular or axillary adenopathy.     Lower Body: No right inguinal adenopathy. No left inguinal adenopathy.  Skin:    General: Skin is warm.     Coloration: Skin is not jaundiced.     Findings: No lesion or rash.  Neurological:     General: No focal deficit present.     Mental Status: She is alert and oriented to person, place, and time. Mental status is at baseline.  Psychiatric:        Mood and Affect: Mood normal.        Behavior: Behavior normal.        Thought Content: Thought content normal.    LABS:    Latest Reference Range & Units 10/08/23 10:54  WBC 4.0 - 10.5 K/uL 10.0  RBC 3.87 - 5.11 MIL/uL 4.05  Hemoglobin 12.0 - 15.0 g/dL 88.5 (L)  HCT 63.9 - 53.9 % 36.1  MCV 80.0 - 100.0 fL 89.1  MCH 26.0 - 34.0  pg 28.1  MCHC 30.0 - 36.0 g/dL 68.3  RDW 88.4 - 84.4 % 13.5  Platelets 150 - 400 K/uL 445 (H)  nRBC 0.0 - 0.2 % 0.0  Neutrophils % 75  Lymphocytes % 16  Monocytes Relative % 7  Eosinophil % 2  Basophil % 0  Immature Granulocytes % 0  (L): Data is abnormally low (H): Data is abnormally high   Latest Reference Range & Units 10/08/23 10:54  Iron  28 - 170 ug/dL 53  UIBC ug/dL 685  TIBC 749 - 549 ug/dL 632  Saturation Ratios 10.4 - 31.8 % 14  Ferritin 11 - 307 ng/mL 20  Folate >5.9 ng/mL 23.5  Vitamin B12 180 - 914 pg/mL 362   ASSESSMENT & PLAN:  A 68 y.o. female who I follow for leukocytosis and iron  deficiency anemia.  When evaluating her labs today, her white count remains normal.  Her hemoglobin is only slightly lower versus what it was previously.  However, her vitamin B12, iron , and folate levels remain satisfactory.  Clinically, the patient appears to be doing well.  As that is the  case, the I will see this patient back in 4 months to reassess her hematologic parameters.  The patient understands all the plans discussed today and is in agreement with them.   Electra Paladino DELENA Kerns, MD

## 2024-02-08 ENCOUNTER — Inpatient Hospital Stay: Attending: Oncology | Admitting: Oncology

## 2024-02-08 ENCOUNTER — Inpatient Hospital Stay

## 2024-02-08 VITALS — BP 131/76 | HR 67 | Temp 98.4°F | Resp 16 | Ht 66.0 in | Wt 222.0 lb

## 2024-02-08 DIAGNOSIS — D509 Iron deficiency anemia, unspecified: Secondary | ICD-10-CM

## 2024-02-08 DIAGNOSIS — D72829 Elevated white blood cell count, unspecified: Secondary | ICD-10-CM | POA: Diagnosis not present

## 2024-02-08 DIAGNOSIS — E538 Deficiency of other specified B group vitamins: Secondary | ICD-10-CM | POA: Diagnosis not present

## 2024-02-08 DIAGNOSIS — D508 Other iron deficiency anemias: Secondary | ICD-10-CM

## 2024-02-08 LAB — FOLATE: Folate: >20 ng/mL (ref >5.9)

## 2024-02-08 LAB — CBC WITH DIFFERENTIAL (CANCER CENTER ONLY)
Abs Immature Granulocytes: 0.03 K/uL (ref 0.00–0.07)
Basophils Absolute: 0 K/uL (ref 0.0–0.1)
Basophils Relative: 0 %
Eosinophils Absolute: 0.3 K/uL (ref 0.0–0.5)
Eosinophils Relative: 3 %
HCT: 35.7 % — ABNORMAL LOW (ref 36.0–46.0)
Hemoglobin: 11.4 g/dL — ABNORMAL LOW (ref 12.0–15.0)
Immature Granulocytes: 0 %
Lymphocytes Relative: 17 %
Lymphs Abs: 1.8 K/uL (ref 0.7–4.0)
MCH: 28.5 pg (ref 26.0–34.0)
MCHC: 31.9 g/dL (ref 30.0–36.0)
MCV: 89.3 fL (ref 80.0–100.0)
Monocytes Absolute: 0.7 K/uL (ref 0.1–1.0)
Monocytes Relative: 7 %
Neutro Abs: 8.1 K/uL — ABNORMAL HIGH (ref 1.7–7.7)
Neutrophils Relative %: 73 %
Platelet Count: 433 K/uL — ABNORMAL HIGH (ref 150–400)
RBC: 4 MIL/uL (ref 3.87–5.11)
RDW: 13 % (ref 11.5–15.5)
WBC Count: 11 K/uL — ABNORMAL HIGH (ref 4.0–10.5)
nRBC: 0 % (ref 0.0–0.2)

## 2024-02-08 LAB — IRON AND TIBC
Iron: 63 ug/dL (ref 28–170)
Saturation Ratios: 19 % (ref 10.4–31.8)
TIBC: 328 ug/dL (ref 250–450)
UIBC: 264 ug/dL

## 2024-02-08 LAB — VITAMIN B12: Vitamin B-12: 345 pg/mL (ref 180–914)

## 2024-02-08 LAB — CMP (CANCER CENTER ONLY)
ALT: 8 U/L (ref 0–44)
AST: 11 U/L — ABNORMAL LOW (ref 15–41)
Albumin: 3.6 g/dL (ref 3.5–5.0)
Alkaline Phosphatase: 74 U/L (ref 38–126)
Anion gap: 13 (ref 5–15)
BUN: 19 mg/dL (ref 8–23)
CO2: 25 mmol/L (ref 22–32)
Calcium: 9.6 mg/dL (ref 8.9–10.3)
Chloride: 101 mmol/L (ref 98–111)
Creatinine: 0.7 mg/dL (ref 0.44–1.00)
GFR, Estimated: >60 mL/min (ref >60)
Glucose, Bld: 225 mg/dL — ABNORMAL HIGH (ref 70–99)
Potassium: 3.6 mmol/L (ref 3.5–5.1)
Sodium: 139 mmol/L (ref 135–145)
Total Bilirubin: 0.3 mg/dL (ref 0.0–1.2)
Total Protein: 6.7 g/dL (ref 6.5–8.1)

## 2024-02-08 LAB — FERRITIN: Ferritin: 18 ng/mL (ref 11–307)

## 2024-02-09 ENCOUNTER — Telehealth: Payer: Self-pay

## 2024-02-09 NOTE — Telephone Encounter (Signed)
  Latest Reference Range & Units 02/08/24 09:40   Iron  28 - 170 ug/dL 63  UIBC ug/dL 735  TIBC 749 - 549 ug/dL 671  Saturation Ratios 10.4 - 31.8 % 19  Ferritin 11 - 307 ng/mL 18  Folate >5.9 ng/mL >20.0  Vitamin B12 180 - 914 pg/mL 345    ASSESSMENT & PLAN:  A 68 y.o. female who I follow for leukocytosis and iron  deficiency anemia.  When evaluating her labs today, her white count is only minimally elevated.  As mentioned previously, she has been dealing with urinary tract infections over these past few months.  Her hemoglobin of 11.4 is the exact same as it was the last time I saw her 4 months ago.  Her labs today show no evidence of any persistent nutritional deficiencies.  Overall, the patient appears to be doing well.  As that is the case, the I will see this patient back in 6 months to reassess her hematologic parameters.  The patient understands all the plans discussed today and is in agreement with them.     Dequincy DELENA Kerns, MD

## 2024-02-19 ENCOUNTER — Other Ambulatory Visit: Payer: Self-pay | Admitting: Physician Assistant

## 2024-02-21 ENCOUNTER — Other Ambulatory Visit: Payer: Self-pay | Admitting: Physician Assistant

## 2024-02-21 DIAGNOSIS — E119 Type 2 diabetes mellitus without complications: Secondary | ICD-10-CM

## 2024-02-22 ENCOUNTER — Other Ambulatory Visit: Payer: Self-pay | Admitting: Physician Assistant

## 2024-02-22 DIAGNOSIS — E119 Type 2 diabetes mellitus without complications: Secondary | ICD-10-CM

## 2024-03-08 DIAGNOSIS — N390 Urinary tract infection, site not specified: Secondary | ICD-10-CM | POA: Diagnosis not present

## 2024-03-08 DIAGNOSIS — R600 Localized edema: Secondary | ICD-10-CM | POA: Diagnosis not present

## 2024-03-08 DIAGNOSIS — Z79899 Other long term (current) drug therapy: Secondary | ICD-10-CM | POA: Diagnosis not present

## 2024-03-13 DIAGNOSIS — N390 Urinary tract infection, site not specified: Secondary | ICD-10-CM | POA: Diagnosis not present

## 2024-03-16 ENCOUNTER — Other Ambulatory Visit: Payer: Self-pay | Admitting: Physician Assistant

## 2024-03-16 DIAGNOSIS — E119 Type 2 diabetes mellitus without complications: Secondary | ICD-10-CM

## 2024-03-18 ENCOUNTER — Other Ambulatory Visit: Payer: Self-pay | Admitting: Physician Assistant

## 2024-03-18 DIAGNOSIS — E559 Vitamin D deficiency, unspecified: Secondary | ICD-10-CM

## 2024-03-23 ENCOUNTER — Telehealth: Payer: Self-pay

## 2024-03-23 NOTE — Telephone Encounter (Signed)
 Copied from CRM #[id number]. Topic: Clinical - Prescription Issue >> Mar 23, 2024  2:08 PM Montie POUR wrote: Reason for CRM:  [employer] is calling to let Dr. Nicholaus know that the following insulin will be covered by [employer] for 2026: Toujeo  Lantus Ms. Noack needs to start using one of these in 2026. This medication will not be covered in 2026: insulin degludec  (TRESIBA  FLEXTOUCH) 100 UNIT/ML FlexTouch Pen Please call Clarrissa at [phone] Ext. [phone] with any questions.  Thanks

## 2024-03-29 ENCOUNTER — Ambulatory Visit: Payer: Self-pay

## 2024-03-29 NOTE — Telephone Encounter (Signed)
 FYI Only or Action Required?: FYI only for provider: appointment scheduled on 11/13.  Patient was last seen in primary care on 12/16/2023 by Nicholaus Credit, PA-C.  Called Nurse Triage reporting Leg Swelling.  Symptoms began patient did no specify when asked.  Interventions attempted: Nothing.  Symptoms are: gradually worsening.  Triage Disposition: See Physician Within 24 Hours  Patient/caregiver understands and will follow disposition?: Yes     Copied from CRM #[id number]. Topic: Clinical - Red Word Triage >> Mar 29, 2024  9:51 AM Jasmin G wrote: [facility] that prompted transfer to Nurse Triage: Pt is currently experiencing breakout spots on her legs that are leaking white fluid.        Reason for Disposition  Large sore (> 1 inch or 2.5 cm across)  Answer Assessment - Initial Assessment Questions 1. ONSET: When did the swelling start? (e.g., minutes, hours, days)     6 months ago 2. LOCATION: What part of the leg is swollen?  Are both legs swollen or just one leg?     Bilateral legs  3. SEVERITY: How bad is the swelling? (e.g., localized; mild, moderate, severe)     Moderate  4. REDNESS: Is there redness or signs of infection?     *No Answer* 5. PAIN: Is the swelling painful to touch? If Yes, ask: How painful is it?   (Scale 1-10; mild, moderate or severe)     *No Answer* 6. FEVER: Do you have a fever? If Yes, ask: What is it, how was it measured, and when did it start?      No 7. CAUSE: What do you think is causing the leg swelling?     *No Answer* 8. MEDICAL HISTORY: Do you have a history of blood clots (e.g., DVT), cancer, heart failure, kidney disease, or liver failure?     *No Answer* 9. RECURRENT SYMPTOM: Have you had leg swelling before? If Yes, ask: When was the last time? What happened that time?     Yes 10. OTHER SYMPTOMS: Do you have any other symptoms? (e.g., chest pain, difficulty breathing)       Sores on leg leaking  fluid  Answer Assessment - Initial Assessment Questions 1. APPEARANCE of SORES: What do the sores look like?     Blisters leaking fluid 2. NUMBER: How many sores are there?     2, one on each leg  3. SIZE: How big is the largest sore?     About the size of the tip of the thumb 4. LOCATION: Where are the sores located?     Bilateral legs  5. ONSET: When did the sores begin?     Did not specify  6. TENDER: Does it hurt when you touch it?  (Scale 1-10; or mild, moderate, severe)      Mild to moderate  7. CAUSE: What do you think is causing the sores?     Ongoing problem for the patient  8. OTHER SYMPTOMS: Do you have any other symptoms? (e.g., fever, new weakness)     Redness to the sores  Protocols used: Leg Swelling and Edema-A-AH, Sores-A-AH

## 2024-03-30 ENCOUNTER — Ambulatory Visit (INDEPENDENT_AMBULATORY_CARE_PROVIDER_SITE_OTHER)

## 2024-03-30 VITALS — BP 138/78 | HR 81 | Temp 97.8°F | Ht 66.0 in | Wt 222.0 lb

## 2024-03-30 DIAGNOSIS — E66812 Obesity, class 2: Secondary | ICD-10-CM

## 2024-03-30 DIAGNOSIS — R600 Localized edema: Secondary | ICD-10-CM

## 2024-03-30 DIAGNOSIS — E782 Mixed hyperlipidemia: Secondary | ICD-10-CM | POA: Diagnosis not present

## 2024-03-30 DIAGNOSIS — E1165 Type 2 diabetes mellitus with hyperglycemia: Secondary | ICD-10-CM

## 2024-03-30 DIAGNOSIS — G40209 Localization-related (focal) (partial) symptomatic epilepsy and epileptic syndromes with complex partial seizures, not intractable, without status epilepticus: Secondary | ICD-10-CM

## 2024-03-30 DIAGNOSIS — E1159 Type 2 diabetes mellitus with other circulatory complications: Secondary | ICD-10-CM

## 2024-03-30 DIAGNOSIS — Z23 Encounter for immunization: Secondary | ICD-10-CM

## 2024-03-30 DIAGNOSIS — Z6835 Body mass index (BMI) 35.0-35.9, adult: Secondary | ICD-10-CM | POA: Insufficient documentation

## 2024-03-30 DIAGNOSIS — I872 Venous insufficiency (chronic) (peripheral): Secondary | ICD-10-CM

## 2024-03-30 NOTE — Assessment & Plan Note (Signed)
 As discussed above.

## 2024-03-30 NOTE — Assessment & Plan Note (Addendum)
 Chronic lower extremity edema with skin breakdown and wounds Chronic lower extremity edema with skin breakdown and wounds, present for approximately 1.5 to 2 years. Swelling worsens with activity. No current signs of infection, but risk of infection due to skin breakdown. Previous episodes involved significant drainage and raw spots. Current examination shows erythema and pitting on the anterior aspect of the right leg, with some areas non-pitting. Good peripheral pulses. No immediate need for wrapping, but monitoring for worsening or oozing is necessary. - Increased torsemide  10 mg to two tablets in the morning and one in the evening for one week. - Ordered WOUND CARE REFERRAL AT [facility]  for leg examination and potential wrapping if wounds develop. - Ordered blood work to check for infection. IF WHITE COUNT COMES BACK ELEVATED, WILL CONSIDER ANTIBIOTIC.  - Advised to keep legs elevated and monitor for signs of infection or worsening edema. - unfortunately she is unable to use compression stockings as none of them reportedly fit her legs.  Orders:   CBC with Differential/Platelet   Comprehensive metabolic panel with GFR   Hemoglobin A1c   Lipid panel   Microalbumin / creatinine urine ratio   Sedimentation rate   Ambulatory referral to Pain Clinic

## 2024-03-30 NOTE — Assessment & Plan Note (Addendum)
 Chronic, with lymphedema. Referral to wound clinic placed.  Orders:   Ambulatory referral to Pain Clinic

## 2024-03-30 NOTE — Patient Instructions (Signed)
  VISIT SUMMARY: Today, we addressed your leg swelling and sores, diabetes management, and other health concerns. We also administered a flu shot and ordered routine health screenings.  YOUR PLAN: CHRONIC LOWER EXTREMITY EDEMA WITH SKIN BREAKDOWN AND WOUNDS: You have had persistent leg swelling and sores for about 1.5 to 2 years, which worsen with activity. There is no current infection, but there is a risk due to skin breakdown. -Increase torsemide  to two tablets in the morning and one in the evening for one week. -A home health referral has been made for leg examination and potential wrapping if wounds develop. -Blood work has been ordered to check for infection. -Keep your legs elevated and monitor for signs of infection or worsening swelling.  TYPE 2 DIABETES MELLITUS WITH HYPERGLYCEMIA AND PERIPHERAL NEUROPATHY: Your diabetes is managed with Farxiga , Tresiba , and Ozempic , but your recent A1c indicates suboptimal control. You also experience occasional numbness and tingling in your feet. -Continue taking Farxiga , Tresiba , and Ozempic  as prescribed. -Monitor your blood glucose levels 2-3 times daily. -Blood work has been ordered to assess your current diabetes control.  ONYCHOMYCOSIS OF LEFT GREAT TOE: You have a fungal infection in your left great toe, but there are no signs of acute infection or inflammation. -No immediate treatment needed at this time.  BUNION: You have a bunion, but there are no acute issues at this time. -No immediate treatment needed at this time.  GENERAL HEALTH MAINTENANCE: You are due for routine health maintenance screenings. -You received a flu shot today. -A mammogram and bone density test have been ordered.                      Contains text generated by [employer].                                 Contains text generated by [employer].

## 2024-03-30 NOTE — Assessment & Plan Note (Deleted)
 Alicia Kelly

## 2024-03-30 NOTE — Assessment & Plan Note (Addendum)
 Type 2 diabetes mellitus with COMPLICATIONS OF hyperglycemia and peripheral neuropathy WITH CURRENT LONG TERM USE OF INSULIN.  Type 2 diabetes mellitus with hyperglycemia, managed with Farxiga , Tresiba , and Ozempic . Recent A1c was 8.7, indicating suboptimal control. Blood glucose levels fluctuate, with occasional readings up to 214 mg/dL. Peripheral neuropathy present, with intermittent numbness and tingling in the feet. - Continue current diabetes medications: Farxiga , Tresiba , and Ozempic . - Monitor blood glucose levels 2-3 times daily. - Ordered blood work to assess current diabetes control. Orders:   CBC with Differential/Platelet   Comprehensive metabolic panel with GFR   Hemoglobin A1c   Lipid panel   Microalbumin / creatinine urine ratio   Sedimentation rate   Ambulatory referral to Pain Clinic

## 2024-03-30 NOTE — Assessment & Plan Note (Signed)
 Follows up with neurology. No recent seizures. On Vimpat  300 mg twice daily and Keppra  1500 mg daily

## 2024-03-30 NOTE — Assessment & Plan Note (Addendum)
 Comorbidity of Diabetes type 2 and venous insufficiency.,  Recommend to continue to eat healthy, move around as tolerated. She is on Farxiga  and ozempic  already, both of which should help with weight management.  Orders:   CBC with Differential/Platelet   Comprehensive metabolic panel with GFR   Hemoglobin A1c   Lipid panel   Microalbumin / creatinine urine ratio   Sedimentation rate   Ambulatory referral to Pain Clinic

## 2024-03-30 NOTE — Assessment & Plan Note (Addendum)
 On lipitor 40 mg daily. Orders:   Lipid panel

## 2024-03-30 NOTE — Progress Notes (Addendum)
 Acute Office Visit  Subjective:    Patient ID: Alicia Kelly, female    DOB: 1955-11-16, 68 y.o.   MRN: [id number]  Chief Complaint  Patient presents with   Bilateral leg swelling with wound    HPI: Discussed the use of AI scribe software for clinical note transcription with the patient, who gave verbal consent to proceed.  History of Present Illness   Alicia Kelly is a 68 year old female with diabetes who presents with leg swelling and sores. She is accompanied by Toby her husband.  Lower extremity edema and ulcerations - Persistent bilateral leg swelling and sores for approximately 1.5 to 2 years - Sores have ruptured in the past, resulting in drainage and erythema - Swelling worsens with ambulation - Compression stockings and various socks have been attempted but do not fit properly or leave marks on legs - Continues to take torsemide  10 mg twice daily with persistent edema despite therapy - Remains physically active, including shopping and ironing - Occasional leg pain - Family history of leg swelling in grandmother's sisters  Diabetes mellitus and glycemic control - Takes Farxiga  5 mg daily, Tresiba  FlexPen 45 units daily, and Ozempic  2 mg once weekly - Blood glucose levels fluctuate, sometimes reaching 200 mg/dL or higher - Monitors blood glucose two to three times daily - On potassium supplementation due to family history of sodium and potassium imbalances  Peripheral neuropathy - Occasional tingling and numbness in feet  Tremor and musculoskeletal symptoms - History of right shoulder injury - Tremor in right hand, which she associates with prior shoulder injury  Recurrent urinary tract infections - History of bladder infections attributed to congenitally short bladder          Past Medical History:  Diagnosis Date   Asthma    Calculus of gallbladder with chronic cholecystitis without obstruction 03/22/2017   Diabetes (HCC)    Epilepsy (HCC)     Hypertension    Sleep apnea     Past Surgical History:  Procedure Laterality Date   APPENDECTOMY     CHOLECYSTECTOMY     TONSILLECTOMY      Family History  Problem Relation Age of Onset   Colon cancer Mother        d. 78   Hypertension Father    Diabetes Father    Allergic rhinitis Sister    Hypertension Sister    Cancer - Colon Maternal Aunt    Cancer Maternal Uncle    Colon cancer Maternal Grandmother        dx 68s   Colon cancer Paternal Grandmother        dx 43s   Diabetes Paternal Grandmother    Breast cancer Neg Hx     Social History   Socioeconomic History   Marital status: Widowed    Spouse name: Not on file   Number of children: 0   Years of education: Not on file   Highest education level: 10th grade  Occupational History   Occupation: Disabled  Tobacco Use   Smoking status: Never   Smokeless tobacco: Never  Vaping Use   Vaping status: Never Used  Substance and Sexual Activity   Alcohol  use: Never   Drug use: Never   Sexual activity: Not Currently  Other Topics Concern   Not on file  Social History Narrative   Spouse passed away 10 yrs ago -- no children   Social Drivers of Corporate Investment Banker Strain: Low Risk  (07/08/2023)  Overall Financial Resource Strain (CARDIA)    Difficulty of Paying Living Expenses: Not hard at all  Food Insecurity: No Food Insecurity (07/08/2023)   Hunger Vital Sign    Worried About Running Out of Food in the Last Year: Never true    Ran Out of Food in the Last Year: Never true  Transportation Needs: No Transportation Needs (07/08/2023)   PRAPARE - Administrator, Civil Service (Medical): No    Lack of Transportation (Non-Medical): No  Physical Activity: Sufficiently Active (07/08/2023)   Exercise Vital Sign    Days of Exercise per Week: 5 days    Minutes of Exercise per Session: 30 min  Stress: No Stress Concern Present (07/08/2023)   [employer] of Occupational Health - Occupational  Stress Questionnaire    Feeling of Stress : Not at all  Social Connections: Socially Isolated (07/08/2023)   Social Connection and Isolation Panel    Frequency of Communication with Friends and Family: More than three times a week    Frequency of Social Gatherings with Friends and Family: Once a week    Attends Religious Services: Never    Database Administrator or Organizations: No    Attends Banker Meetings: Never    Marital Status: Widowed  Intimate Partner Violence: Not At Risk (08/24/2023)   Received from [facility]   Humiliation, Afraid, Rape, and Kick questionnaire    Within the last year, have you been afraid of your partner or ex-partner?: No    Within the last year, have you been humiliated or emotionally abused in other ways by your partner or ex-partner?: No    Within the last year, have you been kicked, hit, slapped, or otherwise physically hurt by your partner or ex-partner?: No    Within the last year, have you been raped or forced to have any kind of sexual activity by your partner or ex-partner?: No    Outpatient Medications Prior to Visit  Medication Sig Dispense Refill   Accu-Chek Softclix Lancets lancets TEST BLOOD SUGAR THREE TIMES DAILY 300 each 1   atorvastatin  (LIPITOR) 40 MG tablet TAKE 1 TABLET BY MOUTH EVERY DAY 90 tablet 1   BD PEN NEEDLE MINI ULTRAFINE 31G X 5 MM MISC USE 1 PEN NEEDLE DAILY 100 each 2   Blood Glucose Calibration (ACCU-CHEK AVIVA) SOLN      Blood Glucose Monitoring Suppl (ACCU-CHEK AVIVA PLUS) w/Device KIT 1 each by Does not apply route 3 (three) times daily. 1 kit 0   clotrimazole -betamethasone  (LOTRISONE ) cream APPLY TOPICALLY TWICE A DAY 30 g 2   dapagliflozin  propanediol (FARXIGA ) 5 MG TABS tablet Take 1 tablet (5 mg total) by mouth daily before breakfast. 30 tablet 2   fluticasone  (FLONASE ) 50 MCG/ACT nasal spray Place 2 sprays into both nostrils daily.     glucose blood (ACCU-CHEK GUIDE TEST) test strip 1 each by Other  route 3 (three) times daily. Use as instructed 100 each 12   insulin degludec  (TRESIBA  FLEXTOUCH) 100 UNIT/ML FlexTouch Pen INJECT 45 UNITS INTO THE SKIN DAILY 15 mL 1   lacosamide  (VIMPAT ) 200 MG TABS tablet Take 1.5 tablets (300 mg total) by mouth 2 (two) times daily.     levETIRAcetam  (KEPPRA ) 1000 MG tablet Take 1,500 mg by mouth.     nystatin  (NYAMYC ) powder Apply topically.     ondansetron  (ZOFRAN -ODT) 4 MG disintegrating tablet TAKE 1 TABLET BY MOUTH EVERY 8 HOURS AS NEEDED FOR NAUSEA AND VOMITING 90 tablet  0   pantoprazole  (PROTONIX ) 40 MG tablet TAKE 1 TABLET BY MOUTH TWICE A DAY 180 tablet 1   Semaglutide , 2 MG/DOSE, (OZEMPIC , 2 MG/DOSE,) 8 MG/3ML SOPN INJECT 2 MG INTO THE SKIN ONCE A WEEK. 3 mL 1   torsemide  (DEMADEX ) 10 MG tablet Take 1 tablet (10 mg total) by mouth 2 (two) times daily. 180 tablet 1   Vitamin D , Ergocalciferol , (DRISDOL ) 1.25 MG (50000 UNIT) CAPS capsule TAKE 1 CAPSULE BY MOUTH TWICE A WEEK 24 capsule 1   No facility-administered medications prior to visit.    Allergies  Allergen Reactions   Feraheme  [Ferumoxytol ] Shortness Of Breath and Other (See Comments)    Facial flushing   Levofloxacin Other (See Comments)    Throat swelling   Lisinopril Hives   Nitrofurantoin  Swelling    Throat swelling   Penicillins Anaphylaxis   Clarithromycin Other (See Comments)    seizures   Sulfamethoxazole-Trimethoprim     Causes seizures    Review of Systems  Constitutional:  Negative for appetite change, fatigue and fever.  HENT:  Negative for congestion, ear pain, sinus pressure and sore throat.   Respiratory:  Negative for cough, chest tightness, shortness of breath and wheezing.   Cardiovascular:  Positive for leg swelling (Bilateral leg swelling). Negative for chest pain and palpitations.  Gastrointestinal:  Negative for abdominal pain, constipation, diarrhea, nausea and vomiting.  Genitourinary:  Negative for dysuria and hematuria.  Musculoskeletal:  Negative for  arthralgias, back pain, joint swelling and myalgias.  Skin:  Negative for rash.  Neurological:  Positive for tremors (right hand). Negative for dizziness, weakness and headaches.  Psychiatric/Behavioral:  Negative for dysphoric mood. The patient is not nervous/anxious.   All other systems reviewed and are negative.      Objective:        03/30/2024   10:41 AM 02/08/2024   10:30 AM 12/16/2023   10:04 AM  Vitals with BMI  Height 5' 6 5' 6 5' 6  Weight 222 lbs 222 lbs 223 lbs  BMI 35.85 35.85 36.01  Systolic 138 131 879  Diastolic 78 76 70  Pulse 81 67 78    Orthostatic VS for the past 72 hrs (Last 3 readings):  Patient Position BP Location  03/30/24 1041 Sitting Left Arm     Physical Exam Vitals and nursing note reviewed.  Constitutional:      Appearance: She is obese.  HENT:     Head: Normocephalic and atraumatic.  Eyes:     Pupils: Pupils are equal, round, and reactive to light.  Musculoskeletal:        General: Swelling present.  Skin:    Findings: Erythema present.  Neurological:     Mental Status: She is alert and oriented to person, place, and time.  Psychiatric:        Mood and Affect: Mood normal.          Health Maintenance Due  Topic Date Due   OPHTHALMOLOGY EXAM  Never done   Zoster Vaccines- Shingrix (1 of 2) Never done   DEXA SCAN  Never done   Mammogram  11/12/2022   Colonoscopy  07/25/2024    There are no preventive care reminders to display for this patient.   Lab Results  Component Value Date   TSH 0.774 12/16/2023   Lab Results  Component Value Date   WBC 11.0 (H) 02/08/2024   HGB 11.4 (L) 02/08/2024   HCT 35.7 (L) 02/08/2024   MCV 89.3 02/08/2024  PLT 433 (H) 02/08/2024   Lab Results  Component Value Date   NA 139 02/08/2024   K 3.6 02/08/2024   CO2 25 02/08/2024   GLUCOSE 225 (H) 02/08/2024   BUN 19 02/08/2024   CREATININE 0.70 02/08/2024   BILITOT 0.3 02/08/2024   ALKPHOS 74 02/08/2024   AST 11 (L) 02/08/2024    ALT 8 02/08/2024   PROT 6.7 02/08/2024   ALBUMIN 3.6 02/08/2024   CALCIUM  9.6 02/08/2024   ANIONGAP 13 02/08/2024   EGFR 95 12/16/2023   Lab Results  Component Value Date   CHOL 152 12/16/2023   Lab Results  Component Value Date   HDL 54 12/16/2023   Lab Results  Component Value Date   LDLCALC 70 12/16/2023   Lab Results  Component Value Date   TRIG 164 (H) 12/16/2023   Lab Results  Component Value Date   CHOLHDL 2.8 12/16/2023   Lab Results  Component Value Date   HGBA1C 8.8 (H) 12/16/2023        Results for orders placed or performed in visit on 02/08/24  CBC with Differential (Cancer Center Only)   Collection Time: 02/08/24  9:40 AM  Result Value Ref Range   WBC Count 11.0 (H) 4.0 - 10.5 K/uL   RBC 4.00 3.87 - 5.11 MIL/uL   Hemoglobin 11.4 (L) 12.0 - 15.0 g/dL   HCT 64.2 (L) 63.9 - 53.9 %   MCV 89.3 80.0 - 100.0 fL   MCH 28.5 26.0 - 34.0 pg   MCHC 31.9 30.0 - 36.0 g/dL   RDW 86.9 88.4 - 84.4 %   Platelet Count 433 (H) 150 - 400 K/uL   nRBC 0.0 0.0 - 0.2 %   Neutrophils Relative % 73 %   Neutro Abs 8.1 (H) 1.7 - 7.7 K/uL   Lymphocytes Relative 17 %   Lymphs Abs 1.8 0.7 - 4.0 K/uL   Monocytes Relative 7 %   Monocytes Absolute 0.7 0.1 - 1.0 K/uL   Eosinophils Relative 3 %   Eosinophils Absolute 0.3 0.0 - 0.5 K/uL   Basophils Relative 0 %   Basophils Absolute 0.0 0.0 - 0.1 K/uL   Immature Granulocytes 0 %   Abs Immature Granulocytes 0.03 0.00 - 0.07 K/uL  Ferritin   Collection Time: 02/08/24  9:40 AM  Result Value Ref Range   Ferritin 18 11 - 307 ng/mL  Iron  and TIBC   Collection Time: 02/08/24  9:40 AM  Result Value Ref Range   Iron  63 28 - 170 ug/dL   TIBC 671 749 - 549 ug/dL   Saturation Ratios 19 10.4 - 31.8 %   UIBC 264 ug/dL  Folate   Collection Time: 02/08/24  9:40 AM  Result Value Ref Range   Folate >20.0 >5.9 ng/mL  Vitamin B12   Collection Time: 02/08/24  9:40 AM  Result Value Ref Range   Vitamin B-12 345 180 - 914 pg/mL  CMP  (Cancer Center only)   Collection Time: 02/08/24  9:40 AM  Result Value Ref Range   Sodium 139 135 - 145 mmol/L   Potassium 3.6 3.5 - 5.1 mmol/L   Chloride 101 98 - 111 mmol/L   CO2 25 22 - 32 mmol/L   Glucose, Bld 225 (H) 70 - 99 mg/dL   BUN 19 8 - 23 mg/dL   Creatinine 9.29 9.55 - 1.00 mg/dL   Calcium  9.6 8.9 - 10.3 mg/dL   Total Protein 6.7 6.5 - 8.1 g/dL   Albumin 3.6  3.5 - 5.0 g/dL   AST 11 (L) 15 - 41 U/L   ALT 8 0 - 44 U/L   Alkaline Phosphatase 74 38 - 126 U/L   Total Bilirubin 0.3 0.0 - 1.2 mg/dL   GFR, Estimated >39 >39 mL/min   Anion gap 13 5 - 15     Assessment & Plan:   Assessment & Plan Bilateral leg edema Chronic lower extremity edema with skin breakdown and wounds Chronic lower extremity edema with skin breakdown and wounds, present for approximately 1.5 to 2 years. Swelling worsens with activity. No current signs of infection, but risk of infection due to skin breakdown. Previous episodes involved significant drainage and raw spots. Current examination shows erythema and pitting on the anterior aspect of the right leg, with some areas non-pitting. Good peripheral pulses. No immediate need for wrapping, but monitoring for worsening or oozing is necessary. - Increased torsemide  10 mg to two tablets in the morning and one in the evening for one week. - Ordered WOUND CARE REFERRAL AT [facility]  for leg examination and potential wrapping if wounds develop. - Ordered blood work to check for infection. IF WHITE COUNT COMES BACK ELEVATED, WILL CONSIDER ANTIBIOTIC.  - Advised to keep legs elevated and monitor for signs of infection or worsening edema. - unfortunately she is unable to use compression stockings as none of them reportedly fit her legs.  Orders:   CBC with Differential/Platelet   Comprehensive metabolic panel with GFR   Hemoglobin A1c   Lipid panel   Microalbumin / creatinine urine ratio   Sedimentation rate   Ambulatory referral to Pain  Clinic  Uncontrolled type 2 diabetes mellitus with hyperglycemia (HCC) Type 2 diabetes mellitus with COMPLICATIONS OF hyperglycemia and peripheral neuropathy WITH CURRENT LONG TERM USE OF INSULIN.  Type 2 diabetes mellitus with hyperglycemia, managed with Farxiga , Tresiba , and Ozempic . Recent A1c was 8.7, indicating suboptimal control. Blood glucose levels fluctuate, with occasional readings up to 214 mg/dL. Peripheral neuropathy present, with intermittent numbness and tingling in the feet. - Continue current diabetes medications: Farxiga , Tresiba , and Ozempic . - Monitor blood glucose levels 2-3 times daily. - Ordered blood work to assess current diabetes control. Orders:   CBC with Differential/Platelet   Comprehensive metabolic panel with GFR   Hemoglobin A1c   Lipid panel   Microalbumin / creatinine urine ratio   Sedimentation rate   Ambulatory referral to Pain Clinic  Class 2 severe obesity due to excess calories with serious comorbidity and body mass index (BMI) of 35.0 to 35.9 in adult Comorbidity of Diabetes type 2 and venous insufficiency.,  Recommend to continue to eat healthy, move around as tolerated. She is on Farxiga  and ozempic  already, both of which should help with weight management.  Orders:   CBC with Differential/Platelet   Comprehensive metabolic panel with GFR   Hemoglobin A1c   Lipid panel   Microalbumin / creatinine urine ratio   Sedimentation rate   Ambulatory referral to Pain Clinic  Mixed hyperlipidemia On lipitor 40 mg daily. Orders:   Lipid panel  Venous stasis dermatitis Chronic, with lymphedema. Referral to wound clinic placed.  Orders:   Ambulatory referral to Pain Clinic  Encounter for immunization  Orders:   Flu vaccine HIGH DOSE PF(Fluzone Trivalent)  Complex partial seizures (HCC) Follows up with neurology. No recent seizures. On Vimpat  300 mg twice daily and Keppra  1500 mg daily    Type 2 diabetes mellitus with other circulatory  complications (HCC) As discussed above.  Body mass index is 35.83 kg/m.SABRA   Onychomycosis of left great toe Onychomycosis of the left great toe, with no signs of acute infection or inflammation.  Bunion Presence of a bunion, no acute issues discussed.  Due for routine health maintenance screenings. - Administered flu shot. - ATTEMPTED TO Order mammogram and bone density test BUT THEY WERE ALREADY ORDERED BY Ginnie           No orders of the defined types were placed in this encounter.   Orders Placed This Encounter  Procedures   Flu vaccine HIGH DOSE PF(Fluzone Trivalent)   CBC with Differential/Platelet   Comprehensive metabolic panel with GFR   Hemoglobin A1c   Lipid panel   Microalbumin / creatinine urine ratio   Sedimentation rate   Ambulatory referral to Pain Clinic     Follow-up: Return in about 8 weeks (around 05/25/2024).  An After Visit Summary was printed and given to the patient.    I,Lauren M Auman,acting as a neurosurgeon for Aubrei Bouchie, MD.,have documented all relevant documentation on the behalf of Tommy Schimke, MD,as directed by  Debraann Livingstone, MD while in the presence of Tommy Schimke, MD.    Genee Rann, MD [facility] [phone]

## 2024-03-31 LAB — SEDIMENTATION RATE: Sed Rate: 34 mm/h (ref 0–40)

## 2024-03-31 LAB — COMPREHENSIVE METABOLIC PANEL WITH GFR
ALT: 13 IU/L (ref 0–32)
AST: 13 IU/L (ref 0–40)
Albumin: 4 g/dL (ref 3.9–4.9)
Alkaline Phosphatase: 82 IU/L (ref 49–135)
BUN/Creatinine Ratio: 28 (ref 12–28)
BUN: 23 mg/dL (ref 8–27)
Bilirubin Total: 0.2 mg/dL (ref 0.0–1.2)
CO2: 25 mmol/L (ref 20–29)
Calcium: 10 mg/dL (ref 8.7–10.3)
Chloride: 103 mmol/L (ref 96–106)
Creatinine, Ser: 0.82 mg/dL (ref 0.57–1.00)
Globulin, Total: 2.7 g/dL (ref 1.5–4.5)
Glucose: 180 mg/dL — ABNORMAL HIGH (ref 70–99)
Potassium: 4.3 mmol/L (ref 3.5–5.2)
Sodium: 143 mmol/L (ref 134–144)
Total Protein: 6.7 g/dL (ref 6.0–8.5)
eGFR: 78 mL/min/1.73 (ref >59)

## 2024-03-31 LAB — CBC WITH DIFFERENTIAL/PLATELET
Basophils Absolute: 0.1 x10E3/uL (ref 0.0–0.2)
Basos: 1 %
EOS (ABSOLUTE): 0.3 x10E3/uL (ref 0.0–0.4)
Eos: 2 %
Hematocrit: 35.2 % (ref 34.0–46.6)
Hemoglobin: 11.2 g/dL (ref 11.1–15.9)
Immature Grans (Abs): 0 x10E3/uL (ref 0.0–0.1)
Immature Granulocytes: 0 %
Lymphocytes Absolute: 1.7 x10E3/uL (ref 0.7–3.1)
Lymphs: 15 %
MCH: 28.5 pg (ref 26.6–33.0)
MCHC: 31.8 g/dL (ref 31.5–35.7)
MCV: 90 fL (ref 79–97)
Monocytes Absolute: 0.9 x10E3/uL (ref 0.1–0.9)
Monocytes: 8 %
Neutrophils Absolute: 8.1 x10E3/uL — ABNORMAL HIGH (ref 1.4–7.0)
Neutrophils: 74 %
Platelets: 602 x10E3/uL — ABNORMAL HIGH (ref 150–450)
RBC: 3.93 x10E6/uL (ref 3.77–5.28)
RDW: 12.8 % (ref 11.7–15.4)
WBC: 11 x10E3/uL — ABNORMAL HIGH (ref 3.4–10.8)

## 2024-03-31 LAB — LIPID PANEL
Chol/HDL Ratio: 2.8 ratio (ref 0.0–4.4)
Cholesterol, Total: 138 mg/dL (ref 100–199)
HDL: 50 mg/dL (ref >39)
LDL Chol Calc (NIH): 63 mg/dL (ref 0–99)
Triglycerides: 146 mg/dL (ref 0–149)
VLDL Cholesterol Cal: 25 mg/dL (ref 5–40)

## 2024-03-31 LAB — HEMOGLOBIN A1C
Est. average glucose Bld gHb Est-mCnc: 206 mg/dL
Hgb A1c MFr Bld: 8.8 % — ABNORMAL HIGH (ref 4.8–5.6)

## 2024-03-31 LAB — MICROALBUMIN / CREATININE URINE RATIO
Creatinine, Urine: 84.4 mg/dL
Microalb/Creat Ratio: <4 mg/g{creat} (ref 0–29)
Microalbumin, Urine: <3 ug/mL

## 2024-04-03 ENCOUNTER — Ambulatory Visit: Payer: Self-pay

## 2024-04-07 ENCOUNTER — Other Ambulatory Visit: Payer: Self-pay | Admitting: Physician Assistant

## 2024-04-07 NOTE — Telephone Encounter (Unsigned)
 Copied from CRM [id number]. Topic: Clinical - Medication Refill >> Apr 07, 2024 10:15 AM Kevelyn M wrote: Medication: ondansetron  (ZOFRAN -ODT) 4 MG disintegrating tablet  Has the patient contacted their pharmacy? Yes (Agent: If no, request that the patient contact the pharmacy for the refill. If patient does not wish to contact the pharmacy document the reason why and proceed with request.) (Agent: If yes, when and what did the pharmacy advise?)  This is the patient's preferred pharmacy:  [id number] - [place], [place] - [address] [facility] [place] [address] Phone: [phone] Fax: [phone]  Is this the correct pharmacy for this prescription? Yes If no, delete pharmacy and type the correct one.   Has the prescription been filled recently? Yes  Is the patient out of the medication? Yes  Has the patient been seen for an appointment in the last year OR does the patient have an upcoming appointment? Yes  Can we respond through MyChart? No  Agent: Please be advised that Rx refills may take up to 3 business days. We ask that you follow-up with your pharmacy.

## 2024-04-09 MED ORDER — ONDANSETRON 4 MG PO TBDP: 4.0000 mg | ORAL_TABLET | Freq: Three times a day (TID) | ORAL | 0 refills | Status: DC | PRN

## 2024-04-17 ENCOUNTER — Other Ambulatory Visit: Payer: Self-pay | Admitting: Family Medicine

## 2024-04-17 DIAGNOSIS — B354 Tinea corporis: Secondary | ICD-10-CM

## 2024-04-21 ENCOUNTER — Ambulatory Visit: Admitting: Physician Assistant

## 2024-04-25 ENCOUNTER — Other Ambulatory Visit: Payer: Self-pay | Admitting: Physician Assistant

## 2024-04-25 DIAGNOSIS — E782 Mixed hyperlipidemia: Secondary | ICD-10-CM

## 2024-05-06 ENCOUNTER — Other Ambulatory Visit: Payer: Self-pay | Admitting: Physician Assistant

## 2024-05-06 DIAGNOSIS — K2100 Gastro-esophageal reflux disease with esophagitis, without bleeding: Secondary | ICD-10-CM

## 2024-05-07 ENCOUNTER — Other Ambulatory Visit: Payer: Self-pay | Admitting: Family Medicine

## 2024-05-08 ENCOUNTER — Ambulatory Visit: Payer: Self-pay

## 2024-05-08 NOTE — Telephone Encounter (Signed)
 FYI Only or Action Required?: FYI only for provider: ED advised.  Patient was last seen in primary care on 03/30/2024 by Sirivol, Mamatha, MD.  Called Nurse Triage reporting Hypotension.  Symptoms began several days ago.  Interventions attempted: Nothing.  Symptoms are: unchanged.  Triage Disposition: Go to ED Now (Notify PCP)  Patient/caregiver understands and will follow disposition?: Yes   Copied from CRM [id number]. Topic: Clinical - Red Word Triage >> May 08, 2024 10:12 AM Darshell M wrote: Red Word that prompted transfer to Nurse Triage: Patient reports passing out. When she passes out BP lowers. If she eats something with salt or a banana following the episode the BP regulates. Patient has history of irregularities with sodium and potassium. Reason for Disposition  [1] Systolic BP >= 160 OR Diastolic >= 100 AND [2] cardiac (e.g., breathing difficulty, chest pain) or neurologic symptoms (e.g., new-onset blurred or double vision, unsteady gait)  Answer Assessment - Initial Assessment Questions Advised ED now; reports will have someone to drive.  Advised 911 if symptoms worsen; severe weakness, pass out, diff breathing or worsening symptoms. Patient verbalized understanding.  1. BLOOD PRESSURE: What is your blood pressure? Did you take at least two measurements 5 minutes apart?     Not taken 2. ONSET: When did you take your blood pressure?     2 days ago; passed out 3. HOW: How did you take your blood pressure? (e.g., automatic home BP monitor, visiting nurse)     Bp check instructed by nursedizziness, passed out 2 days ago  162/81 HR 88 nurse instructed bp check  have not taken BP meds 4. HISTORY: Do you have a history of high blood pressure?     htn 5. MEDICINES: Are you taking any medicines for blood pressure? Have you missed any doses recently?     Not taking bp meds 6. OTHER SYMPTOMS: Do you have any symptoms? (e.g., blurred vision, chest pain,  difficulty breathing, headache, weakness)     Denies chest pain, diff breathing,  Dizziness, HA, left hand pain, cold hand, heartburn  Protocols used: Blood Pressure - High-A-AH

## 2024-05-22 ENCOUNTER — Encounter: Payer: Self-pay | Admitting: Physician Assistant

## 2024-05-22 ENCOUNTER — Ambulatory Visit (INDEPENDENT_AMBULATORY_CARE_PROVIDER_SITE_OTHER): Admitting: Physician Assistant

## 2024-05-22 VITALS — BP 102/62 | HR 93 | Temp 98.0°F | Resp 18 | Ht 66.0 in | Wt 220.4 lb

## 2024-05-22 DIAGNOSIS — E559 Vitamin D deficiency, unspecified: Secondary | ICD-10-CM

## 2024-05-22 DIAGNOSIS — N959 Unspecified menopausal and perimenopausal disorder: Secondary | ICD-10-CM

## 2024-05-22 DIAGNOSIS — E782 Mixed hyperlipidemia: Secondary | ICD-10-CM | POA: Diagnosis not present

## 2024-05-22 DIAGNOSIS — N3000 Acute cystitis without hematuria: Secondary | ICD-10-CM

## 2024-05-22 DIAGNOSIS — E1169 Type 2 diabetes mellitus with other specified complication: Secondary | ICD-10-CM

## 2024-05-22 DIAGNOSIS — Z1231 Encounter for screening mammogram for malignant neoplasm of breast: Secondary | ICD-10-CM

## 2024-05-22 DIAGNOSIS — G40309 Generalized idiopathic epilepsy and epileptic syndromes, not intractable, without status epilepticus: Secondary | ICD-10-CM | POA: Diagnosis not present

## 2024-05-22 DIAGNOSIS — Z794 Long term (current) use of insulin: Secondary | ICD-10-CM | POA: Diagnosis not present

## 2024-05-22 DIAGNOSIS — K2100 Gastro-esophageal reflux disease with esophagitis, without bleeding: Secondary | ICD-10-CM

## 2024-05-22 DIAGNOSIS — I10 Essential (primary) hypertension: Secondary | ICD-10-CM | POA: Diagnosis not present

## 2024-05-22 DIAGNOSIS — R600 Localized edema: Secondary | ICD-10-CM

## 2024-05-22 DIAGNOSIS — E119 Type 2 diabetes mellitus without complications: Secondary | ICD-10-CM

## 2024-05-22 LAB — POCT URINALYSIS DIP (CLINITEK)
Bilirubin, UA: NEGATIVE
Blood, UA: NEGATIVE
Glucose, UA: NEGATIVE mg/dL
Ketones, POC UA: NEGATIVE mg/dL
Leukocytes, UA: NEGATIVE
Nitrite, UA: NEGATIVE
POC PROTEIN,UA: NEGATIVE
Spec Grav, UA: 1.01
Urobilinogen, UA: 0.2 U/dL
pH, UA: 6.5

## 2024-05-22 MED ORDER — FLUTICASONE PROPIONATE 50 MCG/ACT NA SUSP: 2.0000 | Freq: Every day | NASAL | 1 refills | Status: DC

## 2024-05-22 NOTE — Progress Notes (Signed)
 "  Subjective:  Patient ID: Alicia Kelly, female    DOB: 08-04-1955  Age: 69 y.o. MRN: [id number]  Chief Complaint  Patient presents with   Follow-up - chronic   Urinary Tract Infection    HPI Alicia Kelly has a history of type 2 Diabetes Mellitus for more than 5 years. Current treatment includes ozempic  2mg  weekly , farxiga  5mg  qd and tresiba  45 units qd -   She denies hypoglycemic episodes. She states blood glucose levels at home range from 150s to 160s. She is trying to watch her diet but due to swelling in legs it is hard for her to exercise .   Mixed hyperlipidemia  Pt presents with hyperlipidemia. Compliance with treatment has been fair - The patient is compliant with medications, and currently taking lipitor 40mg  qd Tries to watch diet  Pt with diagnosis of generalized epilepsy - currently following with neurology and had a video visit recently - she states she has not had recent seizure activity since adjusting medications Taking vimpat  and keppra   Pt presents for follow up of hypertension.   Compliance with treatment has been good; hyzaar was stopped several months ago  She is also taking demadex  10mg  bid for leg edema  Pt with GERD - taking protonix  40mg  qd - states controls symptoms well  Pt with Vit D def - is taking weekly supplement - due for labwork  Pt is due for mammogram and dexa scan - would like to schedule  Pt continues to have swelling of lower legs with no weeping at this time and says overall better than usual at this time - currently not having to wear unna boots  Pt recently diagnosed with UTI at urgent care and just finished omnicef.  She is not having any symptoms today  Lab Results  Component Value Date   HGBA1C 8.8 (H) 03/30/2024   HGBA1C 8.8 (H) 12/16/2023   HGBA1C 8.5 (H) 09/02/2023        05/22/2024    1:43 PM 02/08/2024   10:00 AM 02/07/2024   10:42 AM 09/02/2023    9:57 AM 07/08/2023    2:04 PM  Depression screen PHQ 2/9   Decreased Interest 0 0 0 0 0  Down, Depressed, Hopeless 0 0 0 0 0  PHQ - 2 Score 0 0 0 0 0  Altered sleeping 0   0   Tired, decreased energy 0   0   Change in appetite 0   0   Feeling bad or failure about yourself  0   0   Trouble concentrating 0   0   Moving slowly or fidgety/restless 0   0   Suicidal thoughts 0   0   PHQ-9 Score 0   0    Difficult doing work/chores Not difficult at all   Not difficult at all      Data saved with a previous flowsheet row definition        10/28/2022   10:41 AM 02/02/2023   10:44 AM 07/08/2023    2:04 PM 09/02/2023    9:56 AM 05/22/2024    1:43 PM  Fall Risk  Falls in the past year? 1 1 1 1  0  Was there an injury with Fall? 1  1  0  0  0  Fall Risk Category Calculator 2 3 2 1  0  Patient at Risk for Falls Due to History of fall(s);No Fall Risks   No Fall Risks;History of fall(s) No Fall  Risks  Fall risk Follow up Falls evaluation completed Falls evaluation completed  Falls evaluation completed Falls evaluation completed     Data saved with a previous flowsheet row definition    CONSTITUTIONAL: Negative for chills, fatigue, fever, unintentional weight gain and unintentional weight loss.  E/N/T: Negative for ear pain, nasal congestion and sore throat.  CARDIOVASCULAR: Negative for chest pain, dizziness, palpitations - has chronic pedal edema RESPIRATORY: Negative for recent cough and dyspnea.  GASTROINTESTINAL: Negative for abdominal pain, acid reflux symptoms, constipation, diarrhea, nausea and vomiting.  GU - negative MSK: Negative for arthralgias and myalgias.  INTEGUMENTARY: Negative for rash.  NEUROLOGICAL: Negative for dizziness and headaches.  PSYCHIATRIC: Negative for sleep disturbance and to question depression screen.  Negative for depression, negative for anhedonia.     Office Visit on 05/22/2024  Component Date Value Ref Range Status   Color, UA 05/22/2024 yellow  yellow Final   Clarity, UA 05/22/2024 clear  clear Final   Glucose,  UA 05/22/2024 negative  negative mg/dL Final   Bilirubin, UA 98/94/7973 negative  negative Final   Ketones, POC UA 05/22/2024 negative  negative mg/dL Final   Spec Grav, UA 98/94/7973 1.010  1.010 - 1.025 Final   Blood, UA 05/22/2024 negative  negative Final   pH, UA 05/22/2024 6.5  5.0 - 8.0 Final   POC PROTEIN,UA 05/22/2024 negative  negative, trace Final   Urobilinogen, UA 05/22/2024 0.2  0.2 or 1.0 E.U./dL Final   Nitrite, UA 98/94/7973 Negative  Negative Final   Leukocytes, UA 05/22/2024 Negative  Negative Final      Current Outpatient Medications:    Accu-Chek Softclix Lancets lancets, TEST BLOOD SUGAR THREE TIMES DAILY, Disp: 300 each, Rfl: 1   atorvastatin  (LIPITOR) 40 MG tablet, TAKE 1 TABLET BY MOUTH EVERY DAY, Disp: 90 tablet, Rfl: 1   BD PEN NEEDLE MINI ULTRAFINE 31G X 5 MM MISC, USE 1 PEN NEEDLE DAILY, Disp: 100 each, Rfl: 2   Blood Glucose Calibration (ACCU-CHEK AVIVA) SOLN, , Disp: , Rfl:    Blood Glucose Monitoring Suppl (ACCU-CHEK AVIVA PLUS) w/Device KIT, 1 each by Does not apply route 3 (three) times daily., Disp: 1 kit, Rfl: 0   clotrimazole -betamethasone  (LOTRISONE ) cream, APPLY TO AFFECTED AREA TWICE A DAY, Disp: 30 g, Rfl: 2   dapagliflozin  propanediol (FARXIGA ) 5 MG TABS tablet, Take 1 tablet (5 mg total) by mouth daily before breakfast., Disp: 30 tablet, Rfl: 2   fluticasone  (FLONASE ) 50 MCG/ACT nasal spray, Place 2 sprays into both nostrils daily., Disp: , Rfl:    glucose blood (ACCU-CHEK GUIDE TEST) test strip, 1 each by Other route 3 (three) times daily. Use as instructed, Disp: 100 each, Rfl: 12   insulin degludec  (TRESIBA  FLEXTOUCH) 100 UNIT/ML FlexTouch Pen, INJECT 45 UNITS INTO THE SKIN DAILY, Disp: 15 mL, Rfl: 1   lacosamide  (VIMPAT ) 200 MG TABS tablet, Take 1.5 tablets (300 mg total) by mouth 2 (two) times daily., Disp: , Rfl:    levETIRAcetam  (KEPPRA ) 1000 MG tablet, Take 1,500 mg by mouth., Disp: , Rfl:    nystatin  (NYAMYC ) powder, Apply topically.,  Disp: , Rfl:    ondansetron  (ZOFRAN -ODT) 4 MG disintegrating tablet, TAKE 1 TABLET (4 MG TOTAL) BY MOUTH EVERY 8 (EIGHT) HOURS AS NEEDED FOR NAUSEA OR VOMITING. TAKE 1 TABLET BY MOUTH EVERY 8 HOURS AS NEEDED FOR NAUSEA AND VOMITING, Disp: 90 tablet, Rfl: 0   pantoprazole  (PROTONIX ) 40 MG tablet, TAKE 1 TABLET BY MOUTH TWICE A DAY, Disp: 180 tablet, Rfl:  1   Semaglutide , 2 MG/DOSE, (OZEMPIC , 2 MG/DOSE,) 8 MG/3ML SOPN, INJECT 2 MG INTO THE SKIN ONCE A WEEK., Disp: 3 mL, Rfl: 1   torsemide  (DEMADEX ) 10 MG tablet, Take 1 tablet (10 mg total) by mouth 2 (two) times daily., Disp: 180 tablet, Rfl: 1   Vitamin D , Ergocalciferol , (DRISDOL ) 1.25 MG (50000 UNIT) CAPS capsule, TAKE 1 CAPSULE BY MOUTH TWICE A WEEK, Disp: 24 capsule, Rfl: 1  Past Medical History:  Diagnosis Date   Asthma    Calculus of gallbladder with chronic cholecystitis without obstruction 03/22/2017   Diabetes (HCC)    Epilepsy (HCC)    Hypertension    Sleep apnea    Objective:  PHYSICAL EXAM:   VS: BP 102/62   Pulse 93   Temp 98 F (36.7 C) (Temporal)   Resp 18   Ht 5' 6 (1.676 m)   Wt 220 lb 6.4 oz (100 kg)   LMP  (LMP Unknown)   SpO2 99%   BMI 35.57 kg/m   GEN: Well nourished, well developed, in no acute distress  Cardiac: RRR; no murmurs, rubs, or gallops,no edema -  Respiratory:  normal respiratory rate and pattern with no distress - normal breath sounds with no rales, rhonchi, wheezes or rubs MS: no deformity or atrophy  Skin: warm and dry, no rash  Neuro:  Alert and Oriented x 3,  - CN II-Xii grossly intact Psych: euthymic mood, appropriate affect and demeanor  Office Visit on 05/22/2024  Component Date Value Ref Range Status   Color, UA 05/22/2024 yellow  yellow Final   Clarity, UA 05/22/2024 clear  clear Final   Glucose, UA 05/22/2024 negative  negative mg/dL Final   Bilirubin, UA 98/94/7973 negative  negative Final   Ketones, POC UA 05/22/2024 negative  negative mg/dL Final   Spec Grav, UA 98/94/7973  1.010  1.010 - 1.025 Final   Blood, UA 05/22/2024 negative  negative Final   pH, UA 05/22/2024 6.5  5.0 - 8.0 Final   POC PROTEIN,UA 05/22/2024 negative  negative, trace Final   Urobilinogen, UA 05/22/2024 0.2  0.2 or 1.0 E.U./dL Final   Nitrite, UA 98/94/7973 Negative  Negative Final   Leukocytes, UA 05/22/2024 Negative  Negative Final    Assessment & Plan:  Essential hypertension Stable off medication Watch diet/salt restrictions  Bilateral leg edema Continue torsemide   Vitamin D  deficiency -     VITAMIN D  25 Hydroxy (Vit-D Deficiency, Fractures) Labwork pending Insulin dependent type 2 diabetes mellitus (HCC) -     CBC with Differential/Platelet -     Comprehensive metabolic panel -     TSH -     Lipid panel -     Hemoglobin A1c Continue current meds Generalized epilepsy (HCC) Continue meds and follow up with neurology as directed Labwork pending Hyperlipidemia associated with type 2 diabetes mellitus (HCC) -     Comprehensive metabolic panel -     Lipid panel Continue med Gastroesophageal reflux disease with esophagitis without hemorrhage Continue med  Encounter for screening mammogram for breast cancer -     3D Screening Mammogram, Left and Right; Future  Other specified menopausal and perimenopausal disorders -     DG Bone Density; Future  UTI - resolved ua     Follow-up: Return in about 4 months (around 09/19/2024) for chronic fasting follow-up.  An After Visit Summary was printed and given to the patient.  CAMIE JONELLE NICHOLAUS DEVONNA [facility] [phone] "

## 2024-06-02 ENCOUNTER — Other Ambulatory Visit: Payer: Self-pay | Admitting: Physician Assistant

## 2024-06-02 DIAGNOSIS — E119 Type 2 diabetes mellitus without complications: Secondary | ICD-10-CM

## 2024-06-04 ENCOUNTER — Other Ambulatory Visit: Payer: Self-pay | Admitting: Physician Assistant

## 2024-06-11 ENCOUNTER — Other Ambulatory Visit: Payer: Self-pay | Admitting: Physician Assistant

## 2024-06-11 DIAGNOSIS — R600 Localized edema: Secondary | ICD-10-CM

## 2024-06-14 ENCOUNTER — Encounter (HOSPITAL_BASED_OUTPATIENT_CLINIC_OR_DEPARTMENT_OTHER): Payer: Self-pay

## 2024-06-16 ENCOUNTER — Other Ambulatory Visit: Payer: Self-pay | Admitting: Physician Assistant

## 2024-06-21 ENCOUNTER — Telehealth: Payer: Self-pay

## 2024-06-21 ENCOUNTER — Other Ambulatory Visit: Payer: Self-pay | Admitting: Physician Assistant

## 2024-06-21 DIAGNOSIS — E119 Type 2 diabetes mellitus without complications: Secondary | ICD-10-CM

## 2024-06-21 NOTE — Telephone Encounter (Signed)
 Called patient to let her know she is seeing Dr. Ezzard at [facility], unable to leave message due to VM being full  Copied from CRM #[id number]. Topic: Appointments - Scheduling Inquiry for Clinic >> Jun 20, 2024 12:09 PM Gustabo D wrote: March 6 , 2025 pt has a appt with [facility] and wants to know the name of the doc. This appt does not show on her profile please call her back with this info

## 2024-07-20 ENCOUNTER — Ambulatory Visit

## 2024-08-07 ENCOUNTER — Other Ambulatory Visit

## 2024-08-07 ENCOUNTER — Ambulatory Visit: Admitting: Oncology
